# Patient Record
Sex: Female | Born: 1935 | Race: White | Hispanic: No | Marital: Married | Smoking: Never smoker
Health system: Southern US, Community
[De-identification: ages and names within clinical notes are randomized; demographics above are authoritative.]

## PROBLEM LIST (undated history)

## (undated) DIAGNOSIS — I639 Cerebral infarction, unspecified: Secondary | ICD-10-CM

## (undated) DIAGNOSIS — K219 Gastro-esophageal reflux disease without esophagitis: Secondary | ICD-10-CM

## (undated) DIAGNOSIS — M545 Low back pain, unspecified: Secondary | ICD-10-CM

## (undated) DIAGNOSIS — I5022 Chronic systolic (congestive) heart failure: Secondary | ICD-10-CM

## (undated) DIAGNOSIS — E785 Hyperlipidemia, unspecified: Secondary | ICD-10-CM

## (undated) DIAGNOSIS — R300 Dysuria: Secondary | ICD-10-CM

## (undated) DIAGNOSIS — I471 Supraventricular tachycardia, unspecified: Secondary | ICD-10-CM

## (undated) DIAGNOSIS — I10 Essential (primary) hypertension: Secondary | ICD-10-CM

## (undated) DIAGNOSIS — F32A Depression, unspecified: Secondary | ICD-10-CM

## (undated) DIAGNOSIS — F0150 Vascular dementia without behavioral disturbance: Secondary | ICD-10-CM

## (undated) DIAGNOSIS — E7800 Pure hypercholesterolemia, unspecified: Secondary | ICD-10-CM

## (undated) DIAGNOSIS — I495 Sick sinus syndrome: Secondary | ICD-10-CM

## (undated) DIAGNOSIS — M1990 Unspecified osteoarthritis, unspecified site: Secondary | ICD-10-CM

## (undated) DIAGNOSIS — G459 Transient cerebral ischemic attack, unspecified: Secondary | ICD-10-CM

## (undated) DIAGNOSIS — F419 Anxiety disorder, unspecified: Secondary | ICD-10-CM

## (undated) DIAGNOSIS — I480 Paroxysmal atrial fibrillation: Secondary | ICD-10-CM

## (undated) DIAGNOSIS — E0590 Thyrotoxicosis, unspecified without thyrotoxic crisis or storm: Secondary | ICD-10-CM

## (undated) DIAGNOSIS — I2510 Atherosclerotic heart disease of native coronary artery without angina pectoris: Secondary | ICD-10-CM

## (undated) DIAGNOSIS — E663 Overweight: Secondary | ICD-10-CM

## (undated) DIAGNOSIS — I482 Chronic atrial fibrillation, unspecified: Secondary | ICD-10-CM

## (undated) DIAGNOSIS — R413 Other amnesia: Secondary | ICD-10-CM

## (undated) DIAGNOSIS — Z950 Presence of cardiac pacemaker: Secondary | ICD-10-CM

## (undated) DIAGNOSIS — R42 Dizziness and giddiness: Secondary | ICD-10-CM

## (undated) DIAGNOSIS — F329 Major depressive disorder, single episode, unspecified: Secondary | ICD-10-CM

## (undated) DIAGNOSIS — K644 Residual hemorrhoidal skin tags: Secondary | ICD-10-CM

## (undated) DIAGNOSIS — I428 Other cardiomyopathies: Secondary | ICD-10-CM

## (undated) HISTORY — DX: Low back pain: M54.5

## (undated) HISTORY — DX: Depression, unspecified: F32.A

## (undated) HISTORY — DX: Atherosclerotic heart disease of native coronary artery without angina pectoris: I25.10

## (undated) HISTORY — DX: Low back pain, unspecified: M54.50

## (undated) HISTORY — DX: Vascular dementia, unspecified severity, without behavioral disturbance, psychotic disturbance, mood disturbance, and anxiety: F01.50

## (undated) HISTORY — DX: Dysuria: R30.0

## (undated) HISTORY — DX: Gastro-esophageal reflux disease without esophagitis: K21.9

## (undated) HISTORY — DX: Presence of cardiac pacemaker: Z95.0

## (undated) HISTORY — DX: Essential (primary) hypertension: I10

## (undated) HISTORY — DX: Chronic systolic (congestive) heart failure: I50.22

## (undated) HISTORY — DX: Other amnesia: R41.3

## (undated) HISTORY — DX: Unspecified osteoarthritis, unspecified site: M19.90

## (undated) HISTORY — DX: Supraventricular tachycardia, unspecified: I47.10

## (undated) HISTORY — DX: Chronic atrial fibrillation, unspecified: I48.20

## (undated) HISTORY — DX: Cerebral infarction, unspecified: I63.9

## (undated) HISTORY — DX: Paroxysmal atrial fibrillation: I48.0

## (undated) HISTORY — PX: TOTAL ABDOMINAL HYSTERECTOMY W/ BILATERAL SALPINGOOPHORECTOMY: SHX83

## (undated) HISTORY — DX: Overweight: E66.3

## (undated) HISTORY — DX: Supraventricular tachycardia: I47.1

## (undated) HISTORY — DX: Residual hemorrhoidal skin tags: K64.4

## (undated) HISTORY — DX: Sick sinus syndrome: I49.5

## (undated) HISTORY — DX: Dizziness and giddiness: R42

## (undated) HISTORY — DX: Major depressive disorder, single episode, unspecified: F32.9

## (undated) HISTORY — DX: Hyperlipidemia, unspecified: E78.5

## (undated) HISTORY — DX: Pure hypercholesterolemia, unspecified: E78.00

## (undated) HISTORY — DX: Anxiety disorder, unspecified: F41.9

## (undated) HISTORY — DX: Other cardiomyopathies: I42.8

## (undated) HISTORY — DX: Thyrotoxicosis, unspecified without thyrotoxic crisis or storm: E05.90

## (undated) HISTORY — DX: Transient cerebral ischemic attack, unspecified: G45.9

---

## 1984-06-19 HISTORY — PX: ABDOMINAL HYSTERECTOMY: SHX81

## 1997-06-19 HISTORY — PX: OTHER SURGICAL HISTORY: SHX169

## 2000-06-19 HISTORY — PX: TOTAL KNEE ARTHROPLASTY: SHX125

## 2007-11-29 ENCOUNTER — Inpatient Hospital Stay (HOSPITAL_COMMUNITY): Admission: EM | Admit: 2007-11-29 | Discharge: 2007-12-05 | Payer: Self-pay | Admitting: Emergency Medicine

## 2007-12-02 ENCOUNTER — Ambulatory Visit: Payer: Self-pay | Admitting: Physical Medicine & Rehabilitation

## 2007-12-19 ENCOUNTER — Encounter: Admission: RE | Admit: 2007-12-19 | Discharge: 2007-12-19 | Payer: Self-pay | Admitting: Geriatric Medicine

## 2007-12-26 ENCOUNTER — Encounter: Admission: RE | Admit: 2007-12-26 | Discharge: 2007-12-26 | Payer: Self-pay | Admitting: Geriatric Medicine

## 2009-06-16 ENCOUNTER — Encounter: Admission: RE | Admit: 2009-06-16 | Discharge: 2009-06-16 | Payer: Self-pay | Admitting: Internal Medicine

## 2009-06-19 HISTORY — PX: PACEMAKER INSERTION: SHX728

## 2009-08-10 ENCOUNTER — Encounter (HOSPITAL_COMMUNITY)
Admission: RE | Admit: 2009-08-10 | Discharge: 2009-11-08 | Payer: Self-pay | Source: Home / Self Care | Admitting: Endocrinology

## 2009-10-06 ENCOUNTER — Encounter: Payer: Self-pay | Admitting: Endocrinology

## 2009-10-06 LAB — CONVERTED CEMR LAB
AST: 17 units/L
Albumin: 4.4 g/dL
Alkaline Phosphatase: 61 units/L
Basophils Relative: 0 %
CO2: 29 meq/L
Calcium: 9.3 mg/dL
Creatinine, Ser: 0.84 mg/dL
GFR calc Af Amer: 79 mL/min
GFR calc non Af Amer: 68 mL/min
Lymphocytes, automated: 3.4 %
MCV: 91 fL
Neutrophils Relative %: 3.7 %
Platelets: 277 10*3/uL
Potassium: 4.3 meq/L
RBC: 4.57 M/uL
Sodium: 142 meq/L
Triglyceride fasting, serum: 129 mg/dL
Vit D, 25-Hydroxy: 41.1 ng/mL
Vitamin B-12: 633 pg/mL
WBC: 7.8 10*3/uL

## 2009-10-12 ENCOUNTER — Ambulatory Visit: Payer: Self-pay | Admitting: Cardiovascular Disease

## 2009-10-12 ENCOUNTER — Inpatient Hospital Stay (HOSPITAL_COMMUNITY): Admission: EM | Admit: 2009-10-12 | Discharge: 2009-10-14 | Payer: Self-pay | Admitting: Emergency Medicine

## 2009-10-13 ENCOUNTER — Encounter (INDEPENDENT_AMBULATORY_CARE_PROVIDER_SITE_OTHER): Payer: Self-pay | Admitting: Internal Medicine

## 2009-10-13 ENCOUNTER — Ambulatory Visit: Payer: Self-pay | Admitting: Vascular Surgery

## 2009-10-19 ENCOUNTER — Encounter: Admission: RE | Admit: 2009-10-19 | Discharge: 2009-10-19 | Payer: Self-pay | Admitting: Internal Medicine

## 2009-11-04 ENCOUNTER — Inpatient Hospital Stay (HOSPITAL_COMMUNITY): Admission: EM | Admit: 2009-11-04 | Discharge: 2009-11-12 | Payer: Self-pay | Admitting: Emergency Medicine

## 2009-11-04 ENCOUNTER — Ambulatory Visit: Payer: Self-pay | Admitting: Cardiology

## 2009-11-10 ENCOUNTER — Encounter: Payer: Self-pay | Admitting: Internal Medicine

## 2009-11-22 ENCOUNTER — Encounter: Payer: Self-pay | Admitting: Internal Medicine

## 2009-11-24 ENCOUNTER — Encounter: Payer: Self-pay | Admitting: Internal Medicine

## 2009-11-24 ENCOUNTER — Ambulatory Visit: Payer: Self-pay | Admitting: Internal Medicine

## 2010-01-13 ENCOUNTER — Encounter (HOSPITAL_COMMUNITY): Admission: RE | Admit: 2010-01-13 | Discharge: 2010-03-18 | Payer: Self-pay | Admitting: Cardiology

## 2010-03-09 ENCOUNTER — Ambulatory Visit: Payer: Self-pay | Admitting: Cardiology

## 2010-03-19 ENCOUNTER — Encounter (HOSPITAL_COMMUNITY): Admission: RE | Admit: 2010-03-19 | Discharge: 2010-05-18 | Payer: Self-pay | Admitting: Cardiology

## 2010-05-05 ENCOUNTER — Encounter: Payer: Self-pay | Admitting: Endocrinology

## 2010-05-11 ENCOUNTER — Encounter: Payer: Self-pay | Admitting: Endocrinology

## 2010-05-18 ENCOUNTER — Encounter: Payer: Self-pay | Admitting: Endocrinology

## 2010-05-20 ENCOUNTER — Ambulatory Visit: Payer: Self-pay | Admitting: Endocrinology

## 2010-05-20 DIAGNOSIS — M545 Low back pain: Secondary | ICD-10-CM

## 2010-05-20 DIAGNOSIS — E7800 Pure hypercholesterolemia, unspecified: Secondary | ICD-10-CM | POA: Insufficient documentation

## 2010-05-20 DIAGNOSIS — F329 Major depressive disorder, single episode, unspecified: Secondary | ICD-10-CM

## 2010-05-20 DIAGNOSIS — R413 Other amnesia: Secondary | ICD-10-CM | POA: Insufficient documentation

## 2010-05-20 DIAGNOSIS — Z950 Presence of cardiac pacemaker: Secondary | ICD-10-CM | POA: Insufficient documentation

## 2010-05-20 DIAGNOSIS — M810 Age-related osteoporosis without current pathological fracture: Secondary | ICD-10-CM | POA: Insufficient documentation

## 2010-05-20 DIAGNOSIS — N39498 Other specified urinary incontinence: Secondary | ICD-10-CM

## 2010-05-20 DIAGNOSIS — I6350 Cerebral infarction due to unspecified occlusion or stenosis of unspecified cerebral artery: Secondary | ICD-10-CM | POA: Insufficient documentation

## 2010-05-20 DIAGNOSIS — F3289 Other specified depressive episodes: Secondary | ICD-10-CM | POA: Insufficient documentation

## 2010-05-20 DIAGNOSIS — E0590 Thyrotoxicosis, unspecified without thyrotoxic crisis or storm: Secondary | ICD-10-CM | POA: Insufficient documentation

## 2010-05-20 DIAGNOSIS — I10 Essential (primary) hypertension: Secondary | ICD-10-CM | POA: Insufficient documentation

## 2010-05-23 ENCOUNTER — Inpatient Hospital Stay (HOSPITAL_COMMUNITY)
Admission: RE | Admit: 2010-05-23 | Discharge: 2010-05-26 | Payer: Self-pay | Source: Home / Self Care | Attending: Cardiovascular Disease | Admitting: Cardiovascular Disease

## 2010-05-23 HISTORY — PX: CARDIAC CATHETERIZATION: SHX172

## 2010-06-02 ENCOUNTER — Ambulatory Visit: Payer: Self-pay | Admitting: Cardiology

## 2010-06-16 ENCOUNTER — Ambulatory Visit: Payer: Self-pay | Admitting: Cardiology

## 2010-07-19 NOTE — Miscellaneous (Signed)
Summary: Device preload  Clinical Lists Changes  Observations: Added new observation of PPM INDICATN: Sick sinus syndrome (11/22/2009 12:14) Added new observation of MAGNET RTE: BOL 85 ERI  65 (11/22/2009 12:14) Added new observation of PPMLEADSTAT2: active (11/22/2009 12:14) Added new observation of PPMLEADSER2: [id number] (11/22/2009 12:14) Added new observation of PPMLEADMOD2: 5092  (11/22/2009 12:14) Added new observation of PPMLEADLOC2: RV  (11/22/2009 12:14) Added new observation of PPMLEADSTAT1: active  (11/22/2009 12:14) Added new observation of PPMLEADSER1: [id number]  (11/22/2009 12:14) Added new observation of PPMLEADMOD1: 5076  (11/22/2009 12:14) Added new observation of PPMLEADLOC1: RA  (11/22/2009 12:14) Added new observation of PPM IMP MD: Hillis Range, MD  (11/22/2009 12:14) Added new observation of PPMLEADDOI2: 11/10/2009  (11/22/2009 12:14) Added new observation of PPMLEADDOI1: 11/10/2009  (11/22/2009 12:14) Added new observation of PPM DOI: 11/10/2009  (11/22/2009 12:14) Added new observation of PPM SERL#: [id number]  (11/22/2009 12:14) Added new observation of PPM MODL#: ADDRL1  (11/22/2009 96:29) Added new observation of PACEMAKERMFG: Medtronic  (11/22/2009 12:14) Added new observation of PACEMAKER MD: Hillis Range, MD  (11/22/2009 12:14)      PPM Specifications Following MD:  Hillis Range, MD     PPM Vendor:  Medtronic     PPM Model Number:  ADDRL1     PPM Serial Number:  [id number] PPM DOI:  11/10/2009     PPM Implanting MD:  Hillis Range, MD  Lead 1    Location: RA     DOI: 11/10/2009     Model #: [id number]     Serial #: [id number]     Status: active Lead 2    Location: RV     DOI: 11/10/2009     Model #: [id number]     Serial #: [id number]     Status: active  Magnet Response Rate:  BOL 85 ERI  65  Indications:  Sick sinus syndrome

## 2010-07-19 NOTE — Letter (Signed)
Summary: Medication & Problem List/[facility]  Medication & Problem List/[facility]   Imported By: Sherian Rein 05/25/2010 11:23:57  _____________________________________________________________________  External Attachment:    Type:   Image     Comment:   External Document

## 2010-07-19 NOTE — Cardiovascular Report (Signed)
Summary: Office Visit   Office Visit   Imported By: Roderic Ovens 12/10/2009 09:52:45  _____________________________________________________________________  External Attachment:    Type:   Image     Comment:   External Document

## 2010-07-19 NOTE — Letter (Signed)
Summary: [facility]   Imported By: Sherian Rein 05/25/2010 11:25:01  _____________________________________________________________________  External Attachment:    Type:   Image     Comment:   External Document

## 2010-07-19 NOTE — Procedures (Signed)
Summary: wch/ gd   PPM Specifications Following MD:  Hillis Range, MD     PPM Vendor:  Medtronic     PPM Model Number:  ADDRL1     PPM Serial Number:  [id number] PPM DOI:  11/10/2009     PPM Implanting MD:  Hillis Range, MD  Lead 1    Location: RA     DOI: 11/10/2009     Model #: [id number]     Serial #: [id number]     Status: active Lead 2    Location: RV     DOI: 11/10/2009     Model #: [id number]     Serial #: [id number]     Status: active  Magnet Response Rate:  BOL 85 ERI  65  Indications:  Sick sinus syndrome   PPM Follow Up Remote Check?  No Battery Voltage:  2.79 V     Battery Est. Longevity:  11.5 YEARS     Pacer Dependent:  No       PPM Device Measurements Atrium  Amplitude: 2.8 mV, Impedance: 485 ohms, Threshold: 0.625 V at 0.4 msec Right Ventricle  Amplitude: 11.2 mV, Impedance: 634 ohms, Threshold: 0.5 V at 0.4 msec  Episodes MS Episodes:  7     Percent Mode Switch:  <0.1%     Ventricular High Rate:  0     Atrial Pacing:  70.9%     Ventricular Pacing:  0.9%  Parameters Mode:  MVP (R)     Lower Rate Limit:  60     Upper Rate Limit:  130 Paced AV Delay:  150     Sensed AV Delay:  120 Next Cardiology Appt Due:  02/17/2010 Tech Comments:  Wound check appt.  Steri-strips removed.  Wound without redness or edema.  Normal device function.  Longest mode switch episode 12 minutes with relatively well controlled ventricular rate.  No changes made today.  ROV 3 months JA. Gypsy Balsam RN BSN  November 29, 2009 11:43 AM

## 2010-07-19 NOTE — Assessment & Plan Note (Signed)
Summary: New Endo/low thyroid/Patel/Medicare/cd   Vital Signs:  Patient profile:   77 year old female Height:      62 inches (157.48 cm) Weight:      166 pounds (75.45 kg) BMI:     30.47 O2 Sat:      98 % on Room air Temp:     97.8 degrees F (36.56 degrees C) oral Pulse rate:   84 / minute Pulse rhythm:   regular BP sitting:   110 / 62  (left arm) Cuff size:   regular  Vitals Entered By: Brenton Grills CMA Duncan Dull) (May 20, 2010 2:38 PM)  O2 Flow:  Room air CC: New Endo Consult/Low Thyroid Levels/Dr. Allena Katz Is Patient Diabetic? No   Referring Provider:  [name] MD Primary Provider:  [name] MD  CC:  New Endo Consult/Low Thyroid Levels/Dr. Allena Katz.  History of Present Illness: pt was dx'ed with hyperthyroidism in early 2011.  she was rx'ed with tapazole, x approx 4 months.  this was stopped approx june, 2011, due to improvements in her labs.  she has been off the medication since then.  she does not want to have i-131 therapy. son has dpahc, and wants to honor her wishes. symptomatically, pt has 3 years of slight tremor of the hands, and assoc fatigue.  Current Medications (verified): 1)  Lisinopril 20 Mg Tabs (Lisinopril) .Marland Kitchen.. 1 Tablet By Mouth Once Daily 2)  Simvastatin 80 Mg Tabs (Simvastatin) .Marland Kitchen.. 1 Tablet By Mouth Once Daily 3)  Spironolactone 25 Mg Tabs (Spironolactone) .Marland Kitchen.. 1 Tablet By Mouth Once Daily 4)  Metoprolol Tartrate 50 Mg Tabs (Metoprolol Tartrate) .Marland Kitchen.. 1 Tablet By Mouth Two Times A Day 5)  Furosemide 40 Mg Tabs (Furosemide) .Marland Kitchen.. 1 Tablet By Mouth Once Daily 6)  Isosorbide Mononitrate Cr 60 Mg Xr24h-Tab (Isosorbide Mononitrate) .Marland Kitchen.. 1 By Mouth Once Daily 7)  Calcium Carbonate-Vitamin D 600-400 Mg-Unit Tabs (Calcium Carbonate-Vitamin D) .Marland Kitchen.. 1 Tablet By Mouth Two Times A Day 8)  Alendronate Sodium 70 Mg Tabs (Alendronate Sodium) .Marland Kitchen.. 1 Tablet By Mouth Every Week 9)  Nitrostat 0.4 Mg Subl (Nitroglycerin) .Marland Kitchen.. 1 Tablet Under The Tongue As Needed For  Chest Pain 10)  Polyethylene Glycol 3350  Powd (Polyethylene Glycol 3350) .... 1/2 Capful With 8oz of Water As Needed For Constipation 11)  Aspirin 81 Mg Tbec (Aspirin) .Marland Kitchen.. 1 By Mouth Once Daily 12)  Centrum Silver  Tabs (Multiple Vitamins-Minerals) .Marland Kitchen.. 1 Tablet By Mouth Once Daily 13)  Vitamin C 500 Mg Tabs (Ascorbic Acid) .Marland Kitchen.. 1 Tablet By Mouth Once Daily 14)  Plavix 75 Mg Tabs (Clopidogrel Bisulfate) .Marland Kitchen.. 1 Tablet By Mouth Once Daily  Allergies (verified): 1)  ! * Hazelnuts 2)  ! Iodine 3)  ! * Contrast Dye 4)  ! * Peanuts  Past History:  Past Medical History: BACK PAIN, LUMBAR (ICD-724.2) OTHER URINARY INCONTINENCE (ICD-788.39) MEMORY LOSS (ICD-780.93) DEPRESSION (ICD-311) HYPERTENSION (ICD-401.9) CVA (ICD-434.91) CHF (ICD-428.0) CAD (ICD-414.00) PACEMAKER, PERMANENT (ICD-V45.01) OSTEOPOROSIS (ICD-733.00) HYPERCHOLESTEROLEMIA (ICD-272.0) HYPERTHYROIDISM (ICD-242.90) FAMILY HISTORY DIABETES 1ST DEGREE RELATIVE (ICD-V18.0)  Past Surgical History: Pacemaker: 2011 Total Knee Arthroplasty-Bilateral-2002 Hysterectomy-1986  Family History: Family History of Arthritis Family History Diabetes 1st degree relative Family History Hypertension dtr has uncertain type of thyroid problem  Social History: Reviewed history and no changes required. married retired lives with husband, son and his family  Review of Systems       denies weight loss, headache, hoarseness, double vision, palpitations, sob, diarrhea, polyuria, myalgias, excessive diaphoresis, numbness, seizure, anxiety, hypoglycemia,  and rhinorrhea.  she attributes easy bruising to asa and plavix  Physical Exam  General:  normal appearance.   Head:  head: no deformity eyes: no periorbital swelling, no proptosis external nose and ears are normal mouth: no lesion seen Neck:  thyroid is not enlarged.  no palpable nodule Chest Wall:  there is moderate kyphosis Lungs:  Clear to auscultation bilaterally. Normal  respiratory effort.  Heart:  Regular rate and rhythm without murmurs or gallops noted. Normal S1,S2.   Msk:  muscle bulk and strength are grossly normal.  no obvious joint swelling.  gait is slow, but steady. with a cane Extremities:  no deformity, except oa changes no edema Neurologic:  cn 2-12 grossly intact.   readily moves all 4's.   sensation is intact to touch on the feet there is a slight tremor of the hands  Skin:  normal texture and temp.  no rash.  not diaphoretic  Cervical Nodes:  No significant adenopathy.  Psych:  Alert and cooperative; normal mood and affect; normal attention span and concentration.   Additional Exam:  outside test results are reviewed:  fewb, 2011: THYROID SCAN AND UPTAKE - 24 HOURS 1.  Lower limits of normal 24 hour I-131 uptake. 2.  Normal thyroid scan.  may, 2011: Thyroid Stimulating Hormone (TSH)        0.335    nov, 2011: tsh=0.19   Impression & Recommendations:  Problem # 1:  HYPERTHYROIDISM (ICD-242.90) prob due to small multinodular goiter pt declines i-131 rx.    Problem # 2:  MEMORY LOSS (ICD-780.93) this limits eval of sxs  Problem # 3:  tremor caused by, or exac by #1  Medications Added to Medication List This Visit: 1)  Plavix 75 Mg Tabs (Clopidogrel bisulfate) .Marland Kitchen.. 1 tablet by mouth once daily 2)  Methimazole 5 Mg Tabs (Methimazole) .Marland Kitchen.. 1 tab three times weekly (m, w, f)  Other Orders: New Patient Level IV (40347)  Patient Instructions: 1)  resume methimazole at 5 mg three times weekly (m, w, f). 2)  if ever you have fever while taking this medication, stop it and call us, because of the risk of a rare side-effect. 3)  Please schedule a follow-up appointment in 3 months. Prescriptions: METHIMAZOLE 5 MG TABS (METHIMAZOLE) 1 tab three times weekly (m, w, f)  #15 x 4   Entered and Authorized by:   Minus Breeding MD   Signed by:   Minus Breeding MD on 05/20/2010   Method used:   Electronically to        [facility] (retail)       [address]       [place], [place]  [address]       Ph: [phone] or [phone]       Fax: [phone]   RxID:   [id number]    Orders Added: 1)  New Patient Level IV [23762]   Immunization History:  Tetanus/Td Immunization History:    Tetanus/Td:  historical (06/19/2001)  Pneumovax Immunization History:    Pneumovax:  historical (06/19/2001)   Immunization History:  Tetanus/Td Immunization History:    Tetanus/Td:  Historical (06/19/2001)  Pneumovax Immunization History:    Pneumovax:  Historical (06/19/2001)  Preventive Care Screening  Colonoscopy:    Date:  06/19/2001    Results:  normal

## 2010-08-20 ENCOUNTER — Emergency Department (HOSPITAL_COMMUNITY): Payer: Medicare Other

## 2010-08-20 ENCOUNTER — Inpatient Hospital Stay (HOSPITAL_COMMUNITY)
Admission: EM | Admit: 2010-08-20 | Discharge: 2010-08-25 | DRG: 481 | Disposition: A | Discharge status: Skilled Nursing Facility | Payer: Medicare Other | Attending: Internal Medicine | Admitting: Internal Medicine

## 2010-08-20 DIAGNOSIS — I10 Essential (primary) hypertension: Secondary | ICD-10-CM | POA: Diagnosis present

## 2010-08-20 DIAGNOSIS — Z950 Presence of cardiac pacemaker: Secondary | ICD-10-CM

## 2010-08-20 DIAGNOSIS — I252 Old myocardial infarction: Secondary | ICD-10-CM

## 2010-08-20 DIAGNOSIS — S72009A Fracture of unspecified part of neck of unspecified femur, initial encounter for closed fracture: Secondary | ICD-10-CM

## 2010-08-20 DIAGNOSIS — I5032 Chronic diastolic (congestive) heart failure: Secondary | ICD-10-CM | POA: Diagnosis present

## 2010-08-20 DIAGNOSIS — I509 Heart failure, unspecified: Secondary | ICD-10-CM | POA: Diagnosis present

## 2010-08-20 DIAGNOSIS — D62 Acute posthemorrhagic anemia: Secondary | ICD-10-CM | POA: Diagnosis not present

## 2010-08-20 DIAGNOSIS — W010XXA Fall on same level from slipping, tripping and stumbling without subsequent striking against object, initial encounter: Secondary | ICD-10-CM | POA: Diagnosis present

## 2010-08-20 DIAGNOSIS — I2510 Atherosclerotic heart disease of native coronary artery without angina pectoris: Secondary | ICD-10-CM | POA: Diagnosis present

## 2010-08-20 DIAGNOSIS — M810 Age-related osteoporosis without current pathological fracture: Secondary | ICD-10-CM | POA: Diagnosis present

## 2010-08-20 DIAGNOSIS — Y9210 Unspecified residential institution as the place of occurrence of the external cause: Secondary | ICD-10-CM | POA: Diagnosis present

## 2010-08-20 DIAGNOSIS — Z8673 Personal history of transient ischemic attack (TIA), and cerebral infarction without residual deficits: Secondary | ICD-10-CM

## 2010-08-20 DIAGNOSIS — S72143A Displaced intertrochanteric fracture of unspecified femur, initial encounter for closed fracture: Principal | ICD-10-CM | POA: Diagnosis present

## 2010-08-20 DIAGNOSIS — I2119 ST elevation (STEMI) myocardial infarction involving other coronary artery of inferior wall: Secondary | ICD-10-CM

## 2010-08-20 DIAGNOSIS — E0590 Thyrotoxicosis, unspecified without thyrotoxic crisis or storm: Secondary | ICD-10-CM | POA: Diagnosis present

## 2010-08-20 DIAGNOSIS — Z9861 Coronary angioplasty status: Secondary | ICD-10-CM

## 2010-08-20 LAB — CBC
HCT: 36.4 % (ref 36.0–46.0)
MCH: 29.7 pg (ref 26.0–34.0)
MCHC: 32.7 g/dL (ref 30.0–36.0)
MCV: 90.8 fL (ref 78.0–100.0)
RDW: 13.7 % (ref 11.5–15.5)

## 2010-08-20 LAB — BASIC METABOLIC PANEL
BUN: 17 mg/dL (ref 6–23)
CO2: 28 mEq/L (ref 19–32)
Chloride: 105 mEq/L (ref 96–112)
Glucose, Bld: 149 mg/dL — ABNORMAL HIGH (ref 70–99)
Potassium: 4 mEq/L (ref 3.5–5.1)

## 2010-08-20 LAB — DIFFERENTIAL
Basophils Absolute: 0 10*3/uL (ref 0.0–0.1)
Eosinophils Relative: 2 % (ref 0–5)
Lymphocytes Relative: 38 % (ref 12–46)
Lymphs Abs: 2.9 10*3/uL (ref 0.7–4.0)
Monocytes Absolute: 0.4 10*3/uL (ref 0.1–1.0)
Monocytes Relative: 6 % (ref 3–12)

## 2010-08-20 LAB — URINALYSIS, ROUTINE W REFLEX MICROSCOPIC
Bilirubin Urine: NEGATIVE
Nitrite: NEGATIVE
Protein, ur: NEGATIVE mg/dL
Specific Gravity, Urine: 1.017 (ref 1.005–1.030)
Urobilinogen, UA: 0.2 mg/dL (ref 0.0–1.0)

## 2010-08-20 LAB — TSH: TSH: 0.198 u[IU]/mL — ABNORMAL LOW (ref 0.350–4.500)

## 2010-08-21 ENCOUNTER — Inpatient Hospital Stay (HOSPITAL_COMMUNITY): Payer: Medicare Other

## 2010-08-21 DIAGNOSIS — I2510 Atherosclerotic heart disease of native coronary artery without angina pectoris: Secondary | ICD-10-CM

## 2010-08-21 LAB — URINE CULTURE
Colony Count: NO GROWTH
Culture  Setup Time: 201203031904
Culture: NO GROWTH

## 2010-08-21 LAB — CBC
HCT: 31 % — ABNORMAL LOW (ref 36.0–46.0)
Hemoglobin: 9.8 g/dL — ABNORMAL LOW (ref 12.0–15.0)
MCH: 29.6 pg (ref 26.0–34.0)
MCHC: 31.9 g/dL (ref 30.0–36.0)
MCV: 92.5 fL (ref 78.0–100.0)
RDW: 14 % (ref 11.5–15.5)

## 2010-08-21 LAB — BASIC METABOLIC PANEL
BUN: 12 mg/dL (ref 6–23)
CO2: 29 mEq/L (ref 19–32)
Chloride: 108 mEq/L (ref 96–112)
Creatinine, Ser: 0.93 mg/dL (ref 0.4–1.2)
Glucose, Bld: 109 mg/dL — ABNORMAL HIGH (ref 70–99)
Potassium: 4.5 mEq/L (ref 3.5–5.1)

## 2010-08-21 LAB — SURGICAL PCR SCREEN: MRSA, PCR: NEGATIVE

## 2010-08-22 LAB — CBC
HCT: 27 % — ABNORMAL LOW (ref 36.0–46.0)
MCHC: 32.6 g/dL (ref 30.0–36.0)
Platelets: 128 10*3/uL — ABNORMAL LOW (ref 150–400)
RDW: 14.8 % (ref 11.5–15.5)
WBC: 9.6 10*3/uL (ref 4.0–10.5)

## 2010-08-22 LAB — POCT I-STAT 4, (NA,K, GLUC, HGB,HCT)
Glucose, Bld: 138 mg/dL — ABNORMAL HIGH (ref 70–99)
HCT: 29 % — ABNORMAL LOW (ref 36.0–46.0)
Hemoglobin: 9.9 g/dL — ABNORMAL LOW (ref 12.0–15.0)
Potassium: 4.5 mEq/L (ref 3.5–5.1)
Sodium: 141 mEq/L (ref 135–145)

## 2010-08-22 LAB — BASIC METABOLIC PANEL
Calcium: 7.8 mg/dL — ABNORMAL LOW (ref 8.4–10.5)
GFR calc non Af Amer: 59 mL/min — ABNORMAL LOW (ref >60)
Glucose, Bld: 131 mg/dL — ABNORMAL HIGH (ref 70–99)
Potassium: 4 mEq/L (ref 3.5–5.1)
Sodium: 139 mEq/L (ref 135–145)

## 2010-08-22 NOTE — Consult Note (Signed)
NAMELEATHA, ROHNER              ACCOUNT NO.:  [id number]  MEDICAL RECORD NO.:  [id number]           PATIENT TYPE:  E  LOCATION:  WLED                         FACILITY:  [facility]  PHYSICIAN:  Eulas Post, MD    DATE OF BIRTH:  09/16/1931  DATE OF CONSULTATION:  08/20/2010 DATE OF DISCHARGE:                                CONSULTATION   TIME OF CONSULTATION:  10 a.m.  CHIEF COMPLAINT:  Left hip pain.  HISTORY:  [name] is a 77 year old woman who apparently had a fall today and injured her left hip.  She was found on the floor by her son and was unable to get up.  It is not clear exactly what happened. The thought was that it was a mechanical fall, but her son indicates that she is somewhat of a poor historian and often does not tell the full story.  He is concerned because she has had multiple episodes of mini strokes in the past and questionable blackout.  She complains of acute severe pain directly over the left hip that is worse with movement.  She has no other injuries.  She herself denies that she had any loss of consciousness.  Rest makes it better and pain medication makes it better and activity makes it worse.  PAST HISTORY:  Significant for cardiac disease and in fact has had a myocardial infarction with multiple stents placed in 2011.  She also has a history of congestive failure as well as hypertension, atrial flutter, stroke and hypothyroid disease as well as osteoporosis and is currently taking calcium with vitamin D.  She also has a history of depression.  FAMILY HISTORY:  Substantial for coronary disease with multiple early deaths in the 22s and 63s age range.  SOCIAL HISTORY:  She lives at home with her family and her husband and son and does not smoke and does not drink.  REVIEW OF SYSTEMS:  Complete review of systems was performed and was positive for recent fatigue, but she specifically denies chest pain or recent shortness of breath and  complete review of systems was performed and was otherwise negative with the exception of those mentioned in the history of present illness.  PHYSICAL EXAMINATION:  GENERAL:  She is alert and oriented and is appropriate with me throughout the interaction.  She is well developed, well nourished with mild obesity. HEENT:  She is normocephalic and atraumatic, and I do not see any evidence for head trauma. NECK:  Her trachea is midline, and she has a supple neck with no radiculopathy and no thyromegaly. LUNGS:  She has no cyanosis and no clubbing.  She has good perfusion throughout. CARDIOVASCULAR:  She has no significant pedal edema. GI:  Her abdomen is soft, nontender, nondistended with no rebound or guarding.  She has no hepatosplenomegaly. SKIN:  She has some bruising over her left hip but otherwise her skin exam is normal.  She does have some bruising over her left elbow as well.  There is no cellulitis or evidence of infection. PSYCHIATRIC:  She is appropriate with me throughout the interaction although she does speak [place]  primarily and so we have somewhat of a language barrier, although I was able to communicate with her in Spanish, as well as through her son who is functioning as a Nurse, learning disability. MUSCULOSKELETAL:  Her left hip has moderate deformity with shortening and external rotation.  EHL and FHL are firing.  Her right lower extremity has no evidence for trauma and neither of her upper extremities have any pain to palpation. NEUROLOGIC:  Her sensation is grossly intact throughout both of her lower extremities.  X-rays demonstrate a displaced left intertrochanteric hip fracture.  IMPRESSION:  Unstable displaced left intertrochanteric hip fracture in the setting of recent myocardial infarction with stent placement and multiple other medical comorbidities.  PLAN:  This is an acute severe injury that is going to substantially impact her quality of life.  I have discussed  nonsurgical versus surgical options with the patient and the family, and they have elected for surgical intervention.  She is going to be optimized by the Medical Service and admitted to the [facility] probably with a cardiac consult and preoperative optimization prior to proceeding.  She does take aspirin and Plavix for her stent, and I would recommend that she continue with these and will also need appropriate DVT prophylaxis which would probably be in the form of Lovenox which we will begin postoperatively after 24 hours.  I will defer the management of this to the Medical Service but would recommend at least 3-4 weeks of postoperative DVT prophylaxis.  We can continue with aspirin and Plavix or whatever anticoagulation is indicated based on Cardiology's recommendations.  I have ordered type and cross, and she will likely need blood during her hospitalization given the severe nature of her fracture.  The nature of this injury carries a very high risk for both morbidity as well as mortality and hopefully we will be able to get her back to an ambulatory function and relative independence, although she will certainly need short-term skilled nursing placement.  We are going to plan to have her optimized and to proceed with surgery probably tomorrow if all is in order.  Thank you for this consultation.     Eulas Post, MD     JPL/MEDQ  D:  08/20/2010  T:  08/20/2010  Job:  [id number]  cc:   Colleen Can. Deborah Chalk, M.D. Fax: [phone]  [name], MD  Electronically Signed by Teryl Lucy MD on 08/22/2010 08:30:29 AM

## 2010-08-22 NOTE — Consult Note (Signed)
[name], [name]              ACCOUNT NO.:  [id number]  MEDICAL RECORD NO.:  [id number]           PATIENT TYPE:  E  LOCATION:  WLED                         FACILITY:  [facility]  PHYSICIAN:  Cassell Clement, M.D. DATE OF BIRTH:  09/23/1931  DATE OF CONSULTATION:  08/20/2010 DATE OF DISCHARGE:                                CONSULTATION   CARDIOLOGIST:  Colleen Can. Deborah Chalk, M.D.  PRIMARY CARE PHYSICIAN:  [name], M.D.  Thank you for asking [facility] to see this pleasant 77 year old [place] speaking woman in consultation.  She is a patient of Dr. Roger Shelter for Cardiology.  She has a past history of tachybrady syndrome and underwent implantation of a Medtronic dual-chamber pacemaker in May 2011 by Dr. Hillis Range.  She has a history of ischemic heart disease, and on May 24, 2010, presented with an acute inferior wall myocardial infarction Code STEMI and was taken emergently to the lab where she was found to have a totally occluded right coronary artery with heavy clot burden.  Dr. Tonny Bollman inserted 3 drug-eluting stents into the right coronary artery.  Since that time, the patient has remained on aspirin 325 mg daily and Plavix 75 mg daily.  She has not been experiencing any recent chest pain or angina.  She has a past history of diastolic congestive heart failure but has had no recent symptoms of acute dyspnea or CHF.  Today, she fell and fractured her left hip and comes to the emergency room.  PAST MEDICAL HISTORY: 1. Prior history of cerebrovascular accident. 2. Hyperthyroidism. 3. Chronic congestive diastolic heart failure. 4. Anxiety and depression. 5. Osteoporosis and degenerative joint disease. 6. Tachybrady syndrome as noted above.  ALLERGIES: 1. IODINE. 2. CONTRAST MEDIA. 3. IVP DYE.  SOCIAL HISTORY:  She does not use alcohol or tobacco.  She speaks very little [place].  She lives with her husband and son.  REVIEW OF SYSTEMS:   She denies any cough or sputum production.  She is not having any change in bowel habits.  She does tend to be constipated but does not take MiraLax or stool softeners on a regular basis.  She denies any dysuria.  She does have urinary frequency.  All other systems negative in detail.  CURRENT MEDICATIONS: 1. Methadone 5 mg one 3 times a day. 2. Lisinopril 20 mg twice a day. 3. Plavix 75 mg daily. 4. Simvastatin 40 mg daily. 5. Spironolactone 25 mg daily. 6. Furosemide 40 mg daily. 7. Metoprolol 25 mg b.i.d. 8. Aspirin 325 mg daily.  PHYSICAL EXAMINATION:  VITAL SIGNS:  Her blood pressure is 100/60, pulse is 75, and respirations are 18. GENERAL:  This is an elderly Caucasian female lying flat in no distress. She is slightly pale. SKIN:  Warm and dry. PSYCH:  She is alert and oriented.  She is able to communicate somewhat and her family also helps interpret from the [place]. NECK:  Jugular venous pressure is normal.  Carotids are normal.  Thyroid not enlarged.  There is no lymphadenopathy. CHEST:  Clear to percussion and auscultation. HEART:  Reveals a quiet precordium without murmur, gallop, rub, or  click. ABDOMEN:  Reveals no hepatosplenomegaly or masses.  Bowel sounds are present. EXTREMITIES:  Show no phlebitis or edema.  She has good pedal pulses. The left leg is foreshortened and externally rotated.  RADIOLOGY:  Chest x-ray shows cardiomegaly, low lung volumes, no evidence of acute cardiopulmonary disease or CHF.  DIAGNOSTIC STUDIES:  Her electrocardiogram shows AV paced rhythm.  IMPRESSION: 1. Ischemic heart disease, status post Code STEMI in May 24, 2010, with emergent cardiac cath and insertion of 3 drug-eluting     stents to the right coronary artery. 2. History of tachybrady syndrome with paroxysmal atrial flutter and     with a functioning dual-chamber pacemaker.  The patient is     presently in normal sinus rhythm. 3. History of diastolic congestive  heart failure. 4. Past history of hyperthyroidism.  PLAN:  We will plan to continue her Plavix and her aspirin throughout the hospital course.  We will follow with you.  Thank you for the opportunity to share in this pleasant woman's care.          ______________________________ Cassell Clement, M.D.     TB/MEDQ  D:  08/20/2010  T:  08/20/2010  Job:  [id number]  cc:   Eulas Post, MD Fax: [phone]  [name], MD  Colleen Can. Deborah Chalk, M.D. Fax: [phone]  Electronically Signed by Cassell Clement M.D. on 08/22/2010 06:48:00 PM

## 2010-08-22 NOTE — Op Note (Signed)
NAMEBROOKELIN, FELBER              ACCOUNT NO.:  [id number]  MEDICAL RECORD NO.:  [id number]           PATIENT TYPE:  I  LOCATION:  1427                         FACILITY:  [facility]  PHYSICIAN:  Eulas Post, MD    DATE OF BIRTH:  09-17-1931  DATE OF PROCEDURE:  08/20/2010 DATE OF DISCHARGE:                              OPERATIVE REPORT   FIRST ASSISTANT:  Janace Litten, Orthopedic, PA-C  PREOPERATIVE DIAGNOSIS:  Left unstable intertrochanteric hip fracture.  POSTOPERATIVE DIAGNOSIS:  Left unstable intertrochanteric hip fracture.  OPERATIVE PROCEDURE:  Left hip trochanteric femoral nailing.  ANESTHESIA:  General.  ESTIMATED BLOOD LOSS:  300 cc.  OPERATIVE IMPLANTS:  Synthes size 11 x 340 mm trochanteric femoral nail with a size 105 mm helical blade.  PREOPERATIVE INDICATIONS:  Ms. Jodilyn Giese is a 77 year old woman with multiple medical problems including known osteoporosis as well as coronary artery disease who is status post recent stent placement who fell and had a left intertrochanteric hip fracture.  She elected to undergo the above named procedures.  The risks, benefits, and alternatives were discussed with the patient as well as the family including not limited to risks of infection, bleeding, nerve injury, blood transfusion, blood clots, malunion, nonunion, hardware prominence, hardware failure, need for hardware removal, leg length discrepancy, inability to regain ambulatory function, cardiopulmonary complications, among others and they were willing to proceed.  OPERATIVE PROCEDURE:  The patient was brought to the operating room, placed in the supine position.  IV antibiotics were given.  General anesthesia was administered.  She was placed on the fracture table.  The left lower extremity was prepped and draped in the usual sterile fashion.  The hip was also reduced prior to prepping and draping.  There was substantial displacement as well as comminution of the  greater trochanter, which made reduction and maintenance of alignment extremely difficult.  I was able to get an anatomic reduction as seen on the lateral view, although the neck tended to go anterior while the posterior segment tended to sag.  I placed a guidewire after making an incision proximal to the greater trochanter.  I put the guidewire down first, but in the process of putting the guidewire down the neck, now reduced again.  Therefore, I made a small incision and introduced a Cobb elevator to hold the reduction of the fracture while I placed the guidewire.  Janace Litten was critical for maintaining reduction during this technically fairly challenging procedure.  Once the reduction was held satisfactorily, I introduced a guidewire into the appropriate position, then opened the femur with the reamer.  The fracture site was basically right in line with where the guidewire needed to go, making maintenance of all of the alignment fairly challenging, but nonetheless I opened the proximal femur.  I measured the length of the nail and then I attempted to place the nail by hand, but her cortices were fairly tight and the canal small and so therefore I needed to ream.  I reamed up to a size 12.5.  This provided satisfactory opening of the isthmus, and I placed a size 11 nail.  I  placed this down using mostly by hand and then with finishing taps of the mallet.  The nail, however, would not sit down low enough, and was being blocked by the subtrochanteric region, in order to get the center position on the head with the guidewire.  I confirmed that I was not reduced in a varus position, and it is possible that I was slightly shortened overall, such that the head possibly could have been more superior, however, in doing that I would loose substantial bony contact. For this reason, I accepted that the nail needed to be placed slightly more distally, and she had a very tight proximal  aspect of the femur.  I removed the nail because I could not get it all the way down to an adequate position and then reamed again over the guidewire using the opening reamer, going down into the subtrochanteric region.  I was trying to prepare a more distal seat for the flare of the proximal aspect of the nail.  I then replaced the nail and although placing the guidewire up was not perfectly center on the AP view, and was more superior than I normally would like it, but nevertheless I had to live with this based on the geometry of her proximal femur.  I had the guidewire slightly posterior in the head, in order to reduce risk for anterior cut out.  I then measured the length and then drilled and reamed to prepare the femoral neck and head.  I placed a helical blade and it did not penetrate into the joint.  The bone quality in the head was actually remarkably good.  Actually, had been drilled twice, as the first time when I reached the substance of the femoral head, it was actually distracting fracture, so I drilled that portion.  Once all the implants were in, I had very tight diaphyseal canal fit fill, and did not require an inner locking screw distally.  The fracture was length stable.  I irrigated the wounds copiously and then repaired the fascia with #1 followed by 3-0 for the subcutaneous tissue and routine closure with Steri-Strips and sterile gauze.  She had a moderate amount of blood loss, for this given surgery, due to the fact that she was on both aspirin and Plavix immediately prior to surgery.  She tolerated the procedure well and there were no complications.  This was a fairly challenging nail placement given the instability of her fracture pattern as well as her geometry of her proximal femur.  Janace Litten, Orthopedic PA-C was present and scrubbed throughout the case and critical for reduction as well as exposure and maintenance of position as well as closure.  She  tolerated the procedure well and there were no complications.  I will allow her to be weightbearing as tolerated.  She will also be on Lovenox for DVT prophylaxis and will continue with her aspirin and Plavix per the internal medicine service.     Eulas Post, MD     JPL/MEDQ  D:  08/21/2010  T:  08/22/2010  Job:  [id number]  Electronically Signed by Teryl Lucy MD on 08/22/2010 08:30:32 AM

## 2010-08-23 ENCOUNTER — Ambulatory Visit: Payer: Self-pay | Admitting: Endocrinology

## 2010-08-23 LAB — BASIC METABOLIC PANEL
BUN: 8 mg/dL (ref 6–23)
Chloride: 106 mEq/L (ref 96–112)
Glucose, Bld: 103 mg/dL — ABNORMAL HIGH (ref 70–99)
Potassium: 4.5 mEq/L (ref 3.5–5.1)

## 2010-08-23 LAB — TYPE AND SCREEN: Unit division: 0

## 2010-08-23 LAB — CBC
HCT: 34 % — ABNORMAL LOW (ref 36.0–46.0)
MCV: 90.4 fL (ref 78.0–100.0)
RBC: 3.76 MIL/uL — ABNORMAL LOW (ref 3.87–5.11)
RDW: 15.2 % (ref 11.5–15.5)
WBC: 9.5 10*3/uL (ref 4.0–10.5)

## 2010-08-24 LAB — CBC
MCH: 29.2 pg (ref 26.0–34.0)
MCV: 90.8 fL (ref 78.0–100.0)
Platelets: 159 10*3/uL (ref 150–400)
RDW: 14.9 % (ref 11.5–15.5)

## 2010-08-24 LAB — BASIC METABOLIC PANEL
BUN: 6 mg/dL (ref 6–23)
CO2: 28 mEq/L (ref 19–32)
Chloride: 110 mEq/L (ref 96–112)
Creatinine, Ser: 0.64 mg/dL (ref 0.4–1.2)

## 2010-08-25 LAB — BASIC METABOLIC PANEL
Calcium: 7.8 mg/dL — ABNORMAL LOW (ref 8.4–10.5)
GFR calc Af Amer: >60 mL/min (ref >60)
GFR calc non Af Amer: >60 mL/min (ref >60)
Glucose, Bld: 96 mg/dL (ref 70–99)
Sodium: 138 mEq/L (ref 135–145)

## 2010-08-25 LAB — CBC
MCHC: 31.9 g/dL (ref 30.0–36.0)
RDW: 14.6 % (ref 11.5–15.5)

## 2010-08-27 NOTE — Discharge Summary (Signed)
[name], WILDRICK              ACCOUNT NO.:  [id number]  MEDICAL RECORD NO.:  [id number]           PATIENT TYPE:  I  LOCATION:  1427                         FACILITY:  [facility]  PHYSICIAN:  [facility]      DATE OF BIRTH:  Nov 15, 1931  DATE OF ADMISSION:  08/20/2010 DATE OF DISCHARGE:                              DISCHARGE SUMMARY   DATE OF DISCHARGE:  To be determined.  ADMISSION DIAGNOSES: 1. Left hip fracture. 2. History of coronary artery disease with recent coronary stent     placement. 3. History of hyperthyroidism. 4. Hypertension. 5. History of diastolic congestive heart failure.  DISCHARGE DIAGNOSES: 1. Status post left hip trochanteric femoral nailing. 2. Coronary artery disease, stable. 3. History of tachybrady syndrome with respiratory fibrillation and     functional dual-chamber pacemaker. 4. History of diastolic congestive heart failure. 5. History of hyperthyroidism. 6. Coronary artery disease with recent stent placement.  IMAGING STUDIES: 1. Tests performed in the hospital stay include hip x-ray on August 20, 2010, showed comminuted intertrochanteric fracture of the left     femur. 2. Chest x-ray on August 20, 2010, showed low lung volumes, no acute     cardiopulmonary disease. 3. Hip x-ray on August 21, 2010, showed status post ORIF of the left     proximal femur fracture.  No evidence for immediate harder     complication. 4. Pelvic x-ray on August 21, 2010, showed status post ORIF of the     intertrochanteric left hip fracture. 5. Femur x-ray on August 21, 2010, showed status post ORIF of the left     hip intertrochanteric fracture.  PERTINENT LABORATORY DATA:  Urine culture on August 20, 2010, showed no growth and BMET as of August 23, 2010, shows sodium 139, potassium is 4.5, chloride 106, CO2 of 30, BUN 8, and creatinine 0.70.  The patient's WBC is 9.5, hemoglobin 11.1, hematocrit 34.0, and platelet count of 136.  PHYSICAL EXAMINATION:  VITAL  SIGNS:  As of August 23, 2010, the patient's temperature 98.2, pulse 71, respirations 18, blood pressure 106/76, and O2 sats 99% on 2 L of oxygen. CHEST:  Clear to auscultation bilaterally. HEART:  S1 and S2 regular rate and rhythm. ABDOMEN:  Soft and nontender. EXTREMITIES:  Left hip is in dressing.  No edema noted.  BRIEF HISTORY AND PHYSICAL:  This is a 77 year old [name], was brought to the hospital today as she felt dizzy in the bathroom and fell resulting in fracture of the left hip.  The patient denied loss of consciousness and no chest pain, no shortness of breath, and no other symptoms proceeding the fall and she was seen by Dr. Dion Saucier, orthopedic surgeon for possible surgery.  Because of this significant cardiac history of recent coronary stent placement, a Cardiology consultation was obtained with [facility].  As per Dr. Shelba Flake recommendation, aspirin and Plavix were continued.  BRIEF HOSPITAL COURSE: 1. Left hip fracture.  The patient underwent left hip trochanteric     femoral nailing for the left unstable intertrochanteric hip     fracture and the patient is  doing well after surgery.  The patient     has been started on Lovenox as per ortho recommendations for DVT     prophylaxis and she shall be discharged on the Lovenox to the     nursing home for the next 2-4 weeks as per orthopedic     recommendations.  The patient followed up with orthopedics, Dr.     Dion Saucier as an outpatient. 2. History of CAD with recent coronary stent placement.  The patient     was seen by Cardiology in the hospital and Cardiology recommended     the patient should be continued on aspirin and Plavix during the     surgery as the patient is a high-risk for stent restenosis.  So,     the patient has been informed aspirin and Plavix should be     continued at the time of discharge. 3. Hyperthyroidism.  The patient will be continued on methimazole.     She is taking 5 mg 1  tablet p.o. 3 times a week.  At this time, the     patient has been seen by Physical Therapy and they recommend the     patient to be going to SNF at the time of discharge.  DISCHARGE MEDICATIONS: 1. Lovenox 40 mg subcu 24 hours for 4 weeks. 2. Hydrocodone and acetaminophen 5/325 one to two tabs by mouth daily     4 hours as needed. 3. Methocarbamol 500 mg p.o. every 6 hours as needed. 4. Senna tablets 1 p.o. b.i.d. before meals. 5. Aldactone 25 mg 1 tablet p.o. daily. 6. Alendronate 70 mg 1 tablet p.o. weekly. 7. Aspirin enteric-coated 325 mg 1 tablet p.o. daily. 8. Calcium and vitamin D 600/400 units 1 tablet p.o. daily. 9. Lasix 40 mg p.o. daily. 10.Lisinopril 20 mg 1 tablet p.o. twice a day. 11.Methimazole 5 mg 1 tablet p.o. every Monday, Wednesday, and Friday. 12.Metoprolol tartrate 25 mg half-tablet p.o. twice a day. 13.Multivitamin therapeutic 1 tablet p.o. daily. 14.Plavix 75 mg 1 tablet p.o. daily. 15.Polyethylene glycol 17 g p.o. daily as needed. 16.Simvastatin 40 mg 1 tablet p.o. daily. 17.Vitamin C 500 mg 1 tablet p.o. daily.  DISCHARGE PLAN:  Follow up with Dr. Dion Saucier as outpatient and also __________ in 2-3 weeks and also Dr. [name], the patient's primary care physician.     Mauro Kaufmann, MD   ______________________________ [facility]    GL/MEDQ  D:  08/23/2010  T:  08/23/2010  Job:  [id number]  cc:   [name], MD Eulas Post, MD  Electronically Signed by Mauro Kaufmann  on 08/27/2010 12:13:26 PM

## 2010-08-27 NOTE — H&P (Signed)
  [name], [name]              ACCOUNT NO.:  [id number]  MEDICAL RECORD NO.:  [id number]           PATIENT TYPE:  LOCATION:                                 FACILITY:  PHYSICIAN:  Mauro Kaufmann, MD         DATE OF BIRTH:  10-Feb-1932  DATE OF ADMISSION: DATE OF DISCHARGE:                             HISTORY & PHYSICAL   ADDENDUM:  I called and discussed with Dr. Dion Saucier and as per Dr. Dion Saucier, the patient can be on both aspirin and Plavix for the surgery and there is no need to stop these both.  At this time, I am going to restart the patient on these both medications and we will keep the patient on SCDs at this time for DVT prophylaxis and later after surgery we will put the patient on Lovenox.     Mauro Kaufmann, MD     GL/MEDQ  D:  08/20/2010  T:  08/20/2010  Job:  [id number]  Electronically Signed by Mauro Kaufmann  on 08/27/2010 12:13:37 PM

## 2010-08-27 NOTE — H&P (Signed)
[name], [name]              ACCOUNT NO.:  [id number]  MEDICAL RECORD NO.:  [id number]           PATIENT TYPE:  E  LOCATION:  WLED                         FACILITY:  [facility]  PHYSICIAN:  Mauro Kaufmann, MD         DATE OF BIRTH:  [birth year]  DATE OF ADMISSION:  08/20/2010 DATE OF DISCHARGE:                             HISTORY & PHYSICAL   PRIMARY CARE PHYSICIAN:  Shannon Durand, MD  CHIEF COMPLAINT:  Status post fall and left hip pain.  HISTORY OF PRESENT ILLNESS:  This is a 77 year old [name] who was brought to the hospital today as the patient felt dizzy in the bathroom and fell resulting in fracture of the left hip.  The patient denied any loss of consciousness and no chest pain or shortness of breath.  No other symptoms preceding the fall.  The patient is going to be seen by Dr. Dion Holt, orthopedic surgeon for the possible surgery. The patient has a significant cardiac history and had recent cardiac cath done in December 2011.  At that time, she had total occlusion of the right coronary artery with a heavy thrombosed burden, diffuse left anterior descending and left circumflex plaquing with associated ectasia, but no high risk of high-grade stenosis, mild segmental left ventricular segmental dysfunction, and had successful primary percutaneous intervention under intravascular ultrasound guidance using large overlapping drug-eluting stents.  The patient had inferior ST elevation MI in December and had 3 stents which were drug-eluting stents requiring both aspirin and Plavix.  At this time, the patient denies any chest pain.  No nausea, vomiting, or diarrhea.  The patient's pain is well controlled at this time.  PAST MEDICAL HISTORY:  Significant for: 1. CAD. 2. Non-ST-elevation MI in May 2011. 3. History of tachybrady syndrome with atrial flutter and syncope,     status post [employer] Adapta pacemaker in May 2011. 4. History of CVA. 5. Hyperthyroidism. 6.  Chronic diastolic congestive heart failure. 7. Anxiety/depression. 8. History of vertigo. 9. Osteoporosis and degenerative joint disease. 10.Status post hysterectomy. 11.Status post knee surgery.  ALLERGIES:  The patient is allergic to: 1. IODINE. 2. CONTRAST MEDIA, IVP DYE.  FAMILY HISTORY:  Her mother died at 85 and father died at 61, both had CAD, but no other siblings have CAD.  SOCIAL HISTORY:  The patient lives with her husband and son. No history of alcohol abuse.  No history of tobacco abuse.  No history of illicit drug abuse.  CURRENT MEDICATIONS: 1. Methadone 5 mg 1 p.o. t.i.d. 3 times a week. 2. Lisinopril 20 mg p.o. b.i.d. 3. Plavix 75 mg p.o. daily. 4. Simvastatin 40 mg p.o. daily. 5. Spironolactone 25 mg p.o. daily. 6. Furosemide 40 mg p.o. daily. 7. Metoprolol 25 mg p.o. b.i.d. 8. Aspirin 325 mg p.o. daily.  REVIEW OF SYSTEMS:  HEENT:  There is no headache, no blurred vision, no runny nose, no sore throat.  NECK:  Positive history of hyperthyroidism. CHEST:  No history of COPD.  No coughing, no shortness of breath. HEART:  As in the HPI.  GENITOURINARY:  No dysuria, urgency, or frequency of urination, but  the patient does have a history of mild incontinence as per the son.  NEUROLOGICAL:  Positive history of stroke in the past with no sequelae.  PHYSICAL EXAMINATION:  VITAL SIGNS:  The patient's blood pressure at this time is 103/61, temp is 97.0, pulse 75, respirations 18, and O2 sats 94% on room air. HEENT:  Head is atraumatic and normocephalic.  Eyes, extraocular muscles are intact.  Oral mucosa is moist. NECK:  Supple. CHEST:  Clear to auscultation bilaterally.  No crackles auscultated. HEART:  S1 and S2 is regular in rate and rhythm. ABDOMEN:  Soft and nontender.  No hepatosplenomegaly. EXTREMITIES:  No cyanosis, clubbing, or edema noted.  The left lower extremity is externally rotated with no ecchymosis.  Mild tenderness elicited at the left hip  joint. NEUROLOGICAL:  Cranial hours II through XII grossly are intact.  Motor strength is 5/5 in both upper and lower extremities.  Sensations are intact.  PERTINENT IMAGING STUDIES IN THE HOSPITAL TODAY: 1. Hip x-ray on August 20, 2010, shows comminuted intertrochanteric     fracture of the left femur. 2. Chest x-ray showed low lung volumes and no acute cardiopulmonary     disease.  PERTINENT LABORATORY DATA:  CBC shows WBC 7.7, hemoglobin is 11.9, hematocrit 36.4, and platelet count of 207.  PT is 1.24.  BMET shows sodium 139, potassium is 4.0, chloride is 105, CO2 is 28, BUN 17, creatinine 0.85, and glucose is 149.  ASSESSMENT: 1. Left hip fracture. 2. History of coronary artery disease with recent coronary stents     placement. 3. History of hyperthyroidism. 4. History of diastolic congestive heart failure.  PLAN: 1. Left hip fracture:  The patient is to be seen by Dr. Dion Holt for     possible surgery.  We will keep the patient on Dilaudid for pain     control. 2. History of CAD with recent coronary stents placement:  The patient     has had 3 drug-eluting stents placed and is currently on aspirin     and Plavix and I called and discussed with Dr. Mayford Knife from the     [facility] and she will follow up the patient today and     make recommendations.  Considering the recent MI with 3 stents, the     patient will be at high risk for restenosis if the aspirin and     Plavix are stopped.  So, we will try to see if Dr. Dion Holt can do     the surgery with both aspirin and Plavix. 3. History of hyperthyroidism:  The patient will be continued on     methimazole. 4. Hypertension:  The patient will continue on the lisinopril and     metoprolol. 5. History of for diastolic congestive heart failure:  I am going to     hold the furosemide and spironolactone at this time because of the     low blood pressure and I will start the patient on gentle IV fluids     at 60 mL per hour. 6.  DVT prophylaxis:  SCDs.     Mauro Kaufmann, MD     GL/MEDQ  D:  08/20/2010  T:  08/20/2010  Job:  [id number]  Electronically Signed by Mauro Kaufmann  on 08/27/2010 12:12:32 PM

## 2010-08-30 LAB — CBC
HCT: 33.7 % — ABNORMAL LOW (ref 36.0–46.0)
HCT: 35.3 % — ABNORMAL LOW (ref 36.0–46.0)
Hemoglobin: 11.4 g/dL — ABNORMAL LOW (ref 12.0–15.0)
Hemoglobin: 11.6 g/dL — ABNORMAL LOW (ref 12.0–15.0)
Hemoglobin: 11.8 g/dL — ABNORMAL LOW (ref 12.0–15.0)
MCH: 30.5 pg (ref 26.0–34.0)
MCHC: 33.4 g/dL (ref 30.0–36.0)
MCHC: 33.8 g/dL (ref 30.0–36.0)
MCV: 90.1 fL (ref 78.0–100.0)
RBC: 3.89 MIL/uL (ref 3.87–5.11)

## 2010-08-30 LAB — HEMOGLOBIN A1C: Mean Plasma Glucose: 126 mg/dL — ABNORMAL HIGH (ref <117)

## 2010-08-30 LAB — BRAIN NATRIURETIC PEPTIDE: Pro B Natriuretic peptide (BNP): 206 pg/mL — ABNORMAL HIGH (ref 0.0–100.0)

## 2010-08-30 LAB — POCT I-STAT, CHEM 8
Glucose, Bld: 122 mg/dL — ABNORMAL HIGH (ref 70–99)
HCT: 36 % (ref 36.0–46.0)
Hemoglobin: 12.2 g/dL (ref 12.0–15.0)
Potassium: 3.4 mEq/L — ABNORMAL LOW (ref 3.5–5.1)
TCO2: 26 mmol/L (ref 0–100)

## 2010-08-30 LAB — T3: T3, Total: 67.4 ng/dl — ABNORMAL LOW (ref 80.0–204.0)

## 2010-08-30 LAB — DIFFERENTIAL
Basophils Relative: 0 % (ref 0–1)
Eosinophils Absolute: 0 10*3/uL (ref 0.0–0.7)
Eosinophils Relative: 0 % (ref 0–5)
Monocytes Absolute: 0.2 10*3/uL (ref 0.1–1.0)
Monocytes Relative: 4 % (ref 3–12)

## 2010-08-30 LAB — BASIC METABOLIC PANEL
BUN: 19 mg/dL (ref 6–23)
CO2: 26 mEq/L (ref 19–32)
Calcium: 8.2 mg/dL — ABNORMAL LOW (ref 8.4–10.5)
Chloride: 110 mEq/L (ref 96–112)
Creatinine, Ser: 0.85 mg/dL (ref 0.4–1.2)
GFR calc Af Amer: >60 mL/min (ref >60)
GFR calc non Af Amer: >60 mL/min (ref >60)
Glucose, Bld: 129 mg/dL — ABNORMAL HIGH (ref 70–99)
Sodium: 140 mEq/L (ref 135–145)

## 2010-08-30 LAB — LIPID PANEL
Cholesterol: 143 mg/dL (ref 0–200)
HDL: 55 mg/dL (ref >39)
LDL Cholesterol: 78 mg/dL (ref 0–99)
Total CHOL/HDL Ratio: 2.6 RATIO
Triglycerides: 52 mg/dL (ref <150)

## 2010-08-30 LAB — CARDIAC PANEL(CRET KIN+CKTOT+MB+TROPI)
CK, MB: 89.9 ng/mL (ref 0.3–4.0)
Relative Index: 17.2 — ABNORMAL HIGH (ref 0.0–2.5)
Relative Index: 18.9 — ABNORMAL HIGH (ref 0.0–2.5)
Total CK: 72 U/L (ref 7–177)

## 2010-08-30 LAB — APTT: aPTT: 114 seconds — ABNORMAL HIGH (ref 24–37)

## 2010-08-30 LAB — COMPREHENSIVE METABOLIC PANEL
BUN: 28 mg/dL — ABNORMAL HIGH (ref 6–23)
CO2: 26 mEq/L (ref 19–32)
Calcium: 8.1 mg/dL — ABNORMAL LOW (ref 8.4–10.5)
Creatinine, Ser: 0.86 mg/dL (ref 0.4–1.2)
GFR calc Af Amer: >60 mL/min (ref >60)
Glucose, Bld: 122 mg/dL — ABNORMAL HIGH (ref 70–99)

## 2010-08-30 LAB — GLUCOSE, CAPILLARY
Glucose-Capillary: 105 mg/dL — ABNORMAL HIGH (ref 70–99)
Glucose-Capillary: 107 mg/dL — ABNORMAL HIGH (ref 70–99)

## 2010-08-30 LAB — MRSA PCR SCREENING: MRSA by PCR: NEGATIVE

## 2010-08-30 LAB — CK TOTAL AND CKMB (NOT AT ARMC)
CK, MB: 21.4 ng/mL (ref 0.3–4.0)
Total CK: 222 U/L — ABNORMAL HIGH (ref 7–177)

## 2010-09-05 LAB — COMPREHENSIVE METABOLIC PANEL
ALT: 19 U/L (ref 0–35)
ALT: 20 U/L (ref 0–35)
ALT: 21 U/L (ref 0–35)
AST: 15 U/L (ref 0–37)
AST: 19 U/L (ref 0–37)
AST: 19 U/L (ref 0–37)
AST: 23 U/L (ref 0–37)
AST: 39 U/L — ABNORMAL HIGH (ref 0–37)
Albumin: 3.3 g/dL — ABNORMAL LOW (ref 3.5–5.2)
Albumin: 3.4 g/dL — ABNORMAL LOW (ref 3.5–5.2)
Albumin: 3.7 g/dL (ref 3.5–5.2)
Alkaline Phosphatase: 51 U/L (ref 39–117)
BUN: 18 mg/dL (ref 6–23)
BUN: 21 mg/dL (ref 6–23)
BUN: 23 mg/dL (ref 6–23)
CO2: 25 mEq/L (ref 19–32)
CO2: 30 mEq/L (ref 19–32)
CO2: 31 mEq/L (ref 19–32)
Calcium: 7.9 mg/dL — ABNORMAL LOW (ref 8.4–10.5)
Calcium: 8.8 mg/dL (ref 8.4–10.5)
Calcium: 8.8 mg/dL (ref 8.4–10.5)
Calcium: 9.1 mg/dL (ref 8.4–10.5)
Chloride: 100 mEq/L (ref 96–112)
Chloride: 106 mEq/L (ref 96–112)
Creatinine, Ser: 0.68 mg/dL (ref 0.4–1.2)
Creatinine, Ser: 0.73 mg/dL (ref 0.4–1.2)
Creatinine, Ser: 0.76 mg/dL (ref 0.4–1.2)
GFR calc Af Amer: >60 mL/min (ref >60)
GFR calc Af Amer: >60 mL/min (ref >60)
GFR calc Af Amer: >60 mL/min (ref >60)
GFR calc Af Amer: >60 mL/min (ref >60)
GFR calc non Af Amer: >60 mL/min (ref >60)
GFR calc non Af Amer: >60 mL/min (ref >60)
GFR calc non Af Amer: >60 mL/min (ref >60)
Glucose, Bld: 108 mg/dL — ABNORMAL HIGH (ref 70–99)
Potassium: 3.6 mEq/L (ref 3.5–5.1)
Sodium: 138 mEq/L (ref 135–145)
Sodium: 138 mEq/L (ref 135–145)
Sodium: 140 mEq/L (ref 135–145)
Sodium: 140 mEq/L (ref 135–145)
Total Bilirubin: 0.8 mg/dL (ref 0.3–1.2)
Total Bilirubin: 0.9 mg/dL (ref 0.3–1.2)
Total Protein: 6.4 g/dL (ref 6.0–8.3)
Total Protein: 6.8 g/dL (ref 6.0–8.3)
Total Protein: 6.9 g/dL (ref 6.0–8.3)

## 2010-09-05 LAB — PROTIME-INR
INR: 1.14 (ref 0.00–1.49)
Prothrombin Time: 14.5 seconds (ref 11.6–15.2)

## 2010-09-05 LAB — DIFFERENTIAL
Basophils Absolute: 0 10*3/uL (ref 0.0–0.1)
Basophils Absolute: 0.2 10*3/uL — ABNORMAL HIGH (ref 0.0–0.1)
Basophils Relative: 2 % — ABNORMAL HIGH (ref 0–1)
Eosinophils Absolute: 0.2 10*3/uL (ref 0.0–0.7)
Eosinophils Absolute: 0.2 10*3/uL (ref 0.0–0.7)
Eosinophils Relative: 0 % (ref 0–5)
Eosinophils Relative: 2 % (ref 0–5)
Eosinophils Relative: 2 % (ref 0–5)
Lymphocytes Relative: 15 % (ref 12–46)
Lymphocytes Relative: 28 % (ref 12–46)
Lymphocytes Relative: 29 % (ref 12–46)
Lymphs Abs: 2.2 10*3/uL (ref 0.7–4.0)
Lymphs Abs: 2.7 10*3/uL (ref 0.7–4.0)
Lymphs Abs: 2.8 10*3/uL (ref 0.7–4.0)
Monocytes Absolute: 0.8 10*3/uL (ref 0.1–1.0)
Monocytes Absolute: 0.9 10*3/uL (ref 0.1–1.0)
Monocytes Absolute: 0.9 10*3/uL (ref 0.1–1.0)
Monocytes Relative: 6 % (ref 3–12)
Monocytes Relative: 7 % (ref 3–12)
Monocytes Relative: 9 % (ref 3–12)
Neutro Abs: 5.8 10*3/uL (ref 1.7–7.7)
Neutro Abs: 6.2 10*3/uL (ref 1.7–7.7)
Neutro Abs: 6.6 10*3/uL (ref 1.7–7.7)
Neutrophils Relative %: 56 % (ref 43–77)
Neutrophils Relative %: 60 % (ref 43–77)
Neutrophils Relative %: 61 % (ref 43–77)

## 2010-09-05 LAB — CARDIAC PANEL(CRET KIN+CKTOT+MB+TROPI)
CK, MB: 25.3 ng/mL (ref 0.3–4.0)
Relative Index: 10.6 — ABNORMAL HIGH (ref 0.0–2.5)
Relative Index: 12.9 — ABNORMAL HIGH (ref 0.0–2.5)
Total CK: 238 U/L — ABNORMAL HIGH (ref 7–177)

## 2010-09-05 LAB — CBC
HCT: 37.9 % (ref 36.0–46.0)
HCT: 38.2 % (ref 36.0–46.0)
HCT: 39.1 % (ref 36.0–46.0)
HCT: 40.3 % (ref 36.0–46.0)
HCT: 42 % (ref 36.0–46.0)
Hemoglobin: 13.1 g/dL (ref 12.0–15.0)
MCHC: 33.5 g/dL (ref 30.0–36.0)
MCHC: 33.7 g/dL (ref 30.0–36.0)
MCHC: 34.1 g/dL (ref 30.0–36.0)
MCHC: 34.1 g/dL (ref 30.0–36.0)
MCHC: 34.5 g/dL (ref 30.0–36.0)
MCV: 89.9 fL (ref 78.0–100.0)
MCV: 90.1 fL (ref 78.0–100.0)
MCV: 90.2 fL (ref 78.0–100.0)
MCV: 90.6 fL (ref 78.0–100.0)
MCV: 90.6 fL (ref 78.0–100.0)
MCV: 90.7 fL (ref 78.0–100.0)
Platelets: 245 10*3/uL (ref 150–400)
Platelets: 251 10*3/uL (ref 150–400)
Platelets: 254 10*3/uL (ref 150–400)
Platelets: 273 10*3/uL (ref 150–400)
RBC: 4.22 MIL/uL (ref 3.87–5.11)
RBC: 4.32 MIL/uL (ref 3.87–5.11)
RBC: 4.35 MIL/uL (ref 3.87–5.11)
RDW: 14.7 % (ref 11.5–15.5)
RDW: 14.8 % (ref 11.5–15.5)
RDW: 15.1 % (ref 11.5–15.5)
WBC: 10.6 10*3/uL — ABNORMAL HIGH (ref 4.0–10.5)
WBC: 11.6 10*3/uL — ABNORMAL HIGH (ref 4.0–10.5)
WBC: 12.3 10*3/uL — ABNORMAL HIGH (ref 4.0–10.5)
WBC: 14.6 10*3/uL — ABNORMAL HIGH (ref 4.0–10.5)

## 2010-09-05 LAB — APTT: aPTT: 31 seconds (ref 24–37)

## 2010-09-05 LAB — BASIC METABOLIC PANEL
Chloride: 106 mEq/L (ref 96–112)
GFR calc Af Amer: >60 mL/min (ref >60)
Potassium: 3.9 mEq/L (ref 3.5–5.1)
Sodium: 142 mEq/L (ref 135–145)

## 2010-09-05 LAB — TROPONIN I: Troponin I: 1.61 ng/mL (ref 0.00–0.06)

## 2010-09-05 LAB — POCT CARDIAC MARKERS: Myoglobin, poc: 63.3 ng/mL (ref 12–200)

## 2010-09-05 LAB — POCT I-STAT, CHEM 8
Creatinine, Ser: 0.8 mg/dL (ref 0.4–1.2)
HCT: 41 % (ref 36.0–46.0)
Hemoglobin: 13.9 g/dL (ref 12.0–15.0)
Potassium: 4.5 mEq/L (ref 3.5–5.1)
Sodium: 142 mEq/L (ref 135–145)
TCO2: 33 mmol/L (ref 0–100)

## 2010-09-05 LAB — MAGNESIUM
Magnesium: 2.5 mg/dL (ref 1.5–2.5)
Magnesium: 2.6 mg/dL — ABNORMAL HIGH (ref 1.5–2.5)

## 2010-09-05 LAB — BRAIN NATRIURETIC PEPTIDE: Pro B Natriuretic peptide (BNP): 100 pg/mL (ref 0.0–100.0)

## 2010-09-05 LAB — MRSA PCR SCREENING: MRSA by PCR: NEGATIVE

## 2010-09-05 LAB — T4: T4, Total: 7.2 ug/dL (ref 5.0–12.5)

## 2010-09-05 LAB — T3: T3, Total: 63.6 ng/dl — ABNORMAL LOW (ref 80.0–204.0)

## 2010-09-05 LAB — TSH: TSH: 0.335 u[IU]/mL — ABNORMAL LOW (ref 0.350–4.500)

## 2010-09-05 LAB — CK TOTAL AND CKMB (NOT AT ARMC): Total CK: 87 U/L (ref 7–177)

## 2010-09-06 LAB — CBC
HCT: 41.8 % (ref 36.0–46.0)
MCHC: 34.4 g/dL (ref 30.0–36.0)
Platelets: 232 10*3/uL (ref 150–400)
RDW: 14.5 % (ref 11.5–15.5)

## 2010-09-06 LAB — T4, FREE: Free T4: 1.12 ng/dL (ref 0.80–1.80)

## 2010-09-06 LAB — LIPID PANEL
HDL: 64 mg/dL (ref >39)
Total CHOL/HDL Ratio: 2.3 RATIO
Triglycerides: 59 mg/dL (ref <150)
VLDL: 12 mg/dL (ref 0–40)

## 2010-09-06 LAB — COMPREHENSIVE METABOLIC PANEL
Albumin: 4 g/dL (ref 3.5–5.2)
Alkaline Phosphatase: 61 U/L (ref 39–117)
BUN: 22 mg/dL (ref 6–23)
Calcium: 9.9 mg/dL (ref 8.4–10.5)
Potassium: 3.6 mEq/L (ref 3.5–5.1)
Total Protein: 7.2 g/dL (ref 6.0–8.3)

## 2010-09-06 LAB — DIFFERENTIAL
Basophils Relative: 1 % (ref 0–1)
Lymphocytes Relative: 18 % (ref 12–46)
Lymphs Abs: 1.4 10*3/uL (ref 0.7–4.0)
Monocytes Absolute: 0.3 10*3/uL (ref 0.1–1.0)
Monocytes Relative: 4 % (ref 3–12)
Neutro Abs: 5.8 10*3/uL (ref 1.7–7.7)
Neutrophils Relative %: 76 % (ref 43–77)

## 2010-09-06 LAB — URINALYSIS, ROUTINE W REFLEX MICROSCOPIC
Glucose, UA: NEGATIVE mg/dL
Ketones, ur: 15 mg/dL — AB
pH: 5 (ref 5.0–8.0)

## 2010-09-06 LAB — URINE MICROSCOPIC-ADD ON

## 2010-09-06 LAB — POCT CARDIAC MARKERS
CKMB, poc: 3.6 ng/mL (ref 1.0–8.0)
Myoglobin, poc: 167 ng/mL (ref 12–200)
Troponin i, poc: <0.05 ng/mL (ref 0.00–0.09)

## 2010-09-06 LAB — T3, FREE: T3, Free: 2.6 pg/mL (ref 2.3–4.2)

## 2010-10-26 ENCOUNTER — Other Ambulatory Visit: Payer: Self-pay | Admitting: Endocrinology

## 2010-11-01 NOTE — H&P (Signed)
[name], [name]              ACCOUNT NO.:  [id number]   MEDICAL RECORD NO.:  [id number]          PATIENT TYPE:  EMS   LOCATION:  MAJO                         FACILITY:  [facility]   PHYSICIAN:  Eduard Clos, MDDATE OF BIRTH:  1932-05-18   DATE OF ADMISSION:  11/28/2007  DATE OF DISCHARGE:                              HISTORY & PHYSICAL   CHIEF COMPLAINT:  Bilateral lower extremity weakness.   HISTORY OF PRESENT ILLNESS:  A 77 year old female with known history of  CAD, status post stenting, hypertension, hyperlipidemia, presented to  the ER complaining of increasing lower extremity weakness.  The patient  stated the weakness started around a week ago and it started to progress  over the last few days, now that she even finds it difficult to walk.  The patient denies any weakness in the upper extremities and has been  using a walker now to walk, but even that the patient feels is getting  difficult.  Denies any incontinence of urine or feces.  Denies any  dizziness or loss of consciousness.  The patient is actually scheduled  for an angiogram for her heart next week.  The patient denies any  palpitations, nausea, vomiting, diarrhea, fever or chills now or prior  to this incident.   PAST MEDICAL HISTORY:  1. CAD, status post stenting.  2. Hypertension.  3. Hyperlipidemia.  4. Osteoporosis.   PAST SURGICAL HISTORY:  1. Bilateral knee surgeries.  2. Hysterectomy.  3. Stenting.   MEDICATIONS PRIOR TO ADMISSION:  1. Alendronate 70 mg p.o. every week.  2. Amlodipine 5 mg p.o. daily.  3. Furosemide 40 mg p.o. daily.  4. Imdur 60 mg p.o. daily.  5. Lipitor 40 mg p.o. daily.  6. Lopressor 25 mg p.o. b.i.d.  7. Plavix 75 mg p.o. daily.  8. Potassium chloride 20 mEq p.o. daily.  9. Quinapril 20 mg p.o. daily.   ALLERGIES:  IODINE.   FAMILY HISTORY:  Nothing contributory.   SOCIAL HISTORY:  The patient denies smoking cigarettes.  Drinks alcohol  occasionally.   REVIEW  OF SYSTEMS:  As presented in the history of presenting illness,  nothing else significant.   PHYSICAL EXAMINATION:  The patient examined at bedside, not in acute  distress.  VITAL SIGNS:  Blood pressure 176/87, pulse 54 per minute, temperature  97.2, respirations 18 per minute.  O2 saturation 97%.  HEENT:  Anicteric.  No pallor.  PERRLA positive.  There is no facial  asymmetry.  CHEST:  Bilateral air entry present.  No rhonchi.  No crepitation.  HEART:  S1, S2 heard.  ABDOMEN:  Soft, nontender, bowel sounds heard.  CNS:  Alert, awake, oriented to time, place and person.  Moves both  upper extremities 5/5.  Lower extremities she is only able to move 3/5  on both sides.  EXTREMITIES:  Peripheral pulses felt.  No edema.   LABS:  CBC:  WBC is 9, hemoglobin 14.9, hematocrit 44.1, platelets 251.  Complete metabolic panel:  Sodium 141, potassium 3.6, chloride 102,  bicarb 28, glucose 94, BUN 20, creatinine 0.72, total bilirubin 0.7,  alkaline phosphatase 50,  AST 32, ALT 26, total protein 7.2, albumin 4.2,  calcium 9.7.  UA:  Urine glucose negative, ketones negative, wbc's 3-6,  rbc's 0-2.   ASSESSMENT:  1. Bilateral progressive lower extremity weakness.  2. Coronary artery disease, status post stenting.  3. Hypertension.  4. Hyperlipidemia.  5. Osteoporosis.   PLAN:  Admit the patient to telemetry in the neuro unit.  We will place  the patient on neuro checks.  We will also check vital capacity and  negative inspiratory forces every 8 hours.  We will resume her home  medication.  We will get a CT scan of the head.  I have discussed with  the neurologist.  We will follow up neurology recommendations.      Eduard Clos, MD  Electronically Signed     ANK/MEDQ  D:  11/29/2007  T:  11/29/2007  Job:  [id number]

## 2010-11-01 NOTE — Consult Note (Signed)
NAMEROBBYE, [name]              ACCOUNT NO.:  [id number]   MEDICAL RECORD NO.:  [id number]          PATIENT TYPE:  INP   LOCATION:  4731                         FACILITY:  [facility]   PHYSICIAN:  Pramod P. Pearlean Brownie, MD    DATE OF BIRTH:  [birth year]   DATE OF CONSULTATION:  DATE OF DISCHARGE:                                 CONSULTATION   REFERRING PHYSICIAN:  [facility].   REASON FOR REFERRAL:  Gait difficulty and leg weakness.   HISTORY OF PRESENT ILLNESS:  [name] is a 77 year old pleasant  [name] lady who has had difficulty walking and leg weakness now for  about a week.  She states her symptoms began last Saturday when she woke  up with nausea and some right-sided abdominal pain.  She also had  vomited once.  Next day, she noticed she could not walk right, she was  dizzy, off balance, and the son noticed that she was leaning to one  side.  She was able to walk with a walker, but the next day, her  symptoms got worse.  Symptoms have persisted and she came yesterday for  evaluation.  She on enquiry also admits to some transient diplopia, but  she is vague about the details.  The patient does not speak much English  and history is obtained from her son who is present at the bedside who  translates for her.  The patient denied any loss of vision, headache,  slurred speech, or difficulty swallowing.  She denies significant back  pain, injury, fall, radicular pain, lack of bladder control, or numbness  in the legs.  No prior history of stroke, TIAs, seizures, or significant  neurological problems.   PAST MEDICAL HISTORY:  1. Hypertension.  2. Hyperlipidemia.  3. Coronary artery disease.  4. Osteoporosis.   PAST SURGICAL HISTORY:  Bilateral knee surgery and hysterectomy.   HOME MEDICATIONS:  Alendronate, amlodipine, furosemide, Imdur, Lipitor,  Lopressor, Plavix, potassium, and quinapril.   MEDICATION ALLERGIES:  IODINE.   FAMILY HISTORY:  Noncontributory.   SOCIAL  HISTORY:  The patient is married, lives with her husband.  She  does not smoke.  She drinks alcohol occasionally.   REVIEW OF SYSTEMS:  Not significant for recent fever, cough, chest pain,  diarrhea, or other illness.   PHYSICAL EXAMINATION:  GENERAL:  Reveals elderly pleasant [name] lady  who is at present not in distress.  VITAL SIGNS:  Temperature is 97.3, pulse rate is 47 per minute and  regular sinus, blood pressure 167/83, respiratory rate 18 per minute,  and sats 94% on room air.  HEAD:  Nontraumatic.  NECK:  Supple.  There is no bruit.  ENT:  Unremarkable.  CARDIAC:  No murmur or gallop.  LUNGS:  Clear to auscultation.  NEUROLOGICAL:  She is awake and alert.  She appears oriented to time,  place, and person.  She is fluent in the speech.  She follows commands  well.  Movements are full range with no nystagmus.  There is no Horner's  sign seen.  Tongue is midline.  Motor system exam reveals no upper  extremity drift.  She has symmetric strength, tone, reflexes,  coordination, and sensation in the upper extremities.  She has a mild  right lower extremity drift.  She has significant proximal weakness in  the right leg, 3/5 at the hip flexors and 4/5 in the hip extensors and  the abductors.  She has 4+/5 strength in the right knee and ankle.  She  does not have any significant weakness in the left lower extremity when  tested in isolation, but on double simultaneous testing on both lower  extremities there appears to be mild weakness in the left leg.  There is  no sensory loss in the trunk.  No sensory loss in lower extremities.  Position and vibration sense are preserved.  Knee jerks easily elicited,  but right ankle jerk is absent and left is depressed.  She has extremity  ataxic gait.  She requires 2+ assist to stand up.  She stands with broad-  based gait and has some gait initiation apraxia and ataxia.   DATA REVIEWED:  Noncontrast CAT scan of the head done today reveals   extensive changes of small vessel disease.  No definite acute infarct is  noted.   LABORATORY DATA:  Labs reveal normal comprehensive metabolic panel and  CBC.  UA is negative.   IMPRESSION:  A 77 year old lady with sudden onset of gait ataxia and leg  weakness of a week duration, probably small brainstem infarct, given the  history of onset with nausea and some double vision.   PLAN:  I will recommend checking MRI scan of the brain and MRA of the  brain, carotid ultrasound, fasting lipid profile, hemoglobin A1c, and  homocysteine.  If no definite infarcts found, may need to evaluate the  spine to rule out compressive myelopathy.  I had a long discussion with  the patient, her son, and daughter-in-law regarding the above plan and  answered questions.  Continue Plavix from now.  If stroke is confirmed,  we may need to consider changing to Aggrenox.   I will be happy to follow the patient.  Consults, kindly call for  questions.           ______________________________  Sunny Schlein. Pearlean Brownie, MD     PPS/MEDQ  D:  11/29/2007  T:  11/30/2007  Job:  [id number]   cc:   Colleen Can. Deborah Chalk, M.D.

## 2010-11-01 NOTE — H&P (Signed)
NAMELILLER, YOHN              ACCOUNT NO.:  [id number]   MEDICAL RECORD NO.:  [id number]          PATIENT TYPE:   LOCATION:                                 FACILITY:   PHYSICIAN:  Colleen Can. Deborah Chalk, M.D.DATE OF BIRTH:  24-Nov-1931   DATE OF ADMISSION:  12/02/2007  DATE OF DISCHARGE:                              HISTORY & PHYSICAL   CHIEF COMPLAINT:  Fatigue.   HISTORY OF PRESENT ILLNESS:  Ms. Mcenroe is a 77 year old married  female from [place].  She originally moved to [place], [place],  however, she has recently moved to [place] to be near her son and  daughter-in-law.  She has had a known history of ischemic heart disease  that dates back to 1999 and has had several cardiac catheterizations.  She has been maintained on chronic Plavix.  She was seen in our office  to establish cardiology care towards the latter part of May, and was  referred for Cardiolite study that was performed on November 19, 2007.  Overall, the study was felt to be nondiagnostic.  She had marked breast  attenuation and an anterior wall abnormality could not be excluded.  The  ejection fraction was felt to be better than the 44%.  She was also  noted to have marked EKG changes as well as marked bradycardia.  She is  now referred for repeat cardiac catheterization.  Clinically, she has  not had complaints of chest pain, but she does have significant fatigue.   PAST MEDICAL HISTORY:  1. Known ischemic heart disease with previous stenting in 1999.  Her      records are still awaiting arrival from [place] and have yet to be      reviewed.  She has been maintained on Plavix since 1999.  2. Hypertension for over 20 years.  3. Hyperlipidemia, on statin therapy.  4. Obesity.  5. Anxiety and depression.  6. Chronic low back pain.  7. Osteoporosis.  8. Hip pain.  9. Urinary frequency.  10.Remote hysterectomy.  11.Two previous knee surgeries.   ALLERGIES:  X-RAY DYE, IODINE, HAZELNUTS, and  PEANUTS.   CURRENT MEDICATIONS:  1. Plavix 75 mg a day.  2. Potassium 20 mEq a day.  3. Metoprolol 25 b.i.d.  4. Imdur 30 mg a day.  5. Lipitor 40 a day.  6. Lasix 40 a day.  7. Fosamax weekly.  8. Amlodipine 5 mg a day.  9. Baby aspirin daily.  10.Multivitamin daily.   FAMILY HISTORY:  Her father died at 3 with heart disease and stroke.  Mother died at 6 with heart disease and pneumonia.   SOCIAL HISTORY:  She has recently moved to [place] to be near family.  She has had no recent chest pain or shortness of breath.  She does have  significant back and hip pain.  She does remain somewhat depressed and  cries very easily.  She has had no recent fever, flu, or cough.   PHYSICAL EXAMINATION:  GENERAL:  She is anxious.  She is somewhat  tearful.  VITAL SIGNS:  Her weight is 165.5 pounds, blood  pressure is 120/70,  heart rate is 60, respirations 18, and she is afebrile.  SKIN:  Warm and dry.  Color is unremarkable.  LUNGS:  Basically clear.  HEART:  Tones are distant.  There was no murmur or gallop.  ABDOMEN:  Obese, soft, positive bowel sounds, and nontender.  EXTREMITIES:  Without edema.  NEUROLOGIC:  No gross focal deficits.   PERTINENT LABS:  Pending.   OVERALL IMPRESSION:  1. Known ischemic heart disease.  2. Hypertension.  3. Hyperlipidemia.  4. Obesity.  5. Abnormal Cardiolite study.   PLAN:  We will proceed now with cardiac catheterization.  Procedure has  been reviewed in full detail.  She will be premedicated for the x-ray  dye allergy as well.      Sharlee Blew, N.P.      Colleen Can. Deborah Chalk, M.D.  Electronically Signed    LC/MEDQ  D:  11/26/2007  T:  11/27/2007  Job:  [id number]

## 2010-11-01 NOTE — Discharge Summary (Signed)
[name], [name]              ACCOUNT NO.:  [id number]   MEDICAL RECORD NO.:  [id number]          PATIENT TYPE:  INP   LOCATION:  4731                         FACILITY:  [facility]   PHYSICIAN:  [name], M.D.DATE OF BIRTH:  [birth year]   DATE OF ADMISSION:  11/28/2007  DATE OF DISCHARGE:                               DISCHARGE SUMMARY   DATE OF DISCHARGE:  Pending.   DISCHARGE SUMMARY/FINAL DIAGNOSES:  1. Progressive right lower extremity weakness felt to be secondary to      small left brain stem infarct not seen on MRI with gait ataxia.  2. Sinus bradycardia.   SECONDARY DIAGNOSES:  1. Hypertension.  2. Hyperlipidemia.  3. Coronary artery disease.   CONSULTANTS:  Neurology consult provided by Dr. Pearlean Holt.   PROCEDURES:  Head CT scan done on November 29, 2007, showed moderate to  severe atrophy and chronic small-vessel disease.  No acute intracranial  abnormality.  Chest x-ray showed no acute findings.  X-ray lumbar spine  shows spondylosis/facet disease.  No acute findings.  MRI of the brain  done on November 29, 2007, showed no acute intracranial abnormality, very  advanced small vessel ischemic disease including widespread prior micro  hemorrhages, involvement of the cerebral white matter, calami, pons and  cerebellum.  MRA of the neck showed no hemodynamically significant  stenosis of the neck.  MRA of the head showed no focal intracranial  occlusion, mild to moderate stenosis of the left middle cerebral artery  origin, mild atherosclerosis of the right cavernous internal carotid  artery. MRI of the lumbar spine showed central disk protrusion and  spurring at T11-T12 without significant spinal stenosis.  Multilevel  degenerative changes throughout  the lumbar spine, mild foraminal  narrowing bilaterally at L1-L2.  Accentuated lumbar lordosis,  degenerative changes between spinous processes which is at touching  especially L4-L5.  MRI of the cervical spine-  There is no  cord  compression or intrinsic cord abnormality, mild cervical degenerative  changes at present without spinal stenosis.  There is prominent epidural  enhancement tissue from C1-C4 without mass effect.   BRIEF HISTORY:  Please refer to the admission H&P for full details.  The  patient, [name], is a 77 year old [place] speaking lady who  developed difficulty with walking a week prior to presentation and this  progressed up to the point that she could not walk.  Upon initial  evaluation, she had significant weakness in her right leg and mild  weakness in her left leg.  There was no sensory loss,  position and  vibration sense were intact. The patient was admitted for further  evaluation.   HOSPITAL COURSE:  1. Lower extremity weakness.  She underwent an MRI of the head, which      was negative for acute stroke.  MRA showed no significant major      vessel occlusion.  She had cervical and lumbar spine MRI results as      noted above.  These were unremarkable for any compressive lesions.      Neurology opined that the patient could have had a small brainstem  stroke which did not show on MRI.  Her symptoms and the lower      extremity weakness was mainly on the right with 3/5 muscle power at      the hip flexors, 4/5 the hip extensors and abductors. She had      fairly good strength in her right knee and ankle.  Left lower      extremity showed minimal weakness.  The patient was seen by      physical and occupational therapist.  She is able to ambulate with      assistance using a roller walker.  She will need short-term skilled      nursing facility for rehabilitation. The patient was continued on      Plavix as before.  2. Sinus bradycardia.  The patient had sinus bradycardia while on      telemetry with heart rate between 50-60.  However, she had episodes      of sleeping bradycardia in the high 30s.  She was on metoprolol 25      mg b.i.d.  This has been reduced to Toprol XL  25 mg daily.  Heart      rate has remained improved to 60-78 beats per minute.  Blood      pressure was uncontrolled.  She was on lisinopril as well and      lisinopril was increased from 20 mg to 30 mg. Blood pressure at the      time of dictation was well-controlled.  Norvasc was increased from      5 mg to 10 mg daily for optimal blood pressure control.  3. Hyperlipidemia.  She was continued on Crestor during the course of      hospitalization.  4. Coronary artery disease.  This was stable.  The patient was planned      for a cardiac catheterization on Monday, December 02, 2007.  However,      because of the underlying lower extremity weakness, this has been      put on hold.  She denied that there was check chest pain or      shortness of breath during the course of hospitalization.  She was      also followed peripherally by Dr. Deborah Holt.   DISCHARGE MEDICATIONS:  1. Norvasc 10 mg p.o. daily.  2. Plavix 75 mg p.o. daily.  3. Lasix 40 mg p.o. daily.  4. Imdur 60 mg p.o. daily.  5. Lisinopril 30 mg p.o. daily.  6. Toprol XL 25 mg p.o. daily.  7. Crestor 20 mg p.o. daily.  8. Potassium chloride 20 mEq p.o. daily.  9. Alendronate 20 mg p.o. weekly.   DISPOSITION:  The patient will be discharged to a skilled nursing  facility to continue rehabilitation. She will need follow-up with Dr.  Deborah Holt  in 2 weeks.   DISCHARGE LABORATORY DATA:  Homocysteine level 9.6, hemoglobin A1c 6.0.  TSH 0.183, total cholesterol 155, triglyceride 178, HDL 55, LDL 64.      Shannon B. Corky Downs, M.D.  Electronically Signed     MBB/MEDQ  D:  12/02/2007  T:  12/02/2007  Job:  [id number]   cc:   Shannon P. Shannon Brownie, MD  [name], M.D.  [name], [place].P.

## 2010-11-01 NOTE — Discharge Summary (Signed)
NAMEMEKISHA, [name]              ACCOUNT NO.:  [id number]   MEDICAL RECORD NO.:  [id number]          PATIENT TYPE:  INP   LOCATION:  4731                         FACILITY:  [facility]   PHYSICIAN:  Eduard Clos, MDDATE OF BIRTH:  Mar 05, 1932   DATE OF ADMISSION:  11/28/2007  DATE OF DISCHARGE:  12/04/2007                               DISCHARGE SUMMARY   ADDENDUM   Please refer to the discharge summary dictated by Dr. Jamison Oka on  December 02, 2007 for the course in the hospital, procedures done prior to  the discharge summary.  Subsequently to the previous discharge summary,  the patient was waiting for skilled nursing facility.  The patient did  have some complaints of urge incontinence.  UA was compatible with  possible UTI.  At this time, the patient is being started on  ciprofloxacin for her UTI.  The patient did say that she had been having  urge incontinence for the last many years.  She was started on some  medication for this and she had worsening problems with that, and so she  was told to discontinue at that time.  At this time, the patient is  being arranged for transfer to a skilled nursing facility.  Plans have  been discussed with the patient's son.  The patient and the patient's  family are in agreement with plan.  I have also advised the patient's  son to get an outpatient urologic opinion with  urologist, for which he  has taken the number, and he has agreed to follow up with them in 2 to 3  weeks.   FINAL DIAGNOSES:  1. Progressive right lower extremity weakness, felt to be secondary to      small left brainstem infarct, not seen on MRI, with gait ataxia.  2. Sinus bradycardia.  3. Hypertension.  4. Hyperlipidemia.  5. Coronary artery disease.   MEDICATIONS AT DISCHARGE:  1. Norvasc 10 mg p.o. daily.  2. Plavix 75 mg p.o. daily.  3. Lasix 40 mg p.o. daily.  4. Imdur 6 mg daily.  5. Lisinopril 30 mg p.o. daily.  6. Toprol XL 25 mg p.o. daily.  7.  Crestor 20 mg p.o. daily.  8. Potassium chloride 20 mg p.o. daily.  9. Alendronate 20 mg p.o. weekly.  10.Ciprofloxacin 400 mg p.o. twice daily for 1 week.   PLAN:  The patient is to be transferred to skilled nursing facility.  On  arrival at the skilled nursing facility will recheck her basic metabolic  panel and CBC.  The patient is to follow with Dr. Deborah Chalk, cardiologist,  as scheduled in 2 weeks.  To follow up with urology as advised.  The  patient is to be on a cardiac-healthy diet, fall precaution.      Eduard Clos, MD  Electronically Signed     ANK/MEDQ  D:  12/04/2007  T:  12/04/2007  Job:  [id number]

## 2010-11-03 ENCOUNTER — Ambulatory Visit: Payer: Medicare Other | Attending: Orthopedic Surgery

## 2010-11-03 DIAGNOSIS — M25559 Pain in unspecified hip: Secondary | ICD-10-CM | POA: Insufficient documentation

## 2010-11-03 DIAGNOSIS — IMO0001 Reserved for inherently not codable concepts without codable children: Secondary | ICD-10-CM | POA: Insufficient documentation

## 2010-11-03 DIAGNOSIS — M6281 Muscle weakness (generalized): Secondary | ICD-10-CM | POA: Insufficient documentation

## 2010-11-03 DIAGNOSIS — R262 Difficulty in walking, not elsewhere classified: Secondary | ICD-10-CM | POA: Insufficient documentation

## 2010-11-07 ENCOUNTER — Ambulatory Visit: Payer: Medicare Other

## 2010-11-08 IMAGING — CR DG CHEST 2V
2 series · 2 of 2 positions shown · non-contrast
Comparison: 11/04/2009.

CLINICAL DATA: Pacemaker insertion.

CHEST - 2 VIEW

[w chest pa]
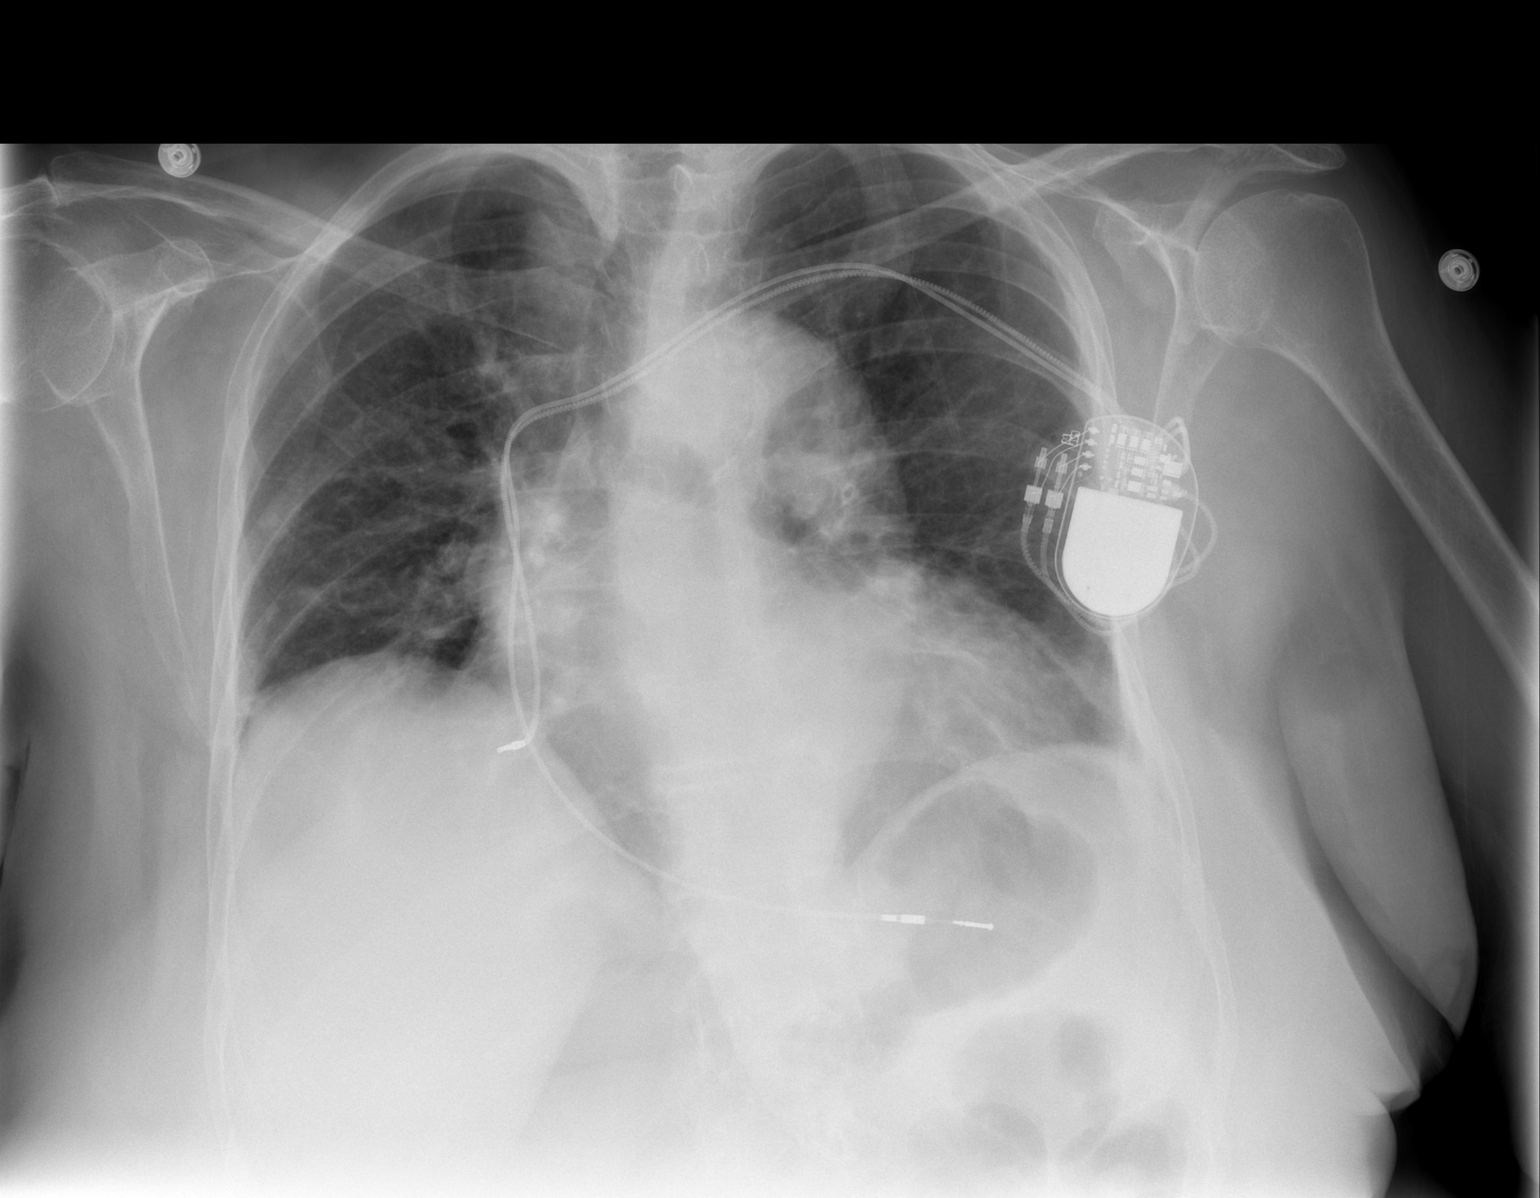

[w chest lat]
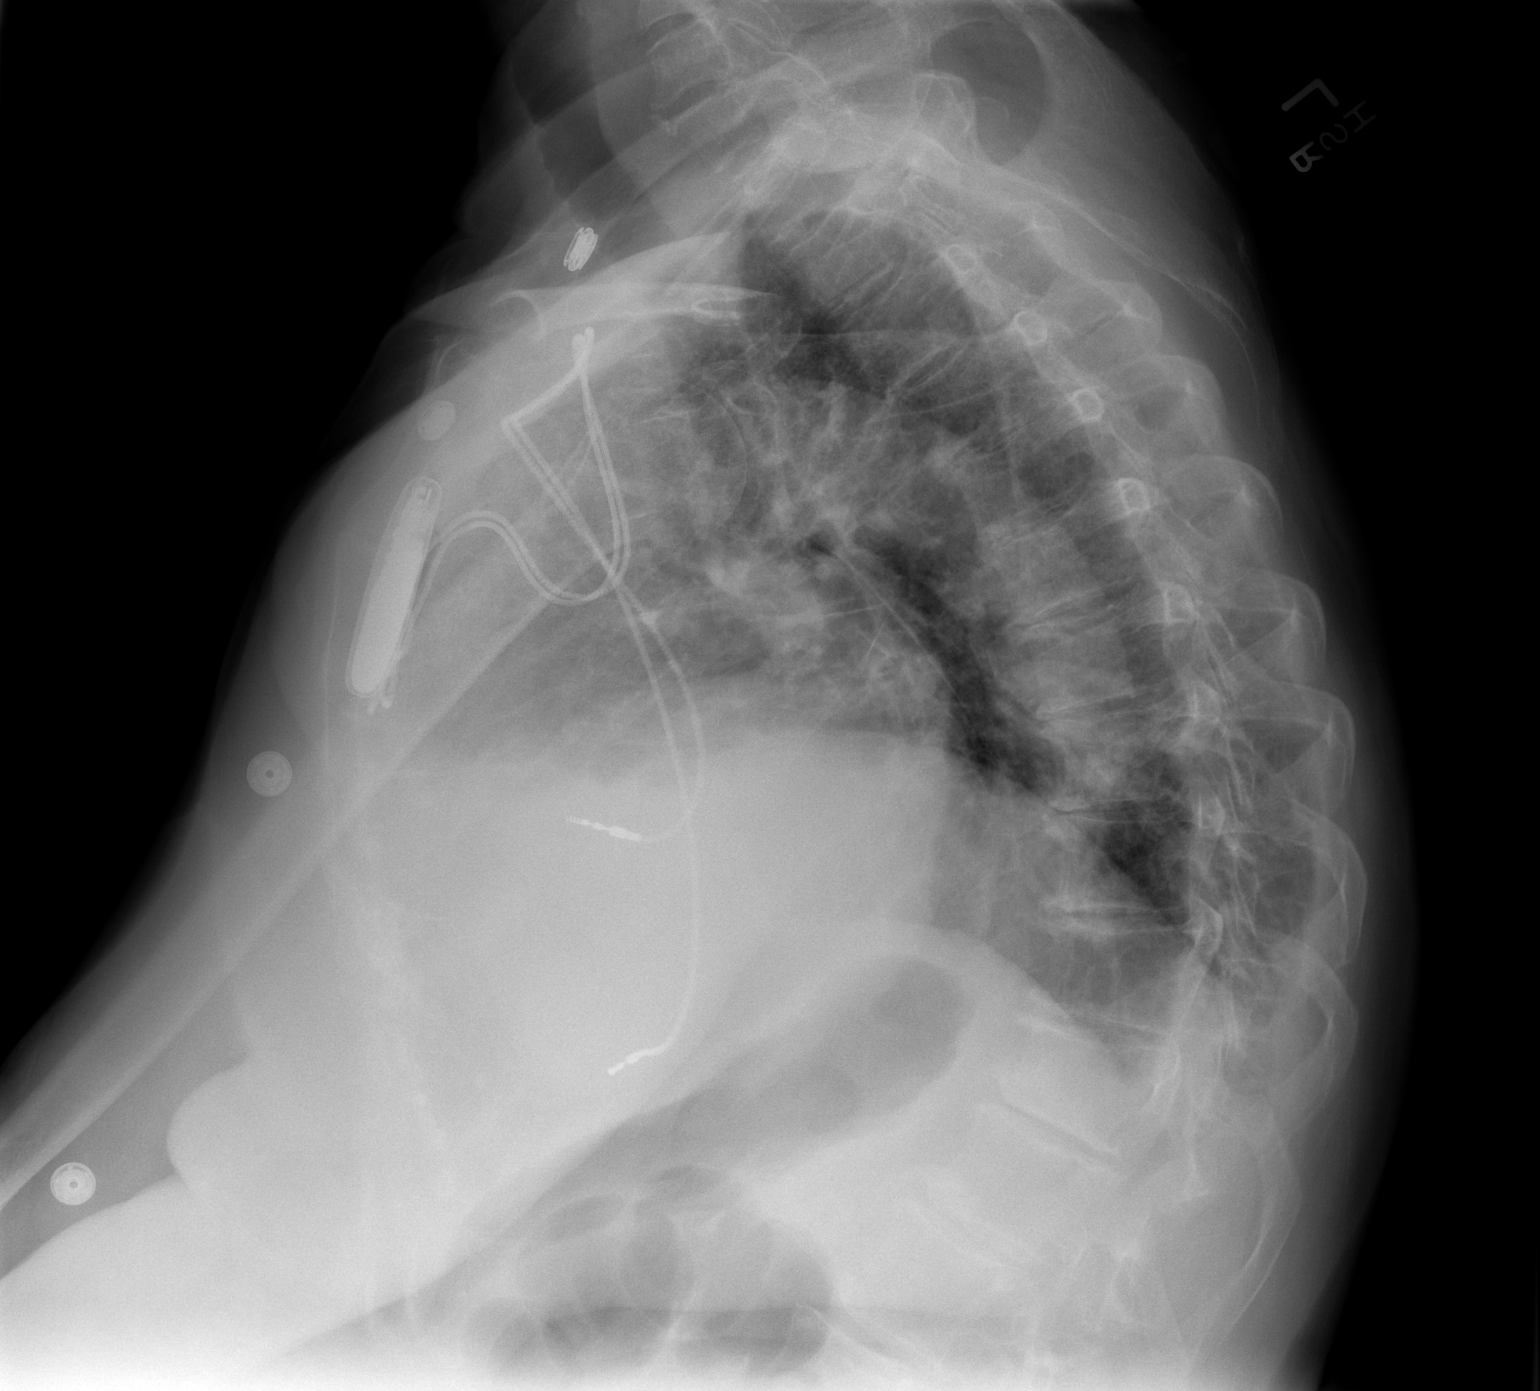

[2 of 2 positions shown; findings below may reference images not displayed]

FINDINGS: Dual lead pacemaker is in place with leads in the right
atrium and right ventricle in good position.  Negative for
pneumothorax.

Mild bibasilar atelectasis has developed since the prior study.
There is no heart failure.
IMPRESSION: Satisfactory pacemaker insertion.

Bibasilar atelectasis.

## 2010-11-09 ENCOUNTER — Ambulatory Visit: Payer: Medicare Other | Admitting: Physical Therapy

## 2010-11-11 ENCOUNTER — Ambulatory Visit: Payer: Medicare Other | Admitting: Physical Therapy

## 2010-11-16 ENCOUNTER — Ambulatory Visit: Payer: Medicare Other | Admitting: Physical Therapy

## 2010-11-17 ENCOUNTER — Ambulatory Visit: Payer: Medicare Other

## 2010-11-21 ENCOUNTER — Ambulatory Visit: Payer: Medicare Other | Attending: Orthopedic Surgery | Admitting: Physical Therapy

## 2010-11-21 DIAGNOSIS — R262 Difficulty in walking, not elsewhere classified: Secondary | ICD-10-CM | POA: Insufficient documentation

## 2010-11-21 DIAGNOSIS — M6281 Muscle weakness (generalized): Secondary | ICD-10-CM | POA: Insufficient documentation

## 2010-11-21 DIAGNOSIS — IMO0001 Reserved for inherently not codable concepts without codable children: Secondary | ICD-10-CM | POA: Insufficient documentation

## 2010-11-21 DIAGNOSIS — M25559 Pain in unspecified hip: Secondary | ICD-10-CM | POA: Insufficient documentation

## 2010-11-23 ENCOUNTER — Ambulatory Visit: Payer: Medicare Other

## 2010-11-25 ENCOUNTER — Ambulatory Visit: Payer: Medicare Other | Admitting: Physical Therapy

## 2010-11-28 ENCOUNTER — Ambulatory Visit: Payer: Medicare Other

## 2010-11-30 ENCOUNTER — Ambulatory Visit: Payer: Medicare Other

## 2010-12-01 ENCOUNTER — Other Ambulatory Visit: Payer: Self-pay | Admitting: *Deleted

## 2010-12-01 MED ORDER — SPIRONOLACTONE 25 MG PO TABS: 25.0000 mg | ORAL_TABLET | Freq: Every day | ORAL | Status: DC

## 2010-12-01 NOTE — Telephone Encounter (Signed)
Fax received from pharmacy. Refill completed. Jodette Aynslee Mulhall RN  

## 2010-12-02 ENCOUNTER — Ambulatory Visit: Payer: Medicare Other | Admitting: Physical Therapy

## 2010-12-05 ENCOUNTER — Ambulatory Visit: Payer: Medicare Other | Admitting: Physical Therapy

## 2010-12-05 ENCOUNTER — Other Ambulatory Visit: Payer: Self-pay | Admitting: *Deleted

## 2010-12-05 MED ORDER — SPIRONOLACTONE 25 MG PO TABS: 25.0000 mg | ORAL_TABLET | Freq: Every day | ORAL | Status: DC

## 2010-12-06 ENCOUNTER — Other Ambulatory Visit: Payer: Self-pay | Admitting: Cardiology

## 2010-12-06 MED ORDER — LISINOPRIL 20 MG PO TABS: 20.0000 mg | ORAL_TABLET | Freq: Every day | ORAL | Status: DC

## 2010-12-06 MED ORDER — FUROSEMIDE 40 MG PO TABS: 40.0000 mg | ORAL_TABLET | Freq: Every day | ORAL | Status: DC

## 2010-12-06 MED ORDER — CLOPIDOGREL BISULFATE 75 MG PO TABS: 75.0000 mg | ORAL_TABLET | Freq: Every day | ORAL | Status: DC

## 2010-12-06 NOTE — Telephone Encounter (Signed)
LISINOPRIL 20mg , FIROSIMIDE 40mg  and PLAVIX 75mg  need to be refilled-called to [facility] Patient's son also wants to talk to RN regarding her reassignment to Dr.Crenshaw.  He said that his mother has seen Dr.Nahser before.

## 2010-12-07 ENCOUNTER — Ambulatory Visit: Payer: Medicare Other | Admitting: Physical Therapy

## 2010-12-07 NOTE — Telephone Encounter (Signed)
Left message for son that refills were sent in to [facility] and to call back with any concerns.

## 2010-12-08 ENCOUNTER — Ambulatory Visit: Payer: Medicare Other | Admitting: Physical Therapy

## 2010-12-08 ENCOUNTER — Telehealth: Payer: Self-pay | Admitting: Cardiology

## 2010-12-08 NOTE — Telephone Encounter (Signed)
Called because his mother's Lisinopril's dosage was off. Instead of being refilled for twice a day as Dr. Deborah Chalk had prescribed, it was only refilled for once a day. I have pulled her chart.

## 2010-12-09 ENCOUNTER — Other Ambulatory Visit: Payer: Self-pay | Admitting: *Deleted

## 2010-12-09 NOTE — Telephone Encounter (Signed)
Left message for son Ed that Lisinopril 20 mg po bid #60 with 6 refills was [facility] [address] RN left message for Ed to call back with any concerns.

## 2010-12-12 ENCOUNTER — Ambulatory Visit: Payer: Medicare Other | Admitting: Physical Therapy

## 2010-12-14 ENCOUNTER — Ambulatory Visit: Payer: Medicare Other | Admitting: Physical Therapy

## 2010-12-16 ENCOUNTER — Ambulatory Visit: Payer: Medicare Other

## 2010-12-19 ENCOUNTER — Ambulatory Visit: Payer: Medicare Other | Attending: Orthopedic Surgery | Admitting: Physical Therapy

## 2010-12-19 DIAGNOSIS — M6281 Muscle weakness (generalized): Secondary | ICD-10-CM | POA: Insufficient documentation

## 2010-12-19 DIAGNOSIS — M25559 Pain in unspecified hip: Secondary | ICD-10-CM | POA: Insufficient documentation

## 2010-12-19 DIAGNOSIS — R262 Difficulty in walking, not elsewhere classified: Secondary | ICD-10-CM | POA: Insufficient documentation

## 2010-12-19 DIAGNOSIS — IMO0001 Reserved for inherently not codable concepts without codable children: Secondary | ICD-10-CM | POA: Insufficient documentation

## 2010-12-22 ENCOUNTER — Ambulatory Visit: Payer: Medicare Other

## 2010-12-23 ENCOUNTER — Encounter: Payer: Medicare Other | Admitting: Physical Therapy

## 2010-12-26 ENCOUNTER — Ambulatory Visit: Payer: Medicare Other | Admitting: Physical Therapy

## 2010-12-29 ENCOUNTER — Ambulatory Visit: Payer: Medicare Other

## 2011-01-02 ENCOUNTER — Ambulatory Visit: Payer: Medicare Other | Admitting: Physical Therapy

## 2011-01-05 ENCOUNTER — Ambulatory Visit: Payer: Medicare Other

## 2011-01-09 ENCOUNTER — Ambulatory Visit: Payer: Medicare Other | Admitting: Physical Therapy

## 2011-01-12 ENCOUNTER — Ambulatory Visit: Payer: Medicare Other

## 2011-01-16 ENCOUNTER — Encounter: Payer: Medicare Other | Admitting: Physical Therapy

## 2011-01-17 ENCOUNTER — Other Ambulatory Visit: Payer: Self-pay | Admitting: Endocrinology

## 2011-02-17 ENCOUNTER — Encounter: Payer: Self-pay | Admitting: *Deleted

## 2011-03-16 ENCOUNTER — Encounter: Payer: Medicare Other | Admitting: *Deleted

## 2011-03-16 LAB — URINALYSIS, ROUTINE W REFLEX MICROSCOPIC
Bilirubin Urine: NEGATIVE
Glucose, UA: NEGATIVE
Glucose, UA: NEGATIVE
Hgb urine dipstick: NEGATIVE
Ketones, ur: NEGATIVE
Protein, ur: NEGATIVE
Specific Gravity, Urine: 1.021
Urobilinogen, UA: 0.2
Urobilinogen, UA: 0.2
pH: 5

## 2011-03-16 LAB — URINE MICROSCOPIC-ADD ON

## 2011-03-16 LAB — COMPREHENSIVE METABOLIC PANEL
ALT: 26
AST: 32
Alkaline Phosphatase: 50
Calcium: 9.7
GFR calc Af Amer: >60
Potassium: 3.6
Sodium: 141
Total Protein: 7.2

## 2011-03-16 LAB — URINE CULTURE
Colony Count: 40000
Culture: NO GROWTH

## 2011-03-16 LAB — CBC
Hemoglobin: 14.9
MCHC: 33.7
RBC: 4.86
RDW: 14.8

## 2011-03-16 LAB — DIFFERENTIAL
Basophils Relative: 1
Eosinophils Absolute: 0.1
Eosinophils Relative: 1
Lymphs Abs: 3.2
Monocytes Absolute: 0.7
Monocytes Relative: 8

## 2011-03-16 LAB — HEMOGLOBIN A1C
Hgb A1c MFr Bld: 6
Mean Plasma Glucose: 136

## 2011-03-16 LAB — HOMOCYSTEINE: Homocysteine: 9.6

## 2011-03-16 LAB — T4, FREE: Free T4: 1.46

## 2011-03-16 LAB — TSH: TSH: 0.183 — ABNORMAL LOW

## 2011-03-16 LAB — LIPID PANEL
Total CHOL/HDL Ratio: 2.8
VLDL: 36

## 2011-03-30 ENCOUNTER — Ambulatory Visit (INDEPENDENT_AMBULATORY_CARE_PROVIDER_SITE_OTHER): Payer: Medicare Other | Admitting: *Deleted

## 2011-03-30 ENCOUNTER — Encounter: Payer: Self-pay | Admitting: Internal Medicine

## 2011-03-30 ENCOUNTER — Ambulatory Visit: Payer: Medicare Other | Admitting: Internal Medicine

## 2011-03-30 ENCOUNTER — Ambulatory Visit (INDEPENDENT_AMBULATORY_CARE_PROVIDER_SITE_OTHER): Payer: Medicare Other | Admitting: Internal Medicine

## 2011-03-30 DIAGNOSIS — I495 Sick sinus syndrome: Secondary | ICD-10-CM

## 2011-03-30 DIAGNOSIS — I10 Essential (primary) hypertension: Secondary | ICD-10-CM

## 2011-03-30 DIAGNOSIS — I2510 Atherosclerotic heart disease of native coronary artery without angina pectoris: Secondary | ICD-10-CM

## 2011-03-30 DIAGNOSIS — I509 Heart failure, unspecified: Secondary | ICD-10-CM

## 2011-03-30 DIAGNOSIS — Z950 Presence of cardiac pacemaker: Secondary | ICD-10-CM

## 2011-03-30 LAB — PACEMAKER DEVICE OBSERVATION
AL THRESHOLD: 0.75 V
RV LEAD THRESHOLD: 0.75 V

## 2011-03-30 NOTE — Progress Notes (Signed)
HPI  Patient is a 77 year old who was previously followed by  [name].  She has a history of HTN, tachybrady syndrome (s/p PPM), CVA (postpartum), She has history of CAD with BMS to the RCA.  Last cath in December 2011 showed mild CAD in LCx and LAD.  The RCA had diffuse in-stent restenosis.  There was then total occlusion of the mid/distal RCA with extensive thrombus.  She underwent overlapping DES placement. She was last seen in cardiology clinic in December by  [name]. In the interval she has fallen and broken her hip (March). She is currently at home with her husband.  Her son said that she is not that active, that she is reluctant to be active. She denies chest pain.  Breathing is OK. Allergies  Allergen Reactions  . Iodine   . Iohexol      Code: HIVES, Desc: hives with nonionic cotrast 7 yrs ago ([place] iv benedryl given   . Peanut-Containing Drug Products     Current Outpatient Prescriptions  Medication Sig Dispense Refill  . alendronate (FOSAMAX) 70 MG tablet Take 70 mg by mouth every 7 (seven) days. Take in the morning with a full glass of water, on an empty stomach, and do not take anything else by mouth or lie down for the next 30 min.       . Ascorbic Acid (VITAMIN C) 500 MG tablet Take 500 mg by mouth daily.        Marland Kitchen aspirin 81 MG tablet Take 81 mg by mouth daily.        . Calcium Carbonate-Vit D-Min 600-400 MG-UNIT TABS Take by mouth 2 (two) times daily.        . citalopram (CELEXA) 20 MG tablet 1 tab daily      . clopidogrel (PLAVIX) 75 MG tablet Take 1 tablet (75 mg total) by mouth daily.  30 tablet  6  . furosemide (LASIX) 40 MG tablet Take 1 tablet (40 mg total) by mouth daily.  30 tablet  6  . isosorbide mononitrate (IMDUR) 60 MG 24 hr tablet Take 60 mg by mouth daily.        Marland Kitchen lisinopril (PRINIVIL,ZESTRIL) 20 MG tablet Take 1 tablet (20 mg total) by mouth daily.  30 tablet  6  . methimazole (TAPAZOLE) 5 MG tablet TAKE 1 TABLET 3 TIMES A WEEK (MON, WED & FRI)  15  tablet  2  . metoprolol (LOPRESSOR) 50 MG tablet Take 50 mg by mouth 2 (two) times daily.        . Multiple Vitamins-Minerals (CENTRUM SILVER) tablet Take 1 tablet by mouth daily.        . nitroGLYCERIN (NITROSTAT) 0.4 MG SL tablet Place 0.4 mg under the tongue every 5 (five) minutes as needed. For chest pain       . polyethylene glycol (GLYCOLAX) powder 1/2 capful with 8oz of water as needed for constipation       . simvastatin (ZOCOR) 80 MG tablet Take 80 mg by mouth daily.        . traMADol (ULTRAM-ER) 100 MG 24 hr tablet If needed      . spironolactone (ALDACTONE) 25 MG tablet Take 1 tablet (25 mg total) by mouth daily.  30 tablet  3    Past Medical History  Diagnosis Date  . Lumbar back pain   . Other urinary incontinence   . Memory loss   . Depression   . Depression   . Hypertension   .  CVA (cerebral infarction)   . CHF (congestive heart failure)   . CAD (coronary artery disease)   . Presence of permanent cardiac pacemaker   . Osteoporosis   . Hypercholesterolemia   . Hyperthyroidism     Past Surgical History  Procedure Date  . Pacemaker insertion 2011  . Total knee arthroplasty 2002    bilateral  . Abdominal hysterectomy 1986    Family History  Problem Relation Age of Onset  . Thyroid disease Daughter   . Diabetes Other   . Hypertension Other   . Arthritis Other     History   Social History  . Marital Status: Married    Spouse Name: N/A    Number of Children: N/A  . Years of Education: N/A   Occupational History  . Not on file.   Social History Main Topics  . Smoking status: Never Smoker   . Smokeless tobacco: Not on file  . Alcohol Use: Not on file  . Drug Use: Not on file  . Sexually Active: Not on file   Other Topics Concern  . Not on file   Social History Narrative  . No narrative on file    Review of Systems:  All systems reviewed.  They are negative to the above problem except as previously stated.  Vital Signs: BP 100/66  Pulse 66   Ht 4\' 11"  (1.499 m)  Wt 168 lb (76.204 kg)  BMI 33.93 kg/m2  Physical Exam  Patient is in NAD.  Examined in chair  HEENT:  Normocephalic, atraumatic. EOMI, PERRLA.  Neck: JVP is normal. No thyromegaly. No bruits.  Lungs: clear to auscultation. No rales no wheezes.  Heart: Regular rate and rhythm. Normal S1, S2. No S3.   No significant murmurs. PMI not displaced.  Abdomen:  Supple, nontender. Normal bowel sounds. No masses. No hepatomegaly.  Extremities:   Good distal pulses throughout. No lower extremity edema.  Musculoskeletal :moving all extremities.  Neuro:   alert and oriented x3.  CN II-XII grossly intact.  Assessment and Plan:

## 2011-03-30 NOTE — Assessment & Plan Note (Signed)
No symptoms to suggest angina. 

## 2011-03-30 NOTE — Progress Notes (Signed)
PACER CHECK IN CLINIC  

## 2011-03-30 NOTE — Assessment & Plan Note (Signed)
Good control earlier this year.

## 2011-03-30 NOTE — Assessment & Plan Note (Signed)
Keep on current regimen. 

## 2011-03-31 ENCOUNTER — Encounter: Payer: Self-pay | Admitting: Internal Medicine

## 2011-04-10 ENCOUNTER — Telehealth: Payer: Self-pay | Admitting: *Deleted

## 2011-04-10 NOTE — Telephone Encounter (Signed)
Called patient's son to confirm that she is still taking Plavix. Advised him that her Lipid panel from [facility] looks good per Dr.Ross. No change in medication.

## 2011-04-12 ENCOUNTER — Other Ambulatory Visit: Payer: Self-pay | Admitting: Endocrinology

## 2011-04-21 ENCOUNTER — Other Ambulatory Visit: Payer: Self-pay

## 2011-04-21 MED ORDER — ASPIRIN 81 MG PO TABS: 81.0000 mg | ORAL_TABLET | Freq: Every day | ORAL | Status: DC

## 2011-05-21 IMAGING — CR DG CHEST 1V PORT
1 series · 1 of 1 positions shown · non-contrast
Comparison: Chest x-ray 11/10/2009.

CLINICAL DATA: Myocardial infarction.

PORTABLE CHEST - 1 VIEW

[AP]
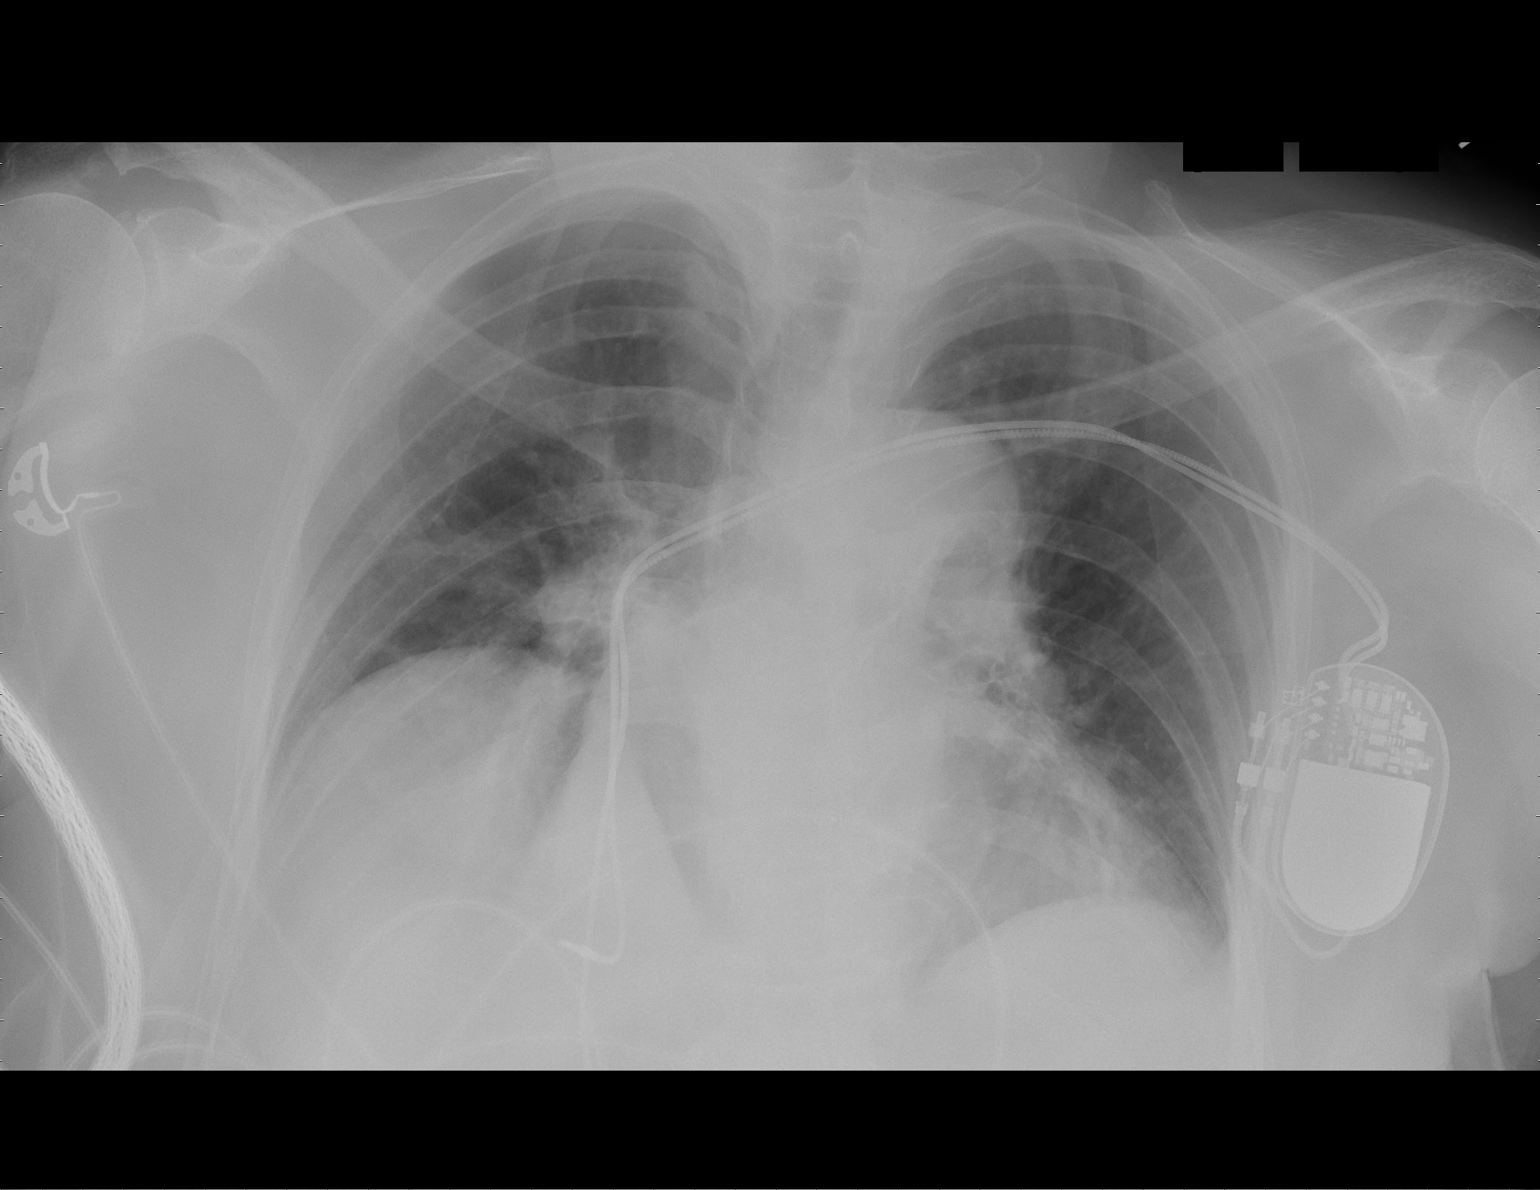

[1 of 1 positions shown; findings below may reference images not displayed]

FINDINGS: The pacer wires are stable.  The heart is enlarged but
unchanged.  Mild tortuosity and ectasia of the thoracic aorta is
noted.  Very low lung volumes with vascular crowding and bibasilar
atelectasis.  No edema or effusions.  Stable elevation  of the
right hemidiaphragm.  The bony thorax is grossly intact peri
IMPRESSION: Mild cardiac enlargement, stable.
Low lung volumes with vascular crowding and atelectasis.

## 2011-05-26 ENCOUNTER — Other Ambulatory Visit: Payer: Self-pay | Admitting: *Deleted

## 2011-05-26 MED ORDER — SPIRONOLACTONE 25 MG PO TABS: 25.0000 mg | ORAL_TABLET | Freq: Every day | ORAL | Status: DC

## 2011-05-26 NOTE — Telephone Encounter (Signed)
Refilled spironolactone

## 2011-06-02 ENCOUNTER — Other Ambulatory Visit: Payer: Self-pay | Admitting: *Deleted

## 2011-06-02 MED ORDER — ASPIRIN 81 MG PO TABS: 81.0000 mg | ORAL_TABLET | Freq: Every day | ORAL | Status: DC

## 2011-06-21 ENCOUNTER — Other Ambulatory Visit: Payer: Self-pay

## 2011-06-21 ENCOUNTER — Telehealth: Payer: Self-pay | Admitting: *Deleted

## 2011-06-21 ENCOUNTER — Encounter (HOSPITAL_COMMUNITY): Payer: Self-pay | Admitting: Emergency Medicine

## 2011-06-21 ENCOUNTER — Telehealth: Payer: Self-pay | Admitting: Internal Medicine

## 2011-06-21 ENCOUNTER — Emergency Department (HOSPITAL_COMMUNITY)
Admission: EM | Admit: 2011-06-21 | Discharge: 2011-06-21 | Disposition: A | Discharge status: Home / Self Care | Payer: Medicare Other | Attending: Emergency Medicine | Admitting: Emergency Medicine

## 2011-06-21 ENCOUNTER — Emergency Department (HOSPITAL_COMMUNITY): Payer: Medicare Other

## 2011-06-21 DIAGNOSIS — E0590 Thyrotoxicosis, unspecified without thyrotoxic crisis or storm: Secondary | ICD-10-CM | POA: Insufficient documentation

## 2011-06-21 DIAGNOSIS — M79609 Pain in unspecified limb: Secondary | ICD-10-CM

## 2011-06-21 DIAGNOSIS — Z7982 Long term (current) use of aspirin: Secondary | ICD-10-CM | POA: Insufficient documentation

## 2011-06-21 DIAGNOSIS — R0789 Other chest pain: Secondary | ICD-10-CM

## 2011-06-21 DIAGNOSIS — F3289 Other specified depressive episodes: Secondary | ICD-10-CM | POA: Insufficient documentation

## 2011-06-21 DIAGNOSIS — Z8673 Personal history of transient ischemic attack (TIA), and cerebral infarction without residual deficits: Secondary | ICD-10-CM | POA: Insufficient documentation

## 2011-06-21 DIAGNOSIS — I509 Heart failure, unspecified: Secondary | ICD-10-CM | POA: Insufficient documentation

## 2011-06-21 DIAGNOSIS — E7800 Pure hypercholesterolemia, unspecified: Secondary | ICD-10-CM | POA: Insufficient documentation

## 2011-06-21 DIAGNOSIS — Z79899 Other long term (current) drug therapy: Secondary | ICD-10-CM | POA: Insufficient documentation

## 2011-06-21 DIAGNOSIS — M79603 Pain in arm, unspecified: Secondary | ICD-10-CM

## 2011-06-21 DIAGNOSIS — I10 Essential (primary) hypertension: Secondary | ICD-10-CM | POA: Insufficient documentation

## 2011-06-21 DIAGNOSIS — I2510 Atherosclerotic heart disease of native coronary artery without angina pectoris: Secondary | ICD-10-CM | POA: Insufficient documentation

## 2011-06-21 DIAGNOSIS — R42 Dizziness and giddiness: Secondary | ICD-10-CM | POA: Insufficient documentation

## 2011-06-21 DIAGNOSIS — Z950 Presence of cardiac pacemaker: Secondary | ICD-10-CM | POA: Insufficient documentation

## 2011-06-21 DIAGNOSIS — F329 Major depressive disorder, single episode, unspecified: Secondary | ICD-10-CM | POA: Insufficient documentation

## 2011-06-21 DIAGNOSIS — M810 Age-related osteoporosis without current pathological fracture: Secondary | ICD-10-CM | POA: Insufficient documentation

## 2011-06-21 LAB — BASIC METABOLIC PANEL
BUN: 20 mg/dL (ref 6–23)
CO2: 27 mEq/L (ref 19–32)
Chloride: 102 mEq/L (ref 96–112)
Creatinine, Ser: 0.76 mg/dL (ref 0.50–1.10)
GFR calc Af Amer: >90 mL/min (ref >90)
Potassium: 4 mEq/L (ref 3.5–5.1)

## 2011-06-21 LAB — DIFFERENTIAL
Basophils Absolute: 0 10*3/uL (ref 0.0–0.1)
Basophils Relative: 1 % (ref 0–1)
Lymphocytes Relative: 41 % (ref 12–46)
Monocytes Absolute: 0.9 10*3/uL (ref 0.1–1.0)
Monocytes Relative: 10 % (ref 3–12)
Neutro Abs: 4.1 10*3/uL (ref 1.7–7.7)
Neutrophils Relative %: 47 % (ref 43–77)

## 2011-06-21 LAB — CARDIAC PANEL(CRET KIN+CKTOT+MB+TROPI)
Relative Index: INVALID (ref 0.0–2.5)
Relative Index: INVALID (ref 0.0–2.5)
Total CK: 69 U/L (ref 7–177)
Total CK: 82 U/L (ref 7–177)
Troponin I: <0.3 ng/mL (ref <0.30)
Troponin I: <0.3 ng/mL (ref <0.30)

## 2011-06-21 LAB — CBC
HCT: 41.2 % (ref 36.0–46.0)
Hemoglobin: 13.8 g/dL (ref 12.0–15.0)
MCHC: 33.5 g/dL (ref 30.0–36.0)
RBC: 4.52 MIL/uL (ref 3.87–5.11)
WBC: 8.6 10*3/uL (ref 4.0–10.5)

## 2011-06-21 NOTE — ED Notes (Signed)
Dr. Hyman Hopes is present with the patient

## 2011-06-21 NOTE — H&P (Signed)
[name] [id number] HPI: [name] is an 78 y.o. femalereferred for consultation by Johny Sax, MD   a 78 year old who was previously followed by S. Tennant. She has a history of HTN, tachybrady syndrome (s/p PPM), CVA (postpartum),  She has history of CAD with BMS to the RCA. Last cath in December 2011 showed mild CAD in LCx and LAD. The RCA had diffuse in-stent restenosis. There was then total occlusion of the mid/distal RCA with extensive thrombus. She underwent overlapping DES placement.  She was last seen in cardiology clinic in December by Dr. Deborah Chalk.  In the interval she has fallen and broken her hip (March).  She is currently at home with her husband. Her son said that she is not that active, that she is reluctant to be active.     Past Medical History  Diagnosis Date  . Lumbar back pain   . Other urinary incontinence   . Memory loss   . Depression   . Depression   . Hypertension   . CVA (cerebral infarction)   . CHF (congestive heart failure)   . CAD (coronary artery disease)   . Presence of permanent cardiac pacemaker   . Osteoporosis   . Hypercholesterolemia   . Hyperthyroidism     Past Surgical History  Procedure Date  . Pacemaker insertion 2011  . Total knee arthroplasty 2002    bilateral  . Abdominal hysterectomy 1986    Family History  Problem Relation Age of Onset  . Thyroid disease Daughter   . Diabetes Other   . Hypertension Other   . Arthritis Other     Social History:  reports that she has never smoked. She does not have any smokeless tobacco history on file. Her alcohol and drug histories not on file.  Allergies:  Allergies  Allergen Reactions  . Iodine Other (See Comments)    unknown  . Iohexol      Code: HIVES, Desc: hives with nonionic cotrast 7 yrs ago ([place] iv benedryl given   . Peanut-Containing Drug Products     Medications:   Results for orders placed during the hospital encounter of 06/21/11 (from the past 48  hour(s))  CARDIAC PANEL(CRET KIN+CKTOT+MB+TROPI)     Status: Normal   Collection Time   06/21/11 11:18 AM      Component Value Range Comment   Total CK 69  7 - 177 (U/L)    CK, MB 2.7  0.3 - 4.0 (ng/mL)    Troponin I <0.30  <0.30 (ng/mL)    Relative Index RELATIVE INDEX IS INVALID  0.0 - 2.5    CBC     Status: Normal   Collection Time   06/21/11 11:27 AM      Component Value Range Comment   WBC 8.6  4.0 - 10.5 (K/uL)    RBC 4.52  3.87 - 5.11 (MIL/uL)    Hemoglobin 13.8  12.0 - 15.0 (g/dL)    HCT 40.9  81.1 - 91.4 (%)    MCV 91.2  78.0 - 100.0 (fL)    MCH 30.5  26.0 - 34.0 (pg)    MCHC 33.5  30.0 - 36.0 (g/dL)    RDW 78.2  95.6 - 21.3 (%)    Platelets 199  150 - 400 (K/uL)   DIFFERENTIAL     Status: Normal   Collection Time   06/21/11 11:27 AM      Component Value Range Comment   Neutrophils Relative 47  43 - 77 (%)  Neutro Abs 4.1  1.7 - 7.7 (K/uL)    Lymphocytes Relative 41  12 - 46 (%)    Lymphs Abs 3.5  0.7 - 4.0 (K/uL)    Monocytes Relative 10  3 - 12 (%)    Monocytes Absolute 0.9  0.1 - 1.0 (K/uL)    Eosinophils Relative 2  0 - 5 (%)    Eosinophils Absolute 0.1  0.0 - 0.7 (K/uL)    Basophils Relative 1  0 - 1 (%)    Basophils Absolute 0.0  0.0 - 0.1 (K/uL)   BASIC METABOLIC PANEL     Status: Abnormal   Collection Time   06/21/11 11:27 AM      Component Value Range Comment   Sodium 139  135 - 145 (mEq/L)    Potassium 4.0  3.5 - 5.1 (mEq/L)    Chloride 102  96 - 112 (mEq/L)    CO2 27  19 - 32 (mEq/L)    Glucose, Bld 89  70 - 99 (mg/dL)    BUN 20  6 - 23 (mg/dL)    Creatinine, Ser 1.61  0.50 - 1.10 (mg/dL)    Calcium 9.6  8.4 - 10.5 (mg/dL)    GFR calc non Af Amer 79 (*) >90 (mL/min)    GFR calc Af Amer >90  >90 (mL/min)   CARDIAC PANEL(CRET KIN+CKTOT+MB+TROPI)     Status: Normal   Collection Time   06/21/11  2:08 PM      Component Value Range Comment   Total CK 82  7 - 177 (U/L)    CK, MB 2.8  0.3 - 4.0 (ng/mL)    Troponin I <0.30  <0.30 (ng/mL)    Relative Index  RELATIVE INDEX IS INVALID  0.0 - 2.5      Dg Chest 2 View  06/21/2011  *RADIOLOGY REPORT*  Clinical Data: Chest pain  CHEST - 2 VIEW  Comparison: 08/20/2010  Findings: Mild accentuation of the expected thoracic kyphosis. Moderate osteopenia.  Minimal wedging of a lower thoracic vertebral body.  Dual lead pacer with leads right atrium right ventricle. Midline trachea.  Mild cardiomegaly with tortuous thoracic aorta. No pleural effusion or pneumothorax.  No congestive failure.  Mild bibasilar scar or atelectasis.  Lungs are otherwise clear.  IMPRESSION:  1.  Cardiomegaly without acute disease. 2.  Mild osteopenia with minimal wedging of a lower thoracic vertebral body. Per CMS PQRS reporting requirements (PQRS Measure 24): Given the patient's age of greater than 50 and the fracture site (hip, distal radius, or spine), the patient should be tested for osteoporosis using DXA, and the appropriate treatment considered based on the DXA results.  Original Report Authenticated By: Consuello Bossier, M.D.   General: no anorexia, weight gain or weight loss Cardiac: no chest pain, dyspnea, orthopnea, PND,  Syncope or palpitations HEENT normal, DM/Thyroid neg Cancer negative Respiratory: no COPD or hemoptysis GI: no nausea, abdominal pain, emesis, diarrhea or constipations Neurologic: No muscle weakness or paralysis; no speech disturbance; no headache 56  Blood pressure 111/62, pulse 60, temperature 97.8 F (36.6 C), temperature source Oral, resp. rate 21, SpO2 99.00%. General-Well-developed; no acute distress HEENT-[place]/AT; PERRL; EOM intact; conjunctiva and lids nl Neck-No JVD; no carotid bruits Endocrine-No thyromegaly Lungs-Clear lung fields; resonant percussion; normal I-to-E ratio Cardiovascular- normal PMI; normal S1 and S2 Abdomen-BS normal; soft and non-tender without masses or organomegaly Musculoskeletal-No deformities, cyanosis or clubbing Neurologic-Nl cranial nerves; symmetric strength and  tone Skin- Warm, no significant lesions Extremities-Nl distal pulses;  no edema    Sherryl Manges, MD 06/21/2011, 3:34 PM

## 2011-06-21 NOTE — Telephone Encounter (Signed)
Will forward to dr Tenny Craw and Durwin Reges  for fyi.

## 2011-06-21 NOTE — ED Provider Notes (Signed)
5:45 PM Patient care resumed from Dr. Hyman Hopes.  Patient presented with atypical chest pain and shoulder pain that has been evaluated by Dr. Tenny Craw of cardiology.  Per his plan patient will be discharged from CDU after 3 rounds of negative cardiac enzymes.  Pt will be discharged home with aleve for msk pain and with both primary care and PMD f/u.   Patient's vital signs reviewed prior to discharge, is hemodynamically stable and in no acute distress.  Patient verbalizes understanding for followup plan      Jaci Carrel, [place] 06/21/11 [phone]

## 2011-06-21 NOTE — Telephone Encounter (Signed)
Pt took nitro, bp was 128/81 prior and post nitro was 83/52, son was aware that her bp drops after nitro, she continues with left arm pain and now states having chest heaviness. Told them to call 911, family agreed to call.

## 2011-06-21 NOTE — ED Notes (Signed)
Patient not in room.  Family present, they did not need anything at this time.

## 2011-06-21 NOTE — ED Notes (Signed)
Pt with 2 days hx left arm pain tingling occasionally radiating to substernal chest. This AM pt c/o dizziness.  Denies SOB, N/V. 20g LAC, 324mg  ASA, nitro sl x1 with no relief

## 2011-06-21 NOTE — Consult Note (Signed)
Patient ID: [name] MRN: [id number], SOB: December 01, 1931 78 y.o. Date of Encounter: 06/21/2011, 2:53 PM  Primary Physician: Shannon Durand, MD Primary Cardiologist: Shannon Pates MD  Reason for Consult: Left arm pain   HPI: 79yof w/ PMHx significant for HLD, HTN, Diastolic CHF (EF 40% by cath '11), CAD (s/p BMS to RCA 1999; NSTEMI May '11; Inf STEMI Dec '11 - cath w/ mild CAD in LCx & LAD, RCA w/ diffuse ISR w/ total occlusion of mid/distal RCA w/ extensive thrombus, s/p overlapping DES to RCA), Tachy/brady syndrome w/ aflutter & syncope (s/p Medtronic Adapta dual chamber PPM '11), hyperthyroidism, and CVA who presented to [facility] on 06/21/11 with complaints of left arm pain.  Patient speaks [place] and minimal English but her son is at the bedside and speaks english. They report that she woke up Tuesday (06/20/11) around 1am to go to the bathroom and complained to her husband of left arm pain, but was able to go to sleep. The pain is worse with movement and palpation mostly at the shoulder and biceps area. Denies associated shortness of breath, nausea, diaphoresis, focal weakness, slurred speech, facial droop, dizziness, presyncope, fall or other injury. She had some left upper chest pain around where her pacemaker is, but denies any now. The arm pain continued until this morning and when she told her son he called the [name] office. She was told to take one nitro, which she did, without relief. He then brought her to the ED.   In the ED her EKG revealed an atrial paced rhythm w/ prolonged AV conduction, LAD, ST/T wave abnl in I, aVL and TWI V5, V6 but unchanged from previous EKG, CXR showed cardiomegaly without acute findings and her initial cardiac enzyme is negative. She continues to c/o dull upper left arm pain.  Diagnosis  . Lumbar back pain  . Other urinary incontinence  . Memory loss  . Depression  . Depression  . Hypertension  . CVA (cerebral infarction)  . CHF  (congestive heart failure)  . CAD (coronary artery disease)  . Presence of permanent cardiac pacemaker  . Osteoporosis  . Hypercholesterolemia  . Hyperthyroidism    Surgical History:  Procedure Date  . Pacemaker insertion 2011  . Total knee arthroplasty 2002    bilateral  . Abdominal hysterectomy 1986     Home Meds: Medication Sig  alendronate (FOSAMAX) 70 MG tablet Take 70 mg by mouth every 7 (seven) days. Take in the morning with a full glass of water, on an empty stomach, and do not take anything else by mouth or lie down for the next 30 min. Takes on Fridays  Ascorbic Acid (VITAMIN C) 500 MG tablet Take 500 mg by mouth daily.    aspirin 81 MG tablet Take 81 mg by mouth daily.    Calcium Carbonate-Vit D-Min 600-400 MG-UNIT TABS Take by mouth 2 (two) times daily.    citalopram (CELEXA) 20 MG tablet Take 20 mg by mouth daily.   clopidogrel (PLAVIX) 75 MG tablet Take 1 tablet (75 mg total) by mouth daily.  furosemide (LASIX) 40 MG tablet Take 1 tablet (40 mg total) by mouth daily.  lisinopril (PRINIVIL,ZESTRIL) 20 MG tablet Take 20 mg by mouth 2 (two) times daily.    methimazole (TAPAZOLE) 5 MG tablet TAKE 1 TABLET 3 TIMES A WEEK (MON, WED & FRI)  metoprolol tartrate (LOPRESSOR) 25 MG tablet Take 25 mg by mouth 2 (two) times daily.    Multiple Vitamins-Minerals (CENTRUM SILVER) tablet  Take 1 tablet by mouth daily.    nitroGLYCERIN (NITROSTAT) 0.4 MG SL tablet Place 0.4 mg under the tongue every 5 (five) minutes as needed. For chest pain   polyethylene glycol (GLYCOLAX) powder Take 17 g by mouth daily as needed. constipation  simvastatin (ZOCOR) 40 MG tablet Take 40 mg by mouth at bedtime.    spironolactone (ALDACTONE) 25 MG tablet Take 1 tablet (25 mg total) by mouth daily.  traMADol (ULTRAM-ER) 100 MG 24 hr tablet Take 100 mg by mouth 2 (two) times daily as needed. For pain   Allergies:  Allergen Reactions  . Iodine Other (See Comments)    unknown  . Iohexol      Code: HIVES,  Desc: hives with nonionic cotrast 7 yrs ago ([place] iv benedryl given   . Peanut-Containing Drug Products    Social History  . Marital Status: Married   Lives with husband in apartment attached to sons house    Occupational History  . Retired   Social History Main Topics  . Smoking status: Never Smoker   . Alcohol Use: None  . Drug Use: None   Problem Relation  . Thyroid disease Daughter  . Diabetes Other  . Hypertension Other  . Arthritis Other    Review of Systems: General: negative for chills, fever, night sweats or weight changes.  Cardiovascular: As per HPI; negative for dyspnea on exertion, edema, orthopnea, palpitations, paroxysmal nocturnal dyspnea, or shortness of breath Dermatological: negative for rash Respiratory: negative for cough or wheezing Urologic: negative for hematuria Abdominal: negative for nausea, vomiting, diarrhea, bright red blood per rectum, melena, or hematemesis Neurologic: negative for visual changes, syncope, or dizziness All other systems reviewed and are otherwise negative except as noted above.  Labs:   Lab Results  Component Value Date   WBC 8.6 06/21/2011   HGB 13.8 06/21/2011   HCT 41.2 06/21/2011   MCV 91.2 06/21/2011   PLT 199 06/21/2011    Lab 06/21/11 1127  NA 139  K 4.0  CL 102  CO2 27  BUN 20  CREATININE 0.76  CALCIUM 9.6  GLUCOSE 89    06/21/2011 11:18 06/21/2011 14:08  CK, MB 2.7 2.8  CK Total 69 82  Troponin I <0.30 <0.30   Radiology/Studies:  Dg Chest 2 View 06/21/2011   Findings: Mild accentuation of the expected thoracic kyphosis. Moderate osteopenia.  Minimal wedging of a lower thoracic vertebral body.  Dual lead pacer with leads right atrium right ventricle. Midline trachea.  Mild cardiomegaly with tortuous thoracic aorta. No pleural effusion or pneumothorax.  No congestive failure.  Mild bibasilar scar or atelectasis.  Lungs are otherwise clear.  IMPRESSION:  1.  Cardiomegaly without acute disease. 2.  Mild osteopenia with  minimal wedging of a lower thoracic vertebral body. Per CMS PQRS reporting requirements (PQRS Measure 24): Given the patient's age of greater than 50 and the fracture site (hip, distal radius, or spine), the patient should be tested for osteoporosis using DXA, and the appropriate treatment considered based on the DXA results.    EKG: 06/21/11 @ 1100 - Atrial paced rhythm w/ prolonged AV conduction, LAD, ST/T wave abnl in I, aVL and TWI V5, V6 but unchanged from previous EKG  Physical Exam: Blood pressure 110/69, pulse 73, temperature 97.8 F (36.6 C), temperature source Oral, resp. rate 22, SpO2 96.00%. General: Well developed, well nourished, overweight, elderly white female in no acute distress. Head: Normocephalic, atraumatic, sclera non-icteric, nares are without discharge Neck: Supple. Negative for carotid  bruits. JVD not elevated. Lungs: Clear bilaterally to auscultation without wheezes, rales, or rhonchi. Breathing is unlabored. Heart: RRR with S1 S2. No murmurs, rubs, or gallops appreciated. Abdomen: Soft, non-tender, non-distended with normoactive bowel sounds. No hepatomegaly. No rebound/guarding. No obvious abdominal masses. Msk:  Point tenderness over biceps tendon and over bicep. Worse with movement. Strength and tone appear normal for age. Extremities: No clubbing or cyanosis. No edema.  Distal pedal pulses are 2+ and equal bilaterally. Neuro: Alert and oriented X 3. Moves all extremities spontaneously. No slurred speech, facial droop, arm droop or focal weakness Psych:  Responds to questions appropriately with a normal affect.   ASSESSMENT AND PLAN:  79yof w/ PMHx significant for HLD, HTN, Diastolic CHF (EF 14% by cath '11), CAD (s/p BMS to RCA 1999; NSTEMI May '11; Inf STEMI Dec '11 - cath w/ mild CAD in LCx & LAD, RCA w/ diffuse ISR w/ total occlusion of mid/distal RCA w/ extensive thrombus, s/p overlapping DES to RCA), Tachy/brady syndrome w/ aflutter & syncope (s/p Medtronic  Adapta dual chamber PPM '11), hyperthyroidism, and CVA who presented to [facility] on [facility]/21[facility]11 with complaints of left arm pain.   1. Left Arm Pain: Patient with atypical symptoms for 48hrs. Most likely musculoskeletal in nature. EKG without objective evidence of ischemia, two negative sets of cardiac enzymes, and CXR without acute findings. Recommend monitoring in the ED and obtaining another set of cardiac enzymes 6hrs after the first. If this remains negative she can go home with an antiinflammatory medication and follow up with Dr. Tenny Craw as an outpatient.    Signed, HOPE, JESSICA PA-C 06/21/2011, 2:53 PM  Pt with signicant Past CAD history with multiple Procedures, most recently a year ago with DES placed.  She also has tachybrady syndrome s/p pacemaker insertion.   She complains today of 36 hrs of nonstop aching in her L arm near the shoulder.  It is aggravated by the moving of her arm and the palpation of the shoulder but not by walking  THere has been no accompanying SOB diaphoresis or chest pains.    PE notable for tenderness of the L biceps tendon. O/w normal   ECG shows A pacing with long PR and no acute changes Troponins on arrival and 3hr are neg  Notwithstanding her prior history, the story and exam are so consisitent with MKS pain that if 6 hr troponins are normal, I would feel comfortable with discharge of the patient home with short term NSAID use-prob naproxen given its safety profile in CAD patients.    Followup as scheduled with Dr Tenny Craw would be appropriate

## 2011-06-21 NOTE — ED Notes (Signed)
Admitting team at bedside with patient.

## 2011-06-21 NOTE — ED Notes (Signed)
Pt reports dull pain to left upper extremity, without numbness or tingling. Admitting physician at bedside. NNO at this time

## 2011-06-21 NOTE — ED Notes (Signed)
Pt complains of pain in left shoulder and left lateral arm . States pain is "not so much" at this time. Denies chest pain. Cms intact left arm. Lungs are clear

## 2011-06-21 NOTE — ED Provider Notes (Signed)
History     CSN: [id number]  Arrival date & time 06/21/11  1052   First MD Initiated Contact with Patient 06/21/11 1115      Chief Complaint  Patient presents with  . Chest Pain    (Consider location/radiation/quality/duration/timing/severity/associated sxs/prior treatment) HPI  79yoF h/o CHF, CAD s/p stent placement, HTN, HLD, pacemaker pw chest pressure/Lt arm pain. Language barrier- son used for interpretation. Patient woke up with the pain yesterday morning. It had been constant until today- the pain has resolved. +radiation to her left arm. No radiation to her back. She denies numbness/tingling/weakness of her extremities. This does not feel like previous ACS. No relief with asa and ntg pta. She denies SOB but states that she felt lightheaded this morning. Son also states that was near the time she received ntg-- he noted that her blood pressure was lower at that time. Denies fever/chills/cough. Denies other complaints at this time.   ED Notes, ED Provider Notes from 06/21/11 0000 to 06/21/11 10:54:57       Bo Mcclintock, RN 06/21/2011 10:54      Pt with 2 days hx left arm pain tingling occasionally radiating to substernal chest. This AM pt c/o dizziness. Denies SOB, N/V. 20g LAC, 324mg  ASA, nitro sl x1 with no relief    Past Medical History  Diagnosis Date  . Lumbar back pain   . Other urinary incontinence   . Memory loss   . Depression   . Depression   . Hypertension   . CVA (cerebral infarction)   . CHF (congestive heart failure)   . CAD (coronary artery disease)   . Presence of permanent cardiac pacemaker   . Osteoporosis   . Hypercholesterolemia   . Hyperthyroidism     Past Surgical History  Procedure Date  . Pacemaker insertion 2011  . Total knee arthroplasty 2002    bilateral  . Abdominal hysterectomy 1986    Family History  Problem Relation Age of Onset  . Thyroid disease Daughter   . Diabetes Other   . Hypertension Other   . Arthritis Other      History  Substance Use Topics  . Smoking status: Never Smoker   . Smokeless tobacco: Not on file  . Alcohol Use: Not on file    OB History    Grav Para Term Preterm Abortions TAB SAB Ect Mult Living                  Review of Systems  All other systems reviewed and are negative.  except as noted HPI   Allergies  Iodine; Iohexol; and Peanut-containing drug products  Home Medications   Current Outpatient Rx  Name Route Sig Dispense Refill  . ALENDRONATE SODIUM 70 MG PO TABS Oral Take 70 mg by mouth every 7 (seven) days. Take in the morning with a full glass of water, on an empty stomach, and do not take anything else by mouth or lie down for the next 30 min. Takes on Fridays    . VITAMIN C 500 MG PO TABS Oral Take 500 mg by mouth daily.      . ASPIRIN 81 MG PO TABS Oral Take 81 mg by mouth daily.      Marland Kitchen CALCIUM CARBONATE-VIT D-MIN 600-400 MG-UNIT PO TABS Oral Take by mouth 2 (two) times daily.      Marland Kitchen CITALOPRAM HYDROBROMIDE 20 MG PO TABS Oral Take 20 mg by mouth daily.     Marland Kitchen CLOPIDOGREL BISULFATE 75 MG  PO TABS Oral Take 1 tablet (75 mg total) by mouth daily. 30 tablet 6  . FUROSEMIDE 40 MG PO TABS Oral Take 1 tablet (40 mg total) by mouth daily. 30 tablet 6  . LISINOPRIL 20 MG PO TABS Oral Take 20 mg by mouth 2 (two) times daily.      Marland Kitchen METHIMAZOLE 5 MG PO TABS  TAKE 1 TABLET 3 TIMES A WEEK (MON, WED & FRI) 15 tablet 0  . METOPROLOL TARTRATE 25 MG PO TABS Oral Take 25 mg by mouth 2 (two) times daily.      . CENTRUM SILVER PO TABS Oral Take 1 tablet by mouth daily.      Marland Kitchen NITROGLYCERIN 0.4 MG SL SUBL Sublingual Place 0.4 mg under the tongue every 5 (five) minutes as needed. For chest pain     . POLYETHYLENE GLYCOL 3350 PO POWD Oral Take 17 g by mouth daily as needed. constipation    . SIMVASTATIN 40 MG PO TABS Oral Take 40 mg by mouth at bedtime.      . SPIRONOLACTONE 25 MG PO TABS Oral Take 1 tablet (25 mg total) by mouth daily. 30 tablet 3  . TRAMADOL HCL ER 100 MG PO  TB24 Oral Take 100 mg by mouth 2 (two) times daily as needed. For pain      BP 105/62  Pulse 60  Temp(Src) 97.8 F (36.6 C) (Oral)  Resp 16  SpO2 99%  Physical Exam  Nursing note and vitals reviewed. Constitutional: She is oriented to person, place, and time. She appears well-developed.  HENT:  Head: Atraumatic.  Mouth/Throat: Oropharynx is clear and moist.  Eyes: Conjunctivae and EOM are normal. Pupils are equal, round, and reactive to light.  Neck: Normal range of motion. Neck supple.  Cardiovascular: Normal rate, regular rhythm, normal heart sounds and intact distal pulses.   Pulmonary/Chest: Effort normal and breath sounds normal. No respiratory distress. She has no wheezes. She has no rales. She exhibits tenderness.       Min Lt cw/shoulder ttp over pacemaker and Lt anterior shoulder/biceps tendon  Abdominal: Soft. She exhibits no distension. There is no tenderness. There is no rebound and no guarding.  Musculoskeletal: Normal range of motion. She exhibits no edema and no tenderness.  Neurological: She is alert and oriented to person, place, and time.       Strength 5/5 all extremities No pronator drift No facial droop    Skin: Skin is warm and dry. No rash noted.  Psychiatric: She has a normal mood and affect.    Date: 06/21/2011  Rate: 63  Rhythm: normal sinus rhythm  QRS Axis: left  Intervals: normal  ST/T Wave abnormalities: nonspecific T wave changes  Conduction Disutrbances:none  Narrative Interpretation:   Old EKG Reviewed: no significant change from previous   ED Course  Procedures (including critical care time)  Labs Reviewed  BASIC METABOLIC PANEL - Abnormal; Notable for the following:    GFR calc non Af Amer 79 (*)    All other components within normal limits  CBC  DIFFERENTIAL  CARDIAC PANEL(CRET KIN+CKTOT+MB+TROPI)  CARDIAC PANEL(CRET KIN+CKTOT+MB+TROPI)  CARDIAC PANEL(CRET KIN+CKTOT+MB+TROPI)   Dg Chest 2 View  06/21/2011  *RADIOLOGY REPORT*   Clinical Data: Chest pain  CHEST - 2 VIEW  Comparison: 08/20/2010  Findings: Mild accentuation of the expected thoracic kyphosis. Moderate osteopenia.  Minimal wedging of a lower thoracic vertebral body.  Dual lead pacer with leads right atrium right ventricle. Midline trachea.  Mild cardiomegaly with  tortuous thoracic aorta. No pleural effusion or pneumothorax.  No congestive failure.  Mild bibasilar scar or atelectasis.  Lungs are otherwise clear.  IMPRESSION:  1.  Cardiomegaly without acute disease. 2.  Mild osteopenia with minimal wedging of a lower thoracic vertebral body. Per CMS PQRS reporting requirements (PQRS Measure 24): Given the patient's age of greater than 50 and the fracture site (hip, distal radius, or spine), the patient should be tested for osteoporosis using DXA, and the appropriate treatment considered based on the DXA results.  Original Report Authenticated By: Consuello Bossier, M.D.    1. Chest pressure   2. Arm pain     MDM  Atypical chest pain v/s shoulder pain. Patient high risk for ACS but cardiac enzymes negative x 2 here in the Emergency Department. She has been seen by cardiology who recommends one more set of cardiac enzymes-- 6 hours from first--If third set negative, she will be discharged home with aleve for msk pain and with both primary care and PMD f/u.  Patient transferred to CDU. Discussed f/u with [place], [place]       Forbes Cellar, MD 06/21/11 [phone]

## 2011-06-21 NOTE — Telephone Encounter (Signed)
Son speakes english and called this am stating his mom had cp yesterday, no cp today but c/o left arm pain with movement. Denies nausea, denies cp with movement. Pt to take a nitro and i will call back to see if it was helpful.

## 2011-06-22 ENCOUNTER — Other Ambulatory Visit: Payer: Self-pay | Admitting: Nurse Practitioner

## 2011-06-23 NOTE — ED Provider Notes (Signed)
Medical screening examination/treatment/procedure(s) were conducted as a shared visit with non-physician practitioner(s) and myself.  I personally evaluated the patient during the encounter  See original note  Forbes Cellar, MD 06/23/11 [phone]

## 2011-07-11 ENCOUNTER — Other Ambulatory Visit: Payer: Self-pay | Admitting: Nurse Practitioner

## 2011-07-12 ENCOUNTER — Other Ambulatory Visit: Payer: Self-pay | Admitting: Nurse Practitioner

## 2011-07-24 ENCOUNTER — Ambulatory Visit (INDEPENDENT_AMBULATORY_CARE_PROVIDER_SITE_OTHER): Payer: Medicare Other | Admitting: Internal Medicine

## 2011-07-24 ENCOUNTER — Encounter: Payer: Self-pay | Admitting: Internal Medicine

## 2011-07-24 DIAGNOSIS — E7800 Pure hypercholesterolemia, unspecified: Secondary | ICD-10-CM

## 2011-07-24 DIAGNOSIS — I2510 Atherosclerotic heart disease of native coronary artery without angina pectoris: Secondary | ICD-10-CM

## 2011-07-24 DIAGNOSIS — I10 Essential (primary) hypertension: Secondary | ICD-10-CM

## 2011-07-24 MED ORDER — LISINOPRIL 10 MG PO TABS: 10.0000 mg | ORAL_TABLET | Freq: Every day | ORAL | Status: DC

## 2011-07-24 NOTE — Patient Instructions (Signed)
STOP Metoprolol  Decrease Lisinopril to 10mg  every day.

## 2011-07-24 NOTE — Progress Notes (Signed)
HPI Patient is a 78 year old who was previously followed by [facility]. [facility]. She has a history of HTN, tachybrady syndrome (s/p PPM), CVA (postpartum),  She has history of CAD with BMS to the RCA. Last cath in December 2011 showed mild CAD in LCx and LAD. The RCA had diffuse in-stent restenosis. There was then total occlusion of the mid/distal RCA with extensive thrombus. She underwent overlapping DES placement.  She broke her hip in March 2012.  I saw her in clinic in October 2012. She was in the ER on 1/2 with CP that was felt to be atypical for angina.  Ck/trop were negative.  Sent home.  BP has been running lower  95 to 105.  Husband has been holding her PM dose of meds.  She complains of just feeling tired.  Son says she doesn't do much during the day. She denies CP.  They think that episode of CP may have been related to her straining to lift bedrail.  Pulled muscle. Son says she is eatting.  Husband says she usually drinks adequate fluids.   Allergies  Allergen Reactions  . Iodine Other (See Comments)    unknown  . Iohexol      Code: HIVES, Desc: hives with nonionic cotrast 7 yrs ago ([place] iv benedryl given   . Peanut-Containing Drug Products     Current Outpatient Prescriptions  Medication Sig Dispense Refill  . alendronate (FOSAMAX) 70 MG tablet Take 70 mg by mouth every 7 (seven) days. Take in the morning with a full glass of water, on an empty stomach, and do not take anything else by mouth or lie down for the next 30 min. Takes on Fridays      . Ascorbic Acid (VITAMIN C) 500 MG tablet Take 500 mg by mouth daily.        Marland Kitchen aspirin 81 MG tablet Take 81 mg by mouth daily.        . Calcium Carbonate-Vit D-Min 600-400 MG-UNIT TABS Take by mouth 2 (two) times daily.        . citalopram (CELEXA) 20 MG tablet Take 20 mg by mouth daily.       . clopidogrel (PLAVIX) 75 MG tablet TAKE 1 TABLET (75 MG TOTAL) BY MOUTH DAILY.  30 tablet  6  . furosemide (LASIX) 40 MG tablet Take 1 tablet (40 mg  total) by mouth daily.  30 tablet  6  . lisinopril (PRINIVIL,ZESTRIL) 20 MG tablet TAKE 1 TABLET TWICE A DAY  60 tablet  6  . methimazole (TAPAZOLE) 5 MG tablet TAKE 1 TABLET 3 TIMES A WEEK (MON, WED & FRI)  15 tablet  0  . metoprolol tartrate (LOPRESSOR) 25 MG tablet TAKE 1 TABLET TWICE A DAY  60 tablet  6  . Multiple Vitamins-Minerals (CENTRUM SILVER) tablet Take 1 tablet by mouth daily.        . nitroGLYCERIN (NITROSTAT) 0.4 MG SL tablet Place 0.4 mg under the tongue every 5 (five) minutes as needed. For chest pain       . polyethylene glycol (GLYCOLAX) powder Take 17 g by mouth daily as needed. constipation      . simvastatin (ZOCOR) 40 MG tablet TAKE 1 TABLET EVERY DAY  30 tablet  6  . spironolactone (ALDACTONE) 25 MG tablet Take 1 tablet (25 mg total) by mouth daily.  30 tablet  3  . traMADol (ULTRAM-ER) 100 MG 24 hr tablet Take 100 mg by mouth 2 (two) times daily as needed.  For pain        Past Medical History  Diagnosis Date  . Lumbar back pain   . Other urinary incontinence   . Memory loss   . Depression   . Depression   . Hypertension   . CVA (cerebral infarction)   . CHF (congestive heart failure)   . CAD (coronary artery disease)   . Presence of permanent cardiac pacemaker   . Osteoporosis   . Hypercholesterolemia   . Hyperthyroidism     Past Surgical History  Procedure Date  . Pacemaker insertion 2011  . Total knee arthroplasty 2002    bilateral  . Abdominal hysterectomy 1986    Family History  Problem Relation Age of Onset  . Thyroid disease Daughter   . Diabetes Other   . Hypertension Other   . Arthritis Other     History   Social History  . Marital Status: Married    Spouse Name: N/A    Number of Children: N/A  . Years of Education: N/A   Occupational History  . Not on file.   Social History Main Topics  . Smoking status: Never Smoker   . Smokeless tobacco: Not on file  . Alcohol Use: Not on file  . Drug Use: Not on file  . Sexually Active:  Not on file   Other Topics Concern  . Not on file   Social History Narrative  . No narrative on file    Review of Systems:  All systems reviewed.  They are negative to the above problem except as previously stated.  Vital Signs: There were no vitals taken for this visit. Marland Kitchen Physical Exam Filed Vitals:   07/24/11 1210  Height: 4\' 11"  (1.499 m)  Weight: 172 lb (78.019 kg)   Filed Vitals:   07/24/11 1210  BP: 95/70  Pulse: 65   Patient is in NAD.  Examined in chair.  HEENT:  Normocephalic, atraumatic. EOMI, PERRLA.  Neck: JVP is normal. No thyromegaly. No bruits.  Lungs: clear to auscultation. No rales no wheezes.  Heart: Regular rate and rhythm. Normal S1, S2. No S3.   No significant murmurs. PMI not displaced.  Abdomen:  Supple, nontender. Normal bowel sounds. No masses. No hepatomegaly.  Extremities:   Good distal pulses throughout. No lower extremity edema.  Musculoskeletal :moving all extremities.  Neuro:   alert and oriented x3.  CN II-XII grossly intact.   Assessment and Plan:

## 2011-07-24 NOTE — Assessment & Plan Note (Signed)
No symptoms of angina.  FOllow.

## 2011-07-24 NOTE — Assessment & Plan Note (Signed)
Continue simvistatin

## 2011-07-24 NOTE — Assessment & Plan Note (Signed)
BP is running a little lower than it has been.  I would recomm stopping b blocker and decreasing ACE I to 10 mg per day.  Follow BP.

## 2011-08-08 ENCOUNTER — Other Ambulatory Visit: Payer: Self-pay | Admitting: Internal Medicine

## 2011-08-08 DIAGNOSIS — Z780 Asymptomatic menopausal state: Secondary | ICD-10-CM

## 2011-08-15 ENCOUNTER — Ambulatory Visit
Admission: RE | Admit: 2011-08-15 | Discharge: 2011-08-15 | Disposition: A | Discharge status: Home / Self Care | Payer: Medicare Other | Source: Ambulatory Visit | Attending: Internal Medicine | Admitting: Internal Medicine

## 2011-08-15 DIAGNOSIS — Z780 Asymptomatic menopausal state: Secondary | ICD-10-CM

## 2011-09-18 ENCOUNTER — Other Ambulatory Visit: Payer: Self-pay | Admitting: Internal Medicine

## 2011-09-20 ENCOUNTER — Other Ambulatory Visit: Payer: Self-pay

## 2011-09-20 ENCOUNTER — Encounter (HOSPITAL_COMMUNITY): Payer: Self-pay | Admitting: *Deleted

## 2011-09-20 ENCOUNTER — Emergency Department (HOSPITAL_COMMUNITY): Payer: Medicare Other

## 2011-09-20 ENCOUNTER — Inpatient Hospital Stay (HOSPITAL_COMMUNITY)
Admission: EM | Admit: 2011-09-20 | Discharge: 2011-09-22 | DRG: 065 | Disposition: A | Discharge status: Home Health | Payer: Medicare Other | Attending: Internal Medicine | Admitting: Internal Medicine

## 2011-09-20 DIAGNOSIS — Z9861 Coronary angioplasty status: Secondary | ICD-10-CM

## 2011-09-20 DIAGNOSIS — M810 Age-related osteoporosis without current pathological fracture: Secondary | ICD-10-CM | POA: Diagnosis present

## 2011-09-20 DIAGNOSIS — Z7902 Long term (current) use of antithrombotics/antiplatelets: Secondary | ICD-10-CM

## 2011-09-20 DIAGNOSIS — R29898 Other symptoms and signs involving the musculoskeletal system: Secondary | ICD-10-CM | POA: Diagnosis present

## 2011-09-20 DIAGNOSIS — I509 Heart failure, unspecified: Secondary | ICD-10-CM | POA: Diagnosis present

## 2011-09-20 DIAGNOSIS — R32 Unspecified urinary incontinence: Secondary | ICD-10-CM | POA: Diagnosis present

## 2011-09-20 DIAGNOSIS — Z8673 Personal history of transient ischemic attack (TIA), and cerebral infarction without residual deficits: Secondary | ICD-10-CM

## 2011-09-20 DIAGNOSIS — E785 Hyperlipidemia, unspecified: Secondary | ICD-10-CM | POA: Diagnosis present

## 2011-09-20 DIAGNOSIS — I10 Essential (primary) hypertension: Secondary | ICD-10-CM | POA: Diagnosis present

## 2011-09-20 DIAGNOSIS — Z96659 Presence of unspecified artificial knee joint: Secondary | ICD-10-CM

## 2011-09-20 DIAGNOSIS — Z7982 Long term (current) use of aspirin: Secondary | ICD-10-CM

## 2011-09-20 DIAGNOSIS — I2510 Atherosclerotic heart disease of native coronary artery without angina pectoris: Secondary | ICD-10-CM | POA: Diagnosis present

## 2011-09-20 DIAGNOSIS — G459 Transient cerebral ischemic attack, unspecified: Secondary | ICD-10-CM

## 2011-09-20 DIAGNOSIS — I6350 Cerebral infarction due to unspecified occlusion or stenosis of unspecified cerebral artery: Principal | ICD-10-CM | POA: Diagnosis present

## 2011-09-20 DIAGNOSIS — E0590 Thyrotoxicosis, unspecified without thyrotoxic crisis or storm: Secondary | ICD-10-CM | POA: Diagnosis present

## 2011-09-20 DIAGNOSIS — Z79899 Other long term (current) drug therapy: Secondary | ICD-10-CM

## 2011-09-20 DIAGNOSIS — M1990 Unspecified osteoarthritis, unspecified site: Secondary | ICD-10-CM | POA: Diagnosis present

## 2011-09-20 DIAGNOSIS — Z950 Presence of cardiac pacemaker: Secondary | ICD-10-CM

## 2011-09-20 DIAGNOSIS — R29810 Facial weakness: Secondary | ICD-10-CM | POA: Diagnosis present

## 2011-09-20 DIAGNOSIS — F341 Dysthymic disorder: Secondary | ICD-10-CM | POA: Diagnosis present

## 2011-09-20 DIAGNOSIS — I2589 Other forms of chronic ischemic heart disease: Secondary | ICD-10-CM | POA: Diagnosis present

## 2011-09-20 DIAGNOSIS — I5032 Chronic diastolic (congestive) heart failure: Secondary | ICD-10-CM | POA: Diagnosis present

## 2011-09-20 LAB — CBC
HCT: 43.6 % (ref 36.0–46.0)
MCV: 90.5 fL (ref 78.0–100.0)
Platelets: 234 10*3/uL (ref 150–400)
RBC: 4.82 MIL/uL (ref 3.87–5.11)
WBC: 14.1 10*3/uL — ABNORMAL HIGH (ref 4.0–10.5)

## 2011-09-20 LAB — DIFFERENTIAL
Basophils Absolute: 0 10*3/uL (ref 0.0–0.1)
Eosinophils Relative: 1 % (ref 0–5)
Lymphocytes Relative: 19 % (ref 12–46)
Lymphs Abs: 2.7 10*3/uL (ref 0.7–4.0)
Neutro Abs: 10 10*3/uL — ABNORMAL HIGH (ref 1.7–7.7)

## 2011-09-20 LAB — POCT I-STAT, CHEM 8
BUN: 28 mg/dL — ABNORMAL HIGH (ref 6–23)
Calcium, Ion: 1.13 mmol/L (ref 1.12–1.32)
Chloride: 102 mEq/L (ref 96–112)
Creatinine, Ser: 1.1 mg/dL (ref 0.50–1.10)
Glucose, Bld: 155 mg/dL — ABNORMAL HIGH (ref 70–99)
HCT: 46 % (ref 36.0–46.0)
Potassium: 4 mEq/L (ref 3.5–5.1)

## 2011-09-20 LAB — GLUCOSE, CAPILLARY

## 2011-09-20 NOTE — ED Notes (Signed)
Pt's CBG=117  

## 2011-09-20 NOTE — Code Documentation (Signed)
78yo female arrived at 2308 after last being seen normal at 2200 by her husband. Patient had been sitting on the couch and then developed left side weakness and facial droop per patient's family. Code stroke called 2251, patient arrived at 2308, EDP exam at 2309, Stroke Team arrived at 2300, patient arrived at CT at 2312, Phlebotomy present at 2310, CT head read by Dr. Thad Ranger at [place], NIH 01, VSS, Code stroke cancelled at 2335 by Dr. Thad Ranger.

## 2011-09-20 NOTE — ED Notes (Signed)
Pt speaks [place].  pts son states that around 10pm, pt got pale and was unable to smile without a (L) droop.  (L) arm was weak so EMS was called.  Upon EMS arrival, pt was noted to have a (L) facial droop. Upon arrival to the hospital pt was noted to have a (L) facial droop with smiling only.  NIH 1.  Pt following commands.  A/O x 4.  No distress noted.

## 2011-09-21 ENCOUNTER — Inpatient Hospital Stay (HOSPITAL_COMMUNITY): Payer: Medicare Other

## 2011-09-21 ENCOUNTER — Encounter (HOSPITAL_COMMUNITY): Payer: Self-pay | Admitting: *Deleted

## 2011-09-21 LAB — CBC
HCT: 40 % (ref 36.0–46.0)
Hemoglobin: 13.4 g/dL (ref 12.0–15.0)
MCH: 30.2 pg (ref 26.0–34.0)
MCHC: 33.5 g/dL (ref 30.0–36.0)
MCV: 90.3 fL (ref 78.0–100.0)
Platelets: 235 K/uL (ref 150–400)
RBC: 4.43 MIL/uL (ref 3.87–5.11)
RDW: 13.9 % (ref 11.5–15.5)
WBC: 10.9 K/uL — ABNORMAL HIGH (ref 4.0–10.5)

## 2011-09-21 LAB — COMPREHENSIVE METABOLIC PANEL
ALT: 15 U/L (ref 0–35)
AST: 19 U/L (ref 0–37)
Alkaline Phosphatase: 58 U/L (ref 39–117)
BUN: 23 mg/dL (ref 6–23)
CO2: 32 mEq/L (ref 19–32)
CO2: 29 mEq/L (ref 19–32)
Calcium: 9.7 mg/dL (ref 8.4–10.5)
Chloride: 97 mEq/L (ref 96–112)
Chloride: 102 mEq/L (ref 96–112)
Creatinine, Ser: 0.98 mg/dL (ref 0.50–1.10)
GFR calc Af Amer: 59 mL/min — ABNORMAL LOW (ref >90)
GFR calc non Af Amer: 50 mL/min — ABNORMAL LOW (ref >90)
GFR calc non Af Amer: 54 mL/min — ABNORMAL LOW (ref >90)
Glucose, Bld: 149 mg/dL — ABNORMAL HIGH (ref 70–99)
Sodium: 139 mEq/L (ref 135–145)
Total Bilirubin: 0.3 mg/dL (ref 0.3–1.2)
Total Bilirubin: 0.3 mg/dL (ref 0.3–1.2)

## 2011-09-21 LAB — PROTIME-INR: Prothrombin Time: 14.9 seconds (ref 11.6–15.2)

## 2011-09-21 LAB — MAGNESIUM: Magnesium: 2.3 mg/dL (ref 1.5–2.5)

## 2011-09-21 LAB — APTT: aPTT: 20 seconds — ABNORMAL LOW (ref 24–37)

## 2011-09-21 LAB — HEMOGLOBIN A1C
Hgb A1c MFr Bld: 6.1 % — ABNORMAL HIGH (ref <5.7)
Mean Plasma Glucose: 128 mg/dL — ABNORMAL HIGH (ref <117)

## 2011-09-21 MED ORDER — ENOXAPARIN SODIUM 40 MG/0.4ML ~~LOC~~ SOLN
40.0000 mg | SUBCUTANEOUS | Status: DC
  Administered 2011-09-21 – 2011-09-22 (×2): 40 mg via SUBCUTANEOUS
  Filled 2011-09-21 (×2): qty 0.4

## 2011-09-21 MED ORDER — LISINOPRIL 10 MG PO TABS
10.0000 mg | ORAL_TABLET | Freq: Every day | ORAL | Status: DC
  Administered 2011-09-21 – 2011-09-22 (×2): 10 mg via ORAL
  Filled 2011-09-21 (×2): qty 1

## 2011-09-21 MED ORDER — SODIUM CHLORIDE 0.9 % IV SOLN
INTRAVENOUS | Status: DC
  Administered 2011-09-21 (×2): via INTRAVENOUS

## 2011-09-21 MED ORDER — ASPIRIN 81 MG PO TABS: 81.0000 mg | ORAL_TABLET | Freq: Every day | ORAL | Status: DC

## 2011-09-21 MED ORDER — SIMVASTATIN 40 MG PO TABS
40.0000 mg | ORAL_TABLET | Freq: Every day | ORAL | Status: DC
  Administered 2011-09-21 – 2011-09-22 (×2): 40 mg via ORAL
  Filled 2011-09-21 (×3): qty 1

## 2011-09-21 MED ORDER — METHIMAZOLE 5 MG PO TABS
5.0000 mg | ORAL_TABLET | ORAL | Status: DC
  Administered 2011-09-22: 5 mg via ORAL
  Filled 2011-09-21: qty 1

## 2011-09-21 MED ORDER — NITROGLYCERIN 0.4 MG SL SUBL: 0.4000 mg | SUBLINGUAL_TABLET | SUBLINGUAL | Status: DC | PRN

## 2011-09-21 MED ORDER — CLOPIDOGREL BISULFATE 75 MG PO TABS
75.0000 mg | ORAL_TABLET | Freq: Every day | ORAL | Status: DC
  Administered 2011-09-21 – 2011-09-22 (×2): 75 mg via ORAL
  Filled 2011-09-21 (×3): qty 1

## 2011-09-21 MED ORDER — ASPIRIN 81 MG PO CHEW
81.0000 mg | CHEWABLE_TABLET | Freq: Every day | ORAL | Status: DC
  Administered 2011-09-21 – 2011-09-22 (×2): 81 mg via ORAL
  Filled 2011-09-21 (×3): qty 1

## 2011-09-21 MED ORDER — SENNOSIDES-DOCUSATE SODIUM 8.6-50 MG PO TABS: 1.0000 | ORAL_TABLET | Freq: Every evening | ORAL | Status: DC | PRN

## 2011-09-21 NOTE — ED Provider Notes (Signed)
History     CSN: [id number]  Arrival date & time 09/20/11  2308   First MD Initiated Contact with Patient 09/20/11 2334      Chief Complaint  Patient presents with  . Code Stroke    (Consider location/radiation/quality/duration/timing/severity/associated sxs/prior treatment) HPI 78 year old female presents to emergency department as code stroke. Per family and EMS patient had onset of left-sided weakness, facial droop and confusion around 10 PM tonight. Has been reported the son that patient looked at she fail and unwell. Patient has history of prior TIAs, and also has strong cardiac history. Upon EMS arrival left-sided weakness has improved the patient still with left-sided facial droop. Patient had CT scan upon arrival and was evaluated by neurology in the CT scan suite. Patient now with only slightly drooping left corner of mouth. NIH stroke scale 1.  Patient speaks [place] and some [place]. She has no complaints at this time Past Medical History  Diagnosis Date  . Lumbar back pain   . Other urinary incontinence   . Memory loss   . DJD (degenerative joint disease)   . Depression   . Hypertension   . CVA (cerebral infarction)   . CHF (congestive heart failure)     Chronic Diastolic  . CAD (coronary artery disease)   . Presence of permanent cardiac pacemaker   . Osteoporosis   . Hypercholesterolemia   . Hyperthyroidism   . Anxiety   . Vertigo   . Tachy-brady syndrome   . Ischemic cardiomyopathy     Past Surgical History  Procedure Date  . Pacemaker insertion 2011  . Total knee arthroplasty 2002    bilateral  . Abdominal hysterectomy 1986  . Bilateral stenting 1999    to the RCA  . Cardiac catheterization 05/23/10  . Total abdominal hysterectomy w/ bilateral salpingoophorectomy     Family History  Problem Relation Age of Onset  . Thyroid disease Daughter   . Diabetes Other   . Hypertension Other   . Arthritis Other   . Coronary artery disease Mother 28  .  Coronary artery disease Father 60    History  Substance Use Topics  . Smoking status: Never Smoker   . Smokeless tobacco: Not on file  . Alcohol Use: Not on file    OB History    Grav Para Term Preterm Abortions TAB SAB Ect Mult Living                  Review of Systems  Unable to perform ROS: Other  Language barrier  Allergies  Iodine; Iohexol; and Peanut-containing drug products  Home Medications   Current Outpatient Rx  Name Route Sig Dispense Refill  . ALENDRONATE SODIUM 70 MG PO TABS Oral Take 70 mg by mouth every 7 (seven) days. Take in the morning with a full glass of water, on an empty stomach, and do not take anything else by mouth or lie down for the next 30 min. Takes on Fridays    . VITAMIN C 500 MG PO TABS Oral Take 500 mg by mouth daily.      . ASPIRIN 81 MG PO TABS Oral Take 81 mg by mouth daily.      Marland Kitchen CALCIUM CARBONATE-VIT D-MIN 600-400 MG-UNIT PO TABS Oral Take by mouth 2 (two) times daily.      Marland Kitchen CLOPIDOGREL BISULFATE 75 MG PO TABS  TAKE 1 TABLET (75 MG TOTAL) BY MOUTH DAILY. 30 tablet 6  . FUROSEMIDE 40 MG PO TABS Oral Take  1 tablet (40 mg total) by mouth daily. 30 tablet 6  . LISINOPRIL 10 MG PO TABS Oral Take 1 tablet (10 mg total) by mouth daily. 30 tablet 11  . METHIMAZOLE 5 MG PO TABS  TAKE 1 TABLET 3 TIMES A WEEK (MON, WED & FRI) 15 tablet 0  . CENTRUM SILVER PO TABS Oral Take 1 tablet by mouth daily.      Marland Kitchen SIMVASTATIN 40 MG PO TABS  TAKE 1 TABLET EVERY DAY 30 tablet 6  . SPIRONOLACTONE 25 MG PO TABS  TAKE 1 TABLET (25 MG TOTAL) BY MOUTH DAILY. 30 tablet 3  . NITROGLYCERIN 0.4 MG SL SUBL Sublingual Place 0.4 mg under the tongue every 5 (five) minutes as needed. For chest pain     . POLYETHYLENE GLYCOL 3350 PO POWD Oral Take 17 g by mouth daily as needed. constipation      BP 111/58  Pulse 72  Temp(Src) 97.6 F (36.4 C) (Oral)  Resp 14  SpO2 97%  Physical Exam  Nursing note and vitals reviewed. Constitutional: She is oriented to person,  place, and time. She appears well-developed and well-nourished.  HENT:  Head: Normocephalic and atraumatic.  Right Ear: External ear normal.  Left Ear: External ear normal.  Nose: Nose normal.  Mouth/Throat: Oropharynx is clear and moist.  Eyes: Conjunctivae and EOM are normal. Pupils are equal, round, and reactive to light.  Neck: Normal range of motion. Neck supple. No JVD present. No tracheal deviation present. No thyromegaly present.  Cardiovascular: Normal rate, normal heart sounds and intact distal pulses.  Exam reveals no gallop and no friction rub.   No murmur heard.      Occasional irregular beat  Pulmonary/Chest: Effort normal and breath sounds normal. No stridor. No respiratory distress. She has no wheezes. She has no rales. She exhibits no tenderness.  Abdominal: Soft. Bowel sounds are normal. She exhibits no distension and no mass. There is no tenderness. There is no rebound and no guarding.  Musculoskeletal: Normal range of motion. She exhibits no edema and no tenderness.  Lymphadenopathy:    She has no cervical adenopathy.  Neurological: She is oriented to person, place, and time. She has normal reflexes. A cranial nerve deficit is present. She exhibits normal muscle tone. Coordination normal.       Left sided facial droop at mouth  Skin: Skin is dry. No rash noted. No erythema. No pallor.  Psychiatric: She has a normal mood and affect. Her behavior is normal. Judgment and thought content normal.    ED Course  Procedures (including critical care time)  Labs Reviewed  APTT - Abnormal; Notable for the following:    aPTT 20 (*)    All other components within normal limits  CBC - Abnormal; Notable for the following:    WBC 14.1 (*)    All other components within normal limits  DIFFERENTIAL - Abnormal; Notable for the following:    Neutro Abs 10.0 (*)    Monocytes Absolute 1.2 (*)    All other components within normal limits  COMPREHENSIVE METABOLIC PANEL - Abnormal;  Notable for the following:    Glucose, Bld 149 (*)    BUN 24 (*)    GFR calc non Af Amer 50 (*)    GFR calc Af Amer 59 (*)    All other components within normal limits  POCT I-STAT, CHEM 8 - Abnormal; Notable for the following:    BUN 28 (*)    Glucose, Bld 155 (*)  Hemoglobin 15.6 (*)    All other components within normal limits  GLUCOSE, CAPILLARY - Abnormal; Notable for the following:    Glucose-Capillary 117 (*)    All other components within normal limits  PROTIME-INR  CK TOTAL AND CKMB  TROPONIN I   Ct Head Wo Contrast  09/20/2011  *RADIOLOGY REPORT*  Clinical Data: Left sided facial droop.  Left arm weakness.  CT HEAD WITHOUT CONTRAST  Technique:  Contiguous axial images were obtained from the base of the skull through the vertex without contrast.  Comparison: Head CT 10/12/2009.  Findings: There is a stable age related cerebral atrophy, ventriculomegaly and periventricular white matter disease.  Remote right basal ganglia infarct is again demonstrated.  No extra-axial fluid collections are identified.  No CT findings for acute hemispheric infarction and/or intracranial hemorrhage.  The brainstem and cerebellum are grossly normal and stable.  Small lacunar type infarcts are noted in the left cerebellar hemisphere.  No acute bony findings.  The paranasal sinuses and mastoid air cells are clear.  Vascular calcifications are noted.  Globes are intact.  IMPRESSION:  1.  Stable age related cerebral atrophy, ventriculomegaly and periventricular white matter disease. 2.  Remote lacunar type infarcts. 3.  No CT findings for acute hemispheric infarction or intracranial hemorrhage.  Original Report Authenticated By: P. Loralie Champagne, M.D.    Date: 09/21/2011  Rate: 65  Rhythm: paced  QRS Axis: left  Intervals: normal  ST/T Wave abnormalities: nonspecific T wave changes  Conduction Disutrbances:none  Narrative Interpretation:   Old EKG Reviewed: unchanged   No diagnosis  found.    MDM   78 yo female with resolving left sided facial weakness/arm/leg weakness.  Pt has been seen by Dr Thad Ranger with neurology, followed by Dr Pearlean Brownie.  Suspect TIA.  No TPA given rapidly improving symptoms.  To be admitted.       Olivia Mackie, MD 09/21/11 0030

## 2011-09-21 NOTE — ED Notes (Signed)
pts smile improving.  No distress noted.

## 2011-09-21 NOTE — Consult Note (Signed)
Referring Physician: Norlene Campbell    Chief Complaint: Left sided weakness and left facial droop  HPI: [name] is an 78 y.o. female with a history of stroke and TIA's who was getting up from the couch this evening and became pale acutely.  Was then noted to have a left facial droop.  When the son tried to get her to lift her arms she did not move the left as well. Has a history of cerebrovascular disease  with the left being involved on most occasions.  Last major stroke was in 2009.  Patient on Plavix and ASA at home. Initial NIHSS of 1.    LSN: [id number] tPA Given: No: Minimal symptoms mRankin:  Past Medical History  Diagnosis Date  . Lumbar back pain   . Other urinary incontinence   . Memory loss   . DJD (degenerative joint disease)   . Depression   . Hypertension   . CVA (cerebral infarction)   . CHF (congestive heart failure)     Chronic Diastolic  . CAD (coronary artery disease)   . Presence of permanent cardiac pacemaker   . Osteoporosis   . Hypercholesterolemia   . Hyperthyroidism   . Anxiety   . Vertigo   . Tachy-brady syndrome   . Ischemic cardiomyopathy     Past Surgical History  Procedure Date  . Pacemaker insertion 2011  . Total knee arthroplasty 2002    bilateral  . Abdominal hysterectomy 1986  . Bilateral stenting 1999    to the RCA  . Cardiac catheterization 05/23/10  . Total abdominal hysterectomy w/ bilateral salpingoophorectomy     Family History  Problem Relation Age of Onset  . Thyroid disease Daughter   . Diabetes Other   . Hypertension Other   . Arthritis Other   . Coronary artery disease Mother 91  . Coronary artery disease Father 63   Social History:  reports that she has never smoked. She does not have any smokeless tobacco history on file. Her alcohol and drug histories not on file.  Allergies:  Allergies  Allergen Reactions  . Iodine Other (See Comments)    unknown  . Iohexol      Code: HIVES, Desc: hives with nonionic cotrast 7 yrs  ago ([place] iv benedryl given   . Peanut-Containing Drug Products     Medications: I have reviewed the patient's current medications. Prior to Admission:  Fosamax, Vitamin C, ASA, Calcium carbonate, Lasix, Zestril, Tapazole, Centrum Silver, Nitrostat, Glycolax, Zocor, Aldactone.  ROS: Unable to obtain-patient speaks mostly [place].  Son provided interpretation.   Physical Examination: Blood pressure 111/58, pulse 72, temperature 97.6 F (36.4 C), temperature source Oral, resp. rate 14, SpO2 97.00%.  Neurologic Examination: Mental Status: Alert, oriented, thought content appropriate.  Speech fluent without evidence of aphasia.  Able to follow 3 step commands without difficulty. Cranial Nerves: II: visual fields grossly normal, pupils equal, round, reactive to light and accommodation III,IV, VI: ptosis not present, extra-ocular motions intact bilaterally V,VII: left facial droop, facial light touch sensation normal bilaterally VIII: hearing normal bilaterally IX,X: gag reflex present XI: trapezius strength/neck flexion strength normal bilaterally XII: tongue strength normal  Motor: Right : Upper extremity   5/5    Left:     Upper extremity   5/5  Lower extremity   5/5     Lower extremity   5/5 Tone and bulk:normal tone throughout; no atrophy noted Sensory: Pinprick and light touch intact throughout, bilaterally Deep Tendon Reflexes: 2+ and symmetric with  absent AJ's bilaterally Plantars: Right: downgoing   Left: downgoing Cerebellar: normal finger-to-nose and normal heel-to-shin test   Results for orders placed during the hospital encounter of 09/20/11 (from the past 48 hour(s))  CBC     Status: Abnormal   Collection Time   09/20/11 11:24 PM      Component Value Range Comment   WBC 14.1 (*) 4.0 - 10.5 (K/uL)    RBC 4.82  3.87 - 5.11 (MIL/uL)    Hemoglobin 14.7  12.0 - 15.0 (g/dL)    HCT 08.6  57.8 - 46.9 (%)    MCV 90.5  78.0 - 100.0 (fL)    MCH 30.5  26.0 - 34.0 (pg)     MCHC 33.7  30.0 - 36.0 (g/dL)    RDW 62.9  52.8 - 41.3 (%)    Platelets 234  150 - 400 (K/uL)   DIFFERENTIAL     Status: Abnormal   Collection Time   09/20/11 11:24 PM      Component Value Range Comment   Neutrophils Relative 71  43 - 77 (%)    Neutro Abs 10.0 (*) 1.7 - 7.7 (K/uL)    Lymphocytes Relative 19  12 - 46 (%)    Lymphs Abs 2.7  0.7 - 4.0 (K/uL)    Monocytes Relative 9  3 - 12 (%)    Monocytes Absolute 1.2 (*) 0.1 - 1.0 (K/uL)    Eosinophils Relative 1  0 - 5 (%)    Eosinophils Absolute 0.2  0.0 - 0.7 (K/uL)    Basophils Relative 0  0 - 1 (%)    Basophils Absolute 0.0  0.0 - 0.1 (K/uL)   POCT I-STAT, CHEM 8     Status: Abnormal   Collection Time   09/20/11 11:40 PM      Component Value Range Comment   Sodium 140  135 - 145 (mEq/L)    Potassium 4.0  3.5 - 5.1 (mEq/L)    Chloride 102  96 - 112 (mEq/L)    BUN 28 (*) 6 - 23 (mg/dL)    Creatinine, Ser 2.44  0.50 - 1.10 (mg/dL)    Glucose, Bld 010 (*) 70 - 99 (mg/dL)    Calcium, Ion 2.72  1.12 - 1.32 (mmol/L)    TCO2 32  0 - 100 (mmol/L)    Hemoglobin 15.6 (*) 12.0 - 15.0 (g/dL)    HCT 53.6  64.4 - 03.4 (%)   GLUCOSE, CAPILLARY     Status: Abnormal   Collection Time   09/20/11 11:42 PM      Component Value Range Comment   Glucose-Capillary 117 (*) 70 - 99 (mg/dL)    Comment 1 Documented in Chart      Comment 2 Notify RN      Ct Head Wo Contrast  09/20/2011  *RADIOLOGY REPORT*  Clinical Data: Left sided facial droop.  Left arm weakness.  CT HEAD WITHOUT CONTRAST  Technique:  Contiguous axial images were obtained from the base of the skull through the vertex without contrast.  Comparison: Head CT 10/12/2009.  Findings: There is a stable age related cerebral atrophy, ventriculomegaly and periventricular white matter disease.  Remote right basal ganglia infarct is again demonstrated.  No extra-axial fluid collections are identified.  No CT findings for acute hemispheric infarction and/or intracranial hemorrhage.  The brainstem and  cerebellum are grossly normal and stable.  Small lacunar type infarcts are noted in the left cerebellar hemisphere.  No acute bony findings.  The paranasal  sinuses and mastoid air cells are clear.  Vascular calcifications are noted.  Globes are intact.  IMPRESSION:  1.  Stable age related cerebral atrophy, ventriculomegaly and periventricular white matter disease. 2.  Remote lacunar type infarcts. 3.  No CT findings for acute hemispheric infarction or intracranial hemorrhage.  Original Report Authenticated By: P. Loralie Champagne, M.D.    Assessment: 78 y.o. female who presented with complaints of left sided weakness and left facial droop.  Symptoms improved int he ED prior to evaluation with only the facial droop as a residual.    Patient with a history of cerebrovascular disease.  Suspect a small vessel right brain event.  Patient on ASA and Plavix.                                                                                                                                                                                                                                  Stroke Risk Factors - hyperlipidemia and hypertension  Plan: 1. HgbA1c, fasting lipid panel 2. Unable to have MRI, MRA  of the brain without contrast due to pacemaker.  Therefore repeat head CT on 2403 3. PT consult, OT consult, Speech consult 4. Echocardiogram 5. Carotid dopplers 6. Prophylactic therapy-Continue ASA and Plavix 7. Risk factor modification 8. Telemetry monitoring    Thana Farr, MD [facility] [phone] 09/21/2011, 12:00 AM

## 2011-09-21 NOTE — Evaluation (Signed)
Physical Therapy Evaluation Patient Details Name: [name] MRN: [id number] DOB: [birth year] Today's Date: 09/21/2011  Problem List:  Patient Active Problem List  Diagnoses  . HYPERTHYROIDISM  . HYPERCHOLESTEROLEMIA  . DEPRESSION  . HYPERTENSION  . CAD  . CHF  . CVA  . BACK PAIN, LUMBAR  . OSTEOPOROSIS  . MEMORY LOSS  . OTHER URINARY INCONTINENCE  . PACEMAKER, PERMANENT    Past Medical History:  Past Medical History  Diagnosis Date  . Lumbar back pain   . Other urinary incontinence   . Memory loss   . DJD (degenerative joint disease)   . Depression   . Hypertension   . CVA (cerebral infarction)   . CHF (congestive heart failure)     Chronic Diastolic  . CAD (coronary artery disease)   . Presence of permanent cardiac pacemaker   . Osteoporosis   . Hypercholesterolemia   . Hyperthyroidism   . Anxiety   . Vertigo   . Tachy-brady syndrome   . Ischemic cardiomyopathy    Past Surgical History:  Past Surgical History  Procedure Date  . Pacemaker insertion 2011  . Total knee arthroplasty 2002    bilateral  . Abdominal hysterectomy 1986  . Bilateral stenting 1999    to the RCA  . Cardiac catheterization 05/23/10  . Total abdominal hysterectomy w/ bilateral salpingoophorectomy     PT Assessment/Plan/Recommendation PT Assessment Clinical Impression Statement: pt presents with R/O CVA vs Orhtostatic.  pt's BP decreased from sit to stand and pt with C/O dizziness.   PT Recommendation/Assessment: Patient will need skilled PT in the acute care venue PT Problem List: Decreased activity tolerance;Decreased balance;Decreased mobility;Decreased knowledge of use of DME;Decreased safety awareness;Decreased cognition Barriers to Discharge: None PT Therapy Diagnosis : Difficulty walking PT Plan PT Frequency: Min 3X/week PT Treatment/Interventions: DME instruction;Gait training;Stair training;Functional mobility training;Therapeutic activities;Therapeutic exercise;Balance  training;Neuromuscular re-education;Patient/family education PT Recommendation Follow Up Recommendations: Home health PT;Supervision/Assistance - 24 hour Equipment Recommended: Rolling walker with 5" wheels PT Goals  Acute Rehab PT Goals PT Goal Formulation: With patient Time For Goal Achievement: 2 weeks Pt will go Supine/Side to Sit: Independently PT Goal: Supine/Side to Sit - Progress: Goal set today Pt will go Sit to Supine/Side: Independently PT Goal: Sit to Supine/Side - Progress: Goal set today Pt will go Sit to Stand: with modified independence PT Goal: Sit to Stand - Progress: Goal set today Pt will go Stand to Sit: with modified independence PT Goal: Stand to Sit - Progress: Goal set today Pt will Ambulate: >150 feet;with supervision;with rolling walker PT Goal: Ambulate - Progress: Goal set today Pt will Go Up / Down Stairs: 3-5 stairs;with min assist PT Goal: Up/Down Stairs - Progress: Goal set today  PT Evaluation Precautions/Restrictions  Precautions Precautions: Fall Restrictions Weight Bearing Restrictions: No Prior Functioning  Home Living Lives With: Spouse;Son;Family Receives Help From: Family Type of Home: House Additional Comments: pt with history of Dementia and goes between speaking [place] and [place].  Asked pt to speak in English as this PT does not speak [place] she would smile and keep talking in both languages.   Prior Function Level of Independence: Independent with basic ADLs;Independent with gait;Independent with transfers;Needs assistance with homemaking Able to Take Stairs?: Yes (With A.  ) Driving: No Vocation: Unemployed Cognition Cognition Arousal/Alertness: Awake/alert Orientation Level: Oriented to person;Oriented to place;Disoriented to situation;Disoriented to time Sensation/Coordination   Extremity Assessment RLE Assessment RLE Assessment: Within Functional Limits LLE Assessment LLE Assessment: Within Functional Limits Mobility  (including  Balance) Bed Mobility Bed Mobility: Yes Supine to Sit: 5: Supervision;With rails Supine to Sit Details (indicate cue type and reason): pt moves slowly and uses rail.   Sitting - Scoot to Edge of Bed: 5: Supervision Sit to Supine: 5: Supervision Transfers Transfers: Yes Sit to Stand: 4: Min assist;With upper extremity assist;From bed Sit to Stand Details (indicate cue type and reason): pt mildly unsteady.   Stand to Sit: 4: Min assist;With upper extremity assist;To bed Stand to Sit Details: cues to use UEs to control descent.   Ambulation/Gait Ambulation/Gait: Yes Ambulation/Gait Assistance: 4: Min assist Ambulation/Gait Assistance Details (indicate cue type and reason): pt moves slowly and c/o dizziness.   Ambulation Distance (Feet): 30 Feet Assistive device: Rolling walker Gait Pattern: Step-through pattern;Decreased stride length Stairs: No Wheelchair Mobility Wheelchair Mobility: No  Posture/Postural Control Posture/Postural Control: No significant limitations Balance Balance Assessed: No Exercise    End of Session PT - End of Session Equipment Utilized During Treatment: Gait belt Activity Tolerance: Patient tolerated treatment well Patient left: in bed;with call bell in reach Nurse Communication: Mobility status for transfers;Mobility status for ambulation General Behavior During Session: [facility] for tasks performed Cognition: Impaired, at baseline  Sunny Schlein, [place] [phone] 09/21/2011, 1:03 PM

## 2011-09-21 NOTE — Progress Notes (Signed)
Utilization Review Completed.Priest Lockridge T4/09/2011   

## 2011-09-21 NOTE — Progress Notes (Signed)
Arrived to 3707 with family at side. Appears to be in NAD. Ambulated from stretcher to bed with 2 assist. Oriented to room and surroundings. Made comfortable in bed. Call bell in reach. Pt's primary language is [place]; son Ed answered most of the admission questions. Admission assessment complete, see doc flowsheets.

## 2011-09-21 NOTE — Progress Notes (Signed)
I have seen and examined patient admitted with left facial droop and left upper extremity weakness-left upper extremity weakness now resolved. Initially a CT angiogram of head was ordered her for neuro the patient reports history of contrast allergy and so the study was changed to a CT without contrast per neuro. She denies any complaints at this time, carotid Dopplers with no significant ICA stenosis, awaiting 2-D echo. We'll continue current management and plan as per Dr.Kakrakandy, appreciate neuro assistance.  [name] [facility] [phone]

## 2011-09-21 NOTE — Evaluation (Signed)
Speech Language Pathology Evaluation Patient Details Name: [name] MRN: [id number] DOB: January 18, 1932 Today's Date: 09/21/2011  Problem List:  Patient Active Problem List  Diagnoses  . HYPERTHYROIDISM  . HYPERCHOLESTEROLEMIA  . DEPRESSION  . HYPERTENSION  . CAD  . CHF  . CVA  . BACK PAIN, LUMBAR  . OSTEOPOROSIS  . MEMORY LOSS  . OTHER URINARY INCONTINENCE  . PACEMAKER, PERMANENT   Past Medical History:  Past Medical History  Diagnosis Date  . Lumbar back pain   . Other urinary incontinence   . Memory loss   . DJD (degenerative joint disease)   . Depression   . Hypertension   . CVA (cerebral infarction)   . CHF (congestive heart failure)     Chronic Diastolic  . CAD (coronary artery disease)   . Presence of permanent cardiac pacemaker   . Osteoporosis   . Hypercholesterolemia   . Hyperthyroidism   . Anxiety   . Vertigo   . Tachy-brady syndrome   . Ischemic cardiomyopathy    SLP Assessment/Plan/Recommendation Clinical Impression Statement: 78 year-old female with known history of CAD status post stenting, tachybradycardia syndrome status post pacemaker placement, diastolic CHF, previous stroke, hypertension last evening when she tried to get up from the couch at home suddenly felt weak and subsequent to which was found to have left facial droop and left upper extremity weakness wherein she was unable to move her left upper extremity. She was brought to the ER by then her left upper extremity weakness has completely resolved but her left facial droop persisted.   *Patient presents with cognitive impairments consistent with baseline dementia.  Lengthy telephone conversation with patient's daughter-in-law revealed worsening dementia signs over the las month with repetive question asking, safety issues with mobilitly in the house requiring 24 hour assistance from family (her and husband live in an attached apartment to son's house), as well as alternation of English/[place]  langauge despite the known language of her listener.  No skilled SLP services at this time.  Completed education with family via telephone.  SLP Recommendation/Assessment: Patient does not need any further Speech [facility]  Myra Rude, M.S.,CCC-SLP Pager [phone] 09/21/2011, 10:39 AM

## 2011-09-21 NOTE — Plan of Care (Signed)
Problem: Phase I Progression Outcomes Goal: Pt admitted to Stroke Unit Outcome: Completed/Met Date Met:  09/21/11 Admitted to 3700 Goal: Strict NPO til swallow screen done Outcome: Completed/Met Date Met:  09/21/11 Passed swallow eval in ED Goal: NIH Stroke Scale-NIHSS done on admission Outcome: Completed/Met Date Met:  09/21/11 NIHSS=1 on admission to 3700. Pt with mild facial drooping. Goal: Pain controlled with appropriate interventions Outcome: Completed/Met Date Met:  09/21/11 No complaints of pain on admision.    Goal: BP within ordered parameters Outcome: Completed/Met Date Met:  09/21/11 See VS in doc flow

## 2011-09-21 NOTE — Progress Notes (Signed)
Stroke Team Progress Note  HISTORY [name] is an 78 y.o. female with a history of stroke and TIA's who was getting up from the couch the evening of 09/20/2011  and became pale acutely. Was then noted to have a left facial droop. When the son tried to get her to lift her arms she did not move the left as well. Has a history of cerebrovascular disease with the left being involved on most occasions. Last major stroke was in 2009. Patient on Plavix and ASA at home. Initial NIHSS of 1. Patient was not a TPA candidate secondary to mild symptoms. she was admitted for further evaluation and treatment.  SUBJECTIVE No familly is at the bedside. Patient is working with PT. She has a history of atrial flutter, not on coumadin.  OBJECTIVE Filed Vitals:   09/21/11 0115 09/21/11 0140 09/21/11 0500 09/21/11 0746  BP: 123/78 123/84 105/68 111/65  Pulse: 61 66 68 64  Temp:  98.4 F (36.9 C) 97.7 F (36.5 C) 97.8 F (36.6 C)  TempSrc:  Oral Oral Oral  Resp: 15 18 18 18   Height:  4' 11.5" (1.511 m)    Weight:  77.792 kg (171 lb 8 oz)    SpO2: 95% 96% 95% 98%   CBG (last 3)  Basename 09/20/11 2342  GLUCAP 117*   Intake/Output from previous day: 04/03 0701 - 04/04 0700 In: 100 [I.V.:100] Out: -   IV Fluid Intake:     . sodium chloride 50 mL/hr at 09/21/11 0334   Medications    . aspirin  81 mg Oral Daily  . clopidogrel  75 mg Oral Q breakfast  . enoxaparin  40 mg Subcutaneous Q24H  . lisinopril  10 mg Oral Daily  . methimazole  5 mg Oral Q M,W,F  . simvastatin  40 mg Oral q1800  . DISCONTD: aspirin  81 mg Oral Daily  PRN nitroGLYCERIN, senna-docusate  Diet:  Cardiac thin liquids Activity:  Bathroom privileges DVT Prophylaxis:  Lovenox 40 mg sq daily   Significant Diagnostic Studies: CBC    Component Value Date/Time   WBC 10.9* 09/21/2011 0532   RBC 4.43 09/21/2011 0532   HGB 13.4 09/21/2011 0532   HCT 40.0 09/21/2011 0532   PLT 235 09/21/2011 0532   MCV 90.3 09/21/2011 0532   MCH 30.2  09/21/2011 0532   MCHC 33.5 09/21/2011 0532   RDW 13.9 09/21/2011 0532   LYMPHSABS 2.7 09/20/2011 2324   MONOABS 1.2* 09/20/2011 2324   EOSABS 0.2 09/20/2011 2324   BASOSABS 0.0 09/20/2011 2324   CMP    Component Value Date/Time   NA 140 09/21/2011 0532   K 4.1 09/21/2011 0532   CL 102 09/21/2011 0532   CO2 29 09/21/2011 0532   GLUCOSE 109* 09/21/2011 0532   BUN 23 09/21/2011 0532   CREATININE 0.98 09/21/2011 0532   CALCIUM 9.2 09/21/2011 0532   PROT 6.5 09/21/2011 0532   ALBUMIN 3.5 09/21/2011 0532   AST 15 09/21/2011 0532   ALT 12 09/21/2011 0532   ALKPHOS 50 09/21/2011 0532   BILITOT 0.3 09/21/2011 0532   GFRNONAA 54* 09/21/2011 0532   GFRAA 63* 09/21/2011 0532   COAGS Lab Results  Component Value Date   INR 1.14 09/20/2011   INR 1.24 08/20/2010   INR 7.13 CRITICAL RESULT CALLED TO, READ BACK BY AND VERIFIED WITH: S HUGHES,RN,1503,05/23/10,D BRADLEY* 05/23/2010   Lipid Panel    Component Value Date/Time   CHOL 142 09/21/2011 0532   TRIG 104 09/21/2011  0532   HDL 61 09/21/2011 0532   CHOLHDL 2.3 09/21/2011 0532   VLDL 21 09/21/2011 0532   LDLCALC 60 09/21/2011 0532   HgbA1C  Lab Results  Component Value Date   HGBA1C  Value: 6.0 (NOTE)                                                                       According to the ADA Clinical Practice Recommendations for 2011, when HbA1c is used as a screening test:   >=6.5%   Diagnostic of Diabetes Mellitus           (if abnormal result  is confirmed)  5.7-6.4%   Increased risk of developing Diabetes Mellitus  References:Diagnosis and Classification of Diabetes Mellitus,Diabetes Care,2011,34(Suppl 1):S62-S69 and Standards of Medical Care in         Diabetes - 2011,Diabetes WRUE,4540,98  (Suppl 1):S11-S61.* 05/23/2010   Urine Drug Screen  No results found for this basename: labopia, cocainscrnur, labbenz, amphetmu, thcu, labbarb    Alcohol Level No results found for this basename: eth   CT of the brain  09/20/2011    1.  Stable age related cerebral atrophy, ventriculomegaly and  periventricular white matter disease. 2.  Remote lacunar type infarcts. 3.  No CT findings for acute hemispheric infarction or intracranial hemorrhage.  CT angio  Will order  MRI of the brain  pacer  MRA of the brain  pacer  2D Echocardiogram  ordered   Carotid Doppler  No internal carotid artery stenosis bilaterally. Vertebrals with antegrade flow bilaterally.   CXR  pending  EKG  Atrial paced rhythm, see report for full details.   Physical Exam   Pleasant middle-aged lady currently not in distress. Afebrile. Head is nontraumatic. Neck is supple without bruit. Hearing appears normal. Cardiac exam the murmur gallop. Pacemaker is noted in the left infraclavicular region. Distal pulses well felt. Lungs are clear to auscultation. Neurological exam Awake  Alert oriented x 3. Normal speech and language.eye movements full without nystagmus. Face is mildly asymmetric with left lower face droop. Tongue midline. Normal strength, tone, reflexes and coordination. Normal sensation. Gait deferred.   ASSESSMENT [name] is a 78 y.o. female with a probably right brain stroke, secondary to unknown etiology. Workup underway. On aspirin 81 mg orally every day and clopidogrel 75 mg orally every day prior admission. Now on aspirin 81 mg orally every day and clopidogrel 75 mg orally every day for secondary stroke prevention. Patient with resultant left facial droop   -hx aflutter, not a coumadin candidate at this time secondary to fall risk -pacemaker -baseline memory deficit -hypertension -CVA -CAD -Hypercholesterolemia -ischemic cardiomyopathy  Hospital day # 1  TREATMENT/PLAN - Continue aspirin 81 mg orally every day and clopidogrel 75 mg orally every day for secondary stroke prevention. - CTA head and neck scheduled. Later found pt allergic to contrast with rash/hives. Family would like [place] to avoid CTA if possible. CTA canceled and CT without contrast ordered.  Joaquin Music,  ANP-BC, GNP-BC [facility] Pager: [phone] 09/21/2011 8:58 AM  Dr. Delia Heady, Stroke Center Medical Director, has personally reviewed chart, pertinent data, examined the patient and developed the plan of care.

## 2011-09-21 NOTE — H&P (Signed)
[name] is an 78 y.o. female.   PCP - [facility]. Cardiologist - Dr.Paula Tenny Craw. Chief Complaint: Left facial droop and left upper extremity weakness. HPI: 79 year-old female with known history of CAD status post stenting, tachybradycardia syndrome status post pacemaker placement, diastolic CHF, previous stroke, hypertension last evening when she tried to get up from the couch at home suddenly felt weak and subsequent to which was found to have left facial droop and left upper extremity weakness wherein she was unable to move her left upper extremity. She was brought to the ER by then her left upper extremity weakness has completely resolved but her left facial droop persisted. CT of the head was negative for anything acute. Neurologist on call was consulted and at this time hospitalist has been requested admission. Patient denies any chest pain, shortness of breath, nausea vomiting, abdominal pain or diarrhea.  Past Medical History  Diagnosis Date  . Lumbar back pain   . Other urinary incontinence   . Memory loss   . DJD (degenerative joint disease)   . Depression   . Hypertension   . CVA (cerebral infarction)   . CHF (congestive heart failure)     Chronic Diastolic  . CAD (coronary artery disease)   . Presence of permanent cardiac pacemaker   . Osteoporosis   . Hypercholesterolemia   . Hyperthyroidism   . Anxiety   . Vertigo   . Tachy-brady syndrome   . Ischemic cardiomyopathy     Past Surgical History  Procedure Date  . Pacemaker insertion 2011  . Total knee arthroplasty 2002    bilateral  . Abdominal hysterectomy 1986  . Bilateral stenting 1999    to the RCA  . Cardiac catheterization 05/23/10  . Total abdominal hysterectomy w/ bilateral salpingoophorectomy     Family History  Problem Relation Age of Onset  . Thyroid disease Daughter   . Diabetes Other   . Hypertension Other   . Arthritis Other   . Coronary artery disease Mother 8  . Coronary artery disease Father  80   Social History:  reports that she has never smoked. She does not have any smokeless tobacco history on file. She reports that she does not drink alcohol or use illicit drugs.  Allergies:  Allergies  Allergen Reactions  . Iodine Other (See Comments)    unknown  . Iohexol      Code: HIVES, Desc: hives with nonionic cotrast 7 yrs ago ([place] iv benedryl given   . Peanut-Containing Drug Products     Medications Prior to Admission  Medication Dose Route Frequency Provider Last Rate Last Dose  . 0.9 %  sodium chloride infusion   Intravenous Continuous Eduard Clos, MD      . aspirin chewable tablet 81 mg  81 mg Oral Daily Eduard Clos, MD      . clopidogrel (PLAVIX) tablet 75 mg  75 mg Oral Q breakfast Eduard Clos, MD      . enoxaparin (LOVENOX) injection 40 mg  40 mg Subcutaneous Q24H Eduard Clos, MD      . lisinopril (PRINIVIL,ZESTRIL) tablet 10 mg  10 mg Oral Daily Eduard Clos, MD      . methimazole (TAPAZOLE) tablet 5 mg  5 mg Oral Q M,W,F Eduard Clos, MD      . nitroGLYCERIN (NITROSTAT) SL tablet 0.4 mg  0.4 mg Sublingual Q5 min PRN Eduard Clos, MD      . senna-docusate (Senokot-S) tablet 1  tablet  1 tablet Oral QHS PRN Eduard Clos, MD      . simvastatin (ZOCOR) tablet 40 mg  40 mg Oral q1800 Eduard Clos, MD      . DISCONTD: aspirin tablet 81 mg  81 mg Oral Daily Eduard Clos, MD       Medications Prior to Admission  Medication Sig Dispense Refill  . alendronate (FOSAMAX) 70 MG tablet Take 70 mg by mouth every 7 (seven) days. Take in the morning with a full glass of water, on an empty stomach, and do not take anything else by mouth or lie down for the next 30 min. Takes on Fridays      . Ascorbic Acid (VITAMIN C) 500 MG tablet Take 500 mg by mouth daily.        Marland Kitchen aspirin 81 MG tablet Take 81 mg by mouth daily.        . Calcium Carbonate-Vit D-Min 600-400 MG-UNIT TABS Take by mouth 2 (two) times daily.         . clopidogrel (PLAVIX) 75 MG tablet TAKE 1 TABLET (75 MG TOTAL) BY MOUTH DAILY.  30 tablet  6  . furosemide (LASIX) 40 MG tablet Take 1 tablet (40 mg total) by mouth daily.  30 tablet  6  . lisinopril (PRINIVIL,ZESTRIL) 10 MG tablet Take 1 tablet (10 mg total) by mouth daily.  30 tablet  11  . methimazole (TAPAZOLE) 5 MG tablet TAKE 1 TABLET 3 TIMES A WEEK (MON, WED & FRI)  15 tablet  0  . Multiple Vitamins-Minerals (CENTRUM SILVER) tablet Take 1 tablet by mouth daily.        . simvastatin (ZOCOR) 40 MG tablet TAKE 1 TABLET EVERY DAY  30 tablet  6  . spironolactone (ALDACTONE) 25 MG tablet TAKE 1 TABLET (25 MG TOTAL) BY MOUTH DAILY.  30 tablet  3  . nitroGLYCERIN (NITROSTAT) 0.4 MG SL tablet Place 0.4 mg under the tongue every 5 (five) minutes as needed. For chest pain       . polyethylene glycol (GLYCOLAX) powder Take 17 g by mouth daily as needed. constipation        Results for orders placed during the hospital encounter of 09/20/11 (from the past 48 hour(s))  PROTIME-INR     Status: Normal   Collection Time   09/20/11 11:24 PM      Component Value Range Comment   Prothrombin Time 14.9  11.6 - 15.2 (seconds)    INR 1.14  0.00 - 1.49    APTT     Status: Abnormal   Collection Time   09/20/11 11:24 PM      Component Value Range Comment   aPTT 20 (*) 24 - 37 (seconds)   CBC     Status: Abnormal   Collection Time   09/20/11 11:24 PM      Component Value Range Comment   WBC 14.1 (*) 4.0 - 10.5 (K/uL)    RBC 4.82  3.87 - 5.11 (MIL/uL)    Hemoglobin 14.7  12.0 - 15.0 (g/dL)    HCT 40.9  81.1 - 91.4 (%)    MCV 90.5  78.0 - 100.0 (fL)    MCH 30.5  26.0 - 34.0 (pg)    MCHC 33.7  30.0 - 36.0 (g/dL)    RDW 78.2  95.6 - 21.3 (%)    Platelets 234  150 - 400 (K/uL)   DIFFERENTIAL     Status: Abnormal   Collection Time  09/20/11 11:24 PM      Component Value Range Comment   Neutrophils Relative 71  43 - 77 (%)    Neutro Abs 10.0 (*) 1.7 - 7.7 (K/uL)    Lymphocytes Relative 19  12 - 46 (%)      Lymphs Abs 2.7  0.7 - 4.0 (K/uL)    Monocytes Relative 9  3 - 12 (%)    Monocytes Absolute 1.2 (*) 0.1 - 1.0 (K/uL)    Eosinophils Relative 1  0 - 5 (%)    Eosinophils Absolute 0.2  0.0 - 0.7 (K/uL)    Basophils Relative 0  0 - 1 (%)    Basophils Absolute 0.0  0.0 - 0.1 (K/uL)   COMPREHENSIVE METABOLIC PANEL     Status: Abnormal   Collection Time   09/20/11 11:24 PM      Component Value Range Comment   Sodium 139  135 - 145 (mEq/L)    Potassium 4.0  3.5 - 5.1 (mEq/L)    Chloride 97  96 - 112 (mEq/L)    CO2 32  19 - 32 (mEq/L)    Glucose, Bld 149 (*) 70 - 99 (mg/dL)    BUN 24 (*) 6 - 23 (mg/dL)    Creatinine, Ser 1.47  0.50 - 1.10 (mg/dL)    Calcium 9.7  8.4 - 10.5 (mg/dL)    Total Protein 7.5  6.0 - 8.3 (g/dL)    Albumin 4.2  3.5 - 5.2 (g/dL)    AST 19  0 - 37 (U/L) HEMOLYSIS AT THIS LEVEL MAY AFFECT RESULT   ALT 15  0 - 35 (U/L)    Alkaline Phosphatase 58  39 - 117 (U/L)    Total Bilirubin 0.3  0.3 - 1.2 (mg/dL)    GFR calc non Af Amer 50 (*) >90 (mL/min)    GFR calc Af Amer 59 (*) >90 (mL/min)   CK TOTAL AND CKMB     Status: Normal   Collection Time   09/20/11 11:24 PM      Component Value Range Comment   Total CK 68  7 - 177 (U/L)    CK, MB 2.6  0.3 - 4.0 (ng/mL)    Relative Index RELATIVE INDEX IS INVALID  0.0 - 2.5    TROPONIN I     Status: Normal   Collection Time   09/20/11 11:24 PM      Component Value Range Comment   Troponin I <0.30  <0.30 (ng/mL)   POCT I-STAT, CHEM 8     Status: Abnormal   Collection Time   09/20/11 11:40 PM      Component Value Range Comment   Sodium 140  135 - 145 (mEq/L)    Potassium 4.0  3.5 - 5.1 (mEq/L)    Chloride 102  96 - 112 (mEq/L)    BUN 28 (*) 6 - 23 (mg/dL)    Creatinine, Ser 8.29  0.50 - 1.10 (mg/dL)    Glucose, Bld 562 (*) 70 - 99 (mg/dL)    Calcium, Ion 1.30  1.12 - 1.32 (mmol/L)    TCO2 32  0 - 100 (mmol/L)    Hemoglobin 15.6 (*) 12.0 - 15.0 (g/dL)    HCT 86.5  78.4 - 69.6 (%)   GLUCOSE, CAPILLARY     Status: Abnormal    Collection Time   09/20/11 11:42 PM      Component Value Range Comment   Glucose-Capillary 117 (*) 70 - 99 (mg/dL)    Comment  1 Documented in Chart      Comment 2 Notify RN      Ct Head Wo Contrast  09/20/2011  *RADIOLOGY REPORT*  Clinical Data: Left sided facial droop.  Left arm weakness.  CT HEAD WITHOUT CONTRAST  Technique:  Contiguous axial images were obtained from the base of the skull through the vertex without contrast.  Comparison: Head CT 10/12/2009.  Findings: There is a stable age related cerebral atrophy, ventriculomegaly and periventricular white matter disease.  Remote right basal ganglia infarct is again demonstrated.  No extra-axial fluid collections are identified.  No CT findings for acute hemispheric infarction and/or intracranial hemorrhage.  The brainstem and cerebellum are grossly normal and stable.  Small lacunar type infarcts are noted in the left cerebellar hemisphere.  No acute bony findings.  The paranasal sinuses and mastoid air cells are clear.  Vascular calcifications are noted.  Globes are intact.  IMPRESSION:  1.  Stable age related cerebral atrophy, ventriculomegaly and periventricular white matter disease. 2.  Remote lacunar type infarcts. 3.  No CT findings for acute hemispheric infarction or intracranial hemorrhage.  Original Report Authenticated By: P. Loralie Champagne, M.D.    Review of Systems  Constitutional: Negative.   HENT: Negative.   Eyes: Negative.   Respiratory: Negative.   Cardiovascular: Negative.   Gastrointestinal: Negative.   Genitourinary: Negative.   Musculoskeletal: Negative.   Skin: Negative.   Neurological:       Left facial droop and left upper extremity weakness.  Endo/Heme/Allergies: Negative.   Psychiatric/Behavioral: Negative.     Blood pressure 123/84, pulse 66, temperature 98.4 F (36.9 C), temperature source Oral, resp. rate 18, height 4' 11.5" (1.511 m), weight 77.792 kg (171 lb 8 oz), SpO2 96.00%. Physical Exam    Constitutional: She is oriented to person, place, and time. She appears well-developed and well-nourished. No distress.  HENT:  Head: Normocephalic and atraumatic.  Right Ear: External ear normal.  Left Ear: External ear normal.  Mouth/Throat: No oropharyngeal exudate.  Eyes: Conjunctivae are normal. Pupils are equal, round, and reactive to light. Right eye exhibits no discharge. Left eye exhibits no discharge.  Neck: Normal range of motion. Neck supple.  Cardiovascular: Normal rate and regular rhythm.   Respiratory: Effort normal and breath sounds normal. No respiratory distress. She has no wheezes. She has no rales.  GI: Soft. Bowel sounds are normal. She exhibits no distension. There is no tenderness. There is no rebound.  Musculoskeletal: Normal range of motion. She exhibits no edema and no tenderness.  Neurological: She is alert and oriented to person, place, and time.       Left facial droop. Moves all extremities 5/5. No tongue deviation.  Skin: Skin is warm and dry. She is not diaphoretic.  Psychiatric: Her behavior is normal.     Assessment/Plan #1. CVA - since patient has pacemaker an MRI cannot be done. A repeat CT head was advised by the neurologist. Patient will be placed on neuro checks and swallow evaluation with 2-D echo and carotid Doppler. To continue aspirin and Plavix. #2. History of CAD status post stenting - has no chest pain. #3. History of hypothyroidism. #4. History of diastolic CHF. #5. Hyperlipidemia. #6. History of tachybradycardia syndrome status post pacemaker placement.  CODE STATUS - full code.  Diesha Rostad N. 09/21/2011, 2:54 AM

## 2011-09-21 NOTE — Progress Notes (Signed)
CT angio ordered per stroke team. Pt has allergy to iodine.  Pt speaks [place] and english and I'm unable to assess the severity of reaction when given iodine. Ive spoken to the pt's son in regards to this and he is unsure of the reaction it would cause, but states he will be here soon and will discuss with his mother.  Stroke team updated in regards to this situation. CT angio on hold until further clarification.

## 2011-09-21 NOTE — Progress Notes (Signed)
*  PRELIMINARY RESULTS* Vascular Ultrasound Carotid Duplex (Doppler) has been completed.  Preliminary findings: Bilateral: No hemodynamically significant ICA stenosis. Vertebral artery flow is antegrade.  Cathie Beams Deneen 09/21/2011, 11:11 AM

## 2011-09-22 DIAGNOSIS — I359 Nonrheumatic aortic valve disorder, unspecified: Secondary | ICD-10-CM

## 2011-09-22 NOTE — Discharge Instructions (Signed)
STROKE/TIA DISCHARGE INSTRUCTIONS SMOKING Cigarette smoking nearly doubles your risk of having a stroke & is the single most alterable risk factor  If you smoke or have smoked in the last 12 months, you are advised to quit smoking for your health.  Most of the excess cardiovascular risk related to smoking disappears within a year of stopping.  Ask you doctor about anti-smoking medications  [place] Quit Line: 1-800-QUIT NOW  Free Smoking Cessation Classes ([phone]  CHOLESTEROL Know your levels; limit fat & cholesterol in your diet  Lipid Panel   Chol: 142 Triglycerides: 104 HDL: 61 LDL: 60     Many patients benefit from treatment even if their cholesterol is at goal.  Goal: Total Cholesterol (CHOL) less than 160  Goal:  Triglycerides (TRIG) less than 150  Goal:  HDL greater than 40  Goal:  LDL (LDLCALC) less than 100   BLOOD PRESSURE American Stroke Association blood pressure target is less that 120/80 mm/Hg   BP: 105/68 mmHg  Monitor your blood pressure  Limit your salt and alcohol intake  Many individuals will require more than one medication for high blood pressure  DIABETES (A1c is a blood sugar average for last 3 months) Goal HGBA1c is under 7% (HBGA1c is blood sugar average for last 3 months)  Diabetes: Not diabetic, A1C not checked     Your HGBA1c can be lowered with medications, healthy diet, and exercise.  Check your blood sugar as directed by your physician  Call your physician if you experience unexplained or low blood sugars.  PHYSICAL ACTIVITY/REHABILITATION Goal is 30 minutes at least 4 days per week   Home health physical and occupational therapy through [facility].  Walk with a walker and walk with assistance  Activity decreases your risk of heart attack and stroke and makes your heart stronger.  It helps control your weight and blood pressure; helps you relax and can improve your mood.  Participate in a regular exercise program.  Talk  with your doctor about the best form of exercise for you (dancing, walking, swimming, cycling).  DIET/WEIGHT Goal is to maintain a healthy weight   Cardiac ([facility])  Height: 4' 11.5" (151.1 cm) : Weight: 77.792 kg (171 lb 8 oz) BMI (Calculated): 34.1   Following the type of diet specifically designed for you will help prevent another stroke  Your goal Body Mass Index (BMI) is 19-24.  Healthy food habits can help reduce 3 risk factors for stroke:  High cholesterol, hypertension, and excess weight.  RESOURCES Stroke/Support Group:  Call [phone]  they meet the 3rd Sunday of the month on the Rehab Unit at [facility] [place], [place] ( no meetings June, July & Aug).  STROKE EDUCATION PROVIDED/REVIEWED AND GIVEN TO PATIENT Stroke warning signs and symptoms How to activate emergency medical system (call 911). Medications prescribed at discharge. Need for follow-up after discharge. Personal risk factors for stroke.  My questions have been answered, the writing is legible, and I understand these instructions.  I will adhere to these goals & educational materials that have been provided to me after my discharge from the hospital.

## 2011-09-22 NOTE — Progress Notes (Signed)
Occupational Therapy Evaluation Patient Details Name: [name] MRN: [id number] DOB: 02-Nov-1931 Today's Date: 09/22/2011  Problem List:  Patient Active Problem List  Diagnoses  . HYPERTHYROIDISM  . HYPERCHOLESTEROLEMIA  . DEPRESSION  . HYPERTENSION  . CAD  . CHF  . CVA  . BACK PAIN, LUMBAR  . OSTEOPOROSIS  . MEMORY LOSS  . OTHER URINARY INCONTINENCE  . PACEMAKER, PERMANENT    Past Medical History:  Past Medical History  Diagnosis Date  . Lumbar back pain   . Other urinary incontinence   . Memory loss   . DJD (degenerative joint disease)   . Depression   . Hypertension   . CVA (cerebral infarction)   . CHF (congestive heart failure)     Chronic Diastolic  . CAD (coronary artery disease)   . Presence of permanent cardiac pacemaker   . Osteoporosis   . Hypercholesterolemia   . Hyperthyroidism   . Anxiety   . Vertigo   . Tachy-brady syndrome   . Ischemic cardiomyopathy    Past Surgical History:  Past Surgical History  Procedure Date  . Pacemaker insertion 2011  . Total knee arthroplasty 2002    bilateral  . Abdominal hysterectomy 1986  . Bilateral stenting 1999    to the RCA  . Cardiac catheterization 05/23/10  . Total abdominal hysterectomy w/ bilateral salpingoophorectomy     OT Assessment/Plan/Recommendation OT Assessment Clinical Impression Statement: Pt admitted with admitted with left facial droop and left upper extremity weakness-left upper extremity weakness now resolved.  Pt appears near baseline level but will continue to see for at least one more session to ensure safety during functional transfers. OT Recommendation/Assessment: Patient will need skilled OT in the acute care venue OT Problem List: Decreased activity tolerance;Decreased strength;Decreased cognition OT Therapy Diagnosis : Generalized weakness OT Plan OT Frequency: Min 2X/week OT Treatment/Interventions: Self-care/ADL training;DME and/or AE instruction;Patient/family  education;Therapeutic activities;Cognitive remediation/compensation OT Recommendation Follow Up Recommendations: Home health OT (possibly home safety eval only) Equipment Recommended: None recommended by OT Individuals Consulted Consulted and Agree with Results and Recommendations: Patient OT Goals Acute Rehab OT Goals OT Goal Formulation: With patient Time For Goal Achievement: 7 days ADL Goals Pt Will Transfer to Toilet: with supervision;3-in-1;Ambulation;with DME ADL Goal: Toilet Transfer - Progress: Goal set today Pt Will Perform Tub/Shower Transfer: Shower transfer;with DME;Shower seat with back;Ambulation ADL Goal: Tub/Shower Transfer - Progress: Goal set today  OT Evaluation Precautions/Restrictions  Precautions Precautions: Fall Restrictions Weight Bearing Restrictions: No Prior Functioning Home Living Lives With: Spouse;Son;Family Receives Help From: Family Type of Home: House Bathroom Shower/Tub: Walk-in Soil scientist Toilet: Standard Home Adaptive Equipment: Bedside commode/3-in-1;Shower chair with back Additional Comments: Able to gather bathroom information from husband. Pt with history of Dementia and goes between speaking [place] and [place].  Asked pt to speak in English as this OT does not speak [place] she would smile and keep talking in both languages.   Prior Function Level of Independence: Independent with basic ADLs;Independent with gait;Independent with transfers;Needs assistance with homemaking Driving: No ADL ADL Grooming: Performed;Supervision/safety Where Assessed - Grooming: Standing at sink Lower Body Dressing: Performed;Supervision/safety Lower Body Dressing Details (indicate cue type and reason): Pt donned bil. socks sitting EOB with supervision for safety with dynamic sitting balance task and increased time due to fatigue. Where Assessed - Lower Body Dressing: Sitting, bed Toilet Transfer: Simulated;Other (comment) (min guard) Toilet  Transfer Method: Stand pivot Toilet Transfer Equipment: Other (comment) (bed) Equipment Used: Rolling walker Vision/Perception    Cognition Cognition Arousal/Alertness:  Awake/alert Overall Cognitive Status: Difficult to assess Difficult to assess due to: other (comment) (fluctuates between speaking [place] and [place]. ) Orientation Level: Oriented X4 Sensation/Coordination   Extremity Assessment RUE Assessment RUE Assessment: Within Functional Limits LUE Assessment LUE Assessment: Within Functional Limits Mobility    Exercises   End of Session OT - End of Session Equipment Utilized During Treatment: Gait belt Activity Tolerance: Patient tolerated treatment well Patient left: in bed;with call bell in reach Nurse Communication: Mobility status for transfers General Behavior During Session: [facility] for tasks performed Cognition: Impaired, at baseline  4:23 PM  09/22/2011 Cipriano Mile OTR/L Pager [phone] Office [phone]

## 2011-09-22 NOTE — Progress Notes (Signed)
Physical Therapy Treatment Patient Details Name: [name] MRN: [id number] DOB: 1932/05/09 Today's Date: 09/22/2011  PT Assessment/Plan  PT - Assessment/Plan Comments on Treatment Session: Patient appears close to baseline. May need home health safety eval only. PT Plan: Discharge plan needs to be updated;Frequency remains appropriate PT Frequency: Min 3X/week Follow Up Recommendations: Home health PT;Supervision for mobility/OOB Equipment Recommended: None recommended by PT (per spouse she has a walker that she uses at all times at home) PT Goals  Acute Rehab PT Goals PT Goal: Supine/Side to Sit - Progress: Progressing toward goal PT Goal: Sit to Supine/Side - Progress: Progressing toward goal PT Goal: Sit to Stand - Progress: Met PT Goal: Stand to Sit - Progress: Met PT Goal: Ambulate - Progress: Met  PT Treatment Precautions/Restrictions  Precautions Precautions: Fall Restrictions Weight Bearing Restrictions: No Mobility (including Balance) Bed Mobility Supine to Sit: 6: Modified independent (Device/Increase time) Sitting - Scoot to Edge of Bed: 6: Modified independent (Device/Increase time) Sit to Supine: 6: Modified independent (Device/Increase time) Scooting to [facility]: 6: Modified independent (Device/Increase time) Transfers Sit to Stand: 6: Modified independent (Device/Increase time);From bed;With upper extremity assist Stand to Sit: 6: Modified independent (Device/Increase time);With upper extremity assist;To bed Ambulation/Gait Ambulation/Gait Assistance: 6: Modified independent (Device/Increase time) Ambulation/Gait Assistance Details (indicate cue type and reason): 1.35 feet/second - indicative of falls Ambulation Distance (Feet): 150 Feet Assistive device: Rolling walker Gait Pattern: Step-through pattern;Decreased stride length  Posture/Postural Control Posture/Postural Control:  (Pre-morbid issues leading to walker use) No orthostatic BP issues -see vital  sign section for details End of Session PT - End of Session Equipment Utilized During Treatment: Gait belt Activity Tolerance: Patient tolerated treatment well Patient left: with call bell in reach;in bed;with bed alarm set;with family/visitor present Nurse Communication: Mobility status for ambulation General Behavior During Session: [facility] for tasks performed Cognition: Impaired, at baseline  Edwyna Perfect, PT  Pager [phone]  09/22/2011, 10:41 AM

## 2011-09-22 NOTE — Discharge Summary (Signed)
Discharge Note  Name: [name] MRN: [id number] DOB: August 29, 1931 78 y.o.  Date of Admission: 09/20/2011 11:08 PM Date of Discharge: 09/22/2011 Attending Physician: Kela Millin, MD  Discharge Diagnosis: Principal Problem:  *CVA Active Problems:  HYPERTENSION  CHF  PACEMAKER, PERMANENT   Discharge Medications: Medication List  As of 09/22/2011  4:30 PM   TAKE these medications         alendronate 70 MG tablet   Commonly known as: FOSAMAX   Take 70 mg by mouth every 7 (seven) days. Take in the morning with a full glass of water, on an empty stomach, and do not take anything else by mouth or lie down for the next 30 min.  Takes on Fridays      aspirin 81 MG tablet   Take 81 mg by mouth daily.      Calcium Carbonate-Vit D-Min 600-400 MG-UNIT Tabs   Take by mouth 2 (two) times daily.      CENTRUM SILVER tablet   Take 1 tablet by mouth daily.      clopidogrel 75 MG tablet   Commonly known as: PLAVIX   TAKE 1 TABLET (75 MG TOTAL) BY MOUTH DAILY.      furosemide 40 MG tablet   Commonly known as: LASIX   Take 1 tablet (40 mg total) by mouth daily.      lisinopril 10 MG tablet   Commonly known as: PRINIVIL,ZESTRIL   Take 1 tablet (10 mg total) by mouth daily.      methimazole 5 MG tablet   Commonly known as: TAPAZOLE   TAKE 1 TABLET 3 TIMES A WEEK (MON, WED & FRI)      nitroGLYCERIN 0.4 MG SL tablet   Commonly known as: NITROSTAT   Place 0.4 mg under the tongue every 5 (five) minutes as needed. For chest pain      polyethylene glycol powder powder   Commonly known as: GLYCOLAX/MIRALAX   Take 17 g by mouth daily as needed. constipation      simvastatin 40 MG tablet   Commonly known as: ZOCOR   TAKE 1 TABLET EVERY DAY      spironolactone 25 MG tablet   Commonly known as: ALDACTONE   TAKE 1 TABLET (25 MG TOTAL) BY MOUTH DAILY.      vitamin C 500 MG tablet   Commonly known as: ASCORBIC ACID   Take 500 mg by mouth daily.            Disposition and  follow-up:   [name] was discharged from [facility] in improved/stable condition.    Follow-up Appointments: Discharge Orders    Future Appointments: Provider: Department: Dept Phone: Center:   10/03/2011 1:45 PM Marinus Maw, MD [facility] [phone] [facility]     Future Orders Please Complete By Expires   Diet - low sodium heart healthy      Increase activity slowly      Home Health      Questions: Responses:   To provide the following care/treatments PT    OT   Face-to-face encounter      Comments:   [name] C certify that this patient is under my care and that I, or a nurse practitioner or physician's assistant working with me, had a face-to-face encounter that meets the physician face-to-face encounter requirements with this patient on 09/22/2011.       Questions: Responses:   The encounter with the patient was in whole, or  in part, for the following medical condition, which is the primary reason for home health care probable CVA   I certify that, based on my findings, the following services are medically necessary home health services Physical therapy   My clinical findings support the need for the above services High Risk for rehospitalization   Further, I certify that my clinical findings support that this patient is homebound (i.e. absences from home require considerable and taxing effort and are for medical reasons or religious services or infrequently or of short duration when for other reasons) Unable to leave home safely without assistance   To provide the following care/treatments PT    OT      Consultations: Treatment Team:  Md Stroke, MD  Procedures Performed:  Dg Chest 2 View  09/21/2011  *RADIOLOGY REPORT*  Clinical Data: Cough with weakness  CHEST - 2 VIEW  Comparison: 06/21/2011  Findings: A dual lead pacer is in place via a left subclavian approach with lead wires intact and locations stable.  Lower lung volumes  are present than on the prior exam and taking this into consideration a stable degree of cardiomegaly is identified. Ectasia of the thoracic aorta is again noted and is also stable in degree.  Areas of linear atelectasis are noted in both midlung zones as well as crowding of bronchovascular markings at both lung bases.  No focal infiltrates or signs of congestive failure seen. No pleural fluid is seen.  A stable degree of central peribronchial cuffing is noted.  Bony structures appear stable with degenerative change of the lower chest and spine.  IMPRESSION: Low lung volumes with associated crowding of the bronchovascular markings.  Otherwise stable cardiomegaly, aortic ectasia and bronchitic change.  No new focal or acute abnormality identified.  Original Report Authenticated By: Bertha Stakes, M.D.   Ct Head Wo Contrast  09/21/2011  *RADIOLOGY REPORT*  Clinical Data: 78 year old female status post pacemaker placement. Previous stroke.  Acute onset of weakness, left face droop, left upper extremity weakness.  CT HEAD WITHOUT CONTRAST  Technique:  Contiguous axial images were obtained from the base of the skull through the vertex without contrast.  Comparison: 09/20/2011 and earlier.  Findings: Visualized paranasal sinuses and mastoids are clear. Visualized orbit soft tissues are within normal limits.  Visualized scalp soft tissues are within normal limits.  No acute osseous abnormality identified.  Stable small benign exostosis over the left lateral convexity.  Calcified atherosclerosis at the skull base.  Patchy confluent cerebral white matter hypodensity is stable. No midline shift, mass effect, or evidence of mass lesion.  No ventriculomegaly. No acute intracranial hemorrhage identified.  No evidence of cortically based acute infarction identified.  Chronic cerebellar lacunar infarcts in the left hemisphere. No suspicious intracranial vascular hyperdensity.  IMPRESSION: Stable CT appearance of the brain  with chronic small and medium- sized vessel ischemia.  Original Report Authenticated By: Harley Hallmark, M.D.   Ct Head Wo Contrast  09/20/2011  *RADIOLOGY REPORT*  Clinical Data: Left sided facial droop.  Left arm weakness.  CT HEAD WITHOUT CONTRAST  Technique:  Contiguous axial images were obtained from the base of the skull through the vertex without contrast.  Comparison: Head CT 10/12/2009.  Findings: There is a stable age related cerebral atrophy, ventriculomegaly and periventricular white matter disease.  Remote right basal ganglia infarct is again demonstrated.  No extra-axial fluid collections are identified.  No CT findings for acute hemispheric infarction and/or intracranial hemorrhage.  The brainstem and cerebellum are  grossly normal and stable.  Small lacunar type infarcts are noted in the left cerebellar hemisphere.  No acute bony findings.  The paranasal sinuses and mastoid air cells are clear.  Vascular calcifications are noted.  Globes are intact.  IMPRESSION:  1.  Stable age related cerebral atrophy, ventriculomegaly and periventricular white matter disease. 2.  Remote lacunar type infarcts. 3.  No CT findings for acute hemispheric infarction or intracranial hemorrhage.  Original Report Authenticated By: P. Loralie Champagne, M.D.   Vascular Ultrasound  Carotid Duplex (Doppler) has been completed. Preliminary findings: Bilateral: No hemodynamically significant ICA stenosis. Vertebral artery flow is antegrade.  2D Echo Study Conclusions  - Left ventricle: Technically difficult study. The inferior septum has an unusual shape. IThere may be aneurysmal dilitation at the base of the inferolateral wall. Systolic function was normal. The estimated ejection fraction was in the range of 55% to 60%. - Aortic valve: Mildly calcified leaflets. There was very mild stenosis. Mean gradient: 13mm Hg (S). Peak gradient: 25mm Hg (S). Valve area: 1.25cm^2(VTI). Valve area: 1.2cm^2 (Vmax). - Mitral  valve: Mild regurgitation. - Left atrium: The atrium was mildly dilated. - Impressions: No cardiac source of embolism was identified, but cannot be ruled out on the basis of this examination. Impressions:  - No cardiac source of embolism was identified, but cannot be ruled out on the basis of this examination. Transthoracic echocardiography.   Admission HPI 78 year-old female with known history of CAD status post stenting, tachybradycardia syndrome status post pacemaker placement, diastolic CHF, previous stroke, hypertension last evening when she tried to get up from the couch at home suddenly felt weak and subsequent to which was found to have left facial droop and left upper extremity weakness wherein she was unable to move her left upper extremity. She was brought to the ER by then her left upper extremity weakness has completely resolved but her left facial droop persisted. CT of the head was negative for anything acute. Neurologist on call was consulted and She was admitted for further evaluation and management.   General Appearance:    Alert, cooperative, no distress, appears stated age HEENT-left facial weakness/droop present   Lungs:     Clear to auscultation bilaterally, respirations unlabored   Heart:    Regular rate and rhythm, S1 and S2 normal, no murmur, rub   or gallop  Abdomen:     Soft, non-tender, bowel sounds active all four quadrants,    no masses, no organomegaly  Extremities:   Extremities normal, atraumatic, no cyanosis or edema  Neurologic:   facial asymmetry as above, extremities -normal strength 5 out of 5 and symmetric, sensation and reflexes    throughout     Hospital Course by problem list: Principal Problem:  *CVA Active Problems:  HYPERTENSION  CHF  PACEMAKER, PERMANENT  #1. Left facial weakness Aviva Signs R. Brain CVA  - upon admission the patient had a CT scan of her brain which showed no acute intracranial findings. Since patient has pacemaker an  MRI could not be done. Initially his CT angiogram of the brain was ordered per neurology but family reported that she has a history of allergy to contrast and so this was not done.neurology followed up and will A repeat CT -which showed no acute intracranial findings or changes. A carotid Doppler ultrasound was done which showed no significant ICA stenosis as above. Neurology recommended continuing patient on aspirin and Plavix which showed been on prior to admission. It was noted that she does  have a history of atrial flutter but had not been a Coumadin candidate secondary to fall risk. A 2-D echocardiogram was also done today and came back showing an EF of 55-60% very mild ear ache stenosis noted and no cardiac source of embolism identified . Dr. Pearlean Brownie followed up with patient today and from his standpoint okay to discharge with home health PT OT and I agree. She is to follow up outpatient. #2. History of CAD status post stenting - has no chest pain.  #3. History of hyperthyroidism.  -She was maintained on  methimazole during this hospital stay #4. History of diastolic CHF.  -Compensated.  #5. Hyperlipidemia.  -She was on Zocor which she is to continue upon discharge. #6. History of tachybradycardia syndrome  -status post pacemaker placement.   Discharge Vitals:  BP 130/70  Pulse 66  Temp(Src) 97.9 F (36.6 C) (Oral)  Resp 18  Ht 4' 11.5" (1.511 m)  Wt 77.792 kg (171 lb 8 oz)  BMI 34.06 kg/m2  SpO2 98%  Discharge Labs: No results found for this or any previous visit (from the past 24 hour(s)).  SignedKela Millin 09/22/2011, 4:30 PM

## 2011-09-22 NOTE — Progress Notes (Signed)
Subjective: Patient denies any complaints today. Objective: Vital signs in last 24 hours: Temp:  [97.4 F (36.3 C)-98.6 F (37 C)] 97.9 F (36.6 C) (04/05 1349) Pulse Rate:  [66-86] 66  (04/05 1349) Resp:  [18] 18  (04/05 1349) BP: (124-147)/(70-89) 130/70 mmHg (04/05 1349) SpO2:  [95 %-100 %] 98 % (04/05 1349) Last BM Date: 08/20/11 Intake/Output from previous day: 04/04 0701 - 04/05 0700 In: 1320 [P.O.:720; I.V.:600] Out: 250 [Urine:250] Intake/Output this shift: Total I/O In: 240 [P.O.:240] Out: -     General Appearance:    Alert, cooperative, no distress, appears stated age  Lungs:     Clear to auscultation bilaterally, respirations unlabored   Heart:    Regular rate and rhythm, S1 and S2 normal, no murmur, rub   or gallop  Abdomen:     Soft, non-tender, bowel sounds active all four quadrants,    no masses, no organomegaly  Extremities:   Extremities normal, atraumatic, no cyanosis or edema  Neurologic:   CNII-XII intact, normal strength, sensation and reflexes    throughout    Weight change:   Intake/Output Summary (Last 24 hours) at 09/22/11 1549 Last data filed at 09/22/11 0900  Gross per 24 hour  Intake    960 ml  Output    250 ml  Net    710 ml    Lab Results:   Basename 09/21/11 0532 09/20/11 2340 09/20/11 2324  NA 140 140 --  K 4.1 4.0 --  CL 102 102 --  CO2 29 -- 32  GLUCOSE 109* 155* --  BUN 23 28* --  CREATININE 0.98 1.10 --  CALCIUM 9.2 -- 9.7    Basename 09/21/11 0532 09/20/11 2340 09/20/11 2324  WBC 10.9* -- 14.1*  HGB 13.4 15.6* --  HCT 40.0 46.0 --  PLT 235 -- 234  MCV 90.3 -- 90.5   PT/INR  Basename 09/20/11 2324  LABPROT 14.9  INR 1.14   ABG No results found for this basename: PHART:2,PCO2:2,PO2:2,HCO3:2 in the last 72 hours  Micro Results: No results found for this or any previous visit (from the past 240 hour(s)). Studies/Results: Dg Chest 2 View  09/21/2011  *RADIOLOGY REPORT*  Clinical Data: Cough with weakness   CHEST - 2 VIEW  Comparison: 06/21/2011  Findings: A dual lead pacer is in place via a left subclavian approach with lead wires intact and locations stable.  Lower lung volumes are present than on the prior exam and taking this into consideration a stable degree of cardiomegaly is identified. Ectasia of the thoracic aorta is again noted and is also stable in degree.  Areas of linear atelectasis are noted in both midlung zones as well as crowding of bronchovascular markings at both lung bases.  No focal infiltrates or signs of congestive failure seen. No pleural fluid is seen.  A stable degree of central peribronchial cuffing is noted.  Bony structures appear stable with degenerative change of the lower chest and spine.  IMPRESSION: Low lung volumes with associated crowding of the bronchovascular markings.  Otherwise stable cardiomegaly, aortic ectasia and bronchitic change.  No new focal or acute abnormality identified.  Original Report Authenticated By: Bertha Stakes, M.D.   Ct Head Wo Contrast  09/21/2011  *RADIOLOGY REPORT*  Clinical Data: 78 year old female status post pacemaker placement. Previous stroke.  Acute onset of weakness, left face droop, left upper extremity weakness.  CT HEAD WITHOUT CONTRAST  Technique:  Contiguous axial images were obtained from the base of the skull  through the vertex without contrast.  Comparison: 09/20/2011 and earlier.  Findings: Visualized paranasal sinuses and mastoids are clear. Visualized orbit soft tissues are within normal limits.  Visualized scalp soft tissues are within normal limits.  No acute osseous abnormality identified.  Stable small benign exostosis over the left lateral convexity.  Calcified atherosclerosis at the skull base.  Patchy confluent cerebral white matter hypodensity is stable. No midline shift, mass effect, or evidence of mass lesion.  No ventriculomegaly. No acute intracranial hemorrhage identified.  No evidence of cortically based acute  infarction identified.  Chronic cerebellar lacunar infarcts in the left hemisphere. No suspicious intracranial vascular hyperdensity.  IMPRESSION: Stable CT appearance of the brain with chronic small and medium- sized vessel ischemia.  Original Report Authenticated By: Harley Hallmark, M.D.   Ct Head Wo Contrast  09/20/2011  *RADIOLOGY REPORT*  Clinical Data: Left sided facial droop.  Left arm weakness.  CT HEAD WITHOUT CONTRAST  Technique:  Contiguous axial images were obtained from the base of the skull through the vertex without contrast.  Comparison: Head CT 10/12/2009.  Findings: There is a stable age related cerebral atrophy, ventriculomegaly and periventricular white matter disease.  Remote right basal ganglia infarct is again demonstrated.  No extra-axial fluid collections are identified.  No CT findings for acute hemispheric infarction and/or intracranial hemorrhage.  The brainstem and cerebellum are grossly normal and stable.  Small lacunar type infarcts are noted in the left cerebellar hemisphere.  No acute bony findings.  The paranasal sinuses and mastoid air cells are clear.  Vascular calcifications are noted.  Globes are intact.  IMPRESSION:  1.  Stable age related cerebral atrophy, ventriculomegaly and periventricular white matter disease. 2.  Remote lacunar type infarcts. 3.  No CT findings for acute hemispheric infarction or intracranial hemorrhage.  Original Report Authenticated By: P. Loralie Champagne, M.D.   Carotid Doppler ultrasound Carotid Duplex (Doppler) has been completed. Preliminary findings: Bilateral: No hemodynamically significant ICA stenosis. Vertebral artery flow is antegrade. 2-D echocardiogram -pending Medications:  Scheduled Meds:   . aspirin  81 mg Oral Daily  . clopidogrel  75 mg Oral Q breakfast  . enoxaparin  40 mg Subcutaneous Q24H  . lisinopril  10 mg Oral Daily  . methimazole  5 mg Oral Q M,W,F  . simvastatin  40 mg Oral q1800   Continuous Infusions:   .  sodium chloride 50 mL/hr at 09/21/11 2121   PRN Meds:.nitroGLYCERIN, senna-docusate Assessment/Plan: #1. Probable  R. Brain CVA - since patient has pacemaker an MRI cannot be done. A repeat CT -with no acute changes. continuing aspirin and Plavix as per neuro recommendations -2-D echo is still pending at this time, we'll plan DC home with PT OT  pending results of echo.  #2. History of CAD status post stenting - has no chest pain.  #3. History of hyperthyroidism. -On methimazole #4. History of diastolic CHF.  -Compensated. #5. Hyperlipidemia.  -Continue Zocor #6. History of tachybradycardia syndrome  -status post pacemaker placement.      LOS: 2 days   [place] Latulippe C 09/22/2011, 3:49 PM

## 2011-09-22 NOTE — Progress Notes (Signed)
Stroke Team Progress Note  HISTORY [name] is an 78 y.o. female with a history of stroke and TIA's who was getting up from the couch the evening of 09/20/2011  and became pale acutely. Was then noted to have a left facial droop. When the son tried to get her to lift her arms she did not move the left as well. Has a history of cerebrovascular disease with the left being involved on most occasions. Last major stroke was in 2009. Patient on Plavix and ASA at home. Initial NIHSS of 1. Patient was not a TPA candidate secondary to mild symptoms. she was admitted for further evaluation and treatment.  SUBJECTIVE  . Patient is working with PT. She has a history of atrial flutter, not on coumadin.she is doing well and awaiting DC home likely. Could not get CTA due to contrast allergy and repeat CT shows no acute stroke  OBJECTIVE Filed Vitals:   09/22/11 0200 09/22/11 0600 09/22/11 1003 09/22/11 1017  BP: 137/84 147/82 145/84 124/84  Pulse: 70 72 86   Temp: 98.2 F (36.8 C) 97.4 F (36.3 C) 98.1 F (36.7 C)   TempSrc: Oral Oral Oral   Resp: 18 18 18    Height:      Weight:      SpO2: 96% 95% 100%    CBG (last 3)   Basename 09/20/11 2342  GLUCAP 117*   Intake/Output from previous day: 04/04 0701 - 04/05 0700 In: 1320 [P.O.:720; I.V.:600] Out: 250 [Urine:250]  IV Fluid Intake:      . sodium chloride 50 mL/hr at 09/21/11 2121   Medications     . aspirin  81 mg Oral Daily  . clopidogrel  75 mg Oral Q breakfast  . enoxaparin  40 mg Subcutaneous Q24H  . lisinopril  10 mg Oral Daily  . methimazole  5 mg Oral Q M,W,F  . simvastatin  40 mg Oral q1800  PRN nitroGLYCERIN, senna-docusate  Diet:  Cardiac thin liquids Activity:  Bathroom privileges DVT Prophylaxis:  Lovenox 40 mg sq daily   Significant Diagnostic Studies: CBC    Component Value Date/Time   WBC 10.9* 09/21/2011 0532   RBC 4.43 09/21/2011 0532   HGB 13.4 09/21/2011 0532   HCT 40.0 09/21/2011 0532   PLT 235 09/21/2011  0532   MCV 90.3 09/21/2011 0532   MCH 30.2 09/21/2011 0532   MCHC 33.5 09/21/2011 0532   RDW 13.9 09/21/2011 0532   LYMPHSABS 2.7 09/20/2011 2324   MONOABS 1.2* 09/20/2011 2324   EOSABS 0.2 09/20/2011 2324   BASOSABS 0.0 09/20/2011 2324   CMP    Component Value Date/Time   NA 140 09/21/2011 0532   K 4.1 09/21/2011 0532   CL 102 09/21/2011 0532   CO2 29 09/21/2011 0532   GLUCOSE 109* 09/21/2011 0532   BUN 23 09/21/2011 0532   CREATININE 0.98 09/21/2011 0532   CALCIUM 9.2 09/21/2011 0532   PROT 6.5 09/21/2011 0532   ALBUMIN 3.5 09/21/2011 0532   AST 15 09/21/2011 0532   ALT 12 09/21/2011 0532   ALKPHOS 50 09/21/2011 0532   BILITOT 0.3 09/21/2011 0532   GFRNONAA 54* 09/21/2011 0532   GFRAA 63* 09/21/2011 0532   COAGS Lab Results  Component Value Date   INR 1.14 09/20/2011   INR 1.24 08/20/2010   INR 7.13 CRITICAL RESULT CALLED TO, READ BACK BY AND VERIFIED WITH: S HUGHES,RN,1503,05/23/10,D BRADLEY* 05/23/2010   Lipid Panel    Component Value Date/Time   CHOL 142 09/21/2011  0532   TRIG 104 09/21/2011 0532   HDL 61 09/21/2011 0532   CHOLHDL 2.3 09/21/2011 0532   VLDL 21 09/21/2011 0532   LDLCALC 60 09/21/2011 0532   HgbA1C  Lab Results  Component Value Date   HGBA1C 6.1* 09/21/2011   Urine Drug Screen  No results found for this basename: labopia,  cocainscrnur,  labbenz,  amphetmu,  thcu,  labbarb    Alcohol Level No results found for this basename: eth   CT of the brain  09/20/2011    1.  Stable age related cerebral atrophy, ventriculomegaly and periventricular white matter disease. 2.  Remote lacunar type infarcts. 3.  No CT findings for acute hemispheric infarction or intracranial hemorrhage.  CT angio  Will order  MRI of the brain  pacer  MRA of the brain  pacer  2D Echocardiogram  ordered yet pending  Carotid Doppler  No internal carotid artery stenosis bilaterally. Vertebrals with antegrade flow bilaterally.      EKG  Atrial paced rhythm, see report for full details.   Physical Exam   Pleasant middle-aged lady  currently not in distress. Afebrile. Head is nontraumatic. Neck is supple without bruit. Hearing appears normal. Cardiac exam the murmur gallop. Pacemaker is noted in the left infraclavicular region. Distal pulses well felt. Lungs are clear to auscultation. Neurological exam Awake  Alert oriented x 3. Normal speech and language.eye movements full without nystagmus. Face is mildly asymmetric with left lower face droop. Tongue midline. Normal strength, tone, reflexes and coordination. Normal sensation. Gait deferred.   ASSESSMENT [name] is a 78 y.o. female with a probably right brain stroke, secondary to unknown etiology. Workup underway. On aspirin 81 mg orally every day and clopidogrel 75 mg orally every day prior admission. Now on aspirin 81 mg orally every day and clopidogrel 75 mg orally every day for secondary stroke prevention. Patient with resultant left facial droop   -hx aflutter, not a coumadin candidate at this time secondary to fall risk -pacemaker -baseline memory deficit -hypertension -CVA -CAD -Hypercholesterolemia -ischemic cardiomyopathy  Hospital day # 2  TREATMENT/PLAN - Continue aspirin 81 mg orally every day and clopidogrel 75 mg orally every day for secondary stroke prevention. - Dc home with home PT/OT -D/W patient and husband    Delia Heady, MD  09/22/2011 10:46 AM

## 2011-09-22 NOTE — Progress Notes (Signed)
   CARE MANAGEMENT NOTE 09/22/2011  Patient:  [name], [name]   Account Number:  [id number]  Date Initiated:  09/21/2011  Documentation initiated by:  Alvira Philips Assessment:   78 yr-old female adm with CVA; lives with spouse in apt attached to son's home; has walker, 3-N-1, and shower chair.     Anticipated DC Plan:  HOME W HOME HEALTH SERVICES      DC Planning Services  CM consult      Choice offered to / List presented to:  C-4 Adult Children        HH arranged  HH-2 PT  HH-3 OT      [facility] agency  [facility]   Status of service:  Completed, signed off  Discharge Disposition:  HOME W HOME HEALTH SERVICES  Comments:  PCP:  Dr. [name]  09/22/11 1620 Shannon Petrelli RN MSN CCM Pt to d/c home, will need home health PT/OT.  Provided list of agencies to pt, called son per request.  Referral made as requested.

## 2011-09-26 LAB — GLUCOSE, CAPILLARY
Comment 3: 30046425
Glucose-Capillary: 82 mg/dL (ref 70–99)

## 2011-10-03 ENCOUNTER — Encounter: Payer: Self-pay | Admitting: Internal Medicine

## 2011-10-03 ENCOUNTER — Ambulatory Visit (INDEPENDENT_AMBULATORY_CARE_PROVIDER_SITE_OTHER): Payer: 59 | Admitting: Internal Medicine

## 2011-10-03 VITALS — BP 102/70 | HR 94 | Ht 60.0 in | Wt 171.4 lb

## 2011-10-03 DIAGNOSIS — I6350 Cerebral infarction due to unspecified occlusion or stenosis of unspecified cerebral artery: Secondary | ICD-10-CM

## 2011-10-03 DIAGNOSIS — I2510 Atherosclerotic heart disease of native coronary artery without angina pectoris: Secondary | ICD-10-CM

## 2011-10-03 DIAGNOSIS — I495 Sick sinus syndrome: Secondary | ICD-10-CM | POA: Insufficient documentation

## 2011-10-03 DIAGNOSIS — Z950 Presence of cardiac pacemaker: Secondary | ICD-10-CM

## 2011-10-03 NOTE — Assessment & Plan Note (Signed)
Pacemaker interrogation demonstrates normal function. Review of the histograms demonstrates that she does have paroxysmal atrial fibrillation, less than 1% of the time, but up to 2 hours and 49 minutes induration.

## 2011-10-03 NOTE — Assessment & Plan Note (Signed)
She denies anginal symptoms. She will continue her current medical therapy. 

## 2011-10-03 NOTE — Patient Instructions (Signed)
Your physician wants you to follow-up in: 6 months with device clinic and 12 months with Dr Taylor You will receive a reminder letter in the mail two months in advance. If you don't receive a letter, please call our office to schedule the follow-up appointment.  

## 2011-10-03 NOTE — Progress Notes (Signed)
HPI [name] is referred today for ongoing evaluation and management of her permanent pacemaker in the setting of symptomatic bradycardia. The patient is a very pleasant 78 year old woman from [place]. She has lived in the [place] for approximately 7 years. She has a history of bradycardia and underwent permanent pacemaker insertion, in 2011. She also is a history of coronary disease and is status post stenting. She recently sustained a stroke. The etiology is not known for certain. She feels tired and weak at baseline. She denies anginal symptoms. She has mild peripheral edema. Allergies  Allergen Reactions  . Iodine Other (See Comments)    unknown  . Iohexol      Code: HIVES, Desc: hives with nonionic cotrast 7 yrs ago ([place] iv benedryl given   . Peanut-Containing Drug Products      Current Outpatient Prescriptions  Medication Sig Dispense Refill  . alendronate (FOSAMAX) 70 MG tablet Take 70 mg by mouth every 7 (seven) days. Take in the morning with a full glass of water, on an empty stomach, and do not take anything else by mouth or lie down for the next 30 min. Takes on Fridays      . Ascorbic Acid (VITAMIN C) 500 MG tablet Take 500 mg by mouth daily.        Marland Kitchen aspirin 81 MG tablet Take 81 mg by mouth daily.        . Calcium Carbonate-Vit D-Min 600-400 MG-UNIT TABS Take by mouth 2 (two) times daily.        . clopidogrel (PLAVIX) 75 MG tablet TAKE 1 TABLET (75 MG TOTAL) BY MOUTH DAILY.  30 tablet  6  . furosemide (LASIX) 40 MG tablet Take 1 tablet (40 mg total) by mouth daily.  30 tablet  6  . lisinopril (PRINIVIL,ZESTRIL) 10 MG tablet Take 1 tablet (10 mg total) by mouth daily.  30 tablet  11  . methimazole (TAPAZOLE) 5 MG tablet TAKE 1 TABLET 3 TIMES A WEEK (MON, WED & FRI)  15 tablet  0  . Multiple Vitamins-Minerals (CENTRUM SILVER) tablet Take 1 tablet by mouth daily.        . nitroGLYCERIN (NITROSTAT) 0.4 MG SL tablet Place 0.4 mg under the tongue every 5 (five) minutes as  needed. For chest pain       . polyethylene glycol (GLYCOLAX) powder Take 17 g by mouth daily as needed. constipation      . simvastatin (ZOCOR) 40 MG tablet TAKE 1 TABLET EVERY DAY  30 tablet  6  . spironolactone (ALDACTONE) 25 MG tablet TAKE 1 TABLET (25 MG TOTAL) BY MOUTH DAILY.  30 tablet  3     Past Medical History  Diagnosis Date  . Lumbar back pain   . Other urinary incontinence   . Memory loss   . DJD (degenerative joint disease)   . Depression   . Hypertension   . CVA (cerebral infarction)   . CHF (congestive heart failure)     Chronic Diastolic  . CAD (coronary artery disease)   . Presence of permanent cardiac pacemaker   . Osteoporosis   . Hypercholesterolemia   . Hyperthyroidism   . Anxiety   . Vertigo   . Tachy-brady syndrome   . Ischemic cardiomyopathy     ROS:   All systems reviewed and negative except as noted in the HPI.   Past Surgical History  Procedure Date  . Pacemaker insertion 2011  . Total knee arthroplasty 2002    bilateral  .  Abdominal hysterectomy 1986  . Bilateral stenting 1999    to the RCA  . Cardiac catheterization 05/23/10  . Total abdominal hysterectomy w/ bilateral salpingoophorectomy      Family History  Problem Relation Age of Onset  . Thyroid disease Daughter   . Diabetes Other   . Hypertension Other   . Arthritis Other   . Coronary artery disease Mother 89  . Coronary artery disease Father 77     History   Social History  . Marital Status: Married    Spouse Name: N/A    Number of Children: N/A  . Years of Education: N/A   Occupational History  . Not on file.   Social History Main Topics  . Smoking status: Never Smoker   . Smokeless tobacco: Not on file  . Alcohol Use: No  . Drug Use: No  . Sexually Active: Not on file   Other Topics Concern  . Not on file   Social History Narrative  . No narrative on file     BP 102/70  Pulse 94  Ht 5' (1.524 m)  Wt 77.747 kg (171 lb 6.4 oz)  BMI 33.47 kg/m2   SpO2 99%  Physical Exam:  Elderly appearing 78 year old woman, NAD HEENT: Unremarkable Neck:  No JVD, no thyromegally Lungs:  Clear except for rare basilar rales. No wheezes or rhonchi are present. Well-healed pacemaker incision. HEART:  Regular rate rhythm, no murmurs, no rubs, no clicks Abd:  soft, positive bowel sounds, no organomegally, no rebound, no guarding Ext:  2 plus pulses, no edema, no cyanosis, no clubbing Skin:  No rashes no nodules Neuro:  CN II through XII intact, motor grossly intact   DEVICE  Normal device function.  See PaceArt for details.   Assess/Plan:

## 2011-10-03 NOTE — Assessment & Plan Note (Signed)
The patient's stroke may be related to atrial fibrillation. Unfortunately, she is unsteady on her feet and has fallen. I think systemic anticoagulation with warfarin would be contraindicated at this point and I discussed this issue with her primary cardiologist, Dr. Huston Foley who is in agreement.

## 2011-10-17 ENCOUNTER — Other Ambulatory Visit: Payer: Self-pay | Admitting: Internal Medicine

## 2011-10-17 ENCOUNTER — Other Ambulatory Visit: Payer: Self-pay

## 2011-10-17 MED ORDER — ASPIRIN 81 MG PO TABS: 81.0000 mg | ORAL_TABLET | Freq: Every day | ORAL | Status: DC

## 2011-11-13 ENCOUNTER — Other Ambulatory Visit: Payer: Self-pay | Admitting: Internal Medicine

## 2012-01-03 ENCOUNTER — Encounter: Payer: Self-pay | Admitting: Cardiology

## 2012-01-04 ENCOUNTER — Other Ambulatory Visit: Payer: Self-pay | Admitting: Internal Medicine

## 2012-01-04 MED ORDER — SPIRONOLACTONE 25 MG PO TABS: 25.0000 mg | ORAL_TABLET | Freq: Every day | ORAL | Status: DC

## 2012-01-04 MED ORDER — CLOPIDOGREL BISULFATE 75 MG PO TABS: 75.0000 mg | ORAL_TABLET | Freq: Every day | ORAL | Status: DC

## 2012-01-04 MED ORDER — SIMVASTATIN 40 MG PO TABS: 40.0000 mg | ORAL_TABLET | Freq: Every day | ORAL | Status: DC

## 2012-01-15 ENCOUNTER — Ambulatory Visit: Payer: Medicare Other | Attending: Neurology

## 2012-01-15 DIAGNOSIS — M6281 Muscle weakness (generalized): Secondary | ICD-10-CM | POA: Insufficient documentation

## 2012-01-15 DIAGNOSIS — IMO0001 Reserved for inherently not codable concepts without codable children: Secondary | ICD-10-CM | POA: Insufficient documentation

## 2012-01-15 DIAGNOSIS — R5381 Other malaise: Secondary | ICD-10-CM | POA: Insufficient documentation

## 2012-01-17 ENCOUNTER — Ambulatory Visit: Payer: Medicare Other | Admitting: Physical Therapy

## 2012-01-22 ENCOUNTER — Ambulatory Visit: Payer: Medicare Other | Attending: Internal Medicine | Admitting: Physical Therapy

## 2012-01-22 DIAGNOSIS — M6281 Muscle weakness (generalized): Secondary | ICD-10-CM | POA: Insufficient documentation

## 2012-01-22 DIAGNOSIS — IMO0001 Reserved for inherently not codable concepts without codable children: Secondary | ICD-10-CM | POA: Insufficient documentation

## 2012-01-22 DIAGNOSIS — R5381 Other malaise: Secondary | ICD-10-CM | POA: Insufficient documentation

## 2012-01-24 ENCOUNTER — Ambulatory Visit: Payer: Medicare Other | Admitting: Physical Therapy

## 2012-01-29 ENCOUNTER — Ambulatory Visit: Payer: Medicare Other | Admitting: Physical Therapy

## 2012-01-31 ENCOUNTER — Encounter: Payer: Medicare Other | Admitting: Physical Therapy

## 2012-02-01 ENCOUNTER — Ambulatory Visit: Payer: Medicare Other | Admitting: Physical Therapy

## 2012-02-05 ENCOUNTER — Ambulatory Visit: Payer: Medicare Other | Admitting: Physical Therapy

## 2012-02-07 ENCOUNTER — Encounter: Payer: Medicare Other | Admitting: Physical Therapy

## 2012-02-07 ENCOUNTER — Encounter: Payer: Self-pay | Admitting: Internal Medicine

## 2012-02-08 ENCOUNTER — Ambulatory Visit: Payer: Medicare Other

## 2012-02-12 ENCOUNTER — Ambulatory Visit: Payer: Medicare Other | Admitting: Physical Therapy

## 2012-02-14 ENCOUNTER — Ambulatory Visit: Payer: Medicare Other

## 2012-02-15 ENCOUNTER — Ambulatory Visit (INDEPENDENT_AMBULATORY_CARE_PROVIDER_SITE_OTHER): Payer: Medicare Other | Admitting: Internal Medicine

## 2012-02-15 ENCOUNTER — Encounter: Payer: Self-pay | Admitting: Internal Medicine

## 2012-02-15 VITALS — BP 127/85 | HR 66 | Ht 63.0 in | Wt 172.0 lb

## 2012-02-15 DIAGNOSIS — I2510 Atherosclerotic heart disease of native coronary artery without angina pectoris: Secondary | ICD-10-CM

## 2012-02-15 NOTE — Patient Instructions (Signed)
Your physician wants you to follow-up in:A PRIL 2014  You will receive a reminder letter in the mail two months in advance. If you don't receive a letter, please call our office to schedule the follow-up appointment.  

## 2012-02-15 NOTE — Progress Notes (Signed)
HPI Patient is a 78 year old with a history of bradycardia (s/p PPM), CAD, CVA. Pacer shows evidencee of afib.  She is a fall risk and is not on coumadin. No CP  Breathing is OK  Some constipation.  Occasional dizziness.No palpitaitons.  Allergies  Allergen Reactions  . Iodine Other (See Comments)    unknown  . Iohexol      Code: HIVES, Desc: hives with nonionic cotrast 7 yrs ago ([place] iv benedryl given   . Peanut-Containing Drug Products     Current Outpatient Prescriptions  Medication Sig Dispense Refill  . alendronate (FOSAMAX) 70 MG tablet Take 70 mg by mouth every 7 (seven) days. Take in the morning with a full glass of water, on an empty stomach, and do not take anything else by mouth or lie down for the next 30 min. Takes on Fridays      . Ascorbic Acid (VITAMIN C) 500 MG tablet Take 500 mg by mouth daily.        . Calcium Carbonate-Vit D-Min 600-400 MG-UNIT TABS Take by mouth 2 (two) times daily.        . citalopram (CELEXA) 20 MG tablet 1 tab  daily      . clopidogrel (PLAVIX) 75 MG tablet Take 1 tablet (75 mg total) by mouth daily.  30 tablet  2  . [employer]S ASPIRIN EC 81 MG EC tablet TAKE 1 TABLET (81 MG TOTAL) BY MOUTH DAILY.  30 tablet  6  . furosemide (LASIX) 40 MG tablet Take 1 tablet (40 mg total) by mouth daily.  30 tablet  6  . lisinopril (PRINIVIL,ZESTRIL) 10 MG tablet Take 1 tablet (10 mg total) by mouth daily.  30 tablet  11  . methimazole (TAPAZOLE) 5 MG tablet TAKE 1 TABLET 3 TIMES A WEEK (MON, WED & FRI)  15 tablet  0  . Multiple Vitamins-Minerals (CENTRUM SILVER) tablet Take 1 tablet by mouth daily.        . nitroGLYCERIN (NITROSTAT) 0.4 MG SL tablet Place 0.4 mg under the tongue every 5 (five) minutes as needed. For chest pain       . polyethylene glycol (GLYCOLAX) powder Take 17 g by mouth daily as needed. constipation      . simvastatin (ZOCOR) 40 MG tablet Take 1 tablet (40 mg total) by mouth at bedtime.  30 tablet  2  . spironolactone (ALDACTONE) 25 MG tablet  Take 1 tablet (25 mg total) by mouth daily.  30 tablet  2    Past Medical History  Diagnosis Date  . Lumbar back pain   . Other urinary incontinence   . Memory loss   . DJD (degenerative joint disease)   . Depression   . Hypertension   . CVA (cerebral infarction)   . CHF (congestive heart failure)     Chronic Diastolic  . CAD (coronary artery disease)   . Presence of permanent cardiac pacemaker   . Osteoporosis   . Hypercholesterolemia   . Hyperthyroidism   . Anxiety   . Vertigo   . Tachy-brady syndrome   . Ischemic cardiomyopathy     Past Surgical History  Procedure Date  . Pacemaker insertion 2011  . Total knee arthroplasty 2002    bilateral  . Abdominal hysterectomy 1986  . Bilateral stenting 1999    to the RCA  . Cardiac catheterization 05/23/10  . Total abdominal hysterectomy w/ bilateral salpingoophorectomy     Family History  Problem Relation Age of Onset  .  Thyroid disease Daughter   . Diabetes Other   . Hypertension Other   . Arthritis Other   . Coronary artery disease Mother 46  . Coronary artery disease Father 5    History   Social History  . Marital Status: Married    Spouse Name: N/A    Number of Children: N/A  . Years of Education: N/A   Occupational History  . Not on file.   Social History Main Topics  . Smoking status: Never Smoker   . Smokeless tobacco: Not on file  . Alcohol Use: No  . Drug Use: No  . Sexually Active: Not on file   Other Topics Concern  . Not on file   Social History Narrative  . No narrative on file    Review of Systems:  All systems reviewed.  They are negative to the above problem except as previously stated.  Vital Signs: BP 127/85  Pulse 66  Ht 5\' 3"  (1.6 m)  Wt 172 lb (78.019 kg)  BMI 30.47 kg/m2  Physical Exam Patient is in NAD  Examined in chair  HEENT:  Normocephalic, atraumatic. EOMI, PERRLA.  Neck: JVP is normal.  No bruits.  Lungs: clear to auscultation. No rales no wheezes.  Heart:  Regular rate and rhythm. Normal S1, S2. No S3.   No significant murmurs. PMI not displaced.  Abdomen:  Supple, nontender. Normal bowel sounds. No masses. No hepatomegaly.  Extremities:   Good distal pulses throughout. No lower extremity edema.  Musculoskeletal :moving all extremities.  Neuro:   alert and oriented x3.  CN II-XII grossly intact.  EKG:  Atrial paced.  64 bpm.  LVH with repol abnormality Assessment and Plan:  1.  CAD  No symptoms to suggest angina.  Follow  2.  Bradycardia  S/p PPM  3.  PAF  Patient asymptomatic.  Fall risk  I agree that she is not a good coumadin candidate  Keep on ASA and PLavix.  4.  HL.  Keep on simvistatin.  Will get liptids from Dr Volney Presser office

## 2012-02-20 ENCOUNTER — Ambulatory Visit: Payer: Medicare Other | Attending: Neurology

## 2012-02-20 DIAGNOSIS — IMO0001 Reserved for inherently not codable concepts without codable children: Secondary | ICD-10-CM | POA: Insufficient documentation

## 2012-02-20 DIAGNOSIS — R5381 Other malaise: Secondary | ICD-10-CM | POA: Insufficient documentation

## 2012-02-20 DIAGNOSIS — M6281 Muscle weakness (generalized): Secondary | ICD-10-CM | POA: Insufficient documentation

## 2012-02-22 ENCOUNTER — Ambulatory Visit: Payer: Medicare Other

## 2012-03-06 ENCOUNTER — Encounter: Payer: Self-pay | Admitting: *Deleted

## 2012-03-20 ENCOUNTER — Encounter: Payer: Self-pay | Admitting: Internal Medicine

## 2012-03-20 ENCOUNTER — Ambulatory Visit (INDEPENDENT_AMBULATORY_CARE_PROVIDER_SITE_OTHER): Payer: Medicare Other | Admitting: *Deleted

## 2012-03-20 DIAGNOSIS — I495 Sick sinus syndrome: Secondary | ICD-10-CM

## 2012-03-20 LAB — PACEMAKER DEVICE OBSERVATION
AL IMPEDENCE PM: 457 Ohm
AL IMPEDENCE PM: 470 Ohm
AL THRESHOLD: 0.75 V
ATRIAL PACING PM: 67
ATRIAL PACING PM: 84
BAMS-0001: 140 {beats}/min
BATTERY VOLTAGE: 2.79 V
RV LEAD AMPLITUDE: 22.4 mv
RV LEAD IMPEDENCE PM: 659 Ohm

## 2012-03-20 NOTE — Progress Notes (Signed)
PPM check 

## 2012-05-06 ENCOUNTER — Other Ambulatory Visit: Payer: Self-pay | Admitting: Internal Medicine

## 2012-05-13 ENCOUNTER — Other Ambulatory Visit: Payer: Self-pay | Admitting: Internal Medicine

## 2012-05-15 ENCOUNTER — Other Ambulatory Visit: Payer: Self-pay | Admitting: Cardiology

## 2012-05-15 MED ORDER — ASPIRIN 81 MG PO TBEC: 81.0000 mg | DELAYED_RELEASE_TABLET | Freq: Every day | ORAL | Status: DC

## 2012-06-27 ENCOUNTER — Other Ambulatory Visit: Payer: Self-pay | Admitting: Internal Medicine

## 2012-07-24 ENCOUNTER — Other Ambulatory Visit: Payer: Self-pay | Admitting: Internal Medicine

## 2012-08-16 ENCOUNTER — Other Ambulatory Visit: Payer: Self-pay | Admitting: Internal Medicine

## 2012-09-09 ENCOUNTER — Other Ambulatory Visit: Payer: Self-pay | Admitting: *Deleted

## 2012-09-09 MED ORDER — FUROSEMIDE 40 MG PO TABS: ORAL_TABLET | ORAL | Status: DC

## 2012-10-11 ENCOUNTER — Encounter: Payer: Self-pay | Admitting: Internal Medicine

## 2012-10-11 ENCOUNTER — Ambulatory Visit (INDEPENDENT_AMBULATORY_CARE_PROVIDER_SITE_OTHER): Payer: Medicare Other | Admitting: Internal Medicine

## 2012-10-11 VITALS — BP 116/77 | HR 85 | Wt 180.8 lb

## 2012-10-11 DIAGNOSIS — I4891 Unspecified atrial fibrillation: Secondary | ICD-10-CM | POA: Insufficient documentation

## 2012-10-11 DIAGNOSIS — Z950 Presence of cardiac pacemaker: Secondary | ICD-10-CM

## 2012-10-11 DIAGNOSIS — I10 Essential (primary) hypertension: Secondary | ICD-10-CM

## 2012-10-11 DIAGNOSIS — I495 Sick sinus syndrome: Secondary | ICD-10-CM

## 2012-10-11 LAB — PACEMAKER DEVICE OBSERVATION
AL AMPLITUDE: 5.6 mv
AL IMPEDENCE PM: 438 Ohm
AL THRESHOLD: 0.75 V
BAMS-0001: 140 {beats}/min
BATTERY VOLTAGE: 2.8 V
RV LEAD AMPLITUDE: 15.68 mv

## 2012-10-11 NOTE — Assessment & Plan Note (Signed)
Her blood pressure is well controlled. She will continue her current medical therapy. 

## 2012-10-11 NOTE — Progress Notes (Signed)
HPI [name] returns today for followup. She is a pleasant 79 yo woman with a h/o stroke, symptomatic bradycardia, paroxysmal atrial fibrillation, status post pacemaker insertion. The patient has had no recurrent syncope or falls. She complains of generalized malaise. She denies palpitations. No peripheral edema. She denies dyspnea or chest pain. She is quite sedentary. Allergies  Allergen Reactions  . Iodine Other (See Comments)    unknown  . Iohexol      Code: HIVES, Desc: hives with nonionic cotrast 7 yrs ago ([place] iv benedryl given   . Peanut-Containing Drug Products      Current Outpatient Prescriptions  Medication Sig Dispense Refill  . alendronate (FOSAMAX) 70 MG tablet Take 70 mg by mouth every 7 (seven) days. Take in the morning with a full glass of water, on an empty stomach, and do not take anything else by mouth or lie down for the next 30 min. Takes on Fridays      . Ascorbic Acid (VITAMIN C) 500 MG tablet Take 500 mg by mouth daily.        Marland Kitchen aspirin ([employer]S ASPIRIN EC) 81 MG EC tablet Take 1 tablet (81 mg total) by mouth daily. Swallow whole.  30 tablet  6  . Calcium Carbonate-Vit D-Min 600-400 MG-UNIT TABS Take by mouth 2 (two) times daily.        . clopidogrel (PLAVIX) 75 MG tablet TAKE 1 TABLET BY MOUTH EVERY DAY  30 tablet  6  . furosemide (LASIX) 40 MG tablet Take one tablet once a day for edema  30 tablet  5  . lisinopril (PRINIVIL,ZESTRIL) 10 MG tablet TAKE 1 TABLET (10 MG TOTAL) BY MOUTH DAILY.  30 tablet  8  . methimazole (TAPAZOLE) 5 MG tablet TAKE 1 TABLET 3 TIMES A WEEK (MON, WED & FRI)  15 tablet  0  . Multiple Vitamins-Minerals (CENTRUM SILVER) tablet Take 1 tablet by mouth daily.        . nitroGLYCERIN (NITROSTAT) 0.4 MG SL tablet Place 0.4 mg under the tongue every 5 (five) minutes as needed. For chest pain       . polyethylene glycol (GLYCOLAX) powder Take 17 g by mouth daily as needed. constipation      . simvastatin (ZOCOR) 40 MG tablet TAKE 1 TABLET  EVERY DAY  30 tablet  6  . spironolactone (ALDACTONE) 25 MG tablet TAKE 1 TABLET BY MOUTH EVERY DAY  30 tablet  3   No current facility-administered medications for this visit.     Past Medical History  Diagnosis Date  . Lumbar back pain   . Other urinary incontinence   . Memory loss   . DJD (degenerative joint disease)   . Depression   . Hypertension   . CVA (cerebral infarction)   . CHF (congestive heart failure)     Chronic Diastolic  . CAD (coronary artery disease)   . Presence of permanent cardiac pacemaker   . Osteoporosis   . Hypercholesterolemia   . Hyperthyroidism   . Anxiety   . Vertigo   . Tachy-brady syndrome   . Ischemic cardiomyopathy     ROS:   All systems reviewed and negative except as noted in the HPI.   Past Surgical History  Procedure Laterality Date  . Pacemaker insertion  2011  . Total knee arthroplasty  2002    bilateral  . Abdominal hysterectomy  1986  . Bilateral stenting  1999    to the RCA  . Cardiac catheterization  05/23/10  .  Total abdominal hysterectomy w/ bilateral salpingoophorectomy       Family History  Problem Relation Age of Onset  . Thyroid disease Daughter   . Diabetes Other   . Hypertension Other   . Arthritis Other   . Coronary artery disease Mother 1  . Coronary artery disease Father 89     History   Social History  . Marital Status: Married    Spouse Name: N/A    Number of Children: N/A  . Years of Education: N/A   Occupational History  . Not on file.   Social History Main Topics  . Smoking status: Never Smoker   . Smokeless tobacco: Not on file  . Alcohol Use: No  . Drug Use: No  . Sexually Active: Not on file   Other Topics Concern  . Not on file   Social History Narrative  . No narrative on file     BP 116/77  Pulse 85  Wt 180 lb 12.8 oz (82.01 kg)  BMI 32.04 kg/m2  Physical Exam:  Obese, chronically ill appearing 79 year old woman, NAD HEENT: Unremarkable Neck:  7 cm JVD, no  thyromegally Back:  No CVA tenderness Lungs:  Clear with no wheezes, rales, or rhonchi. HEART:  Regular rate rhythm, no murmurs, no rubs, no clicks Abd:  soft, positive bowel sounds, no organomegally, no rebound, no guarding Ext:  2 plus pulses, no edema, no cyanosis, no clubbing Skin:  No rashes no nodules Neuro:  CN II through XII intact, motor grossly intact DEVICE  Normal device function.  See PaceArt for details.   Assess/Plan:

## 2012-10-11 NOTE — Assessment & Plan Note (Signed)
She is maintaining sinus rhythm 99.9% of the time. She does have occasional bouts of atrial fibrillation which were short lived, typically less than an hour. She is not a candidate for systemic anticoagulation. She will continue her aspirin and Plavix.

## 2012-10-11 NOTE — Patient Instructions (Addendum)
Your physician wants you to follow-up in: 6 months with device clinic and 12 months with Dr Taylor You will receive a reminder letter in the mail two months in advance. If you don't receive a letter, please call our office to schedule the follow-up appointment.  

## 2012-10-11 NOTE — Assessment & Plan Note (Signed)
Her Medtronic dual-chamber pacemaker is working normally. We'll plan to recheck in several months. 

## 2012-10-31 ENCOUNTER — Other Ambulatory Visit: Payer: Self-pay | Admitting: Internal Medicine

## 2012-11-08 ENCOUNTER — Encounter: Payer: Self-pay | Admitting: Cardiology

## 2012-11-12 ENCOUNTER — Encounter: Payer: Self-pay | Admitting: Cardiology

## 2012-11-15 ENCOUNTER — Other Ambulatory Visit: Payer: Self-pay | Admitting: *Deleted

## 2012-11-15 ENCOUNTER — Other Ambulatory Visit: Payer: Self-pay | Admitting: Internal Medicine

## 2012-11-15 ENCOUNTER — Other Ambulatory Visit: Payer: Medicare Other

## 2012-11-15 DIAGNOSIS — I10 Essential (primary) hypertension: Secondary | ICD-10-CM

## 2012-11-15 DIAGNOSIS — E7800 Pure hypercholesterolemia, unspecified: Secondary | ICD-10-CM

## 2012-11-15 DIAGNOSIS — E0590 Thyrotoxicosis, unspecified without thyrotoxic crisis or storm: Secondary | ICD-10-CM

## 2012-11-16 LAB — COMPREHENSIVE METABOLIC PANEL
ALT: 13 IU/L (ref 0–32)
AST: 16 IU/L (ref 0–40)
Albumin/Globulin Ratio: 1.8 (ref 1.1–2.5)
Alkaline Phosphatase: 52 IU/L (ref 39–117)
GFR calc Af Amer: 63 mL/min/{1.73_m2} (ref >59)
GFR calc non Af Amer: 55 mL/min/{1.73_m2} — ABNORMAL LOW (ref >59)
Potassium: 3.9 mmol/L (ref 3.5–5.2)
Sodium: 142 mmol/L (ref 134–144)
Total Bilirubin: 0.4 mg/dL (ref 0.0–1.2)

## 2012-11-16 LAB — CBC WITH DIFFERENTIAL/PLATELET
Basophils Absolute: 0 10*3/uL (ref 0.0–0.2)
Immature Granulocytes: 0 % (ref 0–2)
Lymphocytes Absolute: 3.7 10*3/uL — ABNORMAL HIGH (ref 0.7–3.1)
Lymphs: 50 % — ABNORMAL HIGH (ref 14–46)
MCHC: 33.2 g/dL (ref 31.5–35.7)
Monocytes: 6 % (ref 4–12)
Neutrophils Relative %: 42 % (ref 40–74)
RDW: 14.2 % (ref 12.3–15.4)

## 2012-11-20 ENCOUNTER — Ambulatory Visit (INDEPENDENT_AMBULATORY_CARE_PROVIDER_SITE_OTHER): Payer: Medicare Other | Admitting: Internal Medicine

## 2012-11-20 ENCOUNTER — Encounter: Payer: Self-pay | Admitting: Geriatric Medicine

## 2012-11-20 ENCOUNTER — Encounter: Payer: Self-pay | Admitting: Internal Medicine

## 2012-11-20 VITALS — BP 110/79 | HR 93 | Temp 98.4°F | Resp 16 | Ht 63.0 in | Wt 179.0 lb

## 2012-11-20 DIAGNOSIS — I509 Heart failure, unspecified: Secondary | ICD-10-CM

## 2012-11-20 DIAGNOSIS — M810 Age-related osteoporosis without current pathological fracture: Secondary | ICD-10-CM

## 2012-11-20 DIAGNOSIS — R5381 Other malaise: Secondary | ICD-10-CM

## 2012-11-20 DIAGNOSIS — R413 Other amnesia: Secondary | ICD-10-CM

## 2012-11-20 DIAGNOSIS — I6350 Cerebral infarction due to unspecified occlusion or stenosis of unspecified cerebral artery: Secondary | ICD-10-CM

## 2012-11-20 DIAGNOSIS — E0590 Thyrotoxicosis, unspecified without thyrotoxic crisis or storm: Secondary | ICD-10-CM

## 2012-11-20 DIAGNOSIS — R531 Weakness: Secondary | ICD-10-CM

## 2012-11-20 DIAGNOSIS — Z0000 Encounter for general adult medical examination without abnormal findings: Secondary | ICD-10-CM

## 2012-11-20 DIAGNOSIS — I10 Essential (primary) hypertension: Secondary | ICD-10-CM

## 2012-11-20 NOTE — Progress Notes (Signed)
Failed clock drawing. Given by Trilby Way, CMA. 

## 2012-11-20 NOTE — Progress Notes (Signed)
Subjective:    Patient ID: [name], female    DOB: 14-Feb-1932, 79 y.o.   MRN: [id number]  Chief Complaint  Patient presents with  . Annual Exam    no new problems   Allergies  Allergen Reactions  . Iodine Other (See Comments)    unknown  . Iohexol      Code: HIVES, Desc: hives with nonionic cotrast 7 yrs ago ([place] iv benedryl given   . Peanut-Containing Drug Products     HPI  Patient here with her husband and language interpreter. She has been getting tired easily and feels low in terms of her energy. Does not want any further mammogram- last mammogram 2011 normal dexa scan 2013/2 showed  Osteoporosis and is on alendronate. Has occassional reflux Does not remember when her last colonoscopy was done. Agrees with rectal exam Has history of hysterectomy  uptodate with influenza, pneumococcal and tdap.  Review of Systems  Constitutional: Positive for fatigue. Negative for fever and chills.  HENT: Negative for congestion, sore throat and mouth sores.   Eyes: Negative for visual disturbance.  Respiratory: Negative for cough, chest tightness and shortness of breath.   Cardiovascular: Negative for chest pain, palpitations and leg swelling.  Gastrointestinal: Negative for nausea, vomiting, abdominal pain and constipation.  Genitourinary: Negative for dysuria, hematuria and vaginal discharge.  Musculoskeletal: Positive for gait problem.       Uses walker. Denies any falls/ trauma  Skin: Negative for rash.  Neurological: Positive for dizziness and headaches. Negative for tremors, syncope and light-headedness.       No tinnitus  Hematological: Negative for adenopathy.  Psychiatric/Behavioral: Negative for confusion, sleep disturbance and agitation. The patient is not nervous/anxious.        Losing short term memory, long term memory is intact   Past Medical History  Diagnosis Date  . Lumbar back pain   . Other urinary incontinence   . Memory loss   . DJD (degenerative  joint disease)   . Depression   . Hypertension   . CVA (cerebral infarction)   . CHF (congestive heart failure)     Chronic Diastolic  . CAD (coronary artery disease)   . Presence of permanent cardiac pacemaker   . Osteoporosis   . Hypercholesterolemia   . Hyperthyroidism   . Anxiety   . Vertigo   . Tachy-brady syndrome   . Ischemic cardiomyopathy    Past Surgical History  Procedure Laterality Date  . Pacemaker insertion  2011  . Total knee arthroplasty  2002    bilateral  . Abdominal hysterectomy  1986  . Bilateral stenting  1999    to the RCA  . Cardiac catheterization  05/23/10  . Total abdominal hysterectomy w/ bilateral salpingoophorectomy     History   Social History  . Marital Status: Married    Spouse Name: N/A    Number of Children: N/A  . Years of Education: N/A   Occupational History  . Not on file.   Social History Main Topics  . Smoking status: Never Smoker   . Smokeless tobacco: Not on file  . Alcohol Use: No  . Drug Use: No  . Sexually Active: Not on file   Other Topics Concern  . Not on file   Social History Narrative  . No narrative on file      Objective:   Physical Exam  BP 110/79  Pulse 93  Temp(Src) 98.4 F (36.9 C) (Oral)  Resp 16  Ht 5\' 3"  (  1.6 m)  Wt 179 lb (81.194 kg)  BMI 31.72 kg/m2  SpO2 97%  gen- elderly female, obese, in NAD heent- no pallor, no icterus, no LAD, mmm, no JVD cvs- ns 1,s2, rrr, systolic murmur present respi- b/l cta, no wheeze and rhonchi, no crackles abdo- bs+, soft, non tender Breast- b/l symmetrical, pendulous breasts, no mass or palpable lump Ext- able to move all 4, uses a walker Neuro- no focal deficithas decreased b/l patellar reflex and biceps reflex, normal pinprick sensation and vibration. Has flat affect Skin- dry, has seborrhoic keratosis  Labs reviewed- CBC    Component Value Date/Time   WBC 7.6 11/15/2012 0936   WBC 10.9* 09/21/2011 0532   RBC 4.47 11/15/2012 0936   RBC 4.43  09/21/2011 0532   HGB 13.5 11/15/2012 0936   HCT 40.7 11/15/2012 0936   PLT 235 09/21/2011 0532   MCV 91 11/15/2012 0936   MCH 30.2 11/15/2012 0936   MCH 30.2 09/21/2011 0532   MCHC 33.2 11/15/2012 0936   MCHC 33.5 09/21/2011 0532   RDW 14.2 11/15/2012 0936   RDW 13.9 09/21/2011 0532   LYMPHSABS 3.7* 11/15/2012 0936   LYMPHSABS 2.7 09/20/2011 2324   MONOABS 1.2* 09/20/2011 2324   EOSABS 0.2 11/15/2012 0936   EOSABS 0.2 09/20/2011 2324   BASOSABS 0.0 11/15/2012 0936   BASOSABS 0.0 09/20/2011 2324    CMP     Component Value Date/Time   NA 142 11/15/2012 0936   NA 140 09/21/2011 0532   K 3.9 11/15/2012 0936   CL 100 11/15/2012 0936   CO2 26 11/15/2012 0936   GLUCOSE 103* 11/15/2012 0936   GLUCOSE 109* 09/21/2011 0532   BUN 26 11/15/2012 0936   BUN 23 09/21/2011 0532   CREATININE 0.99 11/15/2012 0936   CALCIUM 9.7 11/15/2012 0936   PROT 6.5 11/15/2012 0936   PROT 6.5 09/21/2011 0532   ALBUMIN 3.5 09/21/2011 0532   AST 16 11/15/2012 0936   ALT 13 11/15/2012 0936   ALKPHOS 52 11/15/2012 0936   BILITOT 0.4 11/15/2012 0936   GFRNONAA 55* 11/15/2012 0936   GFRAA 63 11/15/2012 0936   Lipid Panel     Component Value Date/Time   CHOL 142 09/21/2011 0532   TRIG 104 09/21/2011 0532   HDL 61 09/21/2011 0532   CHOLHDL 2.3 09/21/2011 0532   VLDL 21 09/21/2011 0532   LDLCALC 60 09/21/2011 0532    11/20/12- MMSE- 11/30. Failed clock draw     Assessment & Plan:   Generalized weakness- her dementia, depression, chronic co-morbidities could all be contributing to this debility. Lab works reviewed from 5/14 overall normal. Will have PT referral for strengthening exercises. Will also have cardiology review her medications and adjust dose further if indicated (pt's family do not want the cardiac medications to be adjusted in our office). To use walker, fall precautions. Refuses trial of medication for possible depression  Hypertension- bp normal. Reviewed medications. Continue current regimen  chf- no signs of fluid overload. Pt has  persistent fatigue. Normal labs. bp normal. Follows with cardiologist dr Tenny Craw. Will not make changes ot the medications- asa, plavix, lasix, lisinopril and aldactone. Monitor bmp periodically. Continue statin  Osteoporosis- continue fosamax with ca-vit d supplement  hyperthryoidism- continue methimazole and monitor  Old CVA- bp under control.continue current bp regimen and statin  General exam- FOBT negative. uptodate with immunization. On medication for osteoporosis. Fall precautions. Dietary counselling provided through interpreter  Dementia- persists- is from vascular dementia with her history of cva,  htn, HL and CAD. Low energy level and flat affect could be from a mixture of dementia and depression. Pt refuses medications to help with her mood. No acute behavioral changes reported.  Constipation- tolerating miralax well

## 2012-11-24 DIAGNOSIS — R531 Weakness: Secondary | ICD-10-CM | POA: Insufficient documentation

## 2012-11-28 ENCOUNTER — Other Ambulatory Visit: Payer: Self-pay | Admitting: Internal Medicine

## 2012-12-03 ENCOUNTER — Ambulatory Visit: Payer: Medicare Other | Attending: Internal Medicine

## 2012-12-03 DIAGNOSIS — M6281 Muscle weakness (generalized): Secondary | ICD-10-CM | POA: Insufficient documentation

## 2012-12-03 DIAGNOSIS — R262 Difficulty in walking, not elsewhere classified: Secondary | ICD-10-CM | POA: Insufficient documentation

## 2012-12-03 DIAGNOSIS — R269 Unspecified abnormalities of gait and mobility: Secondary | ICD-10-CM | POA: Insufficient documentation

## 2012-12-03 DIAGNOSIS — IMO0001 Reserved for inherently not codable concepts without codable children: Secondary | ICD-10-CM | POA: Insufficient documentation

## 2012-12-05 ENCOUNTER — Ambulatory Visit: Payer: Medicare Other | Admitting: Physical Therapy

## 2012-12-10 ENCOUNTER — Ambulatory Visit: Payer: Medicare Other | Admitting: Physical Therapy

## 2012-12-12 ENCOUNTER — Ambulatory Visit: Payer: Medicare Other | Admitting: Physical Therapy

## 2012-12-17 ENCOUNTER — Ambulatory Visit: Payer: Medicare Other | Attending: Internal Medicine | Admitting: Physical Therapy

## 2012-12-17 DIAGNOSIS — R269 Unspecified abnormalities of gait and mobility: Secondary | ICD-10-CM | POA: Insufficient documentation

## 2012-12-17 DIAGNOSIS — IMO0001 Reserved for inherently not codable concepts without codable children: Secondary | ICD-10-CM | POA: Insufficient documentation

## 2012-12-17 DIAGNOSIS — R262 Difficulty in walking, not elsewhere classified: Secondary | ICD-10-CM | POA: Insufficient documentation

## 2012-12-17 DIAGNOSIS — M6281 Muscle weakness (generalized): Secondary | ICD-10-CM | POA: Diagnosis not present

## 2012-12-19 ENCOUNTER — Ambulatory Visit: Payer: Medicare Other | Admitting: Physical Therapy

## 2012-12-19 DIAGNOSIS — IMO0001 Reserved for inherently not codable concepts without codable children: Secondary | ICD-10-CM | POA: Diagnosis not present

## 2012-12-23 ENCOUNTER — Ambulatory Visit: Payer: Medicare Other | Admitting: Physical Therapy

## 2012-12-23 DIAGNOSIS — IMO0001 Reserved for inherently not codable concepts without codable children: Secondary | ICD-10-CM | POA: Diagnosis not present

## 2012-12-24 ENCOUNTER — Encounter: Payer: Medicare Other | Admitting: Physical Therapy

## 2012-12-26 ENCOUNTER — Ambulatory Visit: Payer: Medicare Other | Admitting: Physical Therapy

## 2012-12-26 DIAGNOSIS — IMO0001 Reserved for inherently not codable concepts without codable children: Secondary | ICD-10-CM | POA: Diagnosis not present

## 2012-12-31 ENCOUNTER — Ambulatory Visit: Payer: Medicare Other | Admitting: Physical Therapy

## 2012-12-31 DIAGNOSIS — IMO0001 Reserved for inherently not codable concepts without codable children: Secondary | ICD-10-CM | POA: Diagnosis not present

## 2013-01-02 ENCOUNTER — Ambulatory Visit: Payer: Medicare Other

## 2013-01-02 DIAGNOSIS — IMO0001 Reserved for inherently not codable concepts without codable children: Secondary | ICD-10-CM | POA: Diagnosis not present

## 2013-01-07 ENCOUNTER — Ambulatory Visit: Payer: Medicare Other

## 2013-01-07 DIAGNOSIS — IMO0001 Reserved for inherently not codable concepts without codable children: Secondary | ICD-10-CM | POA: Diagnosis not present

## 2013-01-09 ENCOUNTER — Ambulatory Visit: Payer: Medicare Other | Admitting: Physical Therapy

## 2013-01-09 DIAGNOSIS — IMO0001 Reserved for inherently not codable concepts without codable children: Secondary | ICD-10-CM | POA: Diagnosis not present

## 2013-01-14 ENCOUNTER — Ambulatory Visit: Payer: Medicare Other

## 2013-01-14 DIAGNOSIS — IMO0001 Reserved for inherently not codable concepts without codable children: Secondary | ICD-10-CM | POA: Diagnosis not present

## 2013-01-16 ENCOUNTER — Ambulatory Visit: Payer: Medicare Other | Admitting: Physical Therapy

## 2013-01-16 DIAGNOSIS — IMO0001 Reserved for inherently not codable concepts without codable children: Secondary | ICD-10-CM | POA: Diagnosis not present

## 2013-01-20 ENCOUNTER — Other Ambulatory Visit: Payer: Self-pay | Admitting: Internal Medicine

## 2013-01-21 ENCOUNTER — Ambulatory Visit: Payer: Medicare Other | Attending: Internal Medicine | Admitting: Physical Therapy

## 2013-01-21 DIAGNOSIS — IMO0001 Reserved for inherently not codable concepts without codable children: Secondary | ICD-10-CM | POA: Insufficient documentation

## 2013-01-21 DIAGNOSIS — R262 Difficulty in walking, not elsewhere classified: Secondary | ICD-10-CM | POA: Insufficient documentation

## 2013-01-21 DIAGNOSIS — R269 Unspecified abnormalities of gait and mobility: Secondary | ICD-10-CM | POA: Insufficient documentation

## 2013-01-21 DIAGNOSIS — M6281 Muscle weakness (generalized): Secondary | ICD-10-CM | POA: Insufficient documentation

## 2013-01-23 ENCOUNTER — Ambulatory Visit: Payer: Medicare Other

## 2013-01-28 ENCOUNTER — Ambulatory Visit: Payer: Medicare Other | Admitting: Physical Therapy

## 2013-01-30 ENCOUNTER — Ambulatory Visit: Payer: Medicare Other | Admitting: Physical Therapy

## 2013-02-04 ENCOUNTER — Ambulatory Visit: Payer: Medicare Other | Admitting: Physical Therapy

## 2013-02-06 ENCOUNTER — Ambulatory Visit: Payer: Medicare Other

## 2013-02-13 ENCOUNTER — Emergency Department (HOSPITAL_COMMUNITY): Payer: Medicare Other

## 2013-02-13 ENCOUNTER — Observation Stay (HOSPITAL_COMMUNITY)
Admission: EM | Admit: 2013-02-13 | Discharge: 2013-02-18 | Disposition: A | Discharge status: Home / Self Care | Payer: Medicare Other | Attending: Internal Medicine | Admitting: Internal Medicine

## 2013-02-13 ENCOUNTER — Encounter (HOSPITAL_COMMUNITY): Payer: Self-pay | Admitting: Emergency Medicine

## 2013-02-13 DIAGNOSIS — R29898 Other symptoms and signs involving the musculoskeletal system: Principal | ICD-10-CM | POA: Insufficient documentation

## 2013-02-13 DIAGNOSIS — I6350 Cerebral infarction due to unspecified occlusion or stenosis of unspecified cerebral artery: Secondary | ICD-10-CM | POA: Diagnosis present

## 2013-02-13 DIAGNOSIS — Z8673 Personal history of transient ischemic attack (TIA), and cerebral infarction without residual deficits: Secondary | ICD-10-CM

## 2013-02-13 DIAGNOSIS — M1990 Unspecified osteoarthritis, unspecified site: Secondary | ICD-10-CM | POA: Insufficient documentation

## 2013-02-13 DIAGNOSIS — M810 Age-related osteoporosis without current pathological fracture: Secondary | ICD-10-CM | POA: Insufficient documentation

## 2013-02-13 DIAGNOSIS — E0590 Thyrotoxicosis, unspecified without thyrotoxic crisis or storm: Secondary | ICD-10-CM | POA: Insufficient documentation

## 2013-02-13 DIAGNOSIS — M545 Low back pain, unspecified: Secondary | ICD-10-CM | POA: Insufficient documentation

## 2013-02-13 DIAGNOSIS — I509 Heart failure, unspecified: Secondary | ICD-10-CM | POA: Insufficient documentation

## 2013-02-13 DIAGNOSIS — R262 Difficulty in walking, not elsewhere classified: Secondary | ICD-10-CM | POA: Insufficient documentation

## 2013-02-13 DIAGNOSIS — F411 Generalized anxiety disorder: Secondary | ICD-10-CM | POA: Insufficient documentation

## 2013-02-13 DIAGNOSIS — I69998 Other sequelae following unspecified cerebrovascular disease: Secondary | ICD-10-CM | POA: Insufficient documentation

## 2013-02-13 DIAGNOSIS — R531 Weakness: Secondary | ICD-10-CM

## 2013-02-13 DIAGNOSIS — R413 Other amnesia: Secondary | ICD-10-CM | POA: Diagnosis present

## 2013-02-13 DIAGNOSIS — R5381 Other malaise: Secondary | ICD-10-CM | POA: Insufficient documentation

## 2013-02-13 DIAGNOSIS — Z96659 Presence of unspecified artificial knee joint: Secondary | ICD-10-CM | POA: Insufficient documentation

## 2013-02-13 DIAGNOSIS — I4891 Unspecified atrial fibrillation: Secondary | ICD-10-CM | POA: Diagnosis present

## 2013-02-13 DIAGNOSIS — R269 Unspecified abnormalities of gait and mobility: Secondary | ICD-10-CM

## 2013-02-13 DIAGNOSIS — I2510 Atherosclerotic heart disease of native coronary artery without angina pectoris: Secondary | ICD-10-CM | POA: Insufficient documentation

## 2013-02-13 DIAGNOSIS — Z950 Presence of cardiac pacemaker: Secondary | ICD-10-CM | POA: Diagnosis present

## 2013-02-13 DIAGNOSIS — F329 Major depressive disorder, single episode, unspecified: Secondary | ICD-10-CM | POA: Insufficient documentation

## 2013-02-13 DIAGNOSIS — E7800 Pure hypercholesterolemia, unspecified: Secondary | ICD-10-CM | POA: Diagnosis present

## 2013-02-13 DIAGNOSIS — M25569 Pain in unspecified knee: Secondary | ICD-10-CM | POA: Insufficient documentation

## 2013-02-13 DIAGNOSIS — I2589 Other forms of chronic ischemic heart disease: Secondary | ICD-10-CM | POA: Insufficient documentation

## 2013-02-13 DIAGNOSIS — I10 Essential (primary) hypertension: Secondary | ICD-10-CM | POA: Insufficient documentation

## 2013-02-13 DIAGNOSIS — Z7902 Long term (current) use of antithrombotics/antiplatelets: Secondary | ICD-10-CM | POA: Insufficient documentation

## 2013-02-13 DIAGNOSIS — F3289 Other specified depressive episodes: Secondary | ICD-10-CM | POA: Insufficient documentation

## 2013-02-13 DIAGNOSIS — F0390 Unspecified dementia without behavioral disturbance: Secondary | ICD-10-CM | POA: Insufficient documentation

## 2013-02-13 DIAGNOSIS — Z79899 Other long term (current) drug therapy: Secondary | ICD-10-CM | POA: Insufficient documentation

## 2013-02-13 LAB — CBC
HCT: 41.5 % (ref 36.0–46.0)
Hemoglobin: 14 g/dL (ref 12.0–15.0)
RBC: 4.63 MIL/uL (ref 3.87–5.11)
WBC: 9.3 10*3/uL (ref 4.0–10.5)

## 2013-02-13 LAB — BASIC METABOLIC PANEL
BUN: 21 mg/dL (ref 6–23)
CO2: 32 mEq/L (ref 19–32)
Chloride: 98 mEq/L (ref 96–112)
GFR calc non Af Amer: 57 mL/min — ABNORMAL LOW (ref >90)
Glucose, Bld: 90 mg/dL (ref 70–99)
Potassium: 4 mEq/L (ref 3.5–5.1)
Sodium: 139 mEq/L (ref 135–145)

## 2013-02-13 LAB — PROTIME-INR
INR: 1.09 (ref 0.00–1.49)
Prothrombin Time: 13.9 seconds (ref 11.6–15.2)

## 2013-02-13 LAB — URINALYSIS, ROUTINE W REFLEX MICROSCOPIC
Bilirubin Urine: NEGATIVE
Hgb urine dipstick: NEGATIVE
Nitrite: NEGATIVE
Protein, ur: NEGATIVE mg/dL
Specific Gravity, Urine: 1.011 (ref 1.005–1.030)
Urobilinogen, UA: 0.2 mg/dL (ref 0.0–1.0)

## 2013-02-13 LAB — APTT: aPTT: 29 seconds (ref 24–37)

## 2013-02-13 NOTE — ED Notes (Signed)
Pt was using her walker to go to the bathroom and fell onto her left side.  Pt is now c/o left knee pain.  Left leg weakness from previous stroke hx.  Pt speaks mostly [place], some [place].

## 2013-02-13 NOTE — ED Notes (Addendum)
Pt reports "I felt too tired to stand from the toilet."  Pt reports "I've always had this, but this morning it was more."  Increased pain to right leg today.  Pt is able to feel touch.  Denies any chest pain, shortness of breathe, fevers, nausea/vomiting, or abdominal pain.   Pt reports stroke was 4-5 years ago.  Pt reports via translator that she "did not fall at all today". - correction from what was previously reported by EMS.

## 2013-02-13 NOTE — ED Notes (Signed)
Pt is going from xray to get her CT scan done.

## 2013-02-13 NOTE — ED Provider Notes (Signed)
CSN: [id number]     Arrival date & time 02/13/13  1706 History   First MD Initiated Contact with Patient 02/13/13 1706     Chief Complaint  Patient presents with  . Knee Pain   (Consider location/radiation/quality/duration/timing/severity/associated sxs/prior Treatment) The history is provided by the patient and medical records. No language interpreter was used.     [name] is a 79 y.o. female  with a hx of chronic lumbar back pain, memory loss, depression, hypertension, CVA, CHF, CAD, pacemaker, hyperthyroid, anxiety, ischemic cardiomyopathy presents to the Emergency Department complaining of gradual, persistent, progressively worsening weakness beginning this morning. Patient states she used the bathroom several times and felt weaker as the morning progressed. Patient also had associated left knee pain, increased from baseline. She reports a history of partial arthroplasty of bilateral knees.  She denies falling today, syncopal episode.  She also denies fever, chills, headache, neck pain, chest pain, shortness of breath, abdominal pain, nausea, vomiting, diarrhea, dizziness, syncope, hematuria.  His endorses associated dysuria. Nothing seems to make her symptoms of weakness any better or worse.    Son now at bedside reports that patient's onset of weakness was acute after using the bathroom when she was unable to stand up off the commode this morning. He also reports that her left-sided weakness is more pronounced than it has been in the past. He endorses her ability to ambulate with her walker without difficulty until this morning.   Past Medical History  Diagnosis Date  . Lumbar back pain   . Other urinary incontinence   . Memory loss   . DJD (degenerative joint disease)   . Depression   . Hypertension   . CVA (cerebral infarction)   . CHF (congestive heart failure)     Chronic Diastolic  . CAD (coronary artery disease)   . Presence of permanent cardiac pacemaker   .  Osteoporosis   . Hypercholesterolemia   . Hyperthyroidism   . Anxiety   . Vertigo   . Tachy-brady syndrome   . Ischemic cardiomyopathy    Past Surgical History  Procedure Laterality Date  . Pacemaker insertion  2011  . Total knee arthroplasty  2002    bilateral  . Abdominal hysterectomy  1986  . Bilateral stenting  1999    to the RCA  . Cardiac catheterization  05/23/10  . Total abdominal hysterectomy w/ bilateral salpingoophorectomy     Family History  Problem Relation Age of Onset  . Thyroid disease Daughter   . Diabetes Other   . Hypertension Other   . Arthritis Other   . Coronary artery disease Mother 76  . Coronary artery disease Father 3   History  Substance Use Topics  . Smoking status: Never Smoker   . Smokeless tobacco: Not on file  . Alcohol Use: No   OB History   Grav Para Term Preterm Abortions TAB SAB Ect Mult Living                 Review of Systems  Constitutional: Negative for fever, diaphoresis, appetite change, fatigue and unexpected weight change.  HENT: Negative for mouth sores and neck stiffness.   Eyes: Negative for visual disturbance.  Respiratory: Negative for cough, chest tightness, shortness of breath and wheezing.   Cardiovascular: Negative for chest pain.  Gastrointestinal: Negative for nausea, vomiting, abdominal pain, diarrhea and constipation.  Endocrine: Negative for polydipsia, polyphagia and polyuria.  Genitourinary: Negative for dysuria, urgency, frequency and hematuria.  Musculoskeletal: Negative for  back pain.  Skin: Negative for rash.  Allergic/Immunologic: Negative for immunocompromised state.  Neurological: Positive for weakness. Negative for syncope, light-headedness and headaches.  Hematological: Does not bruise/bleed easily.  Psychiatric/Behavioral: Negative for sleep disturbance. The patient is not nervous/anxious.     Allergies  Iodine; Iohexol; and Peanut-containing drug products  Home Medications   Current  Outpatient Rx  Name  Route  Sig  Dispense  Refill  . alendronate (FOSAMAX) 70 MG tablet   Oral   Take 70 mg by mouth every 7 (seven) days. Take in the morning with a full glass of water, on an empty stomach, and do not take anything else by mouth or lie down for the next 30 min. Takes on Fridays         . Ascorbic Acid (VITAMIN C) 500 MG tablet   Oral   Take 500 mg by mouth daily.           . Calcium Carbonate-Vit D-Min 600-400 MG-UNIT TABS   Oral   Take by mouth 2 (two) times daily.           . clopidogrel (PLAVIX) 75 MG tablet      TAKE 1 TABLET BY MOUTH EVERY DAY   30 tablet   6   . [facility] ASPIRIN LOW DOSE 81 MG EC tablet      TAKE 1 TABLET (81 MG TOTAL) BY MOUTH DAILY. SWALLOW WHOLE.   30 tablet   6   . furosemide (LASIX) 40 MG tablet      Take one tablet once a day for edema   30 tablet   5   . lisinopril (PRINIVIL,ZESTRIL) 10 MG tablet      TAKE 1 TABLET (10 MG TOTAL) BY MOUTH DAILY.   30 tablet   8   . methimazole (TAPAZOLE) 5 MG tablet      TAKE 1 TABLET 3 DAYS A WEEK AS PRESCRIBED BY ENDOCRINOLOGY   12 tablet   3   . Multiple Vitamins-Minerals (CENTRUM SILVER) tablet   Oral   Take 1 tablet by mouth daily.           . nitroGLYCERIN (NITROSTAT) 0.4 MG SL tablet   Sublingual   Place 0.4 mg under the tongue every 5 (five) minutes as needed. For chest pain          . polyethylene glycol (GLYCOLAX) powder   Oral   Take 17 g by mouth daily as needed. constipation         . simvastatin (ZOCOR) 40 MG tablet      TAKE 1 TABLET EVERY DAY   30 tablet   6   . spironolactone (ALDACTONE) 25 MG tablet      TAKE 1 TABLET (25 MG TOTAL) BY MOUTH DAILY.   30 tablet   2    BP 113/82  Pulse 85  Temp(Src) 98.5 F (36.9 C) (Oral)  Resp 17  SpO2 99% Physical Exam  Nursing note and vitals reviewed. Constitutional: She is oriented to person, place, and time. She appears well-developed and well-nourished. No distress.  Awake, alert, nontoxic  appearance  HENT:  Head: Normocephalic and atraumatic.  Right Ear: External ear normal.  Left Ear: External ear normal.  Nose: Nose normal.  Mouth/Throat: Uvula is midline, oropharynx is clear and moist and mucous membranes are normal. Mucous membranes are not dry. No oropharyngeal exudate, posterior oropharyngeal edema, posterior oropharyngeal erythema or tonsillar abscesses.  Eyes: Conjunctivae and EOM are normal. Pupils are equal, round,  and reactive to light. No scleral icterus.  Neck: Normal range of motion. Neck supple.  Cardiovascular: Normal rate, regular rhythm, normal heart sounds and intact distal pulses.   No murmur heard. Pulmonary/Chest: Effort normal and breath sounds normal. No respiratory distress. She has no wheezes. She has no rales. She exhibits no tenderness.  Abdominal: Soft. Bowel sounds are normal. She exhibits no distension and no mass. There is no tenderness. There is no rebound and no guarding.  Musculoskeletal: Normal range of motion. She exhibits tenderness. She exhibits no edema.       Left knee: She exhibits effusion (mild). She exhibits normal range of motion, no swelling, no ecchymosis, no deformity, no laceration, no erythema and normal alignment. Tenderness found. Medial joint line and lateral joint line tenderness noted.  Bilateral knee arthroplasty Scar is well-healed Pain to palpation of the left knee  Lymphadenopathy:    She has no cervical adenopathy.  Neurological: She is alert and oriented to person, place, and time. She exhibits normal muscle tone. Coordination normal.  Speech is clear and goal oriented, follows commands Major Cranial nerves without deficit, no facial droop Somewhat decreased strength in left upper and lower extremities bilaterally including dorsiflexion and plantar flexion - more pronounced in the lower extremities strong and equal grip strength bilaterally Sensation normal to light and sharp touch Moves extremities without  ataxia, coordination intact Patient unable to ambulate due to weakness today  Skin: Skin is warm and dry. No rash noted. She is not diaphoretic. No erythema.  Psychiatric: She has a normal mood and affect.    ED Course  Procedures (including critical care time) Labs Review Labs Reviewed  BASIC METABOLIC PANEL - Abnormal; Notable for the following:    GFR calc non Af Amer 57 (*)    GFR calc Af Amer 66 (*)    All other components within normal limits  URINALYSIS, ROUTINE W REFLEX MICROSCOPIC  CBC  TROPONIN I  PROTIME-INR  APTT    ECG:  Date: 02/13/2013  Rate: 60  Rhythm: normal sinus rhythm and paced  QRS Axis: left  Intervals: PR prolonged  ST/T Wave abnormalities: nonspecific T wave changes  Conduction Disutrbances:first-degree A-V block  and left anterior fascicular block  Narrative Interpretation: unchanged from previous 09/20/11  Old EKG Reviewed: unchanged   Imaging Review Dg Chest 2 View  02/13/2013   *RADIOLOGY REPORT*  Clinical Data: Shortness of breath.  Indwelling pacemaker.  CHEST - 2 VIEW  Comparison: Two-view chest x-ray 09/21/2011, 06/21/2011, 10/12/2009.  Findings: Suboptimal inspiration due to body habitus which accounts for crowded bronchovascular markings at the bases and accentuates the cardiac silhouette.  Taking this into account, cardiac silhouette mildly to moderately enlarged but stable.  Left subclavian dual lead transvenous pacemaker leads project over the expected location of the right atrial appendage and right ventricular apex.  Thoracic aorta tortuous and atherosclerotic, unchanged.  Hilar and mediastinal contours otherwise unremarkable. Minimal linear atelectasis in the right middle lobe.  Lungs otherwise clear.  Bronchovascular markings normal.  Pulmonary vascularity normal.  No pleural effusions.  No pneumothorax. Degenerative changes throughout the thoracic spine with exaggeration of the usual thoracic kyphosis.  IMPRESSION: Suboptimal inspiration  with atelectasis in the right middle lobe. No acute cardiopulmonary disease otherwise.  Stable cardiomegaly.   Original Report Authenticated By: Hulan Saas, M.D.   Ct Head Wo Contrast  02/13/2013   *RADIOLOGY REPORT*  Clinical Data: Patient fell onto her left side while walking to the bathroom.  Prior history of  stroke.  CT HEAD WITHOUT CONTRAST  Technique:  Contiguous axial images were obtained from the base of the skull through the vertex without contrast.  Comparison: CT head 09/21/2011, 09/20/2011, 10/12/2009.  MRI brain 10/13/2009.  Findings: Moderate to severe cortical atrophy, moderate deep atrophy, and severe changes of small vessel disease of the white matter diffusely, unchanged.  No mass lesion.  No midline shift. No acute hemorrhage or hematoma.  No extra-axial fluid collections. No evidence of acute infarction.  No significant interval change since 2011.  No skull fracture or other focal osseous abnormality involving the skull.  Visualized paranasal sinuses, bilateral mastoid air cells, and bilateral middle ear cavities well-aerated.  Bilateral carotid siphon and vertebral artery atherosclerosis.  IMPRESSION:  1.  No acute intracranial abnormality. 2.  Stable moderate to severe generalized atrophy and severe chronic microvascular ischemic changes of the white matter.   Original Report Authenticated By: Hulan Saas, M.D.   Dg Knee Complete 4 Views Left  02/13/2013   *RADIOLOGY REPORT*  Clinical Data: Left knee pain  LEFT KNEE - COMPLETE 4+ VIEW  Comparison: None.  Findings:  Osteopenia without definite fracture.  Post replacement of the medial compartment of the right knee.  The caudal aspect of the intramedullary femoral rod is also noted.  No evidence of hardware failure or loosening.  There is a minimal amount of chondrocalcinosis within the remaining lateral joint space.  No joint effusion.  Vascular calcifications.  IMPRESSION:  1.  Osteopenia without definite fracture or dislocation.  2.  Post intramedullary rod fixation and replacement of the medial compartment of the left knee without evidence of hardware failure or loosening.  3.  Chondrocalcinosis suggestive of CPPD.   Original Report Authenticated By: Tacey Ruiz, MD    MDM   1. Left-sided weakness   2. General weakness      Bergen L Sipe resents with sudden onset weakness this morning with left-sided deficit being greater than before. Patient is unable to ambulate here in the department which is a change from yesterday. He said afebrile, alert, nontoxic, nonseptic appearing. She has no leukocytosis and her BMP is without without abnormalities. Her troponin is within normal limits and her ECG was nonischemic; I doubt ACS. Urinalysis is without evidence of urinary tract infection.  CT head is without evidence of acute infarct.  Chest x-ray with suboptimal atelectasis in the right middle lobe but no evidence of pneumonia or pulmonary edema. X-ray of the knee is without acute changes, no evidence of hardware failure or loosening.  Concern for new CVA versus TIA today and patient's inability to walk.  Requested neuro hospitalist consult.  They believe she needs to be a medicine admission as her current weakness cannot be specifically localized to her possible TIA symptoms.  Dr. Cena Benton who will admit.  Dr. Silverio Lay was consulted, evaluated this patient with me and agrees with the plan.       Dahlia Client Emojean Gertz, PA-C 02/13/13 [phone]

## 2013-02-13 NOTE — ED Notes (Signed)
Conversation with PT son. States that PT has had increased bilateral weakness recently. He had found PT sitting on toilet twice today unable to get up. Sates that recent weakness has occurred without known cause

## 2013-02-13 NOTE — ED Notes (Signed)
PA at bedside with translator phone for evaluation.

## 2013-02-13 NOTE — Consult Note (Signed)
NEURO HOSPITALIST CONSULT NOTE    Reason for Consult: acute bilateral LE weakness.  HPI:                                                                                                                                          [name] is an 79 y.o. 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 Patient speaks [place] and her son provides all clinical information.He indicated that [name] is usually able to walk with a walker and today had 2 episodes of sudden onset to use her legs and ambulate. He said that she this morning couldn't get up the commode and walk at all for a while but then at some time later during the day she did walk and was able to go go back to bed by herself. Then, this afternoon had a similar episode and hasn't been able to walk again. She doesn't report numbness, tingling of her legs. No recent fever, infection, trauma, or surgeries.  Denies headache, vertigo, double vision, difficulty swallowing, weakness upper extremities, slurred speech, language or visual impairment.  No recent falls. CT brain upon arrival to ED showed no acute abnormality.  Past Medical History  Diagnosis Date  . Lumbar back pain   . Other urinary incontinence   . Memory loss   . DJD (degenerative joint disease)   . Depression   . Hypertension   . CVA (cerebral infarction)   . CHF (congestive heart failure)     Chronic Diastolic  . CAD (coronary artery disease)   . Presence of permanent cardiac pacemaker   . Osteoporosis   . Hypercholesterolemia   . Hyperthyroidism   . Anxiety   . Vertigo   . Tachy-brady syndrome   . Ischemic cardiomyopathy     Past Surgical History  Procedure Laterality Date  . Pacemaker insertion  2011   . Total knee arthroplasty  2002    bilateral  . Abdominal hysterectomy  1986  . Bilateral stenting  1999    to the RCA  . Cardiac catheterization  05/23/10  . Total abdominal hysterectomy w/ bilateral salpingoophorectomy      Family History  Problem Relation Age of Onset  . Thyroid disease Daughter   . Diabetes Other   . Hypertension Other   . Arthritis Other   . Coronary artery disease Mother 22  . Coronary artery disease Father 5    Social History:  reports that she has never smoked. She does not have any smokeless tobacco history on file. She reports that she does not drink alcohol or use illicit  drugs.  Allergies  Allergen Reactions  . Iodine Other (See Comments)    unknown  . Iohexol      Code: HIVES, Desc: hives with nonionic cotrast 7 yrs ago ([place] iv benedryl given   . Peanut-Containing Drug Products     MEDICATIONS:                                                                                                                     I have reviewed the patient's current medications.   ROS:                                                                                                                                     History obtained from chart review and son  General ROS: negative for - chills, fatigue, fever, night sweats, weight gain or weight loss Psychological ROS: negative for - behavioral disorder, hallucinations, memory difficulties, mood swings or suicidal ideation Ophthalmic ROS: negative for - blurry vision, double vision, eye pain or loss of vision ENT ROS: negative for - epistaxis, nasal discharge, oral lesions, sore throat, tinnitus or vertigo Allergy and Immunology ROS: negative for - hives or itchy/watery eyes Hematological and Lymphatic ROS: negative for - bleeding problems, bruising or swollen lymph nodes Endocrine ROS: negative for - galactorrhea, hair pattern changes, polydipsia/polyuria or temperature intolerance Respiratory ROS: negative  for - cough, hemoptysis, shortness of breath or wheezing Cardiovascular ROS: negative for - chest pain, dyspnea on exertion, edema or irregular heartbeat Gastrointestinal ROS: negative for - abdominal pain, diarrhea, hematemesis, nausea/vomiting or stool incontinence Genito-Urinary ROS: negative for -  hematuria, incontinence or urinary frequency/urgency Musculoskeletal ROS: negative for - joint swelling Neurological ROS: as noted in HPI Dermatological ROS: negative for rash and skin lesion changes     Physical exam: pleasant female in no apparent distress. Blood pressure 113/82, pulse 85, temperature 98.5 F (36.9 C), temperature source Oral, resp. rate 17, SpO2 99.00%. Head: normocephalic. Neck: supple, no bruits, no JVD. Cardiac: no murmurs. Lungs: clear. Abdomen: soft, no tender, no mass. Extremities: no edema.   Neurologic Examination:  Mental Status: Alert, awake.  Speech fluent without evidence of aphasia.  Able to follow 3 step commands without difficulty. Cranial Nerves: II: Discs flat bilaterally; Visual fields grossly normal, pupils equal, round, reactive to light and accommodation III,IV, VI: ptosis not present, extra-ocular motions intact bilaterally V,VII: smile symmetric, facial light touch sensation normal bilaterally VIII: hearing normal bilaterally IX,X: gag reflex present XI: bilateral shoulder shrug XII: midline tongue extension Motor: She seems to be able to move all limbs spontaneously, mild weakness LLE, but overall no striking weakness. Tone and bulk:normal tone throughout; no atrophy noted Sensory: Pinprick and light touch intact throughout, bilaterally Deep Tendon Reflexes:  2 all over  Plantars: Right: downgoing   Left: downgoing Cerebellar: normal finger-to-nose,  normal heel-to-shin test Gait:  Unable to test. CV: pulses palpable throughout     Lab Results  Component Value Date/Time   CHOL 142 09/21/2011  5:32 AM    Results for orders placed during the hospital encounter of 02/13/13 (from the past 48 hour(s))  URINALYSIS, ROUTINE W REFLEX MICROSCOPIC     Status: None   Collection Time    02/13/13  7:31 PM      Result Value Range   Color, Urine YELLOW  YELLOW   APPearance CLEAR  CLEAR   Specific Gravity, Urine 1.011  1.005 - 1.030   pH 7.0  5.0 - 8.0   Glucose, UA NEGATIVE  NEGATIVE mg/dL   Hgb urine dipstick NEGATIVE  NEGATIVE   Bilirubin Urine NEGATIVE  NEGATIVE   Ketones, ur NEGATIVE  NEGATIVE mg/dL   Protein, ur NEGATIVE  NEGATIVE mg/dL   Urobilinogen, UA 0.2  0.0 - 1.0 mg/dL   Nitrite NEGATIVE  NEGATIVE   Leukocytes, UA NEGATIVE  NEGATIVE   Comment: MICROSCOPIC NOT DONE ON URINES WITH NEGATIVE PROTEIN, BLOOD, LEUKOCYTES, NITRITE, OR GLUCOSE <1000 mg/dL.  CBC     Status: None   Collection Time    02/13/13  8:02 PM      Result Value Range   WBC 9.3  4.0 - 10.5 K/uL   RBC 4.63  3.87 - 5.11 MIL/uL   Hemoglobin 14.0  12.0 - 15.0 g/dL   HCT 01.0  27.2 - 53.6 %   MCV 89.6  78.0 - 100.0 fL   MCH 30.2  26.0 - 34.0 pg   MCHC 33.7  30.0 - 36.0 g/dL   RDW 64.4  03.4 - 74.2 %   Platelets 240  150 - 400 K/uL  BASIC METABOLIC PANEL     Status: Abnormal   Collection Time    02/13/13  8:02 PM      Result Value Range   Sodium 139  135 - 145 mEq/L   Potassium 4.0  3.5 - 5.1 mEq/L   Chloride 98  96 - 112 mEq/L   CO2 32  19 - 32 mEq/L   Glucose, Bld 90  70 - 99 mg/dL   BUN 21  6 - 23 mg/dL   Creatinine, Ser 5.95  0.50 - 1.10 mg/dL   Calcium 63.8  8.4 - 75.6 mg/dL   GFR calc non Af Amer 57 (*) >90 mL/min   GFR calc Af Amer 66 (*) >90 mL/min   Comment: (NOTE)     The eGFR has been calculated using the CKD EPI equation.     This calculation has not been validated in all clinical situations.     eGFR's persistently <90 mL/min signify possible Chronic Kidney     Disease.  TROPONIN I  Status: None   Collection Time     02/13/13  8:02 PM      Result Value Range   Troponin I <0.30  <0.30 ng/mL   Comment:            Due to the release kinetics of cTnI,     a negative result within the first hours     of the onset of symptoms does not rule out     myocardial infarction with certainty.     If myocardial infarction is still suspected,     repeat the test at appropriate intervals.  PROTIME-INR     Status: None   Collection Time    02/13/13  8:02 PM      Result Value Range   Prothrombin Time 13.9  11.6 - 15.2 seconds   INR 1.09  0.00 - 1.49  APTT     Status: None   Collection Time    02/13/13  8:02 PM      Result Value Range   aPTT 29  24 - 37 seconds    Dg Chest 2 View  02/13/2013   *RADIOLOGY REPORT*  Clinical Data: Shortness of breath.  Indwelling pacemaker.  CHEST - 2 VIEW  Comparison: Two-view chest x-ray 09/21/2011, 06/21/2011, 10/12/2009.  Findings: Suboptimal inspiration due to body habitus which accounts for crowded bronchovascular markings at the bases and accentuates the cardiac silhouette.  Taking this into account, cardiac silhouette mildly to moderately enlarged but stable.  Left subclavian dual lead transvenous pacemaker leads project over the expected location of the right atrial appendage and right ventricular apex.  Thoracic aorta tortuous and atherosclerotic, unchanged.  Hilar and mediastinal contours otherwise unremarkable. Minimal linear atelectasis in the right middle lobe.  Lungs otherwise clear.  Bronchovascular markings normal.  Pulmonary vascularity normal.  No pleural effusions.  No pneumothorax. Degenerative changes throughout the thoracic spine with exaggeration of the usual thoracic kyphosis.  IMPRESSION: Suboptimal inspiration with atelectasis in the right middle lobe. No acute cardiopulmonary disease otherwise.  Stable cardiomegaly.   Original Report Authenticated By: Hulan Saas, M.D.   Ct Head Wo Contrast  02/13/2013   *RADIOLOGY REPORT*  Clinical Data: Patient fell onto  her left side while walking to the bathroom.  Prior history of stroke.  CT HEAD WITHOUT CONTRAST  Technique:  Contiguous axial images were obtained from the base of the skull through the vertex without contrast.  Comparison: CT head 09/21/2011, 09/20/2011, 10/12/2009.  MRI brain 10/13/2009.  Findings: Moderate to severe cortical atrophy, moderate deep atrophy, and severe changes of small vessel disease of the white matter diffusely, unchanged.  No mass lesion.  No midline shift. No acute hemorrhage or hematoma.  No extra-axial fluid collections. No evidence of acute infarction.  No significant interval change since 2011.  No skull fracture or other focal osseous abnormality involving the skull.  Visualized paranasal sinuses, bilateral mastoid air cells, and bilateral middle ear cavities well-aerated.  Bilateral carotid siphon and vertebral artery atherosclerosis.  IMPRESSION:  1.  No acute intracranial abnormality. 2.  Stable moderate to severe generalized atrophy and severe chronic microvascular ischemic changes of the white matter.   Original Report Authenticated By: Hulan Saas, M.D.   Dg Knee Complete 4 Views Left  02/13/2013   *RADIOLOGY REPORT*  Clinical Data: Left knee pain  LEFT KNEE - COMPLETE 4+ VIEW  Comparison: None.  Findings:  Osteopenia without definite fracture.  Post replacement of the medial compartment of the right knee.  The caudal aspect  of the intramedullary femoral rod is also noted.  No evidence of hardware failure or loosening.  There is a minimal amount of chondrocalcinosis within the remaining lateral joint space.  No joint effusion.  Vascular calcifications.  IMPRESSION:  1.  Osteopenia without definite fracture or dislocation. 2.  Post intramedullary rod fixation and replacement of the medial compartment of the left knee without evidence of hardware failure or loosening.  3.  Chondrocalcinosis suggestive of CPPD.   Original Report Authenticated By: Tacey Ruiz, MD      Assessment/Plan: 79 years old with multiple medical problems and apparent new onset bilateral LE weakness but with unimpressive neuro-exam including normal DTRs. CT brain showed no acute abnormality. Differential bilateral LE weakness include brainstem stroke, acute myelopathy, GBS, but her neuro-exam is not pinpointing in that direction. She can not have MRI due to pacemaker. Can not exclude the possibility of gait dysfunction in the context of underlying dementia, or acute infection worsening previous deficit. Suggest admission to medicine, PT, and repeat CT brain in next 24 hours. Will follow up.   Wyatt Portela ,MD [facility] [phone]  02/13/2013, 10:43 PM

## 2013-02-13 NOTE — ED Notes (Signed)
MD at bedside. Yao MD 

## 2013-02-14 DIAGNOSIS — Z8673 Personal history of transient ischemic attack (TIA), and cerebral infarction without residual deficits: Secondary | ICD-10-CM

## 2013-02-14 DIAGNOSIS — R269 Unspecified abnormalities of gait and mobility: Secondary | ICD-10-CM | POA: Diagnosis present

## 2013-02-14 LAB — MAGNESIUM: Magnesium: 2.3 mg/dL (ref 1.5–2.5)

## 2013-02-14 LAB — CBC
MCH: 30.7 pg (ref 26.0–34.0)
MCHC: 34.3 g/dL (ref 30.0–36.0)
Platelets: 240 10*3/uL (ref 150–400)

## 2013-02-14 LAB — BASIC METABOLIC PANEL
Calcium: 9.7 mg/dL (ref 8.4–10.5)
GFR calc non Af Amer: 78 mL/min — ABNORMAL LOW (ref >90)
Sodium: 140 mEq/L (ref 135–145)

## 2013-02-14 LAB — PHOSPHORUS: Phosphorus: 4.1 mg/dL (ref 2.3–4.6)

## 2013-02-14 MED ORDER — NITROGLYCERIN 0.4 MG SL SUBL: 0.4000 mg | SUBLINGUAL_TABLET | SUBLINGUAL | Status: DC | PRN

## 2013-02-14 MED ORDER — ONDANSETRON HCL 4 MG/2ML IJ SOLN: 4.0000 mg | Freq: Four times a day (QID) | INTRAMUSCULAR | Status: DC | PRN

## 2013-02-14 MED ORDER — SODIUM CHLORIDE 0.9 % IJ SOLN
3.0000 mL | Freq: Two times a day (BID) | INTRAMUSCULAR | Status: DC
  Administered 2013-02-14 – 2013-02-18 (×9): 3 mL via INTRAVENOUS

## 2013-02-14 MED ORDER — ONDANSETRON HCL 4 MG PO TABS: 4.0000 mg | ORAL_TABLET | Freq: Four times a day (QID) | ORAL | Status: DC | PRN

## 2013-02-14 MED ORDER — SIMVASTATIN 40 MG PO TABS
40.0000 mg | ORAL_TABLET | Freq: Every evening | ORAL | Status: DC
  Administered 2013-02-14 – 2013-02-17 (×4): 40 mg via ORAL
  Filled 2013-02-14 (×5): qty 1

## 2013-02-14 MED ORDER — LISINOPRIL 10 MG PO TABS
10.0000 mg | ORAL_TABLET | Freq: Every day | ORAL | Status: DC
  Administered 2013-02-14 – 2013-02-18 (×5): 10 mg via ORAL
  Filled 2013-02-14 (×5): qty 1

## 2013-02-14 MED ORDER — POLYETHYLENE GLYCOL 3350 17 G PO PACK
17.0000 g | PACK | Freq: Every day | ORAL | Status: DC | PRN
  Administered 2013-02-17: 17 g via ORAL
  Filled 2013-02-14: qty 1

## 2013-02-14 MED ORDER — VITAMIN C 500 MG PO TABS
500.0000 mg | ORAL_TABLET | Freq: Every day | ORAL | Status: DC
  Administered 2013-02-14 – 2013-02-18 (×5): 500 mg via ORAL
  Filled 2013-02-14 (×5): qty 1

## 2013-02-14 MED ORDER — CLOPIDOGREL BISULFATE 75 MG PO TABS
75.0000 mg | ORAL_TABLET | Freq: Every day | ORAL | Status: DC
  Administered 2013-02-14 – 2013-02-18 (×5): 75 mg via ORAL
  Filled 2013-02-14 (×5): qty 1

## 2013-02-14 MED ORDER — SODIUM CHLORIDE 0.9 % IJ SOLN: 3.0000 mL | INTRAMUSCULAR | Status: DC | PRN

## 2013-02-14 MED ORDER — ALENDRONATE SODIUM 70 MG PO TABS: 70.0000 mg | ORAL_TABLET | ORAL | Status: DC

## 2013-02-14 MED ORDER — SODIUM CHLORIDE 0.9 % IV SOLN: 250.0000 mL | INTRAVENOUS | Status: DC | PRN

## 2013-02-14 MED ORDER — ASPIRIN EC 81 MG PO TBEC
81.0000 mg | DELAYED_RELEASE_TABLET | Freq: Every day | ORAL | Status: DC
  Administered 2013-02-14 – 2013-02-18 (×5): 81 mg via ORAL
  Filled 2013-02-14 (×5): qty 1

## 2013-02-14 MED ORDER — POLYETHYLENE GLYCOL 3350 17 GM/SCOOP PO POWD
17.0000 g | Freq: Every day | ORAL | Status: DC | PRN
  Filled 2013-02-14: qty 255

## 2013-02-14 MED ORDER — METHIMAZOLE 5 MG PO TABS
5.0000 mg | ORAL_TABLET | ORAL | Status: DC
  Administered 2013-02-17: 5 mg via ORAL
  Filled 2013-02-14 (×3): qty 1

## 2013-02-14 MED ORDER — SPIRONOLACTONE 25 MG PO TABS
25.0000 mg | ORAL_TABLET | Freq: Every day | ORAL | Status: DC
  Administered 2013-02-14 – 2013-02-18 (×5): 25 mg via ORAL
  Filled 2013-02-14 (×5): qty 1

## 2013-02-14 NOTE — Progress Notes (Signed)
INITIAL NUTRITION ASSESSMENT  DOCUMENTATION CODES Per approved criteria  -Obesity Unspecified   INTERVENTION: 1. Patient appears well nourished, appetite has been good and no significant weight loss. No nutrition interventions warranted at this time.   NUTRITION DIAGNOSIS: No nutrition dx at this time   Goal: PO intake to meet >/=90% estimated nutrition needs.   Monitor:  Po intake, weight trends  Reason for Assessment: Consult  79 y.o. female  Admitting Dx: Generalized weakness  ASSESSMENT: Pt admitted after having periods of increased weakness and inability to walk at home.  RD consult to assess if malnutrition was playing a role in current weakness.  Weight hx reviewed, weight appears stable in the past several months.  Chart reviewed, pt has had several office visits with c/o fatigue and generalized weakness.  Pt unable to provide hx as speaks very little english. Spoke with husband who states that the pt has been eating well and gaining weight recently.   Height: Ht Readings from Last 1 Encounters:  02/14/13 5\' 1"  (1.549 m)    Weight: Wt Readings from Last 1 Encounters:  02/14/13 177 lb 14.6 oz (80.7 kg)    Ideal Body Weight: 105 lbs   % Ideal Body Weight: 169%  Wt Readings from Last 10 Encounters:  02/14/13 177 lb 14.6 oz (80.7 kg)  11/20/12 179 lb (81.194 kg)  10/11/12 180 lb 12.8 oz (82.01 kg)  02/15/12 172 lb (78.019 kg)  10/03/11 171 lb 6.4 oz (77.747 kg)  09/21/11 171 lb 8 oz (77.792 kg)  07/24/11 172 lb (78.019 kg)  03/30/11 168 lb (76.204 kg)  05/20/10 166 lb (75.297 kg)    Usual Body Weight: 180 lbs   % Usual Body Weight: 98%  BMI:  Body mass index is 33.63 kg/(m^2). Obesity class 1   Estimated Nutritional Needs: Kcal: 1450-1650 Protein: 55-65 gm  Fluid: 1.5-1.7 L  Skin: intact   Diet Order: Cardiac  EDUCATION NEEDS: -No education needs identified at this time   Intake/Output Summary (Last 24 hours) at 02/14/13 1050 Last data  filed at 02/14/13 0700  Gross per 24 hour  Intake    123 ml  Output      0 ml  Net    123 ml    Last BM: PTA   Labs:   Recent Labs Lab 02/13/13 2002 02/14/13 0500  NA 139 140  K 4.0 4.0  CL 98 101  CO2 32 29  BUN 21 19  CREATININE 0.94 0.79  CALCIUM 10.1 9.7  MG  --  2.3  PHOS  --  4.1  GLUCOSE 90 111*    CBG (last 3)  No results found for this basename: GLUCAP,  in the last 72 hours  Scheduled Meds: . aspirin EC  81 mg Oral Daily  . clopidogrel  75 mg Oral Daily  . lisinopril  10 mg Oral Daily  . methimazole  5 mg Oral 3 times weekly  . simvastatin  40 mg Oral QPM  . sodium chloride  3 mL Intravenous Q12H  . spironolactone  25 mg Oral Daily  . vitamin C  500 mg Oral Daily    Continuous Infusions:   Past Medical History  Diagnosis Date  . Lumbar back pain   . Other urinary incontinence   . Memory loss   . DJD (degenerative joint disease)   . Depression   . Hypertension   . CVA (cerebral infarction)   . CHF (congestive heart failure)     Chronic Diastolic  .  CAD (coronary artery disease)   . Presence of permanent cardiac pacemaker   . Osteoporosis   . Hypercholesterolemia   . Hyperthyroidism   . Anxiety   . Vertigo   . Tachy-brady syndrome   . Ischemic cardiomyopathy     Past Surgical History  Procedure Laterality Date  . Pacemaker insertion  2011  . Total knee arthroplasty  2002    bilateral  . Abdominal hysterectomy  1986  . Bilateral stenting  1999    to the RCA  . Cardiac catheterization  05/23/10  . Total abdominal hysterectomy w/ bilateral salpingoophorectomy      Clarene Duke RD, LDN Pager [phone] After Hours pager [phone]

## 2013-02-14 NOTE — Progress Notes (Signed)
Rehab Admissions Coordinator Note:  Patient was screened by Brock Ra for appropriateness for an Inpatient Acute Rehab Consult.  The first opportunity to do a Rehab consult would be Monday, and, if pt is still in the hospital then, SNF could be considered, which would be the better option d/t pt's limited assist at home.  Brock Ra 02/14/2013, 5:14 PM  I can be reached at [phone].

## 2013-02-14 NOTE — Progress Notes (Signed)
Called ED for report at [phone].

## 2013-02-14 NOTE — Progress Notes (Addendum)
Subjective: Patient admitted with complaints of BLE weakness.  On examination does not clearly have any lower extremity weakness but seems to have some features of a gait apraxia.  Is able to walk today though with assistance.  Patient unable to give much in the way of history secondary to dementia.  Objective: Current vital signs: BP 98/48  Pulse 71  Temp(Src) 98.4 F (36.9 C) (Oral)  Resp 18  Ht 5\' 1"  (1.549 m)  Wt 80.7 kg (177 lb 14.6 oz)  BMI 33.63 kg/m2  SpO2 96% Vital signs in last 24 hours: Temp:  [97.6 F (36.4 C)-98.5 F (36.9 C)] 98.4 F (36.9 C) (08/29 1352) Pulse Rate:  [61-85] 71 (08/29 1352) Resp:  [16-23] 18 (08/29 1352) BP: (98-123)/(48-82) 98/48 mmHg (08/29 1352) SpO2:  [95 %-99 %] 96 % (08/29 1352) Weight:  [80.7 kg (177 lb 14.6 oz)] 80.7 kg (177 lb 14.6 oz) (08/29 0305)  Intake/Output from previous day: 08/28 0701 - 08/29 0700 In: 123 [P.O.:120; I.V.:3] Out: -  Intake/Output this shift: Total I/O In: 240 [P.O.:240] Out: -  Nutritional status: Cardiac  Neurologic Exam: Mental Status:  Alert, awake. Speech fluent without evidence of aphasia. Able to follow 3 step commands without difficulty.  Cranial Nerves:  II: Discs flat bilaterally; Visual fields grossly normal, pupils equal, round, reactive to light and accommodation  III,IV, VI: ptosis not present, extra-ocular motions intact bilaterally  V,VII: smile symmetric, facial light touch sensation normal bilaterally  VIII: hearing normal bilaterally  IX,X: gag reflex present  XI: bilateral shoulder shrug  XII: midline tongue extension  Motor:  Gives full strength throughout.  Mildly increased tone throughout with right being greater than the left Sensory: Pinprick and light touch intact throughout, bilaterally  Deep Tendon Reflexes:  2+ throughout  Plantars:  Right: downgoing    Left: downgoing  Cerebellar:  Normal finger-to-nose, normal heel-to-shin test.  No tremor  Gait:  Patient able to  ambulate using my arms as a walker.  Uses a walker at home.    Lab Results: Basic Metabolic Panel:  Recent Labs Lab 02/13/13 2002 02/14/13 0500  NA 139 140  K 4.0 4.0  CL 98 101  CO2 32 29  GLUCOSE 90 111*  BUN 21 19  CREATININE 0.94 0.79  CALCIUM 10.1 9.7  MG  --  2.3  PHOS  --  4.1    Liver Function Tests: No results found for this basename: AST, ALT, ALKPHOS, BILITOT, PROT, ALBUMIN,  in the last 168 hours No results found for this basename: LIPASE, AMYLASE,  in the last 168 hours No results found for this basename: AMMONIA,  in the last 168 hours  CBC:  Recent Labs Lab 02/13/13 2002 02/14/13 0500  WBC 9.3 8.8  HGB 14.0 13.8  HCT 41.5 40.2  MCV 89.6 89.5  PLT 240 240    Cardiac Enzymes:  Recent Labs Lab 02/13/13 2002  TROPONINI <0.30    Lipid Panel: No results found for this basename: CHOL, TRIG, HDL, CHOLHDL, VLDL, LDLCALC,  in the last 168 hours  CBG: No results found for this basename: GLUCAP,  in the last 168 hours  Microbiology: Results for orders placed during the hospital encounter of 08/20/10  URINE CULTURE     Status: None   Collection Time    08/20/10  1:15 PM      Result Value Range Status   Specimen Description URINE, CLEAN CATCH   Final   Special Requests NONE   Final  Culture  Setup Time [id number]   Final   Colony Count NO GROWTH   Final   Culture NO GROWTH   Final   Report Status 08/21/2010 FINAL   Final  SURGICAL PCR SCREEN     Status: None   Collection Time    08/21/10  4:18 AM      Result Value Range Status   MRSA, PCR NEGATIVE  NEGATIVE Final   Staphylococcus aureus    NEGATIVE Final   Value: NEGATIVE            The Xpert SA Assay (FDA     approved for NASAL specimens     only), is one component of     a comprehensive surveillance     program.  It is not intended     to diagnose infection nor to     guide or monitor treatment.    Coagulation Studies:  Recent Labs  02/13/13 2002  LABPROT 13.9  INR 1.09     Imaging: Dg Chest 2 View  02/13/2013   *RADIOLOGY REPORT*  Clinical Data: Shortness of breath.  Indwelling pacemaker.  CHEST - 2 VIEW  Comparison: Two-view chest x-ray 09/21/2011, 06/21/2011, 10/12/2009.  Findings: Suboptimal inspiration due to body habitus which accounts for crowded bronchovascular markings at the bases and accentuates the cardiac silhouette.  Taking this into account, cardiac silhouette mildly to moderately enlarged but stable.  Left subclavian dual lead transvenous pacemaker leads project over the expected location of the right atrial appendage and right ventricular apex.  Thoracic aorta tortuous and atherosclerotic, unchanged.  Hilar and mediastinal contours otherwise unremarkable. Minimal linear atelectasis in the right middle lobe.  Lungs otherwise clear.  Bronchovascular markings normal.  Pulmonary vascularity normal.  No pleural effusions.  No pneumothorax. Degenerative changes throughout the thoracic spine with exaggeration of the usual thoracic kyphosis.  IMPRESSION: Suboptimal inspiration with atelectasis in the right middle lobe. No acute cardiopulmonary disease otherwise.  Stable cardiomegaly.   Original Report Authenticated By: Hulan Saas, M.D.   Ct Head Wo Contrast  02/13/2013   *RADIOLOGY REPORT*  Clinical Data: Patient fell onto her left side while walking to the bathroom.  Prior history of stroke.  CT HEAD WITHOUT CONTRAST  Technique:  Contiguous axial images were obtained from the base of the skull through the vertex without contrast.  Comparison: CT head 09/21/2011, 09/20/2011, 10/12/2009.  MRI brain 10/13/2009.  Findings: Moderate to severe cortical atrophy, moderate deep atrophy, and severe changes of small vessel disease of the white matter diffusely, unchanged.  No mass lesion.  No midline shift. No acute hemorrhage or hematoma.  No extra-axial fluid collections. No evidence of acute infarction.  No significant interval change since 2011.  No skull fracture or  other focal osseous abnormality involving the skull.  Visualized paranasal sinuses, bilateral mastoid air cells, and bilateral middle ear cavities well-aerated.  Bilateral carotid siphon and vertebral artery atherosclerosis.  IMPRESSION:  1.  No acute intracranial abnormality. 2.  Stable moderate to severe generalized atrophy and severe chronic microvascular ischemic changes of the white matter.   Original Report Authenticated By: Hulan Saas, M.D.   Dg Knee Complete 4 Views Left  02/13/2013   *RADIOLOGY REPORT*  Clinical Data: Left knee pain  LEFT KNEE - COMPLETE 4+ VIEW  Comparison: None.  Findings:  Osteopenia without definite fracture.  Post replacement of the medial compartment of the right knee.  The caudal aspect of the intramedullary femoral rod is also noted.  No evidence of hardware  failure or loosening.  There is a minimal amount of chondrocalcinosis within the remaining lateral joint space.  No joint effusion.  Vascular calcifications.  IMPRESSION:  1.  Osteopenia without definite fracture or dislocation. 2.  Post intramedullary rod fixation and replacement of the medial compartment of the left knee without evidence of hardware failure or loosening.  3.  Chondrocalcinosis suggestive of CPPD.   Original Report Authenticated By: Tacey Ruiz, MD    Medications:  I have reviewed the patient's current medications. Scheduled: . aspirin EC  81 mg Oral Daily  . clopidogrel  75 mg Oral Daily  . lisinopril  10 mg Oral Daily  . methimazole  5 mg Oral 3 times weekly  . simvastatin  40 mg Oral QPM  . sodium chloride  3 mL Intravenous Q12H  . spironolactone  25 mg Oral Daily  . vitamin C  500 mg Oral Daily    Assessment/Plan: 79 year old with difficulty with gait.  No weakness noted.  Patient does have some increased tone though and with initial attempts at gait seems to have some difficulty figuring out how to use her legs.  The left foot seems magnetic at times.  Patient without tremor but  may very well have a Parkinsonian variant.  NPH may be considered as well but symptoms not static.  Head CT reviewed and shows no acute changes.  MRI unable to be performed.  Recommendations: 1.  PT consult 2.  Orthostatic BP with heart rate   LOS: 1 day   Thana Farr, MD [facility] [phone] 02/14/2013  3:02 PM

## 2013-02-14 NOTE — Evaluation (Signed)
Physical Therapy Evaluation Patient Details Name: [name] MRN: [id number] DOB: 01/19/1932 Today's Date: 02/14/2013 Time: 4098-1191 PT Time Calculation (min): 28 min  PT Assessment / Plan / Recommendation History of Present Illness  Adm for lower extremity weakness, difficulty with gait  Clinical Impression  Presents to PT with below impairments impacting functional independence and safety with mobility. Initially not  interested in participating in therapy evaluation because she is "too tired." Agreeable to basic evaluation. Lower extremities weaker, possibly R weaker than L, although patient reports left feels weaker since her TKA. She needed modA to stand and wasn't able to stand for longer than one minute before fatiguing. Per RN it took two people to assist her to the bathroom, I did not observe ambulation because I did not have another person present. Per patient report she has progressively been getting weaker over the past few weeks. Her husband is limited physically in his ability to care for her and her son works during the day and is home at night. Ideally she would require post-acute rehab to maximize strength and mobility. Given her weakness she is at high risk for falls. Noted she is an observation patient and that SNF is likely not an option, would she qualify for CIR? Spoke with CM about this. If she does inevitably go home she will likely need to go home with a w/c for longer distances, 3in1 and HHPT, OT and aide. I have also requested an OT consult. Will follow acute to maximize mobility for safe d/c.     PT Assessment  Patient needs continued PT services    Follow Up Recommendations  CIR (if she does not qualify she will require HHPT, OT, 24 hour assist (physical assist), 3in1)    Does the patient have the potential to tolerate intense rehabilitation      Barriers to Discharge        Equipment Recommendations  Wheelchair (measurements PT);Wheelchair cushion  (measurements PT);3in1 (PT)    Recommendations for Other Services     Frequency Min 4X/week    Precautions / Restrictions Precautions Precautions: Fall Restrictions Weight Bearing Restrictions: No   Pertinent Vitals/Pain Denies pain, just very fatigued      Mobility  Bed Mobility Bed Mobility: Supine to Sit;Sit to Supine Supine to Sit: 4: Min assist;With rails Sit to Supine: 4: Min assist Details for Bed Mobility Assistance: needed facilitation for follow through elevating trunk from bed, increased effort noted on her part; assist to elevate legs and reposition Transfers Transfers: Sit to Stand;Stand to Sit Sit to Stand: 3: Mod assist;With upper extremity assist;From bed Stand to Sit: 3: Mod assist;With upper extremity assist;To bed Details for Transfer Assistance: facilitation for anterior translation of trunk over BOS and stabilize as she stood, needed upper extremity support by holding onto chair in standing to stabilize due to lower extremity weakness, very slow to rise Ambulation/Gait Ambulation/Gait Assistance: Not tested (comment) Modified Rankin (Stroke Patients Only) Pre-Morbid Rankin Score: No symptoms Modified Rankin: Moderately severe disability    Exercises     PT Diagnosis: Generalized weakness;Abnormality of gait;Difficulty walking  PT Problem List: Decreased strength;Decreased activity tolerance;Decreased balance;Decreased mobility;Decreased knowledge of use of DME PT Treatment Interventions: DME instruction;Gait training;Functional mobility training;Therapeutic activities;Therapeutic exercise;Balance training;Neuromuscular re-education;Cognitive remediation;Patient/family education     PT Goals(Current goals can be found in the care plan section) Acute Rehab PT Goals Patient Stated Goal: stronger PT Goal Formulation: With patient Time For Goal Achievement: 02/21/13 Potential to Achieve Goals: Good  Visit Information  Last PT Received On:  02/14/13 Assistance Needed: +2 History of Present Illness: Adm for lower extremity weakness, difficulty with gait       Prior Functioning  Home Living Family/patient expects to be discharged to:: Private residence Living Arrangements: Spouse/significant other Available Help at Discharge: Family Type of Home: House Home Access: Level entry Home Layout: One level Home Equipment: Environmental consultant - 2 wheels Prior Function Level of Independence: Independent with assistive device(s) Comments: husband is on oxygen, son lives next door but works during the day so doesn't have physical help at home Communication Communication: Prefers language other than English    Cognition  Cognition Arousal/Alertness: Awake/alert Behavior During Therapy: WFL for tasks assessed/performed Overall Cognitive Status: Difficult to assess Difficult to assess due to: Non-English speaking    Extremity/Trunk Assessment Upper Extremity Assessment Upper Extremity Assessment: Defer to OT evaluation Lower Extremity Assessment Lower Extremity Assessment: Generalized weakness;RLE deficits/detail;LLE deficits/detail RLE Deficits / Details: initially 4/5 contraction however unable to sustain contraction for longer than a few seconds and contraction begins to break; unable to determine if this is because her leg is weak or that she didn't understand what I was asking her to do LLE Deficits / Details: grossly 76/5; old TKA, pt reports this leg is weak because of her surgery   Balance Balance Balance Assessed: Yes Static Standing Balance Static Standing - Balance Support: Bilateral upper extremity supported Static Standing - Level of Assistance: 4: Min assist Static Standing - Comment/# of Minutes: min stability assist, decreased tolerance for standing, only stood 1 minute prior to sitting  End of Session PT - End of Session Equipment Utilized During Treatment: Gait belt Activity Tolerance: Patient tolerated treatment  well Patient left: in bed;with call bell/phone within reach Nurse Communication: Mobility status  GP Functional Limitation: Mobility: Walking and moving around Mobility: Walking and Moving Around Current Status ([id number]): At least 40 percent but less than 60 percent impaired, limited or restricted Mobility: Walking and Moving Around Goal Status [phone]): At least 1 percent but less than 20 percent impaired, limited or restricted   Ludger Nutting 02/14/2013, 4:02 PM

## 2013-02-14 NOTE — Progress Notes (Signed)
[facility] Progress note l  [name] BJY:[id number] DOB: 1932-06-02 DOA: 02/13/2013  Referring physician: Renae Fickle personnel Dahlia Client Muthersbaugh PCP: Oneal Grout, MD  Specialists: None  Chief Complaint: weakness  HPI: [name] is a 79 y.o. WF PMHx HX CVA, Hyperthyroidism, atrial fibrillation, HTN, CAD, CHF, HLD pacemaker-[employer], HX CVA, memory loss, generalized weakness, Presented to the ED complaining of weakness that occurred today.  The history is obtained from EMR as patient speaks [place] and no family member at bedside at this moment.  Reportedly patient stated that the weakness was gradual but son reports that it occurred today.  The problem has been persistent since onset and reportedly gradually getting worse. TODAY (patient understands more English than she can speak) negative pain, positive continued fatigue, positive weakness  Procedure  CT head without contrast 02/13/2013 1. No acute intracranial abnormality.  2. Stable moderate to severe generalized atrophy and severe  chronic microvascular ischemic changes of the white matter   Echocardiogram 09/22/2011 - Left ventricle: Technically difficult study. The inferior septum has an unusual shape.  -There may be aneurysmal dilitation at the base of the inferolateral wall.  LVEF =55% to 60%. - Aortic valve: Mildly calcified leaflets. There was very mild stenosis. Mean gradient: 13mm Hg (S). Peak gradient: 25mm Hg (S). Valve area: 1.25cm^2(VTI). Valve area: 1.2cm^2 (Vmax). - Mitral valve: Mild regurgitation. - Left atrium: The atrium was mildly dilated.  Brain MRI without contrast 10/13/2009  1. Acute or subacute punctate probable small vessel infarct in the  left superior frontal gyrus subcortical white matter. No mass  effect or hemorrhage.  2. Otherwise stable very advanced small vessel ischemic and  microhemorrhagic changes      Past Medical History  Diagnosis Date  . Lumbar back pain   .  Other urinary incontinence   . Memory loss   . DJD (degenerative joint disease)   . Depression   . Hypertension   . CVA (cerebral infarction)   . CHF (congestive heart failure)     Chronic Diastolic  . CAD (coronary artery disease)   . Presence of permanent cardiac pacemaker   . Osteoporosis   . Hypercholesterolemia   . Hyperthyroidism   . Anxiety   . Vertigo   . Tachy-brady syndrome   . Ischemic cardiomyopathy    Past Surgical History  Procedure Laterality Date  . Pacemaker insertion  2011  . Total knee arthroplasty  2002    bilateral  . Abdominal hysterectomy  1986  . Bilateral stenting  1999    to the RCA  . Cardiac catheterization  05/23/10  . Total abdominal hysterectomy w/ bilateral salpingoophorectomy     Social History:  reports that she has never smoked. She does not have any smokeless tobacco history on file. She reports that she does not drink alcohol or use illicit drugs.   Allergies  Allergen Reactions  . Iodine Other (See Comments)    unknown  . Iohexol      Code: HIVES, Desc: hives with nonionic cotrast 7 yrs ago ([place] iv benedryl given   . Peanut-Containing Drug Products     Family History  Problem Relation Age of Onset  . Thyroid disease Daughter   . Diabetes Other   . Hypertension Other   . Arthritis Other   . Coronary artery disease Mother 53  . Coronary artery disease Father 47  None other reported  Prior to Admission medications   Medication Sig Start Date End Date Taking? Authorizing Provider  alendronate (  FOSAMAX) 70 MG tablet Take 70 mg by mouth every 7 (seven) days. Take in the morning with a full glass of water, on an empty stomach, and do not take anything else by mouth or lie down for the next 30 min. Takes on Fridays   Yes Historical Provider, MD  Ascorbic Acid (VITAMIN C) 500 MG tablet Take 500 mg by mouth daily.     Yes Historical Provider, MD  aspirin EC 81 MG tablet Take 81 mg by mouth daily.   Yes Historical Provider, MD   Calcium Carbonate-Vit D-Min 600-400 MG-UNIT TABS Take by mouth 2 (two) times daily.     Yes Historical Provider, MD  clopidogrel (PLAVIX) 75 MG tablet Take 75 mg by mouth daily.   Yes Historical Provider, MD  furosemide (LASIX) 40 MG tablet Take one tablet once a day for edema 09/09/12  Yes Tiffany L Reed, DO  lisinopril (PRINIVIL,ZESTRIL) 10 MG tablet Take 10 mg by mouth daily.   Yes Historical Provider, MD  methimazole (TAPAZOLE) 5 MG tablet Take 5 mg by mouth 3 (three) times a week.   Yes Historical Provider, MD  Multiple Vitamins-Minerals (CENTRUM SILVER) tablet Take 1 tablet by mouth daily.     Yes Historical Provider, MD  nitroGLYCERIN (NITROSTAT) 0.4 MG SL tablet Place 0.4 mg under the tongue every 5 (five) minutes as needed. For chest pain    Yes Historical Provider, MD  polyethylene glycol (GLYCOLAX) powder Take 17 g by mouth daily as needed. constipation   Yes Historical Provider, MD  simvastatin (ZOCOR) 40 MG tablet Take 40 mg by mouth every evening.   Yes Historical Provider, MD  spironolactone (ALDACTONE) 25 MG tablet Take 25 mg by mouth daily.   Yes Historical Provider, MD   Physical Exam: Filed Vitals:   02/14/13 0606  BP: 110/73  Pulse: 68  Temp: 98.1 F (36.7 C)  Resp: 18     General:  Alert , NAD, follow all commands   Eyes: EOMI, non icteric  Cardiovascular: RRR, no MRG  Respiratory: CTA BL, no wheezes  Abdomen: soft, obese, NT  Skin: warm and dry, pacer present in the left upper chest wall  Musculoskeletal: no cyanosis or clubbing  Neurologic: Pupils equal reactive to light and accommodation, cranial nerves II through XII are intact, right upper extremity strength 4/5 (patient stated chronic), left upper extremity 5/5, bilateral lower extremity 4/5, sensation intact throughout, bilateral knee reflex is diminished secondary to bilateral TKA, tongue is center line negative facial droop  Labs on Admission:  Basic Metabolic Panel:  Recent Labs Lab  02/13/13 2002 02/14/13 0500  NA 139 140  K 4.0 4.0  CL 98 101  CO2 32 29  GLUCOSE 90 111*  BUN 21 19  CREATININE 0.94 0.79  CALCIUM 10.1 9.7  MG  --  2.3  PHOS  --  4.1   Liver Function Tests: No results found for this basename: AST, ALT, ALKPHOS, BILITOT, PROT, ALBUMIN,  in the last 168 hours No results found for this basename: LIPASE, AMYLASE,  in the last 168 hours No results found for this basename: AMMONIA,  in the last 168 hours CBC:  Recent Labs Lab 02/13/13 2002 02/14/13 0500  WBC 9.3 8.8  HGB 14.0 13.8  HCT 41.5 40.2  MCV 89.6 89.5  PLT 240 240   Cardiac Enzymes:  Recent Labs Lab 02/13/13 2002  TROPONINI <0.30    BNP (last 3 results) No results found for this basename: PROBNP,  in the last  8760 hours CBG: No results found for this basename: GLUCAP,  in the last 168 hours  Radiological Exams on Admission: Dg Chest 2 View  02/13/2013   *RADIOLOGY REPORT*  Clinical Data: Shortness of breath.  Indwelling pacemaker.  CHEST - 2 VIEW  Comparison: Two-view chest x-ray 09/21/2011, 06/21/2011, 10/12/2009.  Findings: Suboptimal inspiration due to body habitus which accounts for crowded bronchovascular markings at the bases and accentuates the cardiac silhouette.  Taking this into account, cardiac silhouette mildly to moderately enlarged but stable.  Left subclavian dual lead transvenous pacemaker leads project over the expected location of the right atrial appendage and right ventricular apex.  Thoracic aorta tortuous and atherosclerotic, unchanged.  Hilar and mediastinal contours otherwise unremarkable. Minimal linear atelectasis in the right middle lobe.  Lungs otherwise clear.  Bronchovascular markings normal.  Pulmonary vascularity normal.  No pleural effusions.  No pneumothorax. Degenerative changes throughout the thoracic spine with exaggeration of the usual thoracic kyphosis.  IMPRESSION: Suboptimal inspiration with atelectasis in the right middle lobe. No acute  cardiopulmonary disease otherwise.  Stable cardiomegaly.   Original Report Authenticated By: Hulan Saas, M.D.   Ct Head Wo Contrast  02/13/2013   *RADIOLOGY REPORT*  Clinical Data: Patient fell onto her left side while walking to the bathroom.  Prior history of stroke.  CT HEAD WITHOUT CONTRAST  Technique:  Contiguous axial images were obtained from the base of the skull through the vertex without contrast.  Comparison: CT head 09/21/2011, 09/20/2011, 10/12/2009.  MRI brain 10/13/2009.  Findings: Moderate to severe cortical atrophy, moderate deep atrophy, and severe changes of small vessel disease of the white matter diffusely, unchanged.  No mass lesion.  No midline shift. No acute hemorrhage or hematoma.  No extra-axial fluid collections. No evidence of acute infarction.  No significant interval change since 2011.  No skull fracture or other focal osseous abnormality involving the skull.  Visualized paranasal sinuses, bilateral mastoid air cells, and bilateral middle ear cavities well-aerated.  Bilateral carotid siphon and vertebral artery atherosclerosis.  IMPRESSION:  1.  No acute intracranial abnormality. 2.  Stable moderate to severe generalized atrophy and severe chronic microvascular ischemic changes of the white matter.   Original Report Authenticated By: Hulan Saas, M.D.   Dg Knee Complete 4 Views Left  02/13/2013   *RADIOLOGY REPORT*  Clinical Data: Left knee pain  LEFT KNEE - COMPLETE 4+ VIEW  Comparison: None.  Findings:  Osteopenia without definite fracture.  Post replacement of the medial compartment of the right knee.  The caudal aspect of the intramedullary femoral rod is also noted.  No evidence of hardware failure or loosening.  There is a minimal amount of chondrocalcinosis within the remaining lateral joint space.  No joint effusion.  Vascular calcifications.  IMPRESSION:  1.  Osteopenia without definite fracture or dislocation. 2.  Post intramedullary rod fixation and  replacement of the medial compartment of the left knee without evidence of hardware failure or loosening.  3.  Chondrocalcinosis suggestive of CPPD.   Original Report Authenticated By: Tacey Ruiz, MD    EKG: Independently reviewed. Sinus rhythm with no ST elevation or depression  Assessment/Plan Active Problems:   1. Generalized weakness - TSH borderline level for some therapeutic hypothyroidism we'll obtain free T4 - consult dietitian to assess for malnutrition as a contributing factor - Neurology on board - CT of head 24 hours from last as patient has pacemaker and can't have MRI of head scheduled for CT scan on 02/15/2013 - Physical therapy  evaluation; recommends CIR vs SNF - Vitamin b12 and folate levels - Continue to hold lasix; negative signs of fluid overload and renal function improving  - CT of head reported as no acute intracranial abnormality  2. CHF, suspect diastolic HF given normal EF -- obtain new echocardiogram - Continue home regimen - hold lasix as bun/creatinine ratio up and patient appears compensated.  3. HTN - stable continue home regimen  4. Hyperthyroidism - continue home regimen; upon return of free T4 will consider DC regimen see #1   5. DVT prophylaxis - SCD's while laying in bed.   Code Status: presumed full Family Communication: no family at bedside. Disposition Plan: Pending further work up  Time spent: 40 minutes  [name] [facility] Pager [phone]  If 7PM-7AM, please contact night-coverage [email or web address] Password [facility] 02/14/2013, 7:27 AM

## 2013-02-14 NOTE — H&P (Signed)
[facility] History and Physical  TRICHELLE LEHAN RUE:[id number] DOB: Dec 05, 1931 DOA: 02/13/2013  Referring physician: Renae Fickle personnel Dahlia Client Muthersbaugh PCP: Oneal Grout, MD  Specialists: None  Chief Complaint: weakness  HPI: [name] is a 79 y.o. female  With PMH as listed below.  Is presenting to the ED complaining of weakness that occurred today.  The history is obtained from EMR as patient speaks [place] and no family member at bedside at this moment.  Reportedly patient stated that the weakness was gradual but son reports that it occurred today.  The problem has been persistent since onset and reportedly gradually getting worse.    In the ED patient was evaluated by neurology who recommended further evaluation.  We were consulted for further evaluation and recommendations for progressive weakness  Review of Systems: Unable to review due to language barrier  Past Medical History  Diagnosis Date  . Lumbar back pain   . Other urinary incontinence   . Memory loss   . DJD (degenerative joint disease)   . Depression   . Hypertension   . CVA (cerebral infarction)   . CHF (congestive heart failure)     Chronic Diastolic  . CAD (coronary artery disease)   . Presence of permanent cardiac pacemaker   . Osteoporosis   . Hypercholesterolemia   . Hyperthyroidism   . Anxiety   . Vertigo   . Tachy-brady syndrome   . Ischemic cardiomyopathy    Past Surgical History  Procedure Laterality Date  . Pacemaker insertion  2011  . Total knee arthroplasty  2002    bilateral  . Abdominal hysterectomy  1986  . Bilateral stenting  1999    to the RCA  . Cardiac catheterization  05/23/10  . Total abdominal hysterectomy w/ bilateral salpingoophorectomy     Social History:  reports that she has never smoked. She does not have any smokeless tobacco history on file. She reports that she does not drink alcohol or use illicit drugs.   Allergies  Allergen Reactions  . Iodine  Other (See Comments)    unknown  . Iohexol      Code: HIVES, Desc: hives with nonionic cotrast 7 yrs ago ([place] iv benedryl given   . Peanut-Containing Drug Products     Family History  Problem Relation Age of Onset  . Thyroid disease Daughter   . Diabetes Other   . Hypertension Other   . Arthritis Other   . Coronary artery disease Mother 64  . Coronary artery disease Father 66  None other reported  Prior to Admission medications   Medication Sig Start Date End Date Taking? Authorizing Provider  alendronate (FOSAMAX) 70 MG tablet Take 70 mg by mouth every 7 (seven) days. Take in the morning with a full glass of water, on an empty stomach, and do not take anything else by mouth or lie down for the next 30 min. Takes on Fridays   Yes Historical Provider, MD  Ascorbic Acid (VITAMIN C) 500 MG tablet Take 500 mg by mouth daily.     Yes Historical Provider, MD  aspirin EC 81 MG tablet Take 81 mg by mouth daily.   Yes Historical Provider, MD  Calcium Carbonate-Vit D-Min 600-400 MG-UNIT TABS Take by mouth 2 (two) times daily.     Yes Historical Provider, MD  clopidogrel (PLAVIX) 75 MG tablet Take 75 mg by mouth daily.   Yes Historical Provider, MD  furosemide (LASIX) 40 MG tablet Take one tablet once a day  for edema 09/09/12  Yes Tiffany L Reed, DO  lisinopril (PRINIVIL,ZESTRIL) 10 MG tablet Take 10 mg by mouth daily.   Yes Historical Provider, MD  methimazole (TAPAZOLE) 5 MG tablet Take 5 mg by mouth 3 (three) times a week.   Yes Historical Provider, MD  Multiple Vitamins-Minerals (CENTRUM SILVER) tablet Take 1 tablet by mouth daily.     Yes Historical Provider, MD  nitroGLYCERIN (NITROSTAT) 0.4 MG SL tablet Place 0.4 mg under the tongue every 5 (five) minutes as needed. For chest pain    Yes Historical Provider, MD  polyethylene glycol (GLYCOLAX) powder Take 17 g by mouth daily as needed. constipation   Yes Historical Provider, MD  simvastatin (ZOCOR) 40 MG tablet Take 40 mg by mouth every  evening.   Yes Historical Provider, MD  spironolactone (ALDACTONE) 25 MG tablet Take 25 mg by mouth daily.   Yes Historical Provider, MD   Physical Exam: Filed Vitals:   02/13/13 2112  BP: 113/82  Pulse: 85  Temp: 98.5 F (36.9 C)  Resp: 17     General:  Pt in NAD, Alert and awake. Smiling at times  Eyes: EOMI, non icteric  ENT: normal exterior appearance, dry mucous membranes  Neck: supple, no goiter  Cardiovascular: RRR, no MRG  Respiratory: CTA BL, no wheezes  Abdomen: soft, obese, NT  Skin: warm and dry  Musculoskeletal: no cyanosis or clubbing  Psychiatric: Unable to properly assess due to language barrier  Neurologic: responds to questions, can raise both legs equally, no facial asymmetry  Labs on Admission:  Basic Metabolic Panel:  Recent Labs Lab 02/13/13 2002  NA 139  K 4.0  CL 98  CO2 32  GLUCOSE 90  BUN 21  CREATININE 0.94  CALCIUM 10.1   Liver Function Tests: No results found for this basename: AST, ALT, ALKPHOS, BILITOT, PROT, ALBUMIN,  in the last 168 hours No results found for this basename: LIPASE, AMYLASE,  in the last 168 hours No results found for this basename: AMMONIA,  in the last 168 hours CBC:  Recent Labs Lab 02/13/13 2002  WBC 9.3  HGB 14.0  HCT 41.5  MCV 89.6  PLT 240   Cardiac Enzymes:  Recent Labs Lab 02/13/13 2002  TROPONINI <0.30    BNP (last 3 results) No results found for this basename: PROBNP,  in the last 8760 hours CBG: No results found for this basename: GLUCAP,  in the last 168 hours  Radiological Exams on Admission: Dg Chest 2 View  02/13/2013   *RADIOLOGY REPORT*  Clinical Data: Shortness of breath.  Indwelling pacemaker.  CHEST - 2 VIEW  Comparison: Two-view chest x-ray 09/21/2011, 06/21/2011, 10/12/2009.  Findings: Suboptimal inspiration due to body habitus which accounts for crowded bronchovascular markings at the bases and accentuates the cardiac silhouette.  Taking this into account, cardiac  silhouette mildly to moderately enlarged but stable.  Left subclavian dual lead transvenous pacemaker leads project over the expected location of the right atrial appendage and right ventricular apex.  Thoracic aorta tortuous and atherosclerotic, unchanged.  Hilar and mediastinal contours otherwise unremarkable. Minimal linear atelectasis in the right middle lobe.  Lungs otherwise clear.  Bronchovascular markings normal.  Pulmonary vascularity normal.  No pleural effusions.  No pneumothorax. Degenerative changes throughout the thoracic spine with exaggeration of the usual thoracic kyphosis.  IMPRESSION: Suboptimal inspiration with atelectasis in the right middle lobe. No acute cardiopulmonary disease otherwise.  Stable cardiomegaly.   Original Report Authenticated By: Hulan Saas, M.D.  Ct Head Wo Contrast  02/13/2013   *RADIOLOGY REPORT*  Clinical Data: Patient fell onto her left side while walking to the bathroom.  Prior history of stroke.  CT HEAD WITHOUT CONTRAST  Technique:  Contiguous axial images were obtained from the base of the skull through the vertex without contrast.  Comparison: CT head 09/21/2011, 09/20/2011, 10/12/2009.  MRI brain 10/13/2009.  Findings: Moderate to severe cortical atrophy, moderate deep atrophy, and severe changes of small vessel disease of the white matter diffusely, unchanged.  No mass lesion.  No midline shift. No acute hemorrhage or hematoma.  No extra-axial fluid collections. No evidence of acute infarction.  No significant interval change since 2011.  No skull fracture or other focal osseous abnormality involving the skull.  Visualized paranasal sinuses, bilateral mastoid air cells, and bilateral middle ear cavities well-aerated.  Bilateral carotid siphon and vertebral artery atherosclerosis.  IMPRESSION:  1.  No acute intracranial abnormality. 2.  Stable moderate to severe generalized atrophy and severe chronic microvascular ischemic changes of the white matter.    Original Report Authenticated By: Hulan Saas, M.D.   Dg Knee Complete 4 Views Left  02/13/2013   *RADIOLOGY REPORT*  Clinical Data: Left knee pain  LEFT KNEE - COMPLETE 4+ VIEW  Comparison: None.  Findings:  Osteopenia without definite fracture.  Post replacement of the medial compartment of the right knee.  The caudal aspect of the intramedullary femoral rod is also noted.  No evidence of hardware failure or loosening.  There is a minimal amount of chondrocalcinosis within the remaining lateral joint space.  No joint effusion.  Vascular calcifications.  IMPRESSION:  1.  Osteopenia without definite fracture or dislocation. 2.  Post intramedullary rod fixation and replacement of the medial compartment of the left knee without evidence of hardware failure or loosening.  3.  Chondrocalcinosis suggestive of CPPD.   Original Report Authenticated By: Tacey Ruiz, MD    EKG: Independently reviewed. Sinus rhythm with no ST elevation or depression  Assessment/Plan Active Problems:   1. Generalized weakness - will assess TSH - consult dietitian to assess for malnutrition as a contributing factor - Neurology on board - CT of head 24 hours from last as patient has pacemaker and can't have MRI of head - Physical therapy evaluation - Vitamin b12 and folate levels - hold lasix as BUN/Creatinine ratio is elevated, dehydration may be playing a role - CT of head reported as no acute intracranial abnormality  2. CHF, suspect diastolic HF given normal EF - Continue home regimen - hold lasix as bun/creatinine ratio up and patient appears compensated.  3. HTN - stable continue home regimen  4. Hyperthyroidism - continue home regimen - recheck TSH levels.  5. DVT prophylaxis - SCD's while laying in bed.   Code Status: presumed full Family Communication: no family at bedside. Disposition Plan: Pending further work up  Time spent: > 65 minutes  Penny Pia [facility] Pager  [phone]  If 7PM-7AM, please contact night-coverage [email or web address] Password [email or web address] 02/14/2013, 1:08 AM

## 2013-02-15 ENCOUNTER — Observation Stay (HOSPITAL_COMMUNITY): Payer: Medicare Other

## 2013-02-15 DIAGNOSIS — Z950 Presence of cardiac pacemaker: Secondary | ICD-10-CM

## 2013-02-15 DIAGNOSIS — M6281 Muscle weakness (generalized): Secondary | ICD-10-CM

## 2013-02-15 DIAGNOSIS — E0590 Thyrotoxicosis, unspecified without thyrotoxic crisis or storm: Secondary | ICD-10-CM

## 2013-02-15 DIAGNOSIS — I10 Essential (primary) hypertension: Secondary | ICD-10-CM

## 2013-02-15 LAB — T4, FREE: Free T4: 1.13 ng/dL (ref 0.80–1.80)

## 2013-02-15 NOTE — Progress Notes (Signed)
Pt is Medicare and is observation status.  CSW unable to place pt in a SNF under her Medicare benefit without a 3 night qualifying stay as an IP.  CSW can f/u with pt to see if private pay SNF would be an option for her.

## 2013-02-15 NOTE — Progress Notes (Signed)
[facility] Progress note l  [name] UJW:[id number] DOB: 21-May-1932 DOA: 02/13/2013  Referring physician: Renae Fickle personnel Dahlia Client Muthersbaugh PCP: Oneal Grout, MD  Specialists: None  Chief Complaint: weakness  HPI: [name] is a 79 y.o. WF PMHx HX CVA, Hyperthyroidism, atrial fibrillation, HTN, CAD, CHF, HLD pacemaker-[employer], HX CVA, memory loss, generalized weakness, Presented to the ED complaining of weakness that occurred today.  The history is obtained from EMR as patient speaks [place] and no family member at bedside at this moment.  Reportedly patient stated that the weakness was gradual but son reports that it occurred today.  The problem has been persistent since onset and reportedly gradually getting worse. TODAY (patient understands more English than she can speak) negative pain, positive continued fatigue, positive weakness  Procedure Head CT without contrast 02/15/2013 Brain: No evidence of acute abnormality, such as acute infarction,  hemorrhage, hydrocephalus, or mass lesion/mass effect.Established,  remote appearing, infarcts in the left cerebellum, high anterior  left frontal lobe, and inferior/medial right putamen, and left  caudate head. Extensive chronic small vessel ischemic injury with  confluent bilateral cerebral white matter low attenuation.  IMPRESSION:  1. No evidence of acute infarct.  2. Extensive chronic small vessel ischemic injury.    CT head without contrast 02/13/2013 1. No acute intracranial abnormality.  2. Stable moderate to severe generalized atrophy and severe  chronic microvascular ischemic changes of the white matter   Echocardiogram 09/22/2011 - Left ventricle: Technically difficult study. The inferior septum has an unusual shape.  -There may be aneurysmal dilitation at the base of the inferolateral wall.  LVEF =55% to 60%. - Aortic valve: Mildly calcified leaflets. There was very mild stenosis. Mean gradient: 13mm Hg  (S). Peak gradient: 25mm Hg (S). Valve area: 1.25cm^2(VTI). Valve area: 1.2cm^2 (Vmax). - Mitral valve: Mild regurgitation. - Left atrium: The atrium was mildly dilated.  Brain MRI without contrast 10/13/2009  1. Acute or subacute punctate probable small vessel infarct in the  left superior frontal gyrus subcortical white matter. No mass  effect or hemorrhage.  2. Otherwise stable very advanced small vessel ischemic and  microhemorrhagic changes      Past Medical History  Diagnosis Date  . Lumbar back pain   . Other urinary incontinence   . Memory loss   . DJD (degenerative joint disease)   . Depression   . Hypertension   . CVA (cerebral infarction)   . CHF (congestive heart failure)     Chronic Diastolic  . CAD (coronary artery disease)   . Presence of permanent cardiac pacemaker   . Osteoporosis   . Hypercholesterolemia   . Hyperthyroidism   . Anxiety   . Vertigo   . Tachy-brady syndrome   . Ischemic cardiomyopathy    Past Surgical History  Procedure Laterality Date  . Pacemaker insertion  2011  . Total knee arthroplasty  2002    bilateral  . Abdominal hysterectomy  1986  . Bilateral stenting  1999    to the RCA  . Cardiac catheterization  05/23/10  . Total abdominal hysterectomy w/ bilateral salpingoophorectomy     Social History:  reports that she has never smoked. She does not have any smokeless tobacco history on file. She reports that she does not drink alcohol or use illicit drugs.   Allergies  Allergen Reactions  . Iodine Other (See Comments)    unknown  . Iohexol      Code: HIVES, Desc: hives with nonionic cotrast 7 yrs ago (  [place] iv benedryl given   . Peanut-Containing Drug Products     Family History  Problem Relation Age of Onset  . Thyroid disease Daughter   . Diabetes Other   . Hypertension Other   . Arthritis Other   . Coronary artery disease Mother 30  . Coronary artery disease Father 32  None other reported  Prior to Admission  medications   Medication Sig Start Date End Date Taking? Authorizing Provider  alendronate (FOSAMAX) 70 MG tablet Take 70 mg by mouth every 7 (seven) days. Take in the morning with a full glass of water, on an empty stomach, and do not take anything else by mouth or lie down for the next 30 min. Takes on Fridays   Yes Historical Provider, MD  Ascorbic Acid (VITAMIN C) 500 MG tablet Take 500 mg by mouth daily.     Yes Historical Provider, MD  aspirin EC 81 MG tablet Take 81 mg by mouth daily.   Yes Historical Provider, MD  Calcium Carbonate-Vit D-Min 600-400 MG-UNIT TABS Take by mouth 2 (two) times daily.     Yes Historical Provider, MD  clopidogrel (PLAVIX) 75 MG tablet Take 75 mg by mouth daily.   Yes Historical Provider, MD  furosemide (LASIX) 40 MG tablet Take one tablet once a day for edema 09/09/12  Yes Tiffany L Reed, DO  lisinopril (PRINIVIL,ZESTRIL) 10 MG tablet Take 10 mg by mouth daily.   Yes Historical Provider, MD  methimazole (TAPAZOLE) 5 MG tablet Take 5 mg by mouth 3 (three) times a week.   Yes Historical Provider, MD  Multiple Vitamins-Minerals (CENTRUM SILVER) tablet Take 1 tablet by mouth daily.     Yes Historical Provider, MD  nitroGLYCERIN (NITROSTAT) 0.4 MG SL tablet Place 0.4 mg under the tongue every 5 (five) minutes as needed. For chest pain    Yes Historical Provider, MD  polyethylene glycol (GLYCOLAX) powder Take 17 g by mouth daily as needed. constipation   Yes Historical Provider, MD  simvastatin (ZOCOR) 40 MG tablet Take 40 mg by mouth every evening.   Yes Historical Provider, MD  spironolactone (ALDACTONE) 25 MG tablet Take 25 mg by mouth daily.   Yes Historical Provider, MD   Physical Exam: Filed Vitals:   02/15/13 0544  BP: 134/82  Pulse: 73  Temp: 98 F (36.7 C)  Resp: 18     General:  Alert , NAD, follow all commands   Eyes: EOMI, non icteric  Cardiovascular: RRR, no MRG  Respiratory: CTA BL, no wheezes  Abdomen: soft, obese, NT  Skin: warm and  dry, pacer present in the left upper chest wall  Musculoskeletal: no cyanosis or clubbing  Neurologic: Pupils equal reactive to light and accommodation, cranial nerves II through XII are intact, right upper extremity strength 4/5 (patient stated chronic), left upper extremity 5/5, bilateral lower extremity 4/5, sensation intact throughout, bilateral knee reflex is diminished secondary to bilateral TKA, tongue is center line negative facial droop  Labs on Admission:  Basic Metabolic Panel:  Recent Labs Lab 02/13/13 2002 02/14/13 0500  NA 139 140  K 4.0 4.0  CL 98 101  CO2 32 29  GLUCOSE 90 111*  BUN 21 19  CREATININE 0.94 0.79  CALCIUM 10.1 9.7  MG  --  2.3  PHOS  --  4.1   Liver Function Tests: No results found for this basename: AST, ALT, ALKPHOS, BILITOT, PROT, ALBUMIN,  in the last 168 hours No results found for this basename: LIPASE, AMYLASE,  in the last 168 hours No results found for this basename: AMMONIA,  in the last 168 hours CBC:  Recent Labs Lab 02/13/13 2002 02/14/13 0500  WBC 9.3 8.8  HGB 14.0 13.8  HCT 41.5 40.2  MCV 89.6 89.5  PLT 240 240   Cardiac Enzymes:  Recent Labs Lab 02/13/13 2002  TROPONINI <0.30    BNP (last 3 results) No results found for this basename: PROBNP,  in the last 8760 hours CBG: No results found for this basename: GLUCAP,  in the last 168 hours  Radiological Exams on Admission: Dg Chest 2 View  02/13/2013   *RADIOLOGY REPORT*  Clinical Data: Shortness of breath.  Indwelling pacemaker.  CHEST - 2 VIEW  Comparison: Two-view chest x-ray 09/21/2011, 06/21/2011, 10/12/2009.  Findings: Suboptimal inspiration due to body habitus which accounts for crowded bronchovascular markings at the bases and accentuates the cardiac silhouette.  Taking this into account, cardiac silhouette mildly to moderately enlarged but stable.  Left subclavian dual lead transvenous pacemaker leads project over the expected location of the right atrial  appendage and right ventricular apex.  Thoracic aorta tortuous and atherosclerotic, unchanged.  Hilar and mediastinal contours otherwise unremarkable. Minimal linear atelectasis in the right middle lobe.  Lungs otherwise clear.  Bronchovascular markings normal.  Pulmonary vascularity normal.  No pleural effusions.  No pneumothorax. Degenerative changes throughout the thoracic spine with exaggeration of the usual thoracic kyphosis.  IMPRESSION: Suboptimal inspiration with atelectasis in the right middle lobe. No acute cardiopulmonary disease otherwise.  Stable cardiomegaly.   Original Report Authenticated By: Hulan Saas, M.D.   Ct Head Wo Contrast  02/15/2013   *RADIOLOGY REPORT*  Clinical Data: Generalized weakness.  Evaluate for evolving infarcts.  CT HEAD WITHOUT CONTRAST  Technique:  Contiguous axial images were obtained from the base of the skull through the vertex without contrast.  Comparison: 02/13/2013.  Findings:  Skull:No acute osseous abnormality.  Orbits: No acute abnormality.  Brain: No evidence of acute abnormality, such as acute infarction, hemorrhage, hydrocephalus, or mass lesion/mass effect.Established, remote appearing, infarcts in the left cerebellum, high anterior left frontal lobe, and inferior/medial right putamen, and left caudate head.  Extensive chronic small vessel ischemic injury with confluent bilateral cerebral white matter low attenuation.  IMPRESSION: 1.  No evidence of acute infarct.  2.  Extensive chronic small vessel ischemic injury.   Original Report Authenticated By: Tiburcio Pea   Ct Head Wo Contrast  02/13/2013   *RADIOLOGY REPORT*  Clinical Data: Patient fell onto her left side while walking to the bathroom.  Prior history of stroke.  CT HEAD WITHOUT CONTRAST  Technique:  Contiguous axial images were obtained from the base of the skull through the vertex without contrast.  Comparison: CT head 09/21/2011, 09/20/2011, 10/12/2009.  MRI brain 10/13/2009.  Findings:  Moderate to severe cortical atrophy, moderate deep atrophy, and severe changes of small vessel disease of the white matter diffusely, unchanged.  No mass lesion.  No midline shift. No acute hemorrhage or hematoma.  No extra-axial fluid collections. No evidence of acute infarction.  No significant interval change since 2011.  No skull fracture or other focal osseous abnormality involving the skull.  Visualized paranasal sinuses, bilateral mastoid air cells, and bilateral middle ear cavities well-aerated.  Bilateral carotid siphon and vertebral artery atherosclerosis.  IMPRESSION:  1.  No acute intracranial abnormality. 2.  Stable moderate to severe generalized atrophy and severe chronic microvascular ischemic changes of the white matter.   Original Report Authenticated By: Hulan Saas, M.D.  Dg Knee Complete 4 Views Left  02/13/2013   *RADIOLOGY REPORT*  Clinical Data: Left knee pain  LEFT KNEE - COMPLETE 4+ VIEW  Comparison: None.  Findings:  Osteopenia without definite fracture.  Post replacement of the medial compartment of the right knee.  The caudal aspect of the intramedullary femoral rod is also noted.  No evidence of hardware failure or loosening.  There is a minimal amount of chondrocalcinosis within the remaining lateral joint space.  No joint effusion.  Vascular calcifications.  IMPRESSION:  1.  Osteopenia without definite fracture or dislocation. 2.  Post intramedullary rod fixation and replacement of the medial compartment of the left knee without evidence of hardware failure or loosening.  3.  Chondrocalcinosis suggestive of CPPD.   Original Report Authenticated By: Tacey Ruiz, MD    EKG: Independently reviewed. Sinus rhythm with no ST elevation or depression  Assessment/Plan Active Problems:   1. Generalized weakness - TSH borderline level for some therapeutic hypothyroidism we'll obtain free T4 - consult dietitian to assess for malnutrition as a contributing factor - Neurology on  board - CT of head 24 hours from last as patient has pacemaker and can't have MRI of head scheduled for CT scan on 02/15/2013 negative - Physical therapy evaluation; recommends CIR vs SNF - Vitamin b12 and folate levels - Continue to hold lasix; negative signs of fluid overload and renal function improving  - CT of head reported as no acute intracranial abnormality  2. CHF, suspect diastolic HF given normal EF -- obtain new echocardiogram - Continue home regimen - hold lasix as bun/creatinine ratio up and patient appears compensated.  3. HTN - stable continue home regimen  4. Hyperthyroidism - continue home regimen; upon return of free T4 will consider DC regimen see #1   5. DVT prophylaxis - SCD's while laying in bed.   Code Status: presumed full Family Communication: no family at bedside. Disposition Plan: Pending further work up  Time spent: 40 minutes  Drema Dallas [facility] Pager [phone]  If 7PM-7AM, please contact night-coverage [email or web address] Password [facility] 02/15/2013, 7:52 AM

## 2013-02-15 NOTE — Evaluation (Signed)
Occupational Therapy Evaluation Patient Details Name: MERIT GADSBY MRN: [id number] DOB: [birth year] Today's Date: 02/15/2013 Time: 5284-1324 OT Time Calculation (min): 25 min  OT Assessment / Plan / Recommendation History of present illness Adm for lower extremity weakness, difficulty with gait. Pt with dementia, has old CVAs   Clinical Impression   Pt admitted with above. Pt currently with functional limitations due to the deficits listed below (see OT Problem List). Pt's husband is in and out some throughout the day and their son/daugher-in-law work so no one there 24 hours consistently which is what pt currently needs, thus would benefit from post acute rehab stay. Pt will benefit from skilled OT to increase their safety and independence with ADL and functional mobility for ADL to facilitate discharge to venue listed below.       OT Assessment  Patient needs continued OT Services    Follow Up Recommendations  SNF             Frequency  Min 2X/week    Precautions / Restrictions   Fall      ADL  Eating/Feeding: Independent Where Assessed - Eating/Feeding: Chair Grooming: Set up;Supervision/safety Where Assessed - Grooming: Unsupported sitting Upper Body Bathing: Simulated;Set up;Supervision/safety Where Assessed - Upper Body Bathing: Unsupported sitting Lower Body Bathing: Simulated;Moderate assistance Where Assessed - Lower Body Bathing: Unsupported sit to stand Upper Body Dressing: Minimal assistance Where Assessed - Upper Body Dressing: Unsupported sitting Lower Body Dressing: Simulated;Maximal assistance Where Assessed - Lower Body Dressing: Supported sit to stand Toilet Transfer: Minimal assistance Toilet Transfer Method: Sit to Barista: Bedside commode Toileting - Architect and Hygiene: Min guard Where Assessed - Engineer, mining and Hygiene: Standing Equipment Used: Rolling walker;Gait  belt Transfers/Ambulation Related to ADLs: Min A for all with RW ADL Comments: difficulty getting socks pulled up well    OT Diagnosis: Generalized weakness  OT Problem List: Decreased strength;Decreased activity tolerance;Impaired balance (sitting and/or standing);Decreased knowledge of use of DME or AE OT Treatment Interventions: Self-care/ADL training;Balance training;DME and/or AE instruction;Patient/family education   OT Goals(Current goals can be found in the care plan section) Acute Rehab OT Goals OT Goal Formulation: With patient Time For Goal Achievement: 02/22/13 Potential to Achieve Goals: Good  Visit Information  History of Present Illness: Adm for lower extremity weakness, difficulty with gait. Pt with dementia, has old CVAs       Prior Functioning     Home Living Family/patient expects to be discharged to:: Skilled nursing facility Living Arrangements: Spouse/significant other Available Help at Discharge: Family Type of Home: Apartment Home Access: Level entry Home Layout: One level Home Equipment: Shower seat - built in;Shower seat;Grab bars - toilet;Hand held shower head Prior Function Level of Independence: Independent with assistive device(s);Needs assistance Gait / Transfers Assistance Needed: walks with RW ADL's / Homemaking Assistance Needed: Husband does help some with BADLs; does not do IADLs Communication / Swallowing Assistance Needed: Primary language is [place] Comments: husband is on oxygen, son lives next door but works during the day so doesn't have physical help at home Communication Communication: Prefers language other than English ([place]) Dominant Hand: Right         Vision/Perception Vision - History Patient Visual Report: No change from baseline   Cognition  Cognition Arousal/Alertness: Awake/alert Behavior During Therapy: WFL for tasks assessed/performed Overall Cognitive Status: Within Functional Limits for tasks  assessed Difficult to assess due to: Non-English speaking    Extremity/Trunk Assessment Upper Extremity Assessment Upper Extremity Assessment:  Overall [facility] for tasks assessed     Mobility Bed Mobility Bed Mobility: Rolling Right;Right Sidelying to Sit;Sitting - Scoot to Edge of Bed Rolling Right: 4: Min guard;With rail Right Sidelying to Sit: 3: Mod assist;With rails;HOB flat Sitting - Scoot to Edge of Bed: 4: Min guard;With rail Details for Bed Mobility Assistance: VCs for sequencing Transfers Transfers: Sit to Stand;Stand to Sit Sit to Stand: 4: Min assist;With upper extremity assist;From bed Stand to Sit: 4: Min assist;With upper extremity assist;With armrests;To chair/3-in-1 Details for Transfer Assistance: VCs for safe hand placement           End of Session OT - End of Session Equipment Utilized During Treatment: Gait belt;Rolling walker Activity Tolerance: Patient tolerated treatment well Patient left: in chair;with call bell/phone within reach;with chair alarm set    Evette Georges [phone] 02/15/2013, 4:34 PM

## 2013-02-15 NOTE — Progress Notes (Signed)
UR Completed.  Meela Wareing Jane [phone] 02/15/2013  

## 2013-02-15 NOTE — Progress Notes (Signed)
Physical Therapy Treatment Patient Details Name: [name] MRN: [id number] DOB: 07/24/1931 Today's Date: 02/15/2013 Time: 4098-1191 PT Time Calculation (min): 24 min  PT Assessment / Plan / Recommendation  History of Present Illness Adm for lower extremity weakness, difficulty with gait. Pt with dementia, has old CVAs   PT Comments   Pt improved compared to evaluation on 8/29. Per OT's discussion with family, she does not have consistent 24/7 care at home, however has made good improvements. Unsure if pt can qualify for inpatient rehab status due to observation status. Will await their input.   Follow Up Recommendations  CIR;Supervision/Assistance - 24 hour     Does the patient have the potential to tolerate intense rehabilitation     Barriers to Discharge        Equipment Recommendations  Other (comment) (TBA depending on d/c plans)    Recommendations for Other Services Rehab consult  Frequency Min 3X/week   Progress towards PT Goals Progress towards PT goals: Progressing toward goals  Plan Current plan remains appropriate    Precautions / Restrictions Precautions Precautions: Fall   Pertinent Vitals/Pain Only pain occurred in Lt knee with attempts to perform heel to shin    Mobility  Bed Mobility Bed Mobility: Rolling Right;Right Sidelying to Sit;Sitting - Scoot to Edge of Bed Rolling Right: 4: Min guard;With rail Right Sidelying to Sit: 3: Mod assist;With rails;HOB flat Sitting - Scoot to Edge of Bed: 4: Min guard;With rail Details for Bed Mobility Assistance: verbal and tactile cues for sequencing Transfers Transfers: Sit to Stand;Stand to Sit Sit to Stand: 4: Min guard;With upper extremity assist Stand to Sit: 4: Min guard;With upper extremity assist Details for Transfer Assistance: x3; on initial stand from EOB, pt incontinent of urine; vc each time she stood for safe use of RW Ambulation/Gait Ambulation/Gait Assistance: 4: Min assist Ambulation Distance  (Feet): 70 Feet Assistive device: Rolling walker Ambulation/Gait Assistance Details: No ataxic movements noted; LLE slightly turned in (appears from tibia) and pt reports it has been this way for years; slight shuffling and need for assist with maneuvering RW Gait Pattern: Step-through pattern;Shuffle;Wide base of support (Lt foot/Leg turned in)    Exercises Other Exercises Other Exercises: able to perform rapid alternating movements with feet (toe tapping) although velocity of movement is limited; able to perform heel to shin with RLE on Lt shin without difficulty; unable to perform on RLE with LLE due to limited LLE ROM   PT Diagnosis:    PT Problem List:   PT Treatment Interventions:     PT Goals (current goals can now be found in the care plan section) Acute Rehab PT Goals Patient Stated Goal: stronger  Visit Information  Last PT Received On: 02/15/13 Assistance Needed: +1 PT/OT Co-Evaluation/Treatment: Yes History of Present Illness: Adm for lower extremity weakness, difficulty with gait. Pt with dementia, has old CVAs    Subjective Data  Subjective: "This leg (lindicating her left) is sick" Patient Stated Goal: stronger   Cognition  Cognition Arousal/Alertness: Awake/alert Behavior During Therapy: WFL for tasks assessed/performed Overall Cognitive Status: Within Functional Limits for tasks assessed Difficult to assess due to: Non-English speaking (although speaks a fair amount of English)    Balance  Dynamic Sitting Balance Dynamic Sitting - Balance Support: No upper extremity supported;Feet supported Dynamic Sitting - Level of Assistance: 5: Stand by assistance Dynamic Sitting - Comments: able to reach fully to her Rt and then Lt foot for adjusting her slipper socks Static Standing Balance Static  Standing - Balance Support: Bilateral upper extremity supported Static Standing - Level of Assistance: 5: Stand by assistance  End of Session PT - End of Session Equipment  Utilized During Treatment: Gait belt Activity Tolerance: Patient tolerated treatment well Patient left: in chair;with call bell/phone within reach;with chair alarm set   GP     [name] 02/15/2013, 4:54 PM Pager [phone]

## 2013-02-16 DIAGNOSIS — I359 Nonrheumatic aortic valve disorder, unspecified: Secondary | ICD-10-CM

## 2013-02-16 DIAGNOSIS — R5381 Other malaise: Secondary | ICD-10-CM

## 2013-02-16 DIAGNOSIS — I509 Heart failure, unspecified: Secondary | ICD-10-CM

## 2013-02-16 NOTE — ED Provider Notes (Signed)
Medical screening examination/treatment/procedure(s) were conducted as a shared visit with non-physician practitioner(s) and myself.  I personally evaluated the patient during the encounter  [name] is a 79 y.o. female hx of CVA here with weakness. Has residual L sided weakness from previous stroke. Increased weakness today to the point that she couldn't get up from her commode. Neuro exam showed diffuse weakness, worse in L leg. Patient has difficulty walking with assistance. CT head unremarkable. Labs and UA and cxr unremarkable. Neuro called and recommend admission for possible TIA. Patient admitted to medicine.    Richardean Canal, MD 02/16/13 2122

## 2013-02-16 NOTE — Progress Notes (Signed)
Subjective: Patient has been ambulating with therapy.  Per their report the patient is improved.  Continues to require min assist.    Objective: Current vital signs: BP 107/65  Pulse 70  Temp(Src) 98.2 F (36.8 C) (Oral)  Resp 20  Ht 5\' 1"  (1.549 m)  Wt 80.7 kg (177 lb 14.6 oz)  BMI 33.63 kg/m2  SpO2 97% Vital signs in last 24 hours: Temp:  [97.8 F (36.6 C)-98.6 F (37 C)] 98.2 F (36.8 C) (08/31 1022) Pulse Rate:  [47-76] 70 (08/31 1022) Resp:  [18-20] 20 (08/31 1022) BP: (100-108)/(60-71) 107/65 mmHg (08/31 1022) SpO2:  [94 %-98 %] 97 % (08/31 1022)  Intake/Output from previous day: 08/30 0701 - 08/31 0700 In: 840 [P.O.:840] Out: -  Intake/Output this shift: Total I/O In: 240 [P.O.:240] Out: -  Nutritional status: Cardiac  Neurologic Exam: Mental Status:  Alert, awake. Speech fluent without evidence of aphasia. Able to follow 3 step commands without difficulty.  Cranial Nerves:  II: Discs flat bilaterally; Visual fields grossly normal, pupils equal, round, reactive to light and accommodation  III,IV, VI: ptosis not present, extra-ocular motions intact bilaterally  V,VII: smile symmetric, facial light touch sensation normal bilaterally  VIII: hearing normal bilaterally  IX,X: gag reflex present  XI: bilateral shoulder shrug  XII: midline tongue extension  Motor:  Gives full strength throughout. Mildly increased tone throughout with right being greater than the left  Sensory: Pinprick and light touch intact throughout, bilaterally  Deep Tendon Reflexes:  2+ throughout   Lab Results: Basic Metabolic Panel:  Recent Labs Lab 02/13/13 2002 02/14/13 0500  NA 139 140  K 4.0 4.0  CL 98 101  CO2 32 29  GLUCOSE 90 111*  BUN 21 19  CREATININE 0.94 0.79  CALCIUM 10.1 9.7  MG  --  2.3  PHOS  --  4.1    Liver Function Tests: No results found for this basename: AST, ALT, ALKPHOS, BILITOT, PROT, ALBUMIN,  in the last 168 hours No results found for this  basename: LIPASE, AMYLASE,  in the last 168 hours No results found for this basename: AMMONIA,  in the last 168 hours  CBC:  Recent Labs Lab 02/13/13 2002 02/14/13 0500  WBC 9.3 8.8  HGB 14.0 13.8  HCT 41.5 40.2  MCV 89.6 89.5  PLT 240 240    Cardiac Enzymes:  Recent Labs Lab 02/13/13 2002  TROPONINI <0.30    Lipid Panel: No results found for this basename: CHOL, TRIG, HDL, CHOLHDL, VLDL, LDLCALC,  in the last 168 hours  CBG: No results found for this basename: GLUCAP,  in the last 168 hours  Microbiology: Results for orders placed during the hospital encounter of 08/20/10  URINE CULTURE     Status: None   Collection Time    08/20/10  1:15 PM      Result Value Range Status   Specimen Description URINE, CLEAN CATCH   Final   Special Requests NONE   Final   Culture  Setup Time [id number]   Final   Colony Count NO GROWTH   Final   Culture NO GROWTH   Final   Report Status 08/21/2010 FINAL   Final  SURGICAL PCR SCREEN     Status: None   Collection Time    08/21/10  4:18 AM      Result Value Range Status   MRSA, PCR NEGATIVE  NEGATIVE Final   Staphylococcus aureus    NEGATIVE Final   Value: NEGATIVE  The Xpert SA Assay (FDA     approved for NASAL specimens     only), is one component of     a comprehensive surveillance     program.  It is not intended     to diagnose infection nor to     guide or monitor treatment.    Coagulation Studies:  Recent Labs  02/13/13 2002  LABPROT 13.9  INR 1.09    Imaging: Ct Head Wo Contrast  02/15/2013   *RADIOLOGY REPORT*  Clinical Data: Generalized weakness.  Evaluate for evolving infarcts.  CT HEAD WITHOUT CONTRAST  Technique:  Contiguous axial images were obtained from the base of the skull through the vertex without contrast.  Comparison: 02/13/2013.  Findings:  Skull:No acute osseous abnormality.  Orbits: No acute abnormality.  Brain: No evidence of acute abnormality, such as acute infarction, hemorrhage,  hydrocephalus, or mass lesion/mass effect.Established, remote appearing, infarcts in the left cerebellum, high anterior left frontal lobe, and inferior/medial right putamen, and left caudate head.  Extensive chronic small vessel ischemic injury with confluent bilateral cerebral white matter low attenuation.  IMPRESSION: 1.  No evidence of acute infarct.  2.  Extensive chronic small vessel ischemic injury.   Original Report Authenticated By: Tiburcio Pea    Medications:  I have reviewed the patient's current medications. Scheduled: . aspirin EC  81 mg Oral Daily  . clopidogrel  75 mg Oral Daily  . lisinopril  10 mg Oral Daily  . methimazole  5 mg Oral 3 times weekly  . simvastatin  40 mg Oral QPM  . sodium chloride  3 mL Intravenous Q12H  . spironolactone  25 mg Oral Daily  . vitamin C  500 mg Oral Daily    Assessment/Plan: Patient admitted with episodes of weakness.  No focality noted on examination.  At times seems more unable to coordinate her movements for walking and transferring.  May very well be related to her dementia.  Head CT repeated and shows no evidence of acute changes.    Recommendations: 1.  Continue ASA and PLavix 2.  Echo report pending   LOS: 3 days   Thana Farr, MD [facility] [phone] 02/16/2013  12:21 PM

## 2013-02-16 NOTE — Progress Notes (Signed)
Echo Lab  2D Echocardiogram completed.  Peniel Biel L Clema Skousen, RDCS 02/16/2013 11:00 AM

## 2013-02-16 NOTE — Progress Notes (Signed)
[facility] Progress note l  [name] VHQ:[id number] DOB: 04-Jul-1931 DOA: 02/13/2013  Referring physician: Renae Fickle personnel Dahlia Client Mut[place]baugh PCP: Oneal Grout, MD  Specialists: None  Chief Complaint: weakness  HPI: [name] is a 79 y.o. WF PMHx HX CVA, Hyperthyroidism, atrial fibrillation, HTN, CAD, CHF, HLD pacemaker-[employer], HX CVA, memory loss, generalized weakness, Presented to the ED complaining of weakness that occurred today.  The history is obtained from EMR as patient speaks [place] and no family member at bedside at this moment.  Reportedly patient stated that the weakness was gradual but son reports that it occurred today.  The problem has been persistent since onset and reportedly gradually getting worse. TODAY (patient understands more English than she can speak) negative pain, positive continued fatigue, positive weakness. Sitting comfortably in chair  Procedure Head CT without contrast 02/15/2013 Brain: No evidence of acute abnormality, such as acute infarction,  hemorrhage, hydrocephalus, or mass lesion/mass effect.Established,  remote appearing, infarcts in the left cerebellum, high anterior  left frontal lobe, and inferior/medial right putamen, and left  caudate head. Extensive chronic small vessel ischemic injury with  confluent bilateral cerebral white matter low attenuation.  IMPRESSION:  1. No evidence of acute infarct.  2. Extensive chronic small vessel ischemic injury.    CT head without contrast 02/13/2013 1. No acute intracranial abnormality.  2. Stable moderate to severe generalized atrophy and severe  chronic microvascular ischemic changes of the white matter   Echocardiogram 09/22/2011 - Left ventricle: Technically difficult study. The inferior septum has an unusual shape.  -There may be aneurysmal dilitation at the base of the inferolateral wall.  LVEF =55% to 60%. - Aortic valve: Mildly calcified leaflets. There was very mild  stenosis. Mean gradient: 13mm Hg (S). Peak gradient: 25mm Hg (S). Valve area: 1.25cm^2(VTI). Valve area: 1.2cm^2 (Vmax). - Mitral valve: Mild regurgitation. - Left atrium: The atrium was mildly dilated.  Brain MRI without contrast 10/13/2009  1. Acute or subacute punctate probable small vessel infarct in the  left superior frontal gyrus subcortical white matter. No mass  effect or hemorrhage.  2. Otherwise stable very advanced small vessel ischemic and  microhemorrhagic changes  Echocardiogram 02/16/2013 Left ventricle: mild LVH.  --LVEF= 50% to 55%. -- Wall motion difficult, possible inferoposterior mild hypokinesis. --(grade 1 diastolic dysfunction). - Aortic valve: Trileaflet; severely calcified leaflets. At least mild aortic stenosis. The valve is very calcified but mean gradient only 9 mmHg. Mean gradient: 9mm Hg (S). Peak gradient: 17mm Hg (S). - Mitral valve: Mild regurgitation. - Left atrium: The atrium was mildly dilated. - Right ventricle: The cavity size was normal. Pacer wire or catheter noted in right ventricle. Systolic function was normal. - Tricuspid valve: Peak RV-RA gradient: 23mm Hg (S). - Pulmonary arteries: PA peak pressure: 28mm Hg (S). - Inferior vena cava: The vessel was normal in size; the respirophasic diameter changes were in the normal range (= 50%); findings are consistent with normal central venous pressure.    Past Medical History  Diagnosis Date  . Lumbar back pain   . Other urinary incontinence   . Memory loss   . DJD (degenerative joint disease)   . Depression   . Hypertension   . CVA (cerebral infarction)   . CHF (congestive heart failure)     Chronic Diastolic  . CAD (coronary artery disease)   . Presence of permanent cardiac pacemaker   . Osteoporosis   . Hypercholesterolemia   . Hyperthyroidism   . Anxiety   .  Vertigo   . Tachy-brady syndrome   . Ischemic cardiomyopathy    Past Surgical History  Procedure Laterality Date   . Pacemaker insertion  2011  . Total knee arthroplasty  2002    bilateral  . Abdominal hysterectomy  1986  . Bilateral stenting  1999    to the RCA  . Cardiac catheterization  05/23/10  . Total abdominal hysterectomy w/ bilateral salpingoophorectomy     Social History:  reports that she has never smoked. She does not have any smokeless tobacco history on file. She reports that she does not drink alcohol or use illicit drugs.   Allergies  Allergen Reactions  . Iodine Other (See Comments)    unknown  . Iohexol      Code: HIVES, Desc: hives with nonionic cotrast 7 yrs ago ([place] iv benedryl given   . Peanut-Containing Drug Products     Family History  Problem Relation Age of Onset  . Thyroid disease Daughter   . Diabetes Other   . Hypertension Other   . Arthritis Other   . Coronary artery disease Mother 20  . Coronary artery disease Father 22  None other reported  Prior to Admission medications   Medication Sig Start Date End Date Taking? Authorizing Provider  alendronate (FOSAMAX) 70 MG tablet Take 70 mg by mouth every 7 (seven) days. Take in the morning with a full glass of water, on an empty stomach, and do not take anything else by mouth or lie down for the next 30 min. Takes on Fridays   Yes Historical Provider, MD  Ascorbic Acid (VITAMIN C) 500 MG tablet Take 500 mg by mouth daily.     Yes Historical Provider, MD  aspirin EC 81 MG tablet Take 81 mg by mouth daily.   Yes Historical Provider, MD  Calcium Carbonate-Vit D-Min 600-400 MG-UNIT TABS Take by mouth 2 (two) times daily.     Yes Historical Provider, MD  clopidogrel (PLAVIX) 75 MG tablet Take 75 mg by mouth daily.   Yes Historical Provider, MD  furosemide (LASIX) 40 MG tablet Take one tablet once a day for edema 09/09/12  Yes Tiffany L Reed, DO  lisinopril (PRINIVIL,ZESTRIL) 10 MG tablet Take 10 mg by mouth daily.   Yes Historical Provider, MD  methimazole (TAPAZOLE) 5 MG tablet Take 5 mg by mouth 3 (three) times a  week.   Yes Historical Provider, MD  Multiple Vitamins-Minerals (CENTRUM SILVER) tablet Take 1 tablet by mouth daily.     Yes Historical Provider, MD  nitroGLYCERIN (NITROSTAT) 0.4 MG SL tablet Place 0.4 mg under the tongue every 5 (five) minutes as needed. For chest pain    Yes Historical Provider, MD  polyethylene glycol (GLYCOLAX) powder Take 17 g by mouth daily as needed. constipation   Yes Historical Provider, MD  simvastatin (ZOCOR) 40 MG tablet Take 40 mg by mouth every evening.   Yes Historical Provider, MD  spironolactone (ALDACTONE) 25 MG tablet Take 25 mg by mouth daily.   Yes Historical Provider, MD   Physical Exam: Filed Vitals:   02/16/13 0526  BP: 100/65  Pulse: 70  Temp: 98 F (36.7 C)  Resp: 20     General:  Alert , NAD, follow all commands   Eyes: EOMI, non icteric  Cardiovascular: RRR, no MRG  Respiratory: CTA BL, no wheezes  Abdomen: soft, obese, NT  Skin: warm and dry, pacer present in the left upper chest wall  Musculoskeletal: no cyanosis or clubbing  Neurologic: Pupils  equal reactive to light and accommodation, cranial nerves II through XII are intact, right upper extremity strength 4/5 (patient stated chronic), left upper extremity 5/5, bilateral lower extremity 4/5, sensation intact throughout, bilateral knee reflex is diminished secondary to bilateral TKA, tongue is center line negative facial droop  Labs on Admission:  Basic Metabolic Panel:  Recent Labs Lab 02/13/13 2002 02/14/13 0500  NA 139 140  K 4.0 4.0  CL 98 101  CO2 32 29  GLUCOSE 90 111*  BUN 21 19  CREATININE 0.94 0.79  CALCIUM 10.1 9.7  MG  --  2.3  PHOS  --  4.1   Liver Function Tests: No results found for this basename: AST, ALT, ALKPHOS, BILITOT, PROT, ALBUMIN,  in the last 168 hours No results found for this basename: LIPASE, AMYLASE,  in the last 168 hours No results found for this basename: AMMONIA,  in the last 168 hours CBC:  Recent Labs Lab 02/13/13 2002  02/14/13 0500  WBC 9.3 8.8  HGB 14.0 13.8  HCT 41.5 40.2  MCV 89.6 89.5  PLT 240 240   Cardiac Enzymes:  Recent Labs Lab 02/13/13 2002  TROPONINI <0.30    BNP (last 3 results) No results found for this basename: PROBNP,  in the last 8760 hours CBG: No results found for this basename: GLUCAP,  in the last 168 hours  Radiological Exams on Admission: Ct Head Wo Contrast  02/15/2013   *RADIOLOGY REPORT*  Clinical Data: Generalized weakness.  Evaluate for evolving infarcts.  CT HEAD WITHOUT CONTRAST  Technique:  Contiguous axial images were obtained from the base of the skull through the vertex without contrast.  Comparison: 02/13/2013.  Findings:  Skull:No acute osseous abnormality.  Orbits: No acute abnormality.  Brain: No evidence of acute abnormality, such as acute infarction, hemorrhage, hydrocephalus, or mass lesion/mass effect.Established, remote appearing, infarcts in the left cerebellum, high anterior left frontal lobe, and inferior/medial right putamen, and left caudate head.  Extensive chronic small vessel ischemic injury with confluent bilateral cerebral white matter low attenuation.  IMPRESSION: 1.  No evidence of acute infarct.  2.  Extensive chronic small vessel ischemic injury.   Original Report Authenticated By: Tiburcio Pea    EKG: Independently reviewed. Sinus rhythm with no ST elevation or depression  Assessment/Plan Active Problems:   1. Generalized weakness - TSH borderline level for some therapeutic hypothyroidism; FREE T4= 1.13 which is normal - consult dietitian to assess for malnutrition as a contributing factor - Neurology on board - CT of head 24 hours from last as patient has pacemaker and can't have MRI of head scheduled for CT scan on 02/15/2013 negative - Physical therapy evaluation; recommends CIR vs SNF - Vitamin b12 and folate levels - Continue to hold lasix; negative signs of fluid overload and renal function improving  - CT of head reported as  no acute intracranial abnormality  2. CHF, suspect diastolic HF given normal EF -- obtain new echocardiogram - Continue home regimen - hold lasix as bun/creatinine ratio up and patient appears compensated.  3. HTN - stable continue home regimen  4. Hyperthyroidism - continue home regimen; upon return of free T4 will consider DC regimen see #1   5. DVT prophylaxis - SCD's while laying in bed.   Code Status: presumed full Family Communication: no family at bedside. Disposition Plan: Pending further work up  Time spent: 40 minutes  Drema Dallas [facility] Pager [phone]  If 7PM-7AM, please contact night-coverage [email or web address] Password [facility] 02/16/2013,  8:47 AM

## 2013-02-17 NOTE — Progress Notes (Signed)
Spoke with patient's son and son stated that someone would be at home with patient 24/7 to assist with her needs when she returns home.  Son is interested in inpatient rehab if that is an option.  Informed MD.  Larose Hires, Leta Jungling R 02/17/2013

## 2013-02-17 NOTE — Progress Notes (Signed)
02/15/13 1634  OT Time Calculation  OT Start Time 1541  OT Stop Time 1606  OT Time Calculation (min) 25 min  OT G-codes **NOT FOR INPATIENT CLASS**  Functional Assessment Tool Used Clincal observation  Functional Limitation Self care  Self Care Current Status (Y8657) CL  Self Care Goal Status (Q4696) CJ  OT General Charges  $OT Visit 1 Procedure  OT Evaluation  $Initial OT Evaluation Tier I 1 Procedure  OT Treatments  $Self Care/Home Management  8-22 mins   Late entry for 02/15/13 Ignacia Palma, OTR/L [phone] 02/17/2013

## 2013-02-17 NOTE — Progress Notes (Signed)
[facility] Progress note l  [name] YNW:[id number] DOB: [birth year] DOA: 02/13/2013  Referring physician: Renae Fickle personnel Dahlia Client [place]augh PCP: Oneal Grout, MD  Specialists: None  Chief Complaint: weakness  HPI: [name] is a 79 y.o. WF PMHx HX CVA, Hyperthyroidism, atrial fibrillation, HTN, CAD, CHF, HLD pacemaker-[employer], HX CVA, memory loss, generalized weakness, Presented to the ED complaining of weakness that occurred today.  The history is obtained from EMR as patient speaks [place] and no family member at bedside at this moment.  Reportedly patient stated that the weakness was gradual but son reports that it occurred today.  The problem has been persistent since onset and reportedly gradually getting worse. TODAY (patient understands more English than she can speak) negative pain, positive continued fatigue, positive weakness. Laying comfortably in bed   Procedure Echocardiogram 02/16/2013 - Left ventricle:  mild LVH.  --LVEF= 50% to 55%. -- possible inferoposterior mild hypokinesis. --(grade 1 diastolic dysfunction). - Aortic valve: Trileaflet; severely calcified leaflets. At least mild aortic stenosis. The valve is very calcified but mean gradient only 9 mmHg. Mean gradient: 9mm Hg (S). Peak gradient: 17mm Hg (S). - Mitral valve: Mild regurgitation. - Left atrium: The atrium was mildly dilated. - Right ventricle: The cavity size was normal. Pacer wire or catheter noted in right ventricle. Systolic function was normal. - Tricuspid valve: Peak RV-RA gradient: 23mm Hg (S). - Pulmonary arteries: PA peak pressure: 28mm Hg (S). - Inferior vena cava: The vessel was normal in size; the respirophasic diameter changes were in the normal range (= 50%); findings are consistent with normal central venous pressure.     Head CT without contrast 02/15/2013 Brain: No evidence of acute abnormality, such as acute infarction,  hemorrhage, hydrocephalus, or mass  lesion/mass effect.Established,  remote appearing, infarcts in the left cerebellum, high anterior  left frontal lobe, and inferior/medial right putamen, and left  caudate head. Extensive chronic small vessel ischemic injury with  confluent bilateral cerebral white matter low attenuation.  IMPRESSION:  1. No evidence of acute infarct.  2. Extensive chronic small vessel ischemic injury.    CT head without contrast 02/13/2013 1. No acute intracranial abnormality.  2. Stable moderate to severe generalized atrophy and severe  chronic microvascular ischemic changes of the white matter   Echocardiogram 09/22/2011 - Left ventricle: Technically difficult study. The inferior septum has an unusual shape.  -There may be aneurysmal dilitation at the base of the inferolateral wall.  LVEF =55% to 60%. - Aortic valve: Mildly calcified leaflets. There was very mild stenosis. Mean gradient: 13mm Hg (S). Peak gradient: 25mm Hg (S). Valve area: 1.25cm^2(VTI). Valve area: 1.2cm^2 (Vmax). - Mitral valve: Mild regurgitation. - Left atrium: The atrium was mildly dilated.  Brain MRI without contrast 10/13/2009  1. Acute or subacute punctate probable small vessel infarct in the  left superior frontal gyrus subcortical white matter. No mass  effect or hemorrhage.  2. Otherwise stable very advanced small vessel ischemic and  microhemorrhagic changes  Echocardiogram 02/16/2013 Left ventricle: mild LVH.  --LVEF= 50% to 55%. -- Wall motion difficult, possible inferoposterior mild hypokinesis. --(grade 1 diastolic dysfunction). - Aortic valve: Trileaflet; severely calcified leaflets. At least mild aortic stenosis. The valve is very calcified but mean gradient only 9 mmHg. Mean gradient: 9mm Hg (S). Peak gradient: 17mm Hg (S). - Mitral valve: Mild regurgitation. - Left atrium: The atrium was mildly dilated. - Right ventricle: The cavity size was normal. Pacer wire or catheter noted in right ventricle.  Systolic function  was normal. - Tricuspid valve: Peak RV-RA gradient: 23mm Hg (S). - Pulmonary arteries: PA peak pressure: 28mm Hg (S). - Inferior vena cava: The vessel was normal in size; the respirophasic diameter changes were in the normal range (= 50%); findings are consistent with normal central venous pressure.    Past Medical History  Diagnosis Date  . Lumbar back pain   . Other urinary incontinence   . Memory loss   . DJD (degenerative joint disease)   . Depression   . Hypertension   . CVA (cerebral infarction)   . CHF (congestive heart failure)     Chronic Diastolic  . CAD (coronary artery disease)   . Presence of permanent cardiac pacemaker   . Osteoporosis   . Hypercholesterolemia   . Hyperthyroidism   . Anxiety   . Vertigo   . Tachy-brady syndrome   . Ischemic cardiomyopathy    Past Surgical History  Procedure Laterality Date  . Pacemaker insertion  2011  . Total knee arthroplasty  2002    bilateral  . Abdominal hysterectomy  1986  . Bilateral stenting  1999    to the RCA  . Cardiac catheterization  05/23/10  . Total abdominal hysterectomy w/ bilateral salpingoophorectomy     Social History:  reports that she has never smoked. She does not have any smokeless tobacco history on file. She reports that she does not drink alcohol or use illicit drugs.   Allergies  Allergen Reactions  . Iodine Other (See Comments)    unknown  . Iohexol      Code: HIVES, Desc: hives with nonionic cotrast 7 yrs ago ([place] iv benedryl given   . Peanut-Containing Drug Products     Family History  Problem Relation Age of Onset  . Thyroid disease Daughter   . Diabetes Other   . Hypertension Other   . Arthritis Other   . Coronary artery disease Mother 72  . Coronary artery disease Father 53  None other reported  Prior to Admission medications   Medication Sig Start Date End Date Taking? Authorizing Provider  alendronate (FOSAMAX) 70 MG tablet Take 70 mg by mouth  every 7 (seven) days. Take in the morning with a full glass of water, on an empty stomach, and do not take anything else by mouth or lie down for the next 30 min. Takes on Fridays   Yes Historical Provider, MD  Ascorbic Acid (VITAMIN C) 500 MG tablet Take 500 mg by mouth daily.     Yes Historical Provider, MD  aspirin EC 81 MG tablet Take 81 mg by mouth daily.   Yes Historical Provider, MD  Calcium Carbonate-Vit D-Min 600-400 MG-UNIT TABS Take by mouth 2 (two) times daily.     Yes Historical Provider, MD  clopidogrel (PLAVIX) 75 MG tablet Take 75 mg by mouth daily.   Yes Historical Provider, MD  furosemide (LASIX) 40 MG tablet Take one tablet once a day for edema 09/09/12  Yes Tiffany L Reed, DO  lisinopril (PRINIVIL,ZESTRIL) 10 MG tablet Take 10 mg by mouth daily.   Yes Historical Provider, MD  methimazole (TAPAZOLE) 5 MG tablet Take 5 mg by mouth 3 (three) times a week.   Yes Historical Provider, MD  Multiple Vitamins-Minerals (CENTRUM SILVER) tablet Take 1 tablet by mouth daily.     Yes Historical Provider, MD  nitroGLYCERIN (NITROSTAT) 0.4 MG SL tablet Place 0.4 mg under the tongue every 5 (five) minutes as needed. For chest pain    Yes Historical  Provider, MD  polyethylene glycol (GLYCOLAX) powder Take 17 g by mouth daily as needed. constipation   Yes Historical Provider, MD  simvastatin (ZOCOR) 40 MG tablet Take 40 mg by mouth every evening.   Yes Historical Provider, MD  spironolactone (ALDACTONE) 25 MG tablet Take 25 mg by mouth daily.   Yes Historical Provider, MD   Physical Exam: Filed Vitals:   02/17/13 1434  BP: 104/57  Pulse: 69  Temp: 98.2 F (36.8 C)  Resp: 18     General:  Alert , NAD, follow all commands   Eyes: EOMI, non icteric  Cardiovascular: RRR, no MRG  Respiratory: CTA BL, no wheezes  Abdomen: soft, obese, NT  Skin: warm and dry, pacer present in the left upper chest wall  Musculoskeletal: no cyanosis or clubbing  Neurologic: Pupils equal reactive to  light and accommodation, cranial nerves II through XII are intact, right upper extremity strength 4/5 (patient stated chronic), left upper extremity 5/5, bilateral lower extremity 4/5, sensation intact throughout,     Labs on Admission:  Basic Metabolic Panel:  Recent Labs Lab 02/13/13 2002 02/14/13 0500  NA 139 140  K 4.0 4.0  CL 98 101  CO2 32 29  GLUCOSE 90 111*  BUN 21 19  CREATININE 0.94 0.79  CALCIUM 10.1 9.7  MG  --  2.3  PHOS  --  4.1   Liver Function Tests: No results found for this basename: AST, ALT, ALKPHOS, BILITOT, PROT, ALBUMIN,  in the last 168 hours No results found for this basename: LIPASE, AMYLASE,  in the last 168 hours No results found for this basename: AMMONIA,  in the last 168 hours CBC:  Recent Labs Lab 02/13/13 2002 02/14/13 0500  WBC 9.3 8.8  HGB 14.0 13.8  HCT 41.5 40.2  MCV 89.6 89.5  PLT 240 240   Cardiac Enzymes:  Recent Labs Lab 02/13/13 2002  TROPONINI <0.30    BNP (last 3 results) No results found for this basename: PROBNP,  in the last 8760 hours CBG: No results found for this basename: GLUCAP,  in the last 168 hours  Radiological Exams on Admission: No results found.  EKG: Independently reviewed. Sinus rhythm with no ST elevation or depression  Assessment/Plan Active Problems:  1. Generalized weakness - TSH borderline level for some therapeutic hypothyroidism; FREE T4= 1.13 which is normal - consult dietitian to assess for malnutrition as a contributing factor - Neurology on board - scheduled for CT scan on 02/15/2013 negative - Physical therapy evaluation; recommends CIR, final rehabilitation consultation for CIR pending   - Vitamin b12 and folate levels - Continue to hold lasix; negative signs of fluid overload and renal function improving  - CT of head reported as no acute intracranial abnormality  2. CHF, suspect diastolic HF given normal EF -- Echocardiogram complete see results above - Continue home  regimen - hold lasix as bun/creatinine ratio up and patient appears compensated.  3. HTN - stable continue home regimen  4. Hyperthyroidism - continue home regimen; upon return of free T4 will consider DC regimen see #1   5. DVT prophylaxis - SCD's while laying in bed.   Code Status: presumed full Family Communication: no family at bedside. Disposition Plan: Pending further work up  Time spent: 40 minutes  Drema Dallas [facility] Pager [phone]  If 7PM-7AM, please contact night-coverage [email or web address] Password [facility] 02/17/2013, 7:48 PM

## 2013-02-17 NOTE — Progress Notes (Signed)
Physical Therapy Treatment Patient Details Name: [name] MRN: [id number] DOB: 08/25/1931 Today's Date: 02/17/2013 Time: 7253-6644 PT Time Calculation (min): 23 min  PT Assessment / Plan / Recommendation  History of Present Illness Adm for lower extremity weakness, difficulty with gait. Pt with dementia, has old CVAs   PT Comments   Husband present throughout session. He reports that he is the only one who will be with her during the day and that he cannot provide physical assist due to his own health problems (back, on home O2). He is not sure what his son meant when I told him his son had told the RN that there would be help during the day. Feel pt can achieve a supervision level to allow discharge home with husband.    Follow Up Recommendations  CIR;Supervision/Assistance - 24 hour     Does the patient have the potential to tolerate intense rehabilitation     Barriers to Discharge        Equipment Recommendations  Other (comment) (TBA depending on d/c plans)    Recommendations for Other Services Rehab consult  Frequency Min 3X/week   Progress towards PT Goals Progress towards PT goals: Progressing toward goals  Plan Other (comment);Current plan remains appropriate (husband can provide 24 hr supervision due to health issues)    Precautions / Restrictions Precautions Precautions: Fall   Pertinent Vitals/Pain Pain at old fx site (Lt hip/thigh) with LLE marching; patient repositioned for comfort     Mobility  Bed Mobility Bed Mobility: Not assessed Transfers Transfers: Sit to Stand;Stand to Sit Sit to Stand: With upper extremity assist;5: Supervision Stand to Sit: With upper extremity assist;4: Min guard Details for Transfer Assistance: x4; required verbal and then hand-over-hand assist to reach back to surface prior to sitting; properly sequencing sit to stand with RW Ambulation/Gait Ambulation/Gait Assistance: 4: Min guard Ambulation Distance (Feet): 150  Feet Assistive device: Rolling walker Ambulation/Gait Assistance Details: vc for upright posture and to keep RW closer to her body; no coordination errors noted while ambulating; husband reports her walking is "weaker" Gait Pattern: Step-through pattern;Wide base of support;Decreased stride length (Lt foot/Leg turned in) Gait velocity: decreased    Exercises General Exercises - Lower Extremity Hip Flexion/Marching: AROM;Both;5 reps;Standing Heel Raises: AROM;Both;5 reps;Standing   PT Diagnosis:    PT Problem List:   PT Treatment Interventions:     PT Goals (current goals can now be found in the care plan section) Acute Rehab PT Goals Patient Stated Goal: stronger  Visit Information  Last PT Received On: 02/17/13 Assistance Needed: +1 PT/OT Co-Evaluation/Treatment: Yes History of Present Illness: Adm for lower extremity weakness, difficulty with gait. Pt with dementia, has old CVAs    Subjective Data  Subjective: Husband states she has used a RW for 4+years. States she is not walking as well as she did prior to 8/28 Patient Stated Goal: stronger   Cognition  Cognition Arousal/Alertness: Awake/alert Behavior During Therapy: WFL for tasks assessed/performed Overall Cognitive Status: Within Functional Limits for tasks assessed Difficult to assess due to: Non-English speaking (although speaks a fair amount of English; understands more)    [employer]ed forces operational officer Standing Balance Static Standing - Balance Support: Bilateral upper extremity supported Static Standing - Level of Assistance: 5: Stand by assistance Static Standing - Comment/# of Minutes: pt refused to attempt balance activites without UE support (via RW) Dynamic Standing Balance Dynamic Standing - Balance Support: Bilateral upper extremity supported Dynamic Standing - Level of Assistance: 4: Min assist Dynamic  Standing - Comments: bil UE support via RW; balance activities (heel raises, marching)  End of Session PT - End of  Session Equipment Utilized During Treatment: Gait belt Activity Tolerance: Patient tolerated treatment well Patient left: in chair;with call bell/phone within reach;with chair alarm set;with family/visitor present   GP     Elton Heid 02/17/2013, 2:15 PM Pager [phone]

## 2013-02-18 ENCOUNTER — Other Ambulatory Visit: Payer: Self-pay | Admitting: Internal Medicine

## 2013-02-18 DIAGNOSIS — R5381 Other malaise: Secondary | ICD-10-CM

## 2013-02-18 MED ORDER — LISINOPRIL 10 MG PO TABS: 10.0000 mg | ORAL_TABLET | Freq: Every day | ORAL | Status: DC

## 2013-02-18 MED ORDER — SPIRONOLACTONE 25 MG PO TABS: 25.0000 mg | ORAL_TABLET | Freq: Every day | ORAL | Status: DC

## 2013-02-18 MED ORDER — CLOPIDOGREL BISULFATE 75 MG PO TABS: 75.0000 mg | ORAL_TABLET | Freq: Every day | ORAL | Status: DC

## 2013-02-18 NOTE — Progress Notes (Signed)
Occupational Therapy Treatment Patient Details Name: [name] MRN: [id number] DOB: 07-08-1931 Today's Date: 02/18/2013 Time: 1191-4782 OT Time Calculation (min): 25 min  OT Assessment / Plan / Recommendation  History of present illness Adm for lower extremity weakness, difficulty with gait. Pt with dementia, has old CVAs   OT comments  Pt has made significant progress. Feel pt is safe to D/C home with family with HHOT/PT. 24/7 S ideal initially, then  Intermittent S. Unsure if pt has youth RW at home due to language barrier. Nsg to ask son. If pt does not have one, she needs youth RW to D/C home.  Follow Up Recommendations  Home health OT    Barriers to Discharge       Equipment Recommendations  None recommended by OT    Recommendations for Other Services    Frequency Min 2X/week   Progress towards OT Goals Progress towards OT goals: Goals met and updated - see care plan (goals met. further treatment by [facility])  Plan Discharge plan needs to be updated    Precautions / Restrictions Precautions Precautions: Fall Restrictions Weight Bearing Restrictions: No   Pertinent Vitals/Pain no apparent distress     ADL  Toilet Transfer: Supervision/safety Toilet Transfer Method: Other (comment) (ambulating) Toilet Transfer Equipment: Comfort height toilet Toileting - Clothing Manipulation and Hygiene: Modified independent Where Assessed - Toileting Clothing Manipulation and Hygiene: Sit to stand from 3-in-1 or toilet Equipment Used: Gait belt;Rolling walker Transfers/Ambulation Related to ADLs: S RW level ADL Comments: improvement fro previous session    OT Diagnosis:    OT Problem List:   OT Treatment Interventions:     OT Goals(current goals can now be found in the care plan section) Acute Rehab OT Goals Patient Stated Goal: stronger OT Goal Formulation: With patient Time For Goal Achievement: 02/22/13 Potential to Achieve Goals: Good ADL Goals Pt Will Perform  Grooming: with set-up;with supervision;standing Pt Will Perform Lower Body Dressing: with min assist;sit to/from stand Pt Will Transfer to Toilet: with supervision;ambulating;bedside commode Additional ADL Goal #1: Pt will be S for in and OOB with HOB flat and no rail.  Visit Information  Last OT Received On: 02/18/13 Assistance Needed: +1 History of Present Illness: Adm for lower extremity weakness, difficulty with gait. Pt with dementia, has old CVAs    Subjective Data      Prior Functioning       Cognition  Cognition Arousal/Alertness: Awake/alert Behavior During Therapy: WFL for tasks assessed/performed Overall Cognitive Status: No family/caregiver present to determine baseline cognitive functioning Difficult to assess due to: Non-English speaking    Mobility  Bed Mobility Bed Mobility: Rolling Right;Right Sidelying to Sit Rolling Right: 5: Supervision;With rail Right Sidelying to Sit: 5: Supervision;HOB flat;With rails Details for Bed Mobility Assistance: relied heavily on rail to push up to sitting.  Transfers Transfers: Sit to Stand;Stand to Sit Sit to Stand: 5: Supervision;From chair/3-in-1 Stand to Sit: 6: Modified independent (Device/Increase time);To chair/3-in-1 Details for Transfer Assistance: cues for hand placement    Exercises  General Exercises - Lower Extremity Ankle Circles/Pumps: AROM;Both;10 reps Heel Slides: AROM;Strengthening;Both;10 reps Hip ABduction/ADduction: AROM;Strengthening;Both;10 reps Straight Leg Raises: AROM;Strengthening;Both;10 reps   Balance Static Standing Balance Static Standing - Balance Support: During functional activity;Bilateral upper extremity supported Static Standing - Level of Assistance: 5: Stand by assistance Static Standing - Comment/# of Minutes: Pt leaning fwds with bil UE's supported on sink countertop to wash face & hands   End of Session OT - End of Session  Equipment Utilized During Treatment: Gait belt;Rolling  walker Activity Tolerance: Patient tolerated treatment well Patient left: in chair;with call bell/phone within reach;with chair alarm set Nurse Communication: Mobility status  GO Functional Assessment Tool Used: Clincal observation Functional Limitation: Self care Self Care Current Status [phone]): At least 1 percent but less than 20 percent impaired, limited or restricted Self Care Goal Status (U0454): At least 20 percent but less than 40 percent impaired, limited or restricted Self Care Discharge Status [phone]): At least 1 percent but less than 20 percent impaired, limited or restricted   Axle Parfait,HILLARY 02/18/2013, 12:04 PM [facility], OTR/L  [phone] 02/18/2013

## 2013-02-18 NOTE — Consult Note (Signed)
Physical Medicine and Rehabilitation Consult Reason for Consult: History CVA/dementia Referring Physician: [facility]   HPI: [name] is a 79 y.o. 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 Admitted 02/14/2013 with acute bilateral lower extremity weakness. Cranial CT scan showed no evidence of acute infarction. Echocardiogram with ejection fraction of 55% grade 1 diastolic dysfunction. MRI not completed with history of pacemaker. Neurology services consulted maintained on Plavix and aspirin for CVA prophylaxis as prior to hospital admission. Physical and occupational therapy evaluations completed an ongoing with request for physical medicine rehabilitation consult 02/17/2013 to consider inpatient rehabilitation services.   Review of Systems  Unable to perform ROS: language   Past Medical History  Diagnosis Date  . Lumbar back pain   . Other urinary incontinence   . Memory loss   . DJD (degenerative joint disease)   . Depression   . Hypertension   . CVA (cerebral infarction)   . CHF (congestive heart failure)     Chronic Diastolic  . CAD (coronary artery disease)   . Presence of permanent cardiac pacemaker   . Osteoporosis   . Hypercholesterolemia   . Hyperthyroidism   . Anxiety   . Vertigo   . Tachy-brady syndrome   . Ischemic cardiomyopathy    Past Surgical History  Procedure Laterality Date  . Pacemaker insertion  2011  . Total knee arthroplasty  2002    bilateral  . Abdominal hysterectomy  1986  . Bilateral stenting  1999    to the RCA  . Cardiac catheterization  05/23/10  . Total abdominal hysterectomy w/ bilateral salpingoophorectomy     Family History  Problem Relation Age of Onset  . Thyroid disease Daughter   . Diabetes Other   . Hypertension Other   . Arthritis Other   . Coronary artery disease  Mother 67  . Coronary artery disease Father 65   Social History:  reports that she has never smoked. She does not have any smokeless tobacco history on file. She reports that she does not drink alcohol or use illicit drugs. Allergies:  Allergies  Allergen Reactions  . Iodine Other (See Comments)    unknown  . Iohexol      Code: HIVES, Desc: hives with nonionic cotrast 7 yrs ago ([place] iv benedryl given   . Peanut-Containing Drug Products    Medications Prior to Admission  Medication Sig Dispense Refill  . alendronate (FOSAMAX) 70 MG tablet Take 70 mg by mouth every 7 (seven) days. Take in the morning with a full glass of water, on an empty stomach, and do not take anything else by mouth or lie down for the next 30 min. Takes on Fridays      . Ascorbic Acid (VITAMIN C) 500 MG tablet Take 500 mg by mouth daily.        Marland Kitchen aspirin EC 81 MG tablet Take 81 mg by mouth daily.      . Calcium Carbonate-Vit D-Min 600-400 MG-UNIT TABS Take by mouth 2 (two) times daily.        . clopidogrel (PLAVIX) 75 MG tablet Take 75 mg by mouth daily.      . furosemide (LASIX) 40 MG tablet Take one tablet once a day for edema  30 tablet  5  . lisinopril (PRINIVIL,ZESTRIL) 10 MG tablet Take 10 mg by mouth daily.      Marland Kitchen  methimazole (TAPAZOLE) 5 MG tablet Take 5 mg by mouth 3 (three) times a week.      . Multiple Vitamins-Minerals (CENTRUM SILVER) tablet Take 1 tablet by mouth daily.        . nitroGLYCERIN (NITROSTAT) 0.4 MG SL tablet Place 0.4 mg under the tongue every 5 (five) minutes as needed. For chest pain       . polyethylene glycol (GLYCOLAX) powder Take 17 g by mouth daily as needed. constipation      . simvastatin (ZOCOR) 40 MG tablet Take 40 mg by mouth every evening.      Marland Kitchen spironolactone (ALDACTONE) 25 MG tablet Take 25 mg by mouth daily.        Home: Home Living Family/patient expects to be discharged to:: Skilled nursing facility Living Arrangements: Spouse/significant other Available Help at  Discharge: Family Type of Home: Apartment Home Access: Level entry Home Layout: One level Home Equipment: Shower seat - built in;Shower seat;Grab bars - toilet;Hand held shower head  Functional History: Prior Function Comments: husband is on oxygen, son lives next door but works during the day so doesn't have physical help at home Functional Status:  Mobility: Bed Mobility Bed Mobility: Not assessed Rolling Right: 4: Min guard;With rail Right Sidelying to Sit: 3: Mod assist;With rails;HOB flat Supine to Sit: 4: Min assist;With rails Sitting - Scoot to Edge of Bed: 4: Min guard;With rail Sit to Supine: 4: Min assist Transfers Transfers: Sit to Stand;Stand to Sit Sit to Stand: With upper extremity assist;5: Supervision Stand to Sit: With upper extremity assist;4: Min guard Ambulation/Gait Ambulation/Gait Assistance: 4: Min guard Ambulation Distance (Feet): 150 Feet Assistive device: Rolling walker Ambulation/Gait Assistance Details: vc for upright posture and to keep RW closer to her body; no coordination errors noted while ambulating; husband reports her walking is "weaker" Gait Pattern: Step-through pattern;Wide base of support;Decreased stride length (Lt foot/Leg turned in) Gait velocity: decreased    ADL: ADL Eating/Feeding: Independent Where Assessed - Eating/Feeding: Chair Grooming: Set up;Supervision/safety Where Assessed - Grooming: Unsupported sitting Upper Body Bathing: Simulated;Set up;Supervision/safety Where Assessed - Upper Body Bathing: Unsupported sitting Lower Body Bathing: Simulated;Moderate assistance Where Assessed - Lower Body Bathing: Unsupported sit to stand Upper Body Dressing: Minimal assistance Where Assessed - Upper Body Dressing: Unsupported sitting Lower Body Dressing: Simulated;Maximal assistance Where Assessed - Lower Body Dressing: Supported sit to stand Toilet Transfer: Minimal assistance Toilet Transfer Method: Sit to Writer: Bedside commode Equipment Used: Rolling walker;Gait belt Transfers/Ambulation Related to ADLs: Min A for all with RW ADL Comments: difficulty getting socks pulled up well  Cognition: Cognition Overall Cognitive Status: Within Functional Limits for tasks assessed Orientation Level: Oriented to person;Oriented to place;Oriented to situation;Disoriented to time Cognition Arousal/Alertness: Awake/alert Behavior During Therapy: WFL for tasks assessed/performed Overall Cognitive Status: Within Functional Limits for tasks assessed Difficult to assess due to: Non-English speaking (although speaks a fair amount of English; understands more)  Blood pressure 125/68, pulse 74, temperature 98.2 F (36.8 C), temperature source Oral, resp. rate 18, height 5\' 1"  (1.549 m), weight 80.7 kg (177 lb 14.6 oz), SpO2 99.00%. Physical Exam  Vitals reviewed. Constitutional:  79 year old female that speaks very little language. Patient sitting up in chair feeding herself breakfast.  HENT:  Head: Normocephalic.  Eyes: EOM are normal.  Neck: Normal range of motion. Neck supple. No thyromegaly present.  Cardiovascular:  Cardiac rate controlled  Pulmonary/Chest: Effort normal and breath sounds normal. No respiratory distress.  Abdominal: Soft. Bowel sounds are normal.  She exhibits no distension.  Neurological: She is alert.  Patient makes good eye contact with examiner. She will follow simple demonstrate commands. She speaks very little [place]. Fair sitting balance. Slight right sided weakness.   Skin: Skin is warm and dry.  Psychiatric:  Difficult to assess cognition in depth given language barrier    No results found for this or any previous visit (from the past 24 hour(s)). No results found.  Assessment/Plan: Diagnosis: gait disorder, prior cva 1. Does the need for close, 24 hr/day medical supervision in concert with the patient's rehab needs make it unreasonable for this patient to be  served in a less intensive setting? No 2. Co-Morbidities requiring supervision/potential complications: cad, chf 3. Due to bladder management, bowel management, safety, skin/wound care, disease management, medication administration and pain management, does the patient require 24 hr/day rehab nursing? No 4. Does the patient require coordinated care of a physician, rehab nurse, PT, OT, SLP to address physical and functional deficits in the context of the above medical diagnosis(es)? No Addressing deficits in the following areas: balance, endurance, locomotion, transferring, dressing, toileting and cognition 5. Can the patient actively participate in an intensive therapy program of at least 3 hrs of therapy per day at least 5 days per week? Potentially 6. The potential for patient to make measurable gains while on inpatient rehab is fair and poor 7. Anticipated functional outcomes upon discharge from inpatient rehab are n/a with PT, n/a with OT, n/a with SLP. 8. Estimated rehab length of stay to reach the above functional goals is: n/a 9. Does the patient have adequate social supports to accommodate these discharge functional goals? Potentially 10. Anticipated D/C setting: Home 11. Anticipated post D/C treatments: HH therapy 12. Overall Rehab/Functional Prognosis: fair  RECOMMENDATIONS: This patient's condition is appropriate for continued rehabilitative care in the following setting: SNF (or HH if son can assist.) Patient has agreed to participate in recommended program. Potentially Note that insurance prior authorization may be required for reimbursement for recommended care.  Comment: Pt with chronic gait disorder/dementia. Inpatient rehab unlikely to impact her functional deficits. Husband apparently can't provide physical assistance. Son may be able to provide supervision.  Ranelle Oyster, MD, [facility] Physical Medicine & Rehabilitation     02/18/2013

## 2013-02-18 NOTE — Care Management Note (Signed)
    Page 1 of 1   02/18/2013     4:03:19 PM   CARE MANAGEMENT NOTE 02/18/2013  Patient:  ESMA, KILTS   Account Number:  [id number]  Date Initiated:  02/18/2013  Documentation initiated by:  Elmer Bales  Subjective/Objective Assessment:   patient admitted for cva     Action/Plan:   will follow for discharge needs   Anticipated DC Date:  02/18/2013   Anticipated DC Plan:  HOME W HOME HEALTH SERVICES      DC Planning Services  CM consult      Choice offered to / List presented to:  C-4 Adult Children        HH arranged  HH-2 PT  HH-4 NURSE'S AIDE      HH agency  [facility]   Status of service:  Completed, signed off Medicare Important Message given?   (If response is "NO", the following Medicare IM given date fields will be blank) Date Medicare IM given:   Date Additional Medicare IM given:    Discharge Disposition:  HOME W HOME HEALTH SERVICES  Per UR Regulation:  Reviewed for med. necessity/level of care/duration of stay  If discussed at Long Length of Stay Meetings, dates discussed:    Comments:  02/18/13 1400 Elmer Bales RN, MSN, CM- Spoke with patient's son Ed via phone to discuss home health. patient's son selected [facility] for home health needs. Mary with [facility] was notified of referral.  Due to language barrier, son Elesha Thedford has requested that [facility] use his mobile number to set up all appointments.  Number is listed in the chart.

## 2013-02-18 NOTE — Progress Notes (Signed)
Patient is at supervision level with P.T. Patient not in need for inpatient rehab admission at this time. i will alert RNCM [phone]

## 2013-02-18 NOTE — Discharge Summary (Signed)
[facility] Discharge summary  [name] ZOX:[id number] DOB: 1932-06-04 DOA: 02/13/2013  Referring physician: Renae Fickle personnel Dahlia Client Mut[place]baugh PCP: Oneal Grout, MD  Specialists: None  Chief Complaint: weakness  HPI: [name] is a 79 y.o. WF PMHx HX CVA, Hyperthyroidism, atrial fibrillation, HTN, CAD, CHF, HLD pacemaker-[employer], HX CVA, memory loss, generalized weakness, Presented to the ED complaining of weakness that occurred today.  The history is obtained from EMR as patient speaks [place] and no family member at bedside at this moment.  Reportedly patient stated that the weakness was gradual but son reports that it occurred today.  The problem has been persistent since onset and reportedly gradually getting worse. TODAY (patient understands more English than she can speak) negative pain, feeling improved, sitting comfortably in chair.   Procedure Echocardiogram 02/16/2013 - Left ventricle:  mild LVH.  --LVEF= 50% to 55%. -- possible inferoposterior mild hypokinesis. --(grade 1 diastolic dysfunction). - Aortic valve: Trileaflet; severely calcified leaflets. At least mild aortic stenosis. The valve is very calcified but mean gradient only 9 mmHg. Mean gradient: 9mm Hg (S). Peak gradient: 17mm Hg (S). - Mitral valve: Mild regurgitation. - Left atrium: The atrium was mildly dilated. - Right ventricle: The cavity size was normal. Pacer wire or catheter noted in right ventricle. Systolic function was normal. - Tricuspid valve: Peak RV-RA gradient: 23mm Hg (S). - Pulmonary arteries: PA peak pressure: 28mm Hg (S). - Inferior vena cava: The vessel was normal in size; the respirophasic diameter changes were in the normal range (= 50%); findings are consistent with normal central venous pressure.     Head CT without contrast 02/15/2013 Brain: No evidence of acute abnormality, such as acute infarction,  hemorrhage, hydrocephalus, or mass lesion/mass  effect.Established,  remote appearing, infarcts in the left cerebellum, high anterior  left frontal lobe, and inferior/medial right putamen, and left  caudate head. Extensive chronic small vessel ischemic injury with  confluent bilateral cerebral white matter low attenuation.  IMPRESSION:  1. No evidence of acute infarct.  2. Extensive chronic small vessel ischemic injury.    CT head without contrast 02/13/2013 1. No acute intracranial abnormality.  2. Stable moderate to severe generalized atrophy and severe  chronic microvascular ischemic changes of the white matter   Echocardiogram 09/22/2011 - Left ventricle: Technically difficult study. The inferior septum has an unusual shape.  -There may be aneurysmal dilitation at the base of the inferolateral wall.  LVEF =55% to 60%. - Aortic valve: Mildly calcified leaflets. There was very mild stenosis. Mean gradient: 13mm Hg (S). Peak gradient: 25mm Hg (S). Valve area: 1.25cm^2(VTI). Valve area: 1.2cm^2 (Vmax). - Mitral valve: Mild regurgitation. - Left atrium: The atrium was mildly dilated.  Brain MRI without contrast 10/13/2009  1. Acute or subacute punctate probable small vessel infarct in the  left superior frontal gyrus subcortical white matter. No mass  effect or hemorrhage.  2. Otherwise stable very advanced small vessel ischemic and  microhemorrhagic changes  Echocardiogram 02/16/2013 Left ventricle: mild LVH.  --LVEF= 50% to 55%. -- Wall motion difficult, possible inferoposterior mild hypokinesis. --(grade 1 diastolic dysfunction). - Aortic valve: Trileaflet; severely calcified leaflets. At least mild aortic stenosis. The valve is very calcified but mean gradient only 9 mmHg. Mean gradient: 9mm Hg (S). Peak gradient: 17mm Hg (S). - Mitral valve: Mild regurgitation. - Left atrium: The atrium was mildly dilated. - Right ventricle: The cavity size was normal. Pacer wire or catheter noted in right ventricle. Systolic function  was normal. - Tricuspid  valve: Peak RV-RA gradient: 23mm Hg (S). - Pulmonary arteries: PA peak pressure: 28mm Hg (S). - Inferior vena cava: The vessel was normal in size; the respirophasic diameter changes were in the normal range (= 50%); findings are consistent with normal central venous pressure.    Past Medical History  Diagnosis Date  . Lumbar back pain   . Other urinary incontinence   . Memory loss   . DJD (degenerative joint disease)   . Depression   . Hypertension   . CVA (cerebral infarction)   . CHF (congestive heart failure)     Chronic Diastolic  . CAD (coronary artery disease)   . Presence of permanent cardiac pacemaker   . Osteoporosis   . Hypercholesterolemia   . Hyperthyroidism   . Anxiety   . Vertigo   . Tachy-brady syndrome   . Ischemic cardiomyopathy    Past Surgical History  Procedure Laterality Date  . Pacemaker insertion  2011  . Total knee arthroplasty  2002    bilateral  . Abdominal hysterectomy  1986  . Bilateral stenting  1999    to the RCA  . Cardiac catheterization  05/23/10  . Total abdominal hysterectomy w/ bilateral salpingoophorectomy     Social History:  reports that she has never smoked. She does not have any smokeless tobacco history on file. She reports that she does not drink alcohol or use illicit drugs.   Allergies  Allergen Reactions  . Iodine Other (See Comments)    unknown  . Iohexol      Code: HIVES, Desc: hives with nonionic cotrast 7 yrs ago ([place] iv benedryl given   . Peanut-Containing Drug Products     Family History  Problem Relation Age of Onset  . Thyroid disease Daughter   . Diabetes Other   . Hypertension Other   . Arthritis Other   . Coronary artery disease Mother 43  . Coronary artery disease Father 59  None other reported  Prior to Admission medications   Medication Sig Start Date End Date Taking? Authorizing Provider  alendronate (FOSAMAX) 70 MG tablet Take 70 mg by mouth every 7 (seven) days.  Take in the morning with a full glass of water, on an empty stomach, and do not take anything else by mouth or lie down for the next 30 min. Takes on Fridays   Yes Historical Provider, MD  Ascorbic Acid (VITAMIN C) 500 MG tablet Take 500 mg by mouth daily.     Yes Historical Provider, MD  aspirin EC 81 MG tablet Take 81 mg by mouth daily.   Yes Historical Provider, MD  Calcium Carbonate-Vit D-Min 600-400 MG-UNIT TABS Take by mouth 2 (two) times daily.     Yes Historical Provider, MD  clopidogrel (PLAVIX) 75 MG tablet Take 75 mg by mouth daily.   Yes Historical Provider, MD  furosemide (LASIX) 40 MG tablet Take one tablet once a day for edema 09/09/12  Yes Tiffany L Reed, DO  lisinopril (PRINIVIL,ZESTRIL) 10 MG tablet Take 10 mg by mouth daily.   Yes Historical Provider, MD  methimazole (TAPAZOLE) 5 MG tablet Take 5 mg by mouth 3 (three) times a week.   Yes Historical Provider, MD  Multiple Vitamins-Minerals (CENTRUM SILVER) tablet Take 1 tablet by mouth daily.     Yes Historical Provider, MD  nitroGLYCERIN (NITROSTAT) 0.4 MG SL tablet Place 0.4 mg under the tongue every 5 (five) minutes as needed. For chest pain    Yes Historical Provider, MD  polyethylene  glycol (GLYCOLAX) powder Take 17 g by mouth daily as needed. constipation   Yes Historical Provider, MD  simvastatin (ZOCOR) 40 MG tablet Take 40 mg by mouth every evening.   Yes Historical Provider, MD  spironolactone (ALDACTONE) 25 MG tablet Take 25 mg by mouth daily.   Yes Historical Provider, MD   Physical Exam: Filed Vitals:   02/18/13 0608  BP: 125/68  Pulse: 74  Temp: 98.2 F (36.8 C)  Resp: 18     General:  Alert , NAD, follow all commands   Eyes: EOMI, non icteric  Cardiovascular: RRR, no MRG  Respiratory: CTA BL, no wheezes  Abdomen: soft, obese, NT  Skin: warm and dry, pacer present in the left upper chest wall  Musculoskeletal: no cyanosis or clubbing  Neurologic: Pupils equal reactive to light and accommodation,  cranial nerves II through XII are intact, right upper extremity strength 4/5 (patient stated chronic), left upper extremity 5/5, bilateral lower extremity 5/5, sensation intact throughout,     Labs on Admission:  Basic Metabolic Panel:  Recent Labs Lab 02/13/13 2002 02/14/13 0500  NA 139 140  K 4.0 4.0  CL 98 101  CO2 32 29  GLUCOSE 90 111*  BUN 21 19  CREATININE 0.94 0.79  CALCIUM 10.1 9.7  MG  --  2.3  PHOS  --  4.1   Liver Function Tests: No results found for this basename: AST, ALT, ALKPHOS, BILITOT, PROT, ALBUMIN,  in the last 168 hours No results found for this basename: LIPASE, AMYLASE,  in the last 168 hours No results found for this basename: AMMONIA,  in the last 168 hours CBC:  Recent Labs Lab 02/13/13 2002 02/14/13 0500  WBC 9.3 8.8  HGB 14.0 13.8  HCT 41.5 40.2  MCV 89.6 89.5  PLT 240 240   Cardiac Enzymes:  Recent Labs Lab 02/13/13 2002  TROPONINI <0.30    BNP (last 3 results) No results found for this basename: PROBNP,  in the last 8760 hours CBG: No results found for this basename: GLUCAP,  in the last 168 hours  Radiological Exams on Admission: No results found.  EKG: Independently reviewed. Sinus rhythm with no ST elevation or depression  Assessment/Plan Active Problems:  1. Generalized weakness - TSH borderline level for Subherapeutic hypothyroidism; FREE T4= 1.13 which is normal - consult dietitian to assess for malnutrition as a contributing factor - Neurology on board - scheduled for CT scan on 02/15/2013 negative - Physical therapy evaluation; recommends CIR, final rehabilitation consultation for CIR pending ; ADDENDUM; Physical Medicine and Rehabilitation did not feel patient was appropriate for CIR recommended home health  - Vitamin b12 and folate levels - Continue to hold lasix; negative signs of fluid overload and renal function improving  - CT of head reported as no acute intracranial abnormality  2. CHF, suspect  diastolic HF given normal EF -- Echocardiogram complete; see results above - Continue home regimen - hold lasix as bun/creatinine ratio up and patient appears compensated.  3. HTN - stable continue home regimen  4. Hyperthyroidism - continue home regimen; Free T4= 1.13 within normal limits. Continue home regimen   5. DVT prophylaxis - SCD's while laying in bed.   Code Status: presumed full Family Communication: no family at bedside. Disposition Plan: Pending further work up  Time spent: 30 minutes  Drema Dallas [facility] Pager [phone]  If 7PM-7AM, please contact night-coverage [email or web address] Password [facility] 02/18/2013, 9:54 AM

## 2013-02-18 NOTE — Progress Notes (Signed)
Physical Therapy Treatment Patient Details Name: [name] MRN: [id number] DOB: 1932-05-26 Today's Date: 02/18/2013 Time: 9562-1308 PT Time Calculation (min): 24 min  PT Assessment / Plan / Recommendation  History of Present Illness Adm for lower extremity weakness, difficulty with gait. Pt with dementia, has old CVAs   PT Comments   Pt making progress with mobility.  Cont to be unsure about pt's d/c disposition due to observation status however if pt does not qualify for CIR she will need 24/7 care at home & if unavailable then she will need SNF.     Follow Up Recommendations  CIR;Supervision/Assistance - 24 hour     Does the patient have the potential to tolerate intense rehabilitation     Barriers to Discharge        Equipment Recommendations  Other (comment) (TBA depending on d/c plans)    Recommendations for Other Services    Frequency Min 3X/week   Progress towards PT Goals Progress towards PT goals: Progressing toward goals  Plan      Precautions / Restrictions Precautions Precautions: Fall Restrictions Weight Bearing Restrictions: No   Pertinent Vitals/Pain No pain indicated.     Mobility  Bed Mobility Bed Mobility: Rolling Right;Right Sidelying to Sit Rolling Right: 5: Supervision;With rail Right Sidelying to Sit: 5: Supervision;HOB flat;With rails Details for Bed Mobility Assistance: relied heavily on rail to push up to sitting.  Transfers Transfers: Sit to Stand;Stand to Sit Sit to Stand: 5: Supervision;With upper extremity assist;From bed;From toilet Stand to Sit: 5: Supervision;With upper extremity assist;With armrests;To toilet;To chair/3-in-1 Details for Transfer Assistance: cues for hand placement Ambulation/Gait Ambulation/Gait Assistance: 5: Supervision Ambulation Distance (Feet): 150 Feet Assistive device: Rolling walker Ambulation/Gait Assistance Details: Supervision for safety.  Pt demonstrated safe use of RW.   Pt with slow but steady  gait.   Gait Pattern: Step-through pattern;Decreased stride length Gait velocity: decreased Stairs: No Wheelchair Mobility Wheelchair Mobility: No    Exercises General Exercises - Lower Extremity Ankle Circles/Pumps: AROM;Both;10 reps Heel Slides: AROM;Strengthening;Both;10 reps Hip ABduction/ADduction: AROM;Strengthening;Both;10 reps Straight Leg Raises: AROM;Strengthening;Both;10 reps     PT Goals (current goals can now be found in the care plan section) Acute Rehab PT Goals Patient Stated Goal: stronger PT Goal Formulation: With patient Time For Goal Achievement: 02/21/13 Potential to Achieve Goals: Good  Visit Information  Last PT Received On: 02/18/13 Assistance Needed: +1 History of Present Illness: Adm for lower extremity weakness, difficulty with gait. Pt with dementia, has old CVAs    Subjective Data  Patient Stated Goal: stronger   Cognition  Cognition Arousal/Alertness: Awake/alert Behavior During Therapy: WFL for tasks assessed/performed Overall Cognitive Status: Within Functional Limits for tasks assessed Difficult to assess due to: Non-English speaking (understands more english than she speaks but does speak fair amount of english; pt speaks & understands [place] better)    Armed forces operational officer Standing Balance Static Standing - Balance Support: During functional activity;Bilateral upper extremity supported Static Standing - Level of Assistance: 5: Stand by assistance Static Standing - Comment/# of Minutes: Pt leaning fwds with bil UE's supported on sink countertop to wash face & hands  End of Session PT - End of Session Equipment Utilized During Treatment: Gait belt Activity Tolerance: Patient tolerated treatment well Patient left: in chair;with call bell/phone within reach;with chair alarm set Nurse Communication: Mobility status   GP     Lara Mulch 02/18/2013, 9:08 AM  Verdell Face, PTA [phone] 02/18/2013

## 2013-02-19 ENCOUNTER — Telehealth: Payer: Self-pay | Admitting: Internal Medicine

## 2013-02-19 NOTE — Telephone Encounter (Signed)
Spoke with ed, aware of dr Tenny Craw recommendations

## 2013-02-19 NOTE — Telephone Encounter (Signed)
Spoke with ed, according to the discharge summ the furosemide was held due to elevated Bun and Cr. The pt was told to follow up with the PCP but the son wants to make sure the pt does not need to restart the lasix. The pt is not having any SOB and has no swelling. Will forward for dr Tenny Craw review

## 2013-02-19 NOTE — Telephone Encounter (Signed)
New Problem  Pt was recently released from hospital for weakness will follow up w/ primary care// pt was advised to stop taking furosemide 40 mg tablet (lasix) Basically wants to speak with a nurse regarding medications change.

## 2013-02-19 NOTE — Telephone Encounter (Signed)
Reviewed hospital notes  Lasix was held while in hospital  Volume status appeared OK I would follow  If starts to get a little swelling in legs or SOB then call  Should resume.

## 2013-02-21 DIAGNOSIS — I509 Heart failure, unspecified: Secondary | ICD-10-CM

## 2013-02-21 DIAGNOSIS — Z8673 Personal history of transient ischemic attack (TIA), and cerebral infarction without residual deficits: Secondary | ICD-10-CM

## 2013-02-21 DIAGNOSIS — I5032 Chronic diastolic (congestive) heart failure: Secondary | ICD-10-CM

## 2013-02-21 DIAGNOSIS — IMO0001 Reserved for inherently not codable concepts without codable children: Secondary | ICD-10-CM

## 2013-03-03 ENCOUNTER — Other Ambulatory Visit: Payer: Self-pay | Admitting: Internal Medicine

## 2013-03-03 NOTE — Telephone Encounter (Signed)
Left message on voicemail for patient to return call, reason for call: requested medication is not on med list, need to discuss

## 2013-03-04 ENCOUNTER — Other Ambulatory Visit: Payer: Self-pay | Admitting: *Deleted

## 2013-03-04 ENCOUNTER — Other Ambulatory Visit: Payer: Self-pay | Admitting: Internal Medicine

## 2013-03-04 NOTE — Telephone Encounter (Signed)
Left message on voicemail for patient to return call when available   

## 2013-03-04 NOTE — Telephone Encounter (Signed)
Patient is currently holding fluid pill until seen by Cardiologist

## 2013-03-13 ENCOUNTER — Other Ambulatory Visit: Payer: Self-pay | Admitting: Internal Medicine

## 2013-03-18 ENCOUNTER — Other Ambulatory Visit: Payer: Self-pay | Admitting: Internal Medicine

## 2013-03-19 ENCOUNTER — Other Ambulatory Visit: Payer: Self-pay | Admitting: Internal Medicine

## 2013-03-24 ENCOUNTER — Other Ambulatory Visit: Payer: Self-pay | Admitting: Internal Medicine

## 2013-03-24 ENCOUNTER — Other Ambulatory Visit: Payer: Self-pay

## 2013-03-24 MED ORDER — METHIMAZOLE 5 MG PO TABS: ORAL_TABLET | ORAL | Status: DC

## 2013-03-26 ENCOUNTER — Ambulatory Visit: Payer: Medicare Other | Admitting: Internal Medicine

## 2013-04-02 ENCOUNTER — Ambulatory Visit (INDEPENDENT_AMBULATORY_CARE_PROVIDER_SITE_OTHER): Payer: Medicare Other | Admitting: Internal Medicine

## 2013-04-02 ENCOUNTER — Encounter: Payer: Self-pay | Admitting: Internal Medicine

## 2013-04-02 VITALS — BP 112/70 | HR 66 | Temp 98.4°F | Wt 180.2 lb

## 2013-04-02 DIAGNOSIS — N179 Acute kidney failure, unspecified: Secondary | ICD-10-CM

## 2013-04-02 DIAGNOSIS — I4891 Unspecified atrial fibrillation: Secondary | ICD-10-CM

## 2013-04-02 DIAGNOSIS — E0590 Thyrotoxicosis, unspecified without thyrotoxic crisis or storm: Secondary | ICD-10-CM

## 2013-04-02 DIAGNOSIS — Z23 Encounter for immunization: Secondary | ICD-10-CM

## 2013-04-02 DIAGNOSIS — F3289 Other specified depressive episodes: Secondary | ICD-10-CM

## 2013-04-02 DIAGNOSIS — M810 Age-related osteoporosis without current pathological fracture: Secondary | ICD-10-CM

## 2013-04-02 DIAGNOSIS — F329 Major depressive disorder, single episode, unspecified: Secondary | ICD-10-CM | POA: Insufficient documentation

## 2013-04-02 MED ORDER — ESCITALOPRAM OXALATE 20 MG PO TABS: 10.0000 mg | ORAL_TABLET | Freq: Every day | ORAL | Status: DC

## 2013-04-02 NOTE — Progress Notes (Signed)
Patient ID: [name], female   DOB: 07/22/1931, 79 y.o.   MRN: [id number]  Chief Complaint  Patient presents with  . Medical Managment of Chronic Issues    4 month f/u    HPI Patient here with her husband and language interpreter. She had worked well with therapy team and made improvement since last visit. After therapy completed, she stopped exercising on her own and was mostly in bed or chair. She was also hospitalized since last visit with weakness and had therapy recommended. She had ARF and her diuretics were held. She is seen in office today and mentions being tired. She also feels low in terms of her mood. She misses not being around her daughter. She has documented hx of cognitive impairment. Has cardiology follow up appointment on 04/09/13 with Dr Tenny Craw.   Review of Systems  Constitutional: Positive for fatigue. Negative for fever and chills.  HENT: Negative for congestion, sore throat and mouth sores.   Eyes: Negative for visual disturbance.  Respiratory: Negative for cough, chest tightness and shortness of breath.   Cardiovascular: Negative for chest pain, palpitations and leg swelling.  Gastrointestinal: Negative for nausea, vomiting, abdominal pain and constipation.  Genitourinary: Negative for dysuria, hematuria and vaginal discharge.  Musculoskeletal: Positive for gait problem.       Uses walker. Denies any falls/ trauma  Skin: Negative for rash.  Neurological: Negative for tremors, headache, dizziness, syncope and light-headedness.        No tinnitus  Hematological: Negative for adenopathy.  Psychiatric/Behavioral: Negative for confusion, sleep disturbance and agitation. The patient is not nervous/anxious.         Losing short term memory, long term memory is intact   Past Medical History  Diagnosis Date  . Lumbar back pain   . Other urinary incontinence   . Memory loss   . DJD (degenerative joint disease)   . Depression   . Hypertension   . CVA (cerebral  infarction)   . CHF (congestive heart failure)     Chronic Diastolic  . CAD (coronary artery disease)   . Presence of permanent cardiac pacemaker   . Osteoporosis   . Hypercholesterolemia   . Hyperthyroidism   . Anxiety   . Vertigo   . Tachy-brady syndrome   . Ischemic cardiomyopathy    Allergies  Allergen Reactions  . Iodine Other (See Comments)    unknown  . Iohexol      Code: HIVES, Desc: hives with nonionic cotrast 7 yrs ago ([place] iv benedryl given   . Peanut-Containing Drug Products    Past Surgical History  Procedure Laterality Date  . Pacemaker insertion  2011  . Total knee arthroplasty  2002    bilateral  . Abdominal hysterectomy  1986  . Bilateral stenting  1999    to the RCA  . Cardiac catheterization  05/23/10  . Total abdominal hysterectomy w/ bilateral salpingoophorectomy     Current Outpatient Prescriptions on File Prior to Visit  Medication Sig Dispense Refill  . alendronate (FOSAMAX) 70 MG tablet TAKE 1 TABLET ONCE WEEKLY TO TREAT OSTEOPOROSIS  12 tablet  1  . Ascorbic Acid (VITAMIN C) 500 MG tablet Take 500 mg by mouth daily.        Marland Kitchen aspirin EC 81 MG tablet Take 81 mg by mouth daily.      . Calcium Carbonate-Vit D-Min 600-400 MG-UNIT TABS Take by mouth 2 (two) times daily.        . clopidogrel (PLAVIX) 75  MG tablet Take 1 tablet (75 mg total) by mouth daily.  30 tablet  0  . lisinopril (PRINIVIL,ZESTRIL) 10 MG tablet Take 1 tablet (10 mg total) by mouth daily.  30 tablet  0  . methimazole (TAPAZOLE) 5 MG tablet TAKE 1 TABLET 3 DAYS A WEEK AS PRESCRIBED BY ENDOCRINOLOGY  36 tablet  0  . Multiple Vitamins-Minerals (CENTRUM SILVER) tablet Take 1 tablet by mouth daily.        . nitroGLYCERIN (NITROSTAT) 0.4 MG SL tablet Place 0.4 mg under the tongue every 5 (five) minutes as needed. For chest pain       . polyethylene glycol (GLYCOLAX) powder Take 17 g by mouth daily as needed. constipation      . simvastatin (ZOCOR) 40 MG tablet Take 40 mg by mouth every  evening.      Marland Kitchen spironolactone (ALDACTONE) 25 MG tablet Take 1 tablet (25 mg total) by mouth daily.  30 tablet  0   No current facility-administered medications on file prior to visit.    BP 112/70  Pulse 66  Temp(Src) 98.4 F (36.9 C) (Oral)  Wt 180 lb 3.2 oz (81.738 kg)  BMI 34.07 kg/m2  SpO2 98%  gen- elderly female, obese, in NAD, flat affect heent- no pallor, no icterus, no LAD, mmm, no JVD cvs- ns 1,s2, rrr, systolic murmur present respi- b/l cta, no wheeze and rhonchi, no crackles abdo- bs+, soft, non tender Breast- b/l symmetrical, pendulous breasts, no mass or palpable lump Ext- able to move all 4, uses a walker, trace pedal edema Neuro- no focal deficithas decreased b/l patellar reflex and biceps reflex, normal pinprick sensation and vibration. Has flat affect Skin- dry, has seborrhoic keratosis  Labs and imaging  CBC    Component Value Date/Time   WBC 8.8 02/14/2013 0500   WBC 7.6 11/15/2012 0936   RBC 4.49 02/14/2013 0500   RBC 4.47 11/15/2012 0936   HGB 13.8 02/14/2013 0500   HCT 40.2 02/14/2013 0500   PLT 240 02/14/2013 0500   MCV 89.5 02/14/2013 0500   MCH 30.7 02/14/2013 0500   MCH 30.2 11/15/2012 0936   MCHC 34.3 02/14/2013 0500   MCHC 33.2 11/15/2012 0936   RDW 14.9 02/14/2013 0500   RDW 14.2 11/15/2012 0936   LYMPHSABS 3.7* 11/15/2012 0936   LYMPHSABS 2.7 09/20/2011 2324   MONOABS 1.2* 09/20/2011 2324   EOSABS 0.2 11/15/2012 0936   EOSABS 0.2 09/20/2011 2324   BASOSABS 0.0 11/15/2012 0936   BASOSABS 0.0 09/20/2011 2324    CMP     Component Value Date/Time   NA 140 02/14/2013 0500   NA 142 11/15/2012 0936   K 4.0 02/14/2013 0500   CL 101 02/14/2013 0500   CO2 29 02/14/2013 0500   GLUCOSE 111* 02/14/2013 0500   GLUCOSE 103* 11/15/2012 0936   BUN 19 02/14/2013 0500   BUN 26 11/15/2012 0936   CREATININE 0.79 02/14/2013 0500   CALCIUM 9.7 02/14/2013 0500   PROT 6.5 11/15/2012 0936   PROT 6.5 09/21/2011 0532   ALBUMIN 3.5 09/21/2011 0532   AST 16 11/15/2012 0936   ALT 13  11/15/2012 0936   ALKPHOS 52 11/15/2012 0936   BILITOT 0.4 11/15/2012 0936   GFRNONAA 78* 02/14/2013 0500   GFRAA >90 02/14/2013 0500   11/20/12- MMSE- 11/30. Failed clock draw  Head CT without contrast 02/15/2013 Brain: No evidence of acute abnormality, such as acute infarction,   hemorrhage, hydrocephalus, or mass lesion/mass effect.Established,  remote appearing, infarcts in  the left cerebellum, high anterior   left frontal lobe, and inferior/medial right putamen, and left   caudate head. Extensive chronic small vessel ischemic injury with  confluent bilateral cerebral white matter low attenuation.   IMPRESSION:   1. No evidence of acute infarct.   2. Extensive chronic small vessel ischemic injury.     CT head without contrast 02/13/2013 1. No acute intracranial abnormality.   2. Stable moderate to severe generalized atrophy and severe   chronic microvascular ischemic changes of the white matter   Assessment/plan  Depression- discussed in detail with patient and family about her mood and chances for mood medication to help improve her mood overall and her energy level. No anemia. Thyroid level within normal range. Will start her on escitalopram 20 mg daily for now. Common side effects with the medication explained  Generalized weakness- her dementia, depression, chronic co-morbidities and sedentary lifestyle are all contributing to this.  To use walker, fall precautions. Encouraged activity and exercise.  Hypertension- bp normal. Reviewed medications. Continue current regimen  chf- no signs of fluid overload. To follow with cardiology. Check bmp  ARF- reassess renal function  Osteoporosis- continue fosamax with ca-vit d supplement  hyperthryoidism- continue methimazole and check tsh today  Old CVA- bp under control.continue current bp regimen and statin

## 2013-04-03 LAB — BASIC METABOLIC PANEL
BUN/Creatinine Ratio: 19 (ref 11–26)
BUN: 19 mg/dL (ref 8–27)
Chloride: 98 mmol/L (ref 97–108)
GFR calc Af Amer: 64 mL/min/{1.73_m2} (ref >59)
Potassium: 4.9 mmol/L (ref 3.5–5.2)
Sodium: 141 mmol/L (ref 134–144)

## 2013-04-09 ENCOUNTER — Ambulatory Visit (INDEPENDENT_AMBULATORY_CARE_PROVIDER_SITE_OTHER): Payer: Medicare Other | Admitting: *Deleted

## 2013-04-09 ENCOUNTER — Encounter: Payer: Self-pay | Admitting: Internal Medicine

## 2013-04-09 ENCOUNTER — Encounter (INDEPENDENT_AMBULATORY_CARE_PROVIDER_SITE_OTHER): Payer: Self-pay

## 2013-04-09 DIAGNOSIS — I495 Sick sinus syndrome: Secondary | ICD-10-CM

## 2013-04-09 DIAGNOSIS — Z950 Presence of cardiac pacemaker: Secondary | ICD-10-CM

## 2013-04-09 NOTE — Progress Notes (Signed)
Device check in clinic, all functions normal, no changes made, full details in [facility].  ROV w/ Dr. Ladona Ridgel in 54mo.

## 2013-04-11 LAB — PACEMAKER DEVICE OBSERVATION
AL IMPEDENCE PM: 450 Ohm
AL THRESHOLD: 0.75 V
ATRIAL PACING PM: 61
RV LEAD THRESHOLD: 0.75 V
VENTRICULAR PACING PM: 2

## 2013-04-14 ENCOUNTER — Telehealth: Payer: Self-pay

## 2013-04-14 ENCOUNTER — Other Ambulatory Visit: Payer: Self-pay | Admitting: Internal Medicine

## 2013-04-14 NOTE — Telephone Encounter (Signed)
Patients' husband called about refill on her spironolactone, i told him that it was refilled this morning

## 2013-04-30 ENCOUNTER — Ambulatory Visit (INDEPENDENT_AMBULATORY_CARE_PROVIDER_SITE_OTHER): Payer: Medicare Other | Admitting: Internal Medicine

## 2013-04-30 ENCOUNTER — Encounter: Payer: Self-pay | Admitting: Internal Medicine

## 2013-04-30 VITALS — BP 118/80 | HR 79 | Temp 98.6°F | Wt 182.4 lb

## 2013-04-30 DIAGNOSIS — F329 Major depressive disorder, single episode, unspecified: Secondary | ICD-10-CM

## 2013-04-30 DIAGNOSIS — R42 Dizziness and giddiness: Secondary | ICD-10-CM

## 2013-04-30 MED ORDER — ESCITALOPRAM OXALATE 20 MG PO TABS: 10.0000 mg | ORAL_TABLET | Freq: Every day | ORAL | Status: DC

## 2013-04-30 MED ORDER — ESCITALOPRAM OXALATE 20 MG PO TABS: ORAL_TABLET | ORAL | Status: DC

## 2013-04-30 NOTE — Progress Notes (Signed)
Patient ID: [name], female   DOB: 1931/10/25, 79 y.o.   MRN: [id number]  Chief Complaint  Patient presents with  . Depression  . Dizziness   Allergies  Allergen Reactions  . Iodine Other (See Comments)    unknown  . Iohexol      Code: HIVES, Desc: hives with nonionic cotrast 7 yrs ago ([place] iv benedryl given   . Peanut-Containing Drug Products     HPI Patient here with her husband and language interpreter. She has been having episode of dizziness with occassional spinning of room for few weeks. Her mood has somewhat improved but continues to be by herself and lacks participation in activities. She has documented hx of cognitive impairment. Has cardiology follow up appointment on 04/09/13 with Dr Tenny Craw.  Review of Systems   Constitutional: Negative for fever and chills.  HENT: Negative for congestion, sore throat and mouth sores.    Eyes: Negative for visual disturbance.   Respiratory: Negative for cough, chest tightness and shortness of breath.    Cardiovascular: Negative for chest pain, palpitations and leg swelling.   Gastrointestinal: Negative for nausea, vomiting, abdominal pain and constipation.   Genitourinary: Negative for dysuria, hematuria and vaginal discharge.   Musculoskeletal: Positive for gait problem.       Uses walker. Denies any falls/ trauma   Skin: Negative for rash.   Neurological: Negative for tremors, headache, dizziness, syncope and light-headedness.        No tinnitus  Hematological: Negative for adenopathy.   Psychiatric/Behavioral: Negative for confusion, sleep disturbance and agitation. The patient is not nervous/anxious.         Losing short term memory, long term memory is intact    Assessment/plan  Depression- will change her citalopram to 20 mg po daily for now and reassess next visit. Common side effects with the medication explained  Dizziness- with her hx of CVA in past she could be having autonomic dysfunction contributing some.  Ruled out orthostatic changes this visit. Lying down- bp 122/80, HR 61/min: sitting 126/84, HR 60/min: standing bp 134/80, HR 64/min. She is on spironolactone and lisinopril and her bp appears well controlled. Iatrogenic dizziness is another possibility. May be her lisinopril dose could be decreased. Will have her cardiologist Dr Tenny Craw review on this

## 2013-06-02 ENCOUNTER — Other Ambulatory Visit: Payer: Self-pay | Admitting: Internal Medicine

## 2013-07-02 ENCOUNTER — Other Ambulatory Visit: Payer: Self-pay

## 2013-07-02 ENCOUNTER — Other Ambulatory Visit: Payer: Self-pay | Admitting: Internal Medicine

## 2013-07-02 MED ORDER — SPIRONOLACTONE 25 MG PO TABS: ORAL_TABLET | ORAL | Status: DC

## 2013-07-07 ENCOUNTER — Other Ambulatory Visit: Payer: Self-pay | Admitting: Internal Medicine

## 2013-07-23 ENCOUNTER — Other Ambulatory Visit: Payer: Self-pay | Admitting: Internal Medicine

## 2013-07-24 ENCOUNTER — Other Ambulatory Visit: Payer: Self-pay | Admitting: Internal Medicine

## 2013-07-29 ENCOUNTER — Other Ambulatory Visit: Payer: Self-pay | Admitting: Internal Medicine

## 2013-08-07 ENCOUNTER — Other Ambulatory Visit: Payer: Self-pay | Admitting: Internal Medicine

## 2013-08-25 ENCOUNTER — Other Ambulatory Visit: Payer: Self-pay | Admitting: Internal Medicine

## 2013-08-26 ENCOUNTER — Encounter: Payer: Self-pay | Admitting: Internal Medicine

## 2013-08-26 ENCOUNTER — Ambulatory Visit (INDEPENDENT_AMBULATORY_CARE_PROVIDER_SITE_OTHER): Payer: Medicare Other | Admitting: Internal Medicine

## 2013-08-26 VITALS — BP 130/78 | HR 77 | Temp 98.3°F | Resp 10 | Wt 185.0 lb

## 2013-08-26 DIAGNOSIS — R5383 Other fatigue: Secondary | ICD-10-CM

## 2013-08-26 DIAGNOSIS — F0150 Vascular dementia without behavioral disturbance: Secondary | ICD-10-CM

## 2013-08-26 DIAGNOSIS — E0590 Thyrotoxicosis, unspecified without thyrotoxic crisis or storm: Secondary | ICD-10-CM

## 2013-08-26 DIAGNOSIS — F32A Depression, unspecified: Secondary | ICD-10-CM

## 2013-08-26 DIAGNOSIS — F329 Major depressive disorder, single episode, unspecified: Secondary | ICD-10-CM

## 2013-08-26 DIAGNOSIS — I10 Essential (primary) hypertension: Secondary | ICD-10-CM

## 2013-08-26 DIAGNOSIS — R5381 Other malaise: Secondary | ICD-10-CM

## 2013-08-26 DIAGNOSIS — F3289 Other specified depressive episodes: Secondary | ICD-10-CM

## 2013-08-26 DIAGNOSIS — M810 Age-related osteoporosis without current pathological fracture: Secondary | ICD-10-CM

## 2013-08-26 MED ORDER — LISINOPRIL 10 MG PO TABS: 10.0000 mg | ORAL_TABLET | Freq: Every day | ORAL | Status: DC

## 2013-08-26 MED ORDER — ALENDRONATE SODIUM 70 MG PO TABS: ORAL_TABLET | ORAL | Status: DC

## 2013-08-26 MED ORDER — METHIMAZOLE 5 MG PO TABS: ORAL_TABLET | ORAL | Status: DC

## 2013-08-26 NOTE — Progress Notes (Signed)
Patient ID: [name], female   DOB: 1931/10/31, 80 y.o.   MRN: [id number]    Chief Complaint  Patient presents with  . Medical Managment of Chronic Issues    4 month follow-up    Allergies  Allergen Reactions  . Iodine Other (See Comments)    unknown  . Iohexol      Code: HIVES, Desc: hives with nonionic cotrast 7 yrs ago ([place] iv benedryl given   . Peanut-Containing Drug Products    HPI 80 y/o female pt is here for routine follow up. She has dementia. She has gained some wreight Needs refills on methimazole Her husband and interpreter are present this visit She denies any complaints Husband mentions her being inactive. Her appetite is good. Sleeps a lot Uses a walker to move around No falls reported  Review of Systems   Constitutional: Negative for fever and chills.  HENT: Negative for congestion, sore throat and mouth sores.    Eyes: Negative for visual disturbance.   Respiratory: Negative for cough, chest tightness and shortness of breath.    Cardiovascular: Negative for chest pain, palpitations and leg swelling.   Gastrointestinal: Negative for nausea, vomiting, abdominal pain and constipation.   Genitourinary: Negative for dysuria, hematuria and vaginal discharge.   Musculoskeletal: Uses walker. Denies any falls/ trauma   Skin: Negative for rash.   Neurological: Negative for tremors, headache, dizziness, syncope and light-headedness.   Hematological: Negative for adenopathy.   Psychiatric/Behavioral: Negative for confusion, sleep disturbance and agitation. The patient is not nervous/anxious. Losing short term memory, long term memory is intact   Past Medical History  Diagnosis Date  . Lumbar back pain   . Other urinary incontinence   . Memory loss   . DJD (degenerative joint disease)   . Depression   . Hypertension   . CVA (cerebral infarction)   . CHF (congestive heart failure)     Chronic Diastolic  . CAD (coronary artery disease)   . Presence of  permanent cardiac pacemaker   . Osteoporosis   . Hypercholesterolemia   . Hyperthyroidism   . Anxiety   . Vertigo   . Tachy-brady syndrome   . Ischemic cardiomyopathy    Current Outpatient Prescriptions on File Prior to Visit  Medication Sig Dispense Refill  . Ascorbic Acid (VITAMIN C) 500 MG tablet Take 500 mg by mouth daily.        . Calcium Carbonate-Vit D-Min 600-400 MG-UNIT TABS Take by mouth 2 (two) times daily.        . clopidogrel (PLAVIX) 75 MG tablet TAKE 1 TABLET BY MOUTH EVERY DAY  30 tablet  3  . CVS ASPIRIN LOW DOSE 81 MG EC tablet TAKE 1 TABLET (81 MG TOTAL) BY MOUTH DAILY. SWALLOW WHOLE.  30 tablet  6  . Multiple Vitamins-Minerals (CENTRUM SILVER) tablet Take 1 tablet by mouth daily.        . nitroGLYCERIN (NITROSTAT) 0.4 MG SL tablet Place 0.4 mg under the tongue every 5 (five) minutes as needed. For chest pain       . polyethylene glycol (GLYCOLAX) powder Take 17 g by mouth daily as needed. constipation      . simvastatin (ZOCOR) 40 MG tablet TAKE 1 TABLET EVERY DAY  30 tablet  3  . spironolactone (ALDACTONE) 25 MG tablet TAKE 1 TABLET (25 MG TOTAL) BY MOUTH DAILY.  30 tablet  6   No current facility-administered medications on file prior to visit.    Past Surgical History  Procedure Laterality Date  . Pacemaker insertion  2011  . Total knee arthroplasty  2002    bilateral  . Abdominal hysterectomy  1986  . Bilateral stenting  1999    to the RCA  . Cardiac catheterization  05/23/10  . Total abdominal hysterectomy w/ bilateral salpingoophorectomy      Physical exam BP 130/78  Pulse 77  Temp(Src) 98.3 F (36.8 C) (Oral)  Resp 10  Wt 185 lb (83.915 kg)  SpO2 95%  gen- elderly female, obese, in NAD, flat affect heent- no pallor, no icterus, no LAD, mmm, no JVD cvs- ns 1,s2, rrr, systolic murmur present respi- b/l cta, no wheeze and rhonchi, no crackles abdo- bs+, soft, non tender Breast- b/l symmetrical, pendulous breasts, no mass or palpable lump Ext-  able to move all 4, uses a walker, trace pedal edema Neuro- no focal deficit  Skin- dry, has seborrhoic keratosis  Labs- CBC    Component Value Date/Time   WBC 8.8 02/14/2013 0500   WBC 7.6 11/15/2012 0936   RBC 4.49 02/14/2013 0500   RBC 4.47 11/15/2012 0936   HGB 13.8 02/14/2013 0500   HCT 40.2 02/14/2013 0500   PLT 240 02/14/2013 0500   MCV 89.5 02/14/2013 0500   MCH 30.7 02/14/2013 0500   MCH 30.2 11/15/2012 0936   MCHC 34.3 02/14/2013 0500   MCHC 33.2 11/15/2012 0936   RDW 14.9 02/14/2013 0500   RDW 14.2 11/15/2012 0936   LYMPHSABS 3.7* 11/15/2012 0936   LYMPHSABS 2.7 09/20/2011 2324   MONOABS 1.2* 09/20/2011 2324   EOSABS 0.2 11/15/2012 0936   EOSABS 0.2 09/20/2011 2324   BASOSABS 0.0 11/15/2012 0936   BASOSABS 0.0 09/20/2011 2324    CMP     Component Value Date/Time   NA 142 08/26/2013 1413   NA 140 02/14/2013 0500   K 5.0 08/26/2013 1413   CL 98 08/26/2013 1413   CO2 26 08/26/2013 1413   GLUCOSE 81 08/26/2013 1413   GLUCOSE 111* 02/14/2013 0500   BUN 19 08/26/2013 1413   BUN 19 02/14/2013 0500   CREATININE 0.86 08/26/2013 1413   CALCIUM 10.2 08/26/2013 1413   PROT 6.5 11/15/2012 0936   PROT 6.5 09/21/2011 0532   ALBUMIN 3.5 09/21/2011 0532   AST 16 11/15/2012 0936   ALT 13 11/15/2012 0936   ALKPHOS 52 11/15/2012 0936   BILITOT 0.4 11/15/2012 0936   GFRNONAA 64 08/26/2013 1413   GFRAA 74 08/26/2013 1413    Assessment/plan  1. Vascular dementia Slow decline in memory. Encouraged family to enroll patient in adult enrichment program. Family have mentioned pt being more social when with new group of people/ environment. Family wants her to go for physical theray. Outpatient PT referral provided. Continue statin and aspirin - Basic Metabolic Panel - Ambulatory referral to Physical Therapy  2. HYPERTENSION bp under control. Continue lisinopril and spironolactone.  - Basic Metabolic Panel  3. HYPERTHYROIDISM Continue methimazole. Refill provided - TSH - Basic Metabolic Panel  4.  OSTEOPOROSIS Continue ca-vit d supplement , use walker, fall precautions. Tolerating fosamax well  5. Depression Antidepressants tried in the past. Pt stopped it due to side effects. Does not want to try anything for now. Advised family to monitor for behavioral changes  6. Other malaise and fatigue Her progressive dementia and depression are likely contributing to this. Adult enrichment program may be helpful. Family willing to take her for therapy sessions and see if this will help with her activities. - Ambulatory referral to  Physical Therapy

## 2013-08-27 ENCOUNTER — Encounter: Payer: Self-pay | Admitting: *Deleted

## 2013-08-27 ENCOUNTER — Other Ambulatory Visit: Payer: Self-pay | Admitting: Internal Medicine

## 2013-08-27 LAB — BASIC METABOLIC PANEL
BUN / CREAT RATIO: 22 (ref 11–26)
BUN: 19 mg/dL (ref 8–27)
CHLORIDE: 98 mmol/L (ref 97–108)
CO2: 26 mmol/L (ref 18–29)
Calcium: 10.2 mg/dL (ref 8.7–10.3)
Creatinine, Ser: 0.86 mg/dL (ref 0.57–1.00)
GFR, EST AFRICAN AMERICAN: 74 mL/min/{1.73_m2} (ref >59)
GFR, EST NON AFRICAN AMERICAN: 64 mL/min/{1.73_m2} (ref >59)
GLUCOSE: 81 mg/dL (ref 65–99)
Potassium: 5 mmol/L (ref 3.5–5.2)
Sodium: 142 mmol/L (ref 134–144)

## 2013-08-27 LAB — TSH: TSH: 0.591 u[IU]/mL (ref 0.450–4.500)

## 2013-09-03 ENCOUNTER — Ambulatory Visit: Payer: Medicare Other | Attending: Internal Medicine

## 2013-09-03 DIAGNOSIS — R262 Difficulty in walking, not elsewhere classified: Secondary | ICD-10-CM | POA: Insufficient documentation

## 2013-09-03 DIAGNOSIS — R5381 Other malaise: Secondary | ICD-10-CM | POA: Insufficient documentation

## 2013-09-03 DIAGNOSIS — M6281 Muscle weakness (generalized): Secondary | ICD-10-CM | POA: Insufficient documentation

## 2013-09-03 DIAGNOSIS — IMO0001 Reserved for inherently not codable concepts without codable children: Secondary | ICD-10-CM | POA: Insufficient documentation

## 2013-09-09 ENCOUNTER — Ambulatory Visit: Payer: Medicare Other | Admitting: Physical Therapy

## 2013-09-11 ENCOUNTER — Ambulatory Visit: Payer: Medicare Other | Admitting: Physical Therapy

## 2013-09-16 ENCOUNTER — Ambulatory Visit: Payer: Medicare Other | Admitting: Physical Therapy

## 2013-09-18 ENCOUNTER — Ambulatory Visit: Payer: Medicare Other | Attending: Internal Medicine | Admitting: Physical Therapy

## 2013-09-18 DIAGNOSIS — M6281 Muscle weakness (generalized): Secondary | ICD-10-CM | POA: Insufficient documentation

## 2013-09-18 DIAGNOSIS — IMO0001 Reserved for inherently not codable concepts without codable children: Secondary | ICD-10-CM | POA: Insufficient documentation

## 2013-09-18 DIAGNOSIS — R262 Difficulty in walking, not elsewhere classified: Secondary | ICD-10-CM | POA: Insufficient documentation

## 2013-09-18 DIAGNOSIS — R5381 Other malaise: Secondary | ICD-10-CM | POA: Insufficient documentation

## 2013-09-23 ENCOUNTER — Ambulatory Visit: Payer: Medicare Other | Admitting: Physical Therapy

## 2013-09-25 ENCOUNTER — Ambulatory Visit: Payer: Medicare Other | Admitting: Physical Therapy

## 2013-09-30 ENCOUNTER — Ambulatory Visit: Payer: Medicare Other | Admitting: Physical Therapy

## 2013-10-02 ENCOUNTER — Ambulatory Visit: Payer: Medicare Other | Admitting: Physical Therapy

## 2013-10-07 ENCOUNTER — Ambulatory Visit: Payer: Medicare Other | Admitting: Physical Therapy

## 2013-10-09 ENCOUNTER — Ambulatory Visit: Payer: Medicare Other | Admitting: Physical Therapy

## 2013-10-14 ENCOUNTER — Ambulatory Visit: Payer: Medicare Other | Admitting: Physical Therapy

## 2013-10-16 ENCOUNTER — Ambulatory Visit: Payer: Medicare Other | Admitting: Physical Therapy

## 2013-10-17 ENCOUNTER — Ambulatory Visit (INDEPENDENT_AMBULATORY_CARE_PROVIDER_SITE_OTHER): Payer: Medicare Other | Admitting: Internal Medicine

## 2013-10-17 ENCOUNTER — Encounter: Payer: Self-pay | Admitting: Internal Medicine

## 2013-10-17 VITALS — BP 110/68 | HR 67 | Ht 60.0 in | Wt 186.4 lb

## 2013-10-17 DIAGNOSIS — I10 Essential (primary) hypertension: Secondary | ICD-10-CM

## 2013-10-17 DIAGNOSIS — I4891 Unspecified atrial fibrillation: Secondary | ICD-10-CM

## 2013-10-17 LAB — MDC_IDC_ENUM_SESS_TYPE_INCLINIC
Battery Impedance: 133 Ohm
Battery Remaining Longevity: 138 mo
Battery Voltage: 2.79 V
Brady Statistic AP VP Percent: 1 %
Lead Channel Impedance Value: 457 Ohm
Lead Channel Sensing Intrinsic Amplitude: 4 mV
Lead Channel Setting Pacing Amplitude: 2 V
Lead Channel Setting Pacing Pulse Width: 0.4 ms
MDC IDC MSMT LEADCHNL RA PACING THRESHOLD AMPLITUDE: 0.75 V
MDC IDC MSMT LEADCHNL RA PACING THRESHOLD PULSEWIDTH: 0.4 ms
MDC IDC MSMT LEADCHNL RV IMPEDANCE VALUE: 694 Ohm
MDC IDC MSMT LEADCHNL RV PACING THRESHOLD AMPLITUDE: 0.75 V
MDC IDC MSMT LEADCHNL RV PACING THRESHOLD PULSEWIDTH: 0.4 ms
MDC IDC MSMT LEADCHNL RV SENSING INTR AMPL: 11.2 mV
MDC IDC SESS DTM: 20150501111133
MDC IDC SET LEADCHNL RV PACING AMPLITUDE: 2.5 V
MDC IDC SET LEADCHNL RV SENSING SENSITIVITY: 4 mV
MDC IDC STAT BRADY AP VS PERCENT: 67 %
MDC IDC STAT BRADY AS VP PERCENT: 1 %
MDC IDC STAT BRADY AS VS PERCENT: 31 %

## 2013-10-17 NOTE — Assessment & Plan Note (Signed)
She is maintaining NSR 96% of the time. Will continue current meds. We discussed the possibility of starting anti-arrhythmic medications but will hold off on this unless her atrial fibrillation burden worsens or she becomes more symptomatic.

## 2013-10-17 NOTE — Progress Notes (Signed)
HPI [name] returns today for followup. She is a pleasant 80 yo woman with a h/o stroke, symptomatic bradycardia, paroxysmal atrial fibrillation, status post pacemaker insertion. The patient has had no recurrent syncope or falls. She complains of generalized malaise. She denies palpitations. No peripheral edema. She denies dyspnea or chest pain. She is quite sedentary. She is not on anti-coagulation because of problems with falls. Allergies  Allergen Reactions  . Iodine Other (See Comments)    unknown  . Iohexol      Code: HIVES, Desc: hives with nonionic contrast 7 yrs ago ([place] iv benedryl given   . Peanut-Containing Drug Products      Current Outpatient Prescriptions  Medication Sig Dispense Refill  . alendronate (FOSAMAX) 70 MG tablet Due for BONE DENSITY, Take 1 tablet by mouth once weekly to treat Osteoporosis      . Ascorbic Acid (VITAMIN C) 500 MG tablet Take 500 mg by mouth daily.        . Calcium Carbonate-Vit D-Min 600-400 MG-UNIT TABS Take 1 tablet by mouth 2 (two) times daily.       . clopidogrel (PLAVIX) 75 MG tablet TAKE 1 TABLET BY MOUTH EVERY DAY  30 tablet  3  . CVS ASPIRIN LOW DOSE 81 MG EC tablet TAKE 1 TABLET (81 MG TOTAL) BY MOUTH DAILY. SWALLOW WHOLE.  30 tablet  6  . lisinopril (PRINIVIL,ZESTRIL) 10 MG tablet Take 1 tablet (10 mg total) by mouth daily.  30 tablet  0  . methimazole (TAPAZOLE) 5 MG tablet Take one tablet by mouth three days a week as prescribed by endocrinology  12 tablet  3  . Multiple Vitamins-Minerals (CENTRUM SILVER) tablet Take 1 tablet by mouth daily.        . nitroGLYCERIN (NITROSTAT) 0.4 MG SL tablet Place 0.4 mg under the tongue every 5 (five) minutes as needed. For chest pain       . polyethylene glycol (GLYCOLAX) powder Take 17 g by mouth daily as needed. constipation      . simvastatin (ZOCOR) 40 MG tablet TAKE 1 TABLET EVERY DAY  30 tablet  3  . spironolactone (ALDACTONE) 25 MG tablet TAKE 1 TABLET (25 MG TOTAL) BY MOUTH DAILY.  30  tablet  6   No current facility-administered medications for this visit.     Past Medical History  Diagnosis Date  . Lumbar back pain   . Other urinary incontinence   . Memory loss   . DJD (degenerative joint disease)   . Depression   . Hypertension   . CVA (cerebral infarction)   . CHF (congestive heart failure)     Chronic Diastolic  . CAD (coronary artery disease)   . Presence of permanent cardiac pacemaker   . Osteoporosis   . Hypercholesterolemia   . Hyperthyroidism   . Anxiety   . Vertigo   . Tachy-brady syndrome   . Ischemic cardiomyopathy     ROS:   All systems reviewed and negative except as noted in the HPI.   Past Surgical History  Procedure Laterality Date  . Pacemaker insertion  2011  . Total knee arthroplasty  2002    bilateral  . Abdominal hysterectomy  1986  . Bilateral stenting  1999    to the RCA  . Cardiac catheterization  05/23/10  . Total abdominal hysterectomy w/ bilateral salpingoophorectomy       Family History  Problem Relation Age of Onset  . Thyroid disease Daughter   . Diabetes Other   .  Hypertension Other   . Arthritis Other   . Coronary artery disease Mother [id number]  . Coronary artery disease Father [id number]     History   Social History  . Marital Status: Married    Spouse Name: N/A    Number of Children: N/A  . Years of Education: N/A   Occupational History  . Not on file.   Social History Main Topics  . Smoking status: Never Smoker   . Smokeless tobacco: Not on file  . Alcohol Use: No  . Drug Use: No  . Sexual Activity: Not on file   Other Topics Concern  . Not on file   Social History Narrative  . No narrative on file     BP 110/68  Pulse 67  Ht 5' (1.524 m)  Wt 186 lb 6.4 oz (84.55 kg)  BMI 36.40 kg/m2  Physical Exam:  Obese, stable appearing 80 year old woman, NAD HEENT: Unremarkable Neck:  7 cm JVD, no thyromegally Back:  No CVA tenderness Lungs:  Clear with no wheezes, rales, or rhonchi. HEART:   Regular rate rhythm, no murmurs, no rubs, no clicks Abd:  soft, positive bowel sounds, no organomegally, no rebound, no guarding Ext:  2 plus pulses, no edema, no cyanosis, no clubbing Skin:  No rashes no nodules Neuro:  CN II through XII intact, motor grossly intact DEVICE  Normal device function.  See PaceArt for details.   Assess/Plan:

## 2013-10-17 NOTE — Patient Instructions (Signed)
Your physician wants you to follow-up in: 6 months with device clinic and 12 months with Dr Taylor You will receive a reminder letter in the mail two months in advance. If you don't receive a letter, please call our office to schedule the follow-up appointment.  

## 2013-10-17 NOTE — Assessment & Plan Note (Signed)
Her blood pressure is very well controlled. No change in meds.

## 2013-10-21 ENCOUNTER — Ambulatory Visit: Payer: Medicare Other | Attending: Internal Medicine | Admitting: Physical Therapy

## 2013-10-21 DIAGNOSIS — R262 Difficulty in walking, not elsewhere classified: Secondary | ICD-10-CM | POA: Insufficient documentation

## 2013-10-21 DIAGNOSIS — R5381 Other malaise: Secondary | ICD-10-CM | POA: Insufficient documentation

## 2013-10-21 DIAGNOSIS — M6281 Muscle weakness (generalized): Secondary | ICD-10-CM | POA: Insufficient documentation

## 2013-10-21 DIAGNOSIS — IMO0001 Reserved for inherently not codable concepts without codable children: Secondary | ICD-10-CM | POA: Insufficient documentation

## 2013-10-22 ENCOUNTER — Encounter: Payer: Self-pay | Admitting: Internal Medicine

## 2013-10-23 ENCOUNTER — Ambulatory Visit: Payer: Medicare Other | Admitting: Physical Therapy

## 2013-10-24 ENCOUNTER — Other Ambulatory Visit: Payer: Self-pay | Admitting: Internal Medicine

## 2013-11-03 ENCOUNTER — Telehealth: Payer: Self-pay

## 2013-11-03 DIAGNOSIS — F0150 Vascular dementia without behavioral disturbance: Secondary | ICD-10-CM

## 2013-11-03 DIAGNOSIS — I509 Heart failure, unspecified: Secondary | ICD-10-CM

## 2013-11-03 DIAGNOSIS — R269 Unspecified abnormalities of gait and mobility: Secondary | ICD-10-CM

## 2013-11-03 NOTE — Telephone Encounter (Signed)
Message left on triage voicemail: Please call to discuss a referral for home health  I called patient's son (Ed). Ed is requesting that Dr.Pandey place a referral for home health. Patient used [name] in the past for physical therapy. Ed would like for patient to have help with bathing and to be evaluated for any other qualifying needs.   Dr.Pandey please advise. Patient's son would like to make sure his number [phone] is listed as the contact number on the referral.

## 2013-11-04 ENCOUNTER — Ambulatory Visit: Payer: Medicare Other | Admitting: Physical Therapy

## 2013-11-04 NOTE — Telephone Encounter (Signed)
Ok to go ahead and provide referral for home health services to evaluate and assist with ADLs

## 2013-11-05 NOTE — Telephone Encounter (Signed)
Order placed

## 2013-11-06 ENCOUNTER — Ambulatory Visit: Payer: Medicare Other | Admitting: Physical Therapy

## 2013-11-11 ENCOUNTER — Encounter: Payer: Medicare Other | Admitting: Physical Therapy

## 2013-11-12 ENCOUNTER — Ambulatory Visit: Payer: Medicare Other | Admitting: Physical Therapy

## 2013-11-13 ENCOUNTER — Ambulatory Visit: Payer: Medicare Other | Admitting: Physical Therapy

## 2013-11-18 ENCOUNTER — Ambulatory Visit: Payer: Medicare Other | Attending: Internal Medicine | Admitting: Physical Therapy

## 2013-11-18 DIAGNOSIS — R262 Difficulty in walking, not elsewhere classified: Secondary | ICD-10-CM | POA: Insufficient documentation

## 2013-11-18 DIAGNOSIS — IMO0001 Reserved for inherently not codable concepts without codable children: Secondary | ICD-10-CM | POA: Insufficient documentation

## 2013-11-18 DIAGNOSIS — M6281 Muscle weakness (generalized): Secondary | ICD-10-CM | POA: Insufficient documentation

## 2013-11-18 DIAGNOSIS — R5381 Other malaise: Secondary | ICD-10-CM | POA: Insufficient documentation

## 2013-11-20 ENCOUNTER — Ambulatory Visit: Payer: Medicare Other | Admitting: Physical Therapy

## 2013-11-25 ENCOUNTER — Ambulatory Visit: Payer: Medicare Other | Admitting: Physical Therapy

## 2013-11-26 ENCOUNTER — Other Ambulatory Visit: Payer: Self-pay | Admitting: Internal Medicine

## 2013-11-27 ENCOUNTER — Other Ambulatory Visit: Payer: Self-pay | Admitting: Internal Medicine

## 2013-11-27 ENCOUNTER — Encounter: Payer: Medicare Other | Admitting: Physical Therapy

## 2013-12-02 ENCOUNTER — Encounter: Payer: Medicare Other | Admitting: Physical Therapy

## 2013-12-16 ENCOUNTER — Encounter: Payer: Medicare Other | Admitting: Physical Therapy

## 2013-12-18 ENCOUNTER — Encounter: Payer: Medicare Other | Admitting: Physical Therapy

## 2013-12-24 ENCOUNTER — Other Ambulatory Visit: Payer: Self-pay | Admitting: Internal Medicine

## 2013-12-25 ENCOUNTER — Other Ambulatory Visit: Payer: Self-pay | Admitting: *Deleted

## 2013-12-25 MED ORDER — ASPIRIN 81 MG PO TBEC: DELAYED_RELEASE_TABLET | ORAL | Status: DC

## 2013-12-26 ENCOUNTER — Other Ambulatory Visit: Payer: Self-pay | Admitting: *Deleted

## 2013-12-26 MED ORDER — SIMVASTATIN 40 MG PO TABS: ORAL_TABLET | ORAL | Status: DC

## 2014-01-01 ENCOUNTER — Other Ambulatory Visit: Payer: Self-pay | Admitting: *Deleted

## 2014-01-01 DIAGNOSIS — R2681 Unsteadiness on feet: Secondary | ICD-10-CM

## 2014-01-01 NOTE — Telephone Encounter (Signed)
Patient son called and stated that he had called back in [name] and wanted a referral for [facility] for patient. Placed another order and informed Nicole Cella. Patient Aware.

## 2014-01-03 DIAGNOSIS — I10 Essential (primary) hypertension: Secondary | ICD-10-CM

## 2014-01-03 DIAGNOSIS — F0150 Vascular dementia without behavioral disturbance: Secondary | ICD-10-CM

## 2014-01-03 DIAGNOSIS — I69919 Unspecified symptoms and signs involving cognitive functions following unspecified cerebrovascular disease: Secondary | ICD-10-CM

## 2014-01-03 DIAGNOSIS — I509 Heart failure, unspecified: Secondary | ICD-10-CM

## 2014-01-07 ENCOUNTER — Ambulatory Visit (INDEPENDENT_AMBULATORY_CARE_PROVIDER_SITE_OTHER): Payer: Medicare Other | Admitting: Internal Medicine

## 2014-01-07 ENCOUNTER — Encounter: Payer: Self-pay | Admitting: Internal Medicine

## 2014-01-07 VITALS — BP 118/76 | HR 86 | Temp 97.7°F | Wt 182.0 lb

## 2014-01-07 DIAGNOSIS — F329 Major depressive disorder, single episode, unspecified: Secondary | ICD-10-CM

## 2014-01-07 DIAGNOSIS — M810 Age-related osteoporosis without current pathological fracture: Secondary | ICD-10-CM

## 2014-01-07 DIAGNOSIS — E7800 Pure hypercholesterolemia, unspecified: Secondary | ICD-10-CM

## 2014-01-07 DIAGNOSIS — F3289 Other specified depressive episodes: Secondary | ICD-10-CM

## 2014-01-07 DIAGNOSIS — I2510 Atherosclerotic heart disease of native coronary artery without angina pectoris: Secondary | ICD-10-CM

## 2014-01-07 DIAGNOSIS — I10 Essential (primary) hypertension: Secondary | ICD-10-CM

## 2014-01-07 DIAGNOSIS — H109 Unspecified conjunctivitis: Secondary | ICD-10-CM

## 2014-01-07 DIAGNOSIS — E0590 Thyrotoxicosis, unspecified without thyrotoxic crisis or storm: Secondary | ICD-10-CM

## 2014-01-07 MED ORDER — SIMVASTATIN 40 MG PO TABS: ORAL_TABLET | ORAL | Status: DC

## 2014-01-07 MED ORDER — CIPROFLOXACIN HCL 0.3 % OP SOLN: 1.0000 [drp] | OPHTHALMIC | Status: DC

## 2014-01-07 MED ORDER — LEVOMILNACIPRAN HCL ER 20 & 40 MG PO C4PK: EXTENDED_RELEASE_CAPSULE | ORAL | Status: DC

## 2014-01-07 MED ORDER — PROPYLENE GLYCOL 0.6 % OP SOLN: 1.0000 [drp] | Freq: Two times a day (BID) | OPHTHALMIC | Status: DC

## 2014-01-07 NOTE — Progress Notes (Signed)
Patient ID: [name], female   DOB: March 18, 1932, 80 y.o.   MRN: [id number]    Chief Complaint  Patient presents with  . Follow-up    4 month f/u f/u, needs face to face for [facility]  . other    both eyes feel itchy (LT one red)   Allergies  Allergen Reactions  . Iodine Other (See Comments)    unknown  . Iohexol      Code: HIVES, Desc: hives with nonionic contrast 7 yrs ago ([place] iv benedryl given   . Peanut-Containing Drug Products    HPI 80 y/o female pt is here for routine visit.  She has dementia. Her husband and interpreter are present this visit She denies any complaints Husband mentions her being inactive. Her appetite is good. Sleeps a lot Uses a walker to move around No falls reported Pt mentions feeling low and losing interest in things she liked. More visitors coming recently and that helps some with her mood. Has cardiology appointment Has been itchy in both her eyes, no problem with vision., has some watering and sticky eye in am in left eye with some discomfort. Family mentions pt rubs her eye  Review of Systems   Constitutional: Negative for fever and chills.  HENT: Negative for congestion, sore throat and mouth sores.    Eyes: Negative for visual disturbance.   Respiratory: Negative for cough, chest tightness and shortness of breath.    Cardiovascular: Negative for chest pain, palpitations and leg swelling.   Gastrointestinal: Negative for nausea, vomiting, abdominal pain and constipation.   Genitourinary: Negative for dysuria, hematuria and vaginal discharge.   Musculoskeletal: Uses walker. Denies any falls/ trauma   Skin: Negative for rash.   Neurological: Negative for tremors, headache, dizziness, syncope and light-headedness.   Hematological: Negative for adenopathy.   Psychiatric/Behavioral: Negative for confusion, sleep disturbance and agitation. The patient is not nervous/anxious. Losing short term memory, long term memory is intact   Past  Medical History  Diagnosis Date  . Lumbar back pain   . Other urinary incontinence   . Memory loss   . DJD (degenerative joint disease)   . Depression   . Hypertension   . CVA (cerebral infarction)   . CHF (congestive heart failure)     Chronic Diastolic  . CAD (coronary artery disease)   . Presence of permanent cardiac pacemaker   . Osteoporosis   . Hypercholesterolemia   . Hyperthyroidism   . Anxiety   . Vertigo   . Tachy-brady syndrome   . Ischemic cardiomyopathy    Current Outpatient Prescriptions on File Prior to Visit  Medication Sig Dispense Refill  . alendronate (FOSAMAX) 70 MG tablet TAKE 1 TABLET BY MOUTH EVERY WEEK  12 tablet  0  . Ascorbic Acid (VITAMIN C) 500 MG tablet Take 500 mg by mouth daily.        Marland Kitchen aspirin (CVS ASPIRIN LOW DOSE) 81 MG EC tablet TAKE 1 TABLET (81 MG TOTAL) BY MOUTH DAILY. SWALLOW WHOLE.  30 tablet  0  . Calcium Carbonate-Vit D-Min 600-400 MG-UNIT TABS Take 1 tablet by mouth 2 (two) times daily.       . clopidogrel (PLAVIX) 75 MG tablet TAKE 1 TABLET BY MOUTH EVERY DAY  30 tablet  3  . lisinopril (PRINIVIL,ZESTRIL) 10 MG tablet TAKE 1 TABLET (10 MG TOTAL) BY MOUTH DAILY.  30 tablet  3  . methimazole (TAPAZOLE) 5 MG tablet Take one tablet by mouth three days a week  as prescribed by endocrinology  12 tablet  3  . Multiple Vitamins-Minerals (CENTRUM SILVER) tablet Take 1 tablet by mouth daily.        . nitroGLYCERIN (NITROSTAT) 0.4 MG SL tablet Place 0.4 mg under the tongue every 5 (five) minutes as needed. For chest pain       . polyethylene glycol (GLYCOLAX) powder Take 17 g by mouth daily as needed. constipation      . simvastatin (ZOCOR) 40 MG tablet TAKE 1 TABLET EVERY DAY  30 tablet  0  . spironolactone (ALDACTONE) 25 MG tablet TAKE 1 TABLET (25 MG TOTAL) BY MOUTH DAILY.  30 tablet  6   No current facility-administered medications on file prior to visit.   Physical exam BP 118/76  Pulse 86  Temp(Src) 97.7 F (36.5 C) (Oral)  Wt 182 lb  (82.555 kg)  SpO2 99%  gen- elderly female, obese, in NAD, flat affect heent- no pallor, no icterus, no LAD, mmm, no JVD cvs- ns 1,s2, rrr, systolic murmur present respi- b/l cta, no wheeze and rhonchi, no crackles abdo- bs+, soft, non tender Breast- b/l symmetrical, pendulous breasts, no mass or palpable lump Ext- able to move all 4, uses a walker, trace pedal edema Neuro- no focal deficit   Skin- dry, has seborrhoic keratosis   Lab Results  Component Value Date   TSH 0.591 08/26/2013   Lipid Panel     Component Value Date/Time   CHOL 142 09/21/2011 0532   TRIG 104 09/21/2011 0532   HDL 61 09/21/2011 0532   CHOLHDL 2.3 09/21/2011 0532   VLDL 21 09/21/2011 0532   LDLCALC 60 09/21/2011 0532   CMP     Component Value Date/Time   NA 142 08/26/2013 1413   NA 140 02/14/2013 0500   K 5.0 08/26/2013 1413   CL 98 08/26/2013 1413   CO2 26 08/26/2013 1413   GLUCOSE 81 08/26/2013 1413   GLUCOSE 111* 02/14/2013 0500   BUN 19 08/26/2013 1413   BUN 19 02/14/2013 0500   CREATININE 0.86 08/26/2013 1413   CALCIUM 10.2 08/26/2013 1413   PROT 6.5 11/15/2012 0936   PROT 6.5 09/21/2011 0532   ALBUMIN 3.5 09/21/2011 0532   AST 16 11/15/2012 0936   ALT 13 11/15/2012 0936   ALKPHOS 52 11/15/2012 0936   BILITOT 0.4 11/15/2012 0936   GFRNONAA 64 08/26/2013 1413   GFRAA 74 08/26/2013 1413   CBC    Component Value Date/Time   WBC 8.8 02/14/2013 0500   WBC 7.6 11/15/2012 0936   RBC 4.49 02/14/2013 0500   RBC 4.47 11/15/2012 0936   HGB 13.8 02/14/2013 0500   HCT 40.2 02/14/2013 0500   PLT 240 02/14/2013 0500   MCV 89.5 02/14/2013 0500   MCH 30.7 02/14/2013 0500   MCH 30.2 11/15/2012 0936   MCHC 34.3 02/14/2013 0500   MCHC 33.2 11/15/2012 0936   RDW 14.9 02/14/2013 0500   RDW 14.2 11/15/2012 0936   LYMPHSABS 3.7* 11/15/2012 0936   LYMPHSABS 2.7 09/20/2011 2324   MONOABS 1.2* 09/20/2011 2324   EOSABS 0.2 11/15/2012 0936   EOSABS 0.2 09/20/2011 2324   BASOSABS 0.0 11/15/2012 0936   BASOSABS 0.0 09/20/2011 2324     Assessment/plan  1. CAD Remains chest pain free. Continue asa, plavix, lisinopril and zocor, to follow with cardiology  2. HYPERTENSION bp under control. Continue lisinopril and spironolactone.    3. HYPERTHYROIDISM Continue methimazole, check labs today - CBC with Differential - TSH  4. OSTEOPOROSIS Continue  fosamax with ca-vit d, fall precautions  5. DEPRESSION Will start her on fetzima and reassess. Start 20 mg daily for 2 days and then go up to 40 mg daily until next visit in 4 weeks, samples provided - CBC with Differential - CMP  6. HYPERCHOLESTEROLEMIA Refill on zocor needed. Will provide refill and check lipid panel - Lipid Panel  7. Conjunctivitis, left eye Will have her on systane eye drop for both eyes and cipro eye drop for left eye for 5 days

## 2014-01-08 LAB — CBC WITH DIFFERENTIAL/PLATELET
BASOS: 0 %
Basophils Absolute: 0 10*3/uL (ref 0.0–0.2)
Eos: 1 %
Eosinophils Absolute: 0.1 10*3/uL (ref 0.0–0.4)
HEMATOCRIT: 41.6 % (ref 34.0–46.6)
Hemoglobin: 13.7 g/dL (ref 11.1–15.9)
Immature Grans (Abs): 0 10*3/uL (ref 0.0–0.1)
Immature Granulocytes: 0 %
LYMPHS ABS: 4.5 10*3/uL — AB (ref 0.7–3.1)
Lymphs: 47 %
MCH: 29.5 pg (ref 26.6–33.0)
MCHC: 32.9 g/dL (ref 31.5–35.7)
MCV: 90 fL (ref 79–97)
MONOS ABS: 0.7 10*3/uL (ref 0.1–0.9)
Monocytes: 7 %
Neutrophils Absolute: 4.4 10*3/uL (ref 1.4–7.0)
Neutrophils Relative %: 45 %
RBC: 4.64 x10E6/uL (ref 3.77–5.28)
RDW: 14.5 % (ref 12.3–15.4)
WBC: 9.7 10*3/uL (ref 3.4–10.8)

## 2014-01-08 LAB — LIPID PANEL
CHOL/HDL RATIO: 2.8 ratio (ref 0.0–4.4)
Cholesterol, Total: 169 mg/dL (ref 100–199)
HDL: 61 mg/dL (ref >39)
LDL Calculated: 84 mg/dL (ref 0–99)
Triglycerides: 119 mg/dL (ref 0–149)
VLDL Cholesterol Cal: 24 mg/dL (ref 5–40)

## 2014-01-08 LAB — COMPREHENSIVE METABOLIC PANEL
A/G RATIO: 1.7 (ref 1.1–2.5)
ALK PHOS: 50 IU/L (ref 39–117)
ALT: 14 IU/L (ref 0–32)
AST: 14 IU/L (ref 0–40)
Albumin: 4.2 g/dL (ref 3.5–4.8)
BUN / CREAT RATIO: 26 (ref 11–26)
BUN: 24 mg/dL (ref 8–27)
CO2: 26 mmol/L (ref 18–29)
CREATININE: 0.91 mg/dL (ref 0.57–1.00)
Calcium: 10.1 mg/dL (ref 8.7–10.3)
Chloride: 97 mmol/L (ref 97–108)
GFR, EST AFRICAN AMERICAN: 69 mL/min/{1.73_m2} (ref >59)
GFR, EST NON AFRICAN AMERICAN: 60 mL/min/{1.73_m2} (ref >59)
GLOBULIN, TOTAL: 2.5 g/dL (ref 1.5–4.5)
Glucose: 93 mg/dL (ref 65–99)
Potassium: 4.7 mmol/L (ref 3.5–5.2)
SODIUM: 137 mmol/L (ref 134–144)
Total Bilirubin: 0.5 mg/dL (ref 0.0–1.2)
Total Protein: 6.7 g/dL (ref 6.0–8.5)

## 2014-01-08 LAB — TSH: TSH: 0.504 u[IU]/mL (ref 0.450–4.500)

## 2014-01-14 ENCOUNTER — Encounter: Payer: Self-pay | Admitting: *Deleted

## 2014-01-19 ENCOUNTER — Other Ambulatory Visit: Payer: Self-pay | Admitting: Internal Medicine

## 2014-01-20 ENCOUNTER — Other Ambulatory Visit: Payer: Self-pay | Admitting: Internal Medicine

## 2014-01-23 ENCOUNTER — Telehealth: Payer: Self-pay | Admitting: *Deleted

## 2014-01-23 NOTE — Telephone Encounter (Signed)
Ok thanks 

## 2014-01-23 NOTE — Telephone Encounter (Signed)
FYI--[name] with [facility] called and stated that patient's was BP 158/110. Patient has headache and not feeling well. Patient is going to go to Urgent Care to be evaluated. Agreed.

## 2014-01-27 ENCOUNTER — Other Ambulatory Visit: Payer: Self-pay | Admitting: Internal Medicine

## 2014-02-10 ENCOUNTER — Ambulatory Visit: Payer: Medicare Other | Admitting: Internal Medicine

## 2014-02-12 NOTE — Progress Notes (Signed)
HPI [name] is a  80 yo woman with a h/o stroke, symptomatic bradycardia, paroxysmal atrial fibrillation, status post pacemaker insertion.  SHe is followed by [name] Seen earlier this year  I saw her in 2013 She is breathing OK  No CP  Has not fallen.  Does not do much  Gets up infrequently, naps.   Allergies  Allergen Reactions  . Iodine Other (See Comments)    unknown  . Iohexol      Code: HIVES, Desc: hives with nonionic contrast 7 yrs ago ([place] iv benedryl given   . Peanut-Containing Drug Products      Current Outpatient Prescriptions  Medication Sig Dispense Refill  . alendronate (FOSAMAX) 70 MG tablet TAKE 1 TABLET BY MOUTH EVERY WEEK  12 tablet  0  . Ascorbic Acid (VITAMIN C) 500 MG tablet Take 500 mg by mouth daily.        . Calcium Carbonate-Vit D-Min 600-400 MG-UNIT TABS Take 1 tablet by mouth 2 (two) times daily.       . clopidogrel (PLAVIX) 75 MG tablet Take 1 tablet (75 mg total) by mouth daily.  30 tablet  11  . CVS ASPIRIN LOW DOSE 81 MG EC tablet TAKE 1 TABLET (81 MG TOTAL) BY MOUTH DAILY. SWALLOW WHOLE.  30 tablet  0  . Levomilnacipran HCl ER (FETZIMA TITRATION) 20 & 40 MG C4PK Sample provided- 20 mg daily for 2 days and then 40 mg daily until seen in office next      . lisinopril (PRINIVIL,ZESTRIL) 10 MG tablet Take 1 tablet (10 mg total) by mouth daily.  30 tablet  11  . methimazole (TAPAZOLE) 5 MG tablet TAKE 1 TABLET BY MOUTH 3 DAYS A WEEK AS PRESCRIBED BY ENDOCRINOLOGY  12 tablet  1  . Multiple Vitamins-Minerals (CENTRUM SILVER) tablet Take 1 tablet by mouth daily.        . nitroGLYCERIN (NITROSTAT) 0.4 MG SL tablet Place 0.4 mg under the tongue every 5 (five) minutes as needed. For chest pain       . polyethylene glycol powder (GLYCOLAX/MIRALAX) powder MIX 1/2 CAPFUL WITH 8 OUNCES OF WATER AND TAKE AT BEDTIME AS NEEDED FOR CONSTIPATION  527 g  6  . Propylene Glycol (SYSTANE BALANCE) 0.6 % SOLN Apply 1 drop to eye 2 (two) times daily.  1 Bottle  3  .  simvastatin (ZOCOR) 40 MG tablet Take 1 tablet (40 mg total) by mouth daily at 6 PM.  30 tablet  11  . spironolactone (ALDACTONE) 25 MG tablet TAKE 1 TABLET (25 MG TOTAL) BY MOUTH DAILY.  30 tablet  11   No current facility-administered medications for this visit.     Past Medical History  Diagnosis Date  . Lumbar back pain   . Other urinary incontinence   . Memory loss   . DJD (degenerative joint disease)   . Depression   . Hypertension   . CVA (cerebral infarction)   . CHF (congestive heart failure)     Chronic Diastolic  . CAD (coronary artery disease)   . Presence of permanent cardiac pacemaker   . Osteoporosis   . Hypercholesterolemia   . Hyperthyroidism   . Anxiety   . Vertigo   . Tachy-brady syndrome   . Ischemic cardiomyopathy     ROS:   All systems reviewed and negative except as noted in the HPI.   Past Surgical History  Procedure Laterality Date  . Pacemaker insertion  2011  . Total knee  arthroplasty  2002    bilateral  . Abdominal hysterectomy  1986  . Bilateral stenting  1999    to the RCA  . Cardiac catheterization  05/23/10  . Total abdominal hysterectomy w/ bilateral salpingoophorectomy       Family History  Problem Relation Age of Onset  . Thyroid disease Daughter   . Diabetes Other   . Hypertension Other   . Arthritis Other   . Coronary artery disease Mother 23  . Coronary artery disease Father 8     History   Social History  . Marital Status: Married    Spouse Name: N/A    Number of Children: N/A  . Years of Education: N/A   Occupational History  . Not on file.   Social History Main Topics  . Smoking status: Never Smoker   . Smokeless tobacco: Not on file  . Alcohol Use: No  . Drug Use: No  . Sexual Activity: Not on file   Other Topics Concern  . Not on file   Social History Narrative  . No narrative on file     BP 124/68  Pulse 68  Ht 5\' 1"  (1.549 m)  Wt 180 lb (81.647 kg)  BMI 34.03 kg/m2  Physical  Exam: Patient is a obese 80 yo in NAD HEENT: Unremarkable Neck:  JVP is normal Lungs:  Clear with no wheezes, rales, or rhonchi. HEART:  Regular rate rhythm, no murmurs, no rubs, no clicks Abd:  soft, positive bowel sounds, no organomegally, no rebound, no guarding Ext:  2 plus pulses, no edema,  EKG  Atrial paced LVH with repol abnormality Assess/Plan:  1.  Chronic diastolic CHF  Volume status is good  2.  Hx CVA  3.  Hx atrial fib   Not an anticoag candidate  4.  Hx CAD  No angina  5.  Hx bradycardia  S/p PPM    6.  HL  LIpids are good  Continue meds  Keep on same regimen

## 2014-02-13 ENCOUNTER — Encounter: Payer: Self-pay | Admitting: Internal Medicine

## 2014-02-13 ENCOUNTER — Ambulatory Visit (INDEPENDENT_AMBULATORY_CARE_PROVIDER_SITE_OTHER): Payer: Medicare Other | Admitting: Internal Medicine

## 2014-02-13 VITALS — BP 124/68 | HR 68 | Ht 61.0 in | Wt 180.0 lb

## 2014-02-13 DIAGNOSIS — I10 Essential (primary) hypertension: Secondary | ICD-10-CM

## 2014-02-13 DIAGNOSIS — I2510 Atherosclerotic heart disease of native coronary artery without angina pectoris: Secondary | ICD-10-CM

## 2014-02-13 MED ORDER — SPIRONOLACTONE 25 MG PO TABS: ORAL_TABLET | ORAL | Status: DC

## 2014-02-13 MED ORDER — CLOPIDOGREL BISULFATE 75 MG PO TABS: 75.0000 mg | ORAL_TABLET | Freq: Every day | ORAL | Status: DC

## 2014-02-13 MED ORDER — LISINOPRIL 10 MG PO TABS: 10.0000 mg | ORAL_TABLET | Freq: Every day | ORAL | Status: DC

## 2014-02-13 MED ORDER — SIMVASTATIN 40 MG PO TABS: 40.0000 mg | ORAL_TABLET | Freq: Every day | ORAL | Status: DC

## 2014-02-13 NOTE — Patient Instructions (Addendum)
Your physician recommends that you continue on your current medications as directed. Please refer to the Current Medication list given to you today. Your physician wants you to follow-up in: 1 year with Dr. Ross.  You will receive a reminder letter in the mail two months in advance. If you don't receive a letter, please call our office to schedule the follow-up appointment.  

## 2014-02-16 ENCOUNTER — Other Ambulatory Visit: Payer: Self-pay | Admitting: Internal Medicine

## 2014-02-19 ENCOUNTER — Other Ambulatory Visit: Payer: Self-pay | Admitting: *Deleted

## 2014-02-19 MED ORDER — ASPIRIN 81 MG PO TBEC
DELAYED_RELEASE_TABLET | ORAL | Status: DC
Start: 2014-02-19 — End: 2014-08-26

## 2014-02-19 MED ORDER — ALENDRONATE SODIUM 70 MG PO TABS: ORAL_TABLET | ORAL | Status: DC

## 2014-02-20 ENCOUNTER — Other Ambulatory Visit: Payer: Self-pay | Admitting: Internal Medicine

## 2014-03-16 ENCOUNTER — Other Ambulatory Visit: Payer: Self-pay | Admitting: *Deleted

## 2014-03-16 MED ORDER — METHIMAZOLE 5 MG PO TABS: ORAL_TABLET | ORAL | Status: DC

## 2014-03-27 ENCOUNTER — Other Ambulatory Visit: Payer: Self-pay | Admitting: *Deleted

## 2014-03-27 MED ORDER — POLYETHYLENE GLYCOL 3350 17 GM/SCOOP PO POWD: ORAL | Status: DC

## 2014-03-31 ENCOUNTER — Other Ambulatory Visit: Payer: Self-pay

## 2014-03-31 MED ORDER — NITROGLYCERIN 0.4 MG SL SUBL: 0.4000 mg | SUBLINGUAL_TABLET | SUBLINGUAL | Status: AC | PRN

## 2014-04-15 ENCOUNTER — Encounter: Payer: Self-pay | Admitting: Internal Medicine

## 2014-04-15 ENCOUNTER — Ambulatory Visit (INDEPENDENT_AMBULATORY_CARE_PROVIDER_SITE_OTHER): Payer: Medicare Other | Admitting: *Deleted

## 2014-04-15 DIAGNOSIS — I495 Sick sinus syndrome: Secondary | ICD-10-CM

## 2014-04-15 DIAGNOSIS — Z950 Presence of cardiac pacemaker: Secondary | ICD-10-CM

## 2014-04-15 DIAGNOSIS — I480 Paroxysmal atrial fibrillation: Secondary | ICD-10-CM

## 2014-04-15 LAB — MDC_IDC_ENUM_SESS_TYPE_INCLINIC
Battery Impedance: 157 Ohm
Battery Voltage: 2.79 V
Brady Statistic AP VP Percent: 1 %
Brady Statistic AP VS Percent: 66 %
Date Time Interrogation Session: 20151028125257
Lead Channel Impedance Value: 464 Ohm
Lead Channel Impedance Value: 687 Ohm
Lead Channel Pacing Threshold Amplitude: 0.75 V
Lead Channel Pacing Threshold Pulse Width: 0.4 ms
Lead Channel Pacing Threshold Pulse Width: 0.4 ms
Lead Channel Sensing Intrinsic Amplitude: 4 mV
Lead Channel Setting Pacing Amplitude: 2 V
Lead Channel Setting Pacing Amplitude: 2.5 V
Lead Channel Setting Sensing Sensitivity: 5.6 mV
MDC IDC MSMT BATTERY REMAINING LONGEVITY: 132 mo
MDC IDC MSMT LEADCHNL RV PACING THRESHOLD AMPLITUDE: 0.75 V
MDC IDC MSMT LEADCHNL RV SENSING INTR AMPL: 15.67 mV
MDC IDC SET LEADCHNL RV PACING PULSEWIDTH: 0.4 ms
MDC IDC STAT BRADY AS VP PERCENT: 1 %
MDC IDC STAT BRADY AS VS PERCENT: 32 %

## 2014-04-15 NOTE — Progress Notes (Signed)
Pacemaker check in clinic. Normal device function. Thresholds, sensing, impedances consistent with previous measurements. Device programmed to maximize longevity. 62 mode switches--- <0.1%, longest 2hr37min, >400bpm---no anti-coag due to fall risk. 1 high ventricular rate---was SVT. Device programmed at appropriate safety margins. Histogram distribution appropriate for patient activity level. Device programmed to optimize intrinsic conduction. Estimated longevity 100yrs. ROV w/ Dr. Ladona Ridgel in 65mo.

## 2014-05-13 ENCOUNTER — Other Ambulatory Visit: Payer: Self-pay | Admitting: Internal Medicine

## 2014-06-09 ENCOUNTER — Other Ambulatory Visit: Payer: Self-pay | Admitting: Internal Medicine

## 2014-06-10 ENCOUNTER — Other Ambulatory Visit: Payer: Self-pay | Admitting: *Deleted

## 2014-06-10 MED ORDER — METHIMAZOLE 5 MG PO TABS: ORAL_TABLET | ORAL | Status: DC

## 2014-06-10 NOTE — Telephone Encounter (Signed)
[facility] [employer] 

## 2014-07-08 ENCOUNTER — Ambulatory Visit: Payer: Medicare Other | Admitting: Internal Medicine

## 2014-07-14 ENCOUNTER — Ambulatory Visit (INDEPENDENT_AMBULATORY_CARE_PROVIDER_SITE_OTHER): Payer: Medicare Other | Admitting: Internal Medicine

## 2014-07-14 ENCOUNTER — Encounter: Payer: Self-pay | Admitting: Internal Medicine

## 2014-07-14 VITALS — BP 110/66 | HR 70 | Temp 98.1°F | Resp 10 | Ht 61.0 in | Wt 180.5 lb

## 2014-07-14 DIAGNOSIS — I5032 Chronic diastolic (congestive) heart failure: Secondary | ICD-10-CM | POA: Insufficient documentation

## 2014-07-14 DIAGNOSIS — R7303 Prediabetes: Secondary | ICD-10-CM | POA: Insufficient documentation

## 2014-07-14 DIAGNOSIS — E0590 Thyrotoxicosis, unspecified without thyrotoxic crisis or storm: Secondary | ICD-10-CM

## 2014-07-14 DIAGNOSIS — Z950 Presence of cardiac pacemaker: Secondary | ICD-10-CM

## 2014-07-14 DIAGNOSIS — I10 Essential (primary) hypertension: Secondary | ICD-10-CM

## 2014-07-14 DIAGNOSIS — E785 Hyperlipidemia, unspecified: Secondary | ICD-10-CM | POA: Insufficient documentation

## 2014-07-14 DIAGNOSIS — R7309 Other abnormal glucose: Secondary | ICD-10-CM

## 2014-07-14 DIAGNOSIS — M810 Age-related osteoporosis without current pathological fracture: Secondary | ICD-10-CM

## 2014-07-14 MED ORDER — ALENDRONATE SODIUM 70 MG PO TABS: 70.0000 mg | ORAL_TABLET | ORAL | Status: DC

## 2014-07-14 NOTE — Progress Notes (Signed)
Patient ID: [name], female   DOB: 1931-10-16, 81 y.o.   MRN: [id number]    Chief Complaint  Patient presents with  . Medical Management of Chronic Issues    6 month follow-up, no recent labs (last meal at 12 pm)  . Depression    Patient with depression, not interested in seeing Psych    Allergies  Allergen Reactions  . Iodine Other (See Comments)    unknown  . Iohexol      Code: HIVES, Desc: hives with nonionic contrast 7 yrs ago ([place] iv benedryl given   . Peanut-Containing Drug Products    HPI 81 y/o female pt is here for routine visit.   No falls. No concerns. She has dementia and lives at home with her husband and her husband and son are main care provider for her.Her husband and interpreter are present this visit Her appetite is good.  Uses a walker to move around Follows up with cardiology Her mood has been ok as per patient but continues to have low energy level. Pt has not been tearful. She has been noted to be depressed in the past and has refused medication and psychiatry help. Due for lab draw  Review of Systems   Constitutional: Negative for fever and chills.  HENT: Negative for congestion, sore throat and mouth sores.    Eyes: Negative for visual disturbance.   Respiratory: Negative for cough, chest tightness and shortness of breath.    Cardiovascular: Negative for chest pain, palpitations and leg swelling.   Gastrointestinal: Negative for nausea, vomiting, abdominal pain and constipation.   Genitourinary: Negative for dysuria, hematuria and vaginal discharge.   Musculoskeletal: Uses walker. Denies any falls/ trauma   Skin: Negative for rash.   Neurological: Negative for tremors, headache, dizziness, syncope and light-headedness.   Hematological: Negative for adenopathy.   Psychiatric/Behavioral: Negative for confusion, sleep disturbance and agitation. The patient is not nervous/anxious. Losing short term memory, long term memory is intact   Past  Medical History  Diagnosis Date  . Lumbar back pain   . Other urinary incontinence   . Memory loss   . DJD (degenerative joint disease)   . Depression   . Hypertension   . CVA (cerebral infarction)   . CHF (congestive heart failure)     Chronic Diastolic  . CAD (coronary artery disease)   . Presence of permanent cardiac pacemaker   . Osteoporosis   . Hypercholesterolemia   . Hyperthyroidism   . Anxiety   . Vertigo   . Tachy-brady syndrome   . Ischemic cardiomyopathy    Current Outpatient Prescriptions on File Prior to Visit  Medication Sig Dispense Refill  . Ascorbic Acid (VITAMIN C) 500 MG tablet Take 500 mg by mouth daily.      Marland Kitchen aspirin ([facility] ASPIRIN LOW DOSE) 81 MG EC tablet TAKE 1 TABLET (81 MG TOTAL) BY MOUTH DAILY. SWALLOW WHOLE. 30 tablet 11  . Calcium Carbonate-Vit D-Min 600-400 MG-UNIT TABS Take 1 tablet by mouth 2 (two) times daily.     . clopidogrel (PLAVIX) 75 MG tablet Take 1 tablet (75 mg total) by mouth daily. 30 tablet 11  . lisinopril (PRINIVIL,ZESTRIL) 10 MG tablet Take 1 tablet (10 mg total) by mouth daily. 30 tablet 11  . methimazole (TAPAZOLE) 5 MG tablet Take one tablet by mouth three days a week as prescribed by endocrinology 12 tablet 3  . Multiple Vitamins-Minerals (CENTRUM SILVER) tablet Take 1 tablet by mouth daily.      Marland Kitchen  nitroGLYCERIN (NITROSTAT) 0.4 MG SL tablet Place 1 tablet (0.4 mg total) under the tongue every 5 (five) minutes as needed. For chest pain 30 tablet 0  . polyethylene glycol powder (GLYCOLAX/MIRALAX) powder MIX 1/2 CAPFUL WITH 8 OUNCES OF WATER AND TAKE AT BEDTIME AS NEEDED FOR CONSTIPATION 527 g 6  . Propylene Glycol (SYSTANE BALANCE) 0.6 % SOLN Apply 1 drop to eye 2 (two) times daily. 1 Bottle 3  . simvastatin (ZOCOR) 40 MG tablet Take 1 tablet (40 mg total) by mouth daily at 6 PM. 30 tablet 11  . spironolactone (ALDACTONE) 25 MG tablet TAKE 1 TABLET DAILY 30 tablet 3   No current facility-administered medications on file prior to  visit.    Physical exam BP 110/66 mmHg  Pulse 70  Temp(Src) 98.1 F (36.7 C) (Oral)  Resp 10  Ht  (1.549 m)  Wt 180 lb 8 oz (81.874 kg)  BMI 34.12 kg/m2  SpO2 99%  General- elderly female in no acute distress Head- atraumatic, normocephalic Eyes- PERRLA, EOMI, no pallor, no icterus, no discharge Neck- no lymphadenopathy, no thyromegaly Mouth- normal mucus membrane Cardiovascular- normal s1,s2, systolic murmurs, normal distal pulses Respiratory- bilateral clear to auscultation, no wheeze, no rhonchi, no crackles Abdomen- bowel sounds present, soft, non tender Musculoskeletal- able to move all 4 extremities, uses a walker, no leg edema Neurological- no focal deficit Skin- warm and dry Psychiatry- alert and oriented to person, place and time, more interactive and awake than last visit   Lab Results  Component Value Date   TSH 0.504 01/07/2014   CBC Latest Ref Rng 01/07/2014 02/14/2013 02/13/2013  WBC 3.4 - 10.8 x10E3/uL 9.7 8.8 9.3  Hemoglobin 11.1 - 15.9 g/dL 78.2 95.6 21.3  Hematocrit 34.0 - 46.6 % 41.6 40.2 41.5  Platelets 150 - 400 K/uL - 240 240   CMP Latest Ref Rng 01/07/2014 08/26/2013 04/02/2013  Glucose 65 - 99 mg/dL 93 81 086(V)  BUN 8 - 27 mg/dL Creatinine 0.57 - 1.00 mg/dL 7.84 6.96 2.95  Sodium 134 - 144 mmol/L 137 142 141  Potassium 3.5 - 5.2 mmol/L 4.7 5.0 4.9  Chloride 97 - 108 mmol/L 97 98 98  CO2 18 - 29 mmol/L Calcium 8.7 - 10.3 mg/dL 28.4 13.2 44.0  Total Protein 6.0 - 8.5 g/dL 6.7 - -  Albumin 3.5 - 4.8 g/dL 4.2 - -  Total Bilirubin 0.0 - 1.2 mg/dL 0.5 - -  Alkaline Phos 39 - 117 IU/L 50 - -  AST 0 - 40 IU/L 14 - -  ALT 0 - 32 IU/L 14 - -   Lipid Panel     Component Value Date/Time   CHOL 142 09/21/2011 0532   TRIG 119 01/07/2014 1437   TRIG 129 10/06/2009   HDL 61 01/07/2014 1437   HDL 61 09/21/2011 0532   CHOLHDL 2.8 01/07/2014 1437   CHOLHDL 2.3 09/21/2011 0532   VLDL 21 09/21/2011 0532   LDLCALC 84 01/07/2014  1437   LDLCALC 60 09/21/2011 0532   Lab Results  Component Value Date   HGBA1C 6.1* 09/21/2011    Assessment/plan  1. Thyrotoxicosis without thyroid storm, unspecified thyrotoxicosis type Normal thyroid level in 7/15. Continue methimazole, check tsh today - TSH  2. Osteoporosis Continue weekly fosamax, ca-vit d and fall precautions - Vitamin D, 1,25-dihydroxy  3. Essential hypertension Stable, continue lisinopril 10 mg daily, aldactone - CBC with Differential - CMP  4. PACEMAKER-[employer] F/b cardiology, rate stable  5. Chronic diastolic heart failure euvolemic on exam, continue lisinopril and aldactone - CBC with Differential - CMP - Lipid Panel  6. Prediabetes Recheck a1c and thyroid - TSH - Hemoglobin A1c  7. Hyperlipidemia Continue zocor 40 mg daily - Lipid Panel

## 2014-07-19 LAB — COMPREHENSIVE METABOLIC PANEL
A/G RATIO: 1.7 (ref 1.1–2.5)
ALBUMIN: 4.3 g/dL (ref 3.5–4.7)
ALT: 17 IU/L (ref 0–32)
AST: 21 IU/L (ref 0–40)
Alkaline Phosphatase: 49 IU/L (ref 39–117)
BILIRUBIN TOTAL: 0.3 mg/dL (ref 0.0–1.2)
BUN/Creatinine Ratio: 20 (ref 11–26)
BUN: 18 mg/dL (ref 8–27)
CALCIUM: 9.9 mg/dL (ref 8.7–10.3)
CO2: 27 mmol/L (ref 18–29)
Chloride: 99 mmol/L (ref 97–108)
Creatinine, Ser: 0.9 mg/dL (ref 0.57–1.00)
GFR calc Af Amer: 70 mL/min/{1.73_m2} (ref >59)
GFR, EST NON AFRICAN AMERICAN: 61 mL/min/{1.73_m2} (ref >59)
GLOBULIN, TOTAL: 2.5 g/dL (ref 1.5–4.5)
GLUCOSE: 100 mg/dL — AB (ref 65–99)
Potassium: 4.9 mmol/L (ref 3.5–5.2)
Sodium: 142 mmol/L (ref 134–144)
Total Protein: 6.8 g/dL (ref 6.0–8.5)

## 2014-07-19 LAB — CBC WITH DIFFERENTIAL/PLATELET
Basophils Absolute: 0 10*3/uL (ref 0.0–0.2)
Basos: 0 %
EOS: 1 %
Eosinophils Absolute: 0.1 10*3/uL (ref 0.0–0.4)
HEMATOCRIT: 44 % (ref 34.0–46.6)
HEMOGLOBIN: 14.4 g/dL (ref 11.1–15.9)
Immature Grans (Abs): 0 10*3/uL (ref 0.0–0.1)
Immature Granulocytes: 0 %
LYMPHS ABS: 4.3 10*3/uL — AB (ref 0.7–3.1)
LYMPHS: 45 %
MCH: 29.9 pg (ref 26.6–33.0)
MCHC: 32.7 g/dL (ref 31.5–35.7)
MCV: 91 fL (ref 79–97)
Monocytes Absolute: 0.8 10*3/uL (ref 0.1–0.9)
Monocytes: 8 %
NEUTROS ABS: 4.3 10*3/uL (ref 1.4–7.0)
Neutrophils Relative %: 46 %
Platelets: 241 10*3/uL (ref 150–379)
RBC: 4.82 x10E6/uL (ref 3.77–5.28)
RDW: 14.1 % (ref 12.3–15.4)
WBC: 9.6 10*3/uL (ref 3.4–10.8)

## 2014-07-19 LAB — LIPID PANEL
Chol/HDL Ratio: 2.3 ratio units (ref 0.0–4.4)
Cholesterol, Total: 165 mg/dL (ref 100–199)
HDL: 72 mg/dL (ref >39)
LDL CALC: 59 mg/dL (ref 0–99)
TRIGLYCERIDES: 169 mg/dL — AB (ref 0–149)
VLDL CHOLESTEROL CAL: 34 mg/dL (ref 5–40)

## 2014-07-19 LAB — HEMOGLOBIN A1C
Est. average glucose Bld gHb Est-mCnc: 123 mg/dL
HEMOGLOBIN A1C: 5.9 % — AB (ref 4.8–5.6)

## 2014-07-19 LAB — VITAMIN D 1,25 DIHYDROXY
Vitamin D 1, 25 (OH)2 Total: 50 pg/mL
Vitamin D3 1, 25 (OH)2: 50 pg/mL

## 2014-07-19 LAB — TSH: TSH: 0.674 u[IU]/mL (ref 0.450–4.500)

## 2014-07-25 ENCOUNTER — Encounter (HOSPITAL_COMMUNITY): Payer: Self-pay | Admitting: Emergency Medicine

## 2014-07-25 ENCOUNTER — Inpatient Hospital Stay (HOSPITAL_COMMUNITY)
Admission: EM | Admit: 2014-07-25 | Discharge: 2014-07-30 | DRG: 312 | Disposition: A | Discharge status: Skilled Nursing Facility | Payer: Medicare Other | Attending: Internal Medicine | Admitting: Internal Medicine

## 2014-07-25 ENCOUNTER — Emergency Department (HOSPITAL_COMMUNITY): Payer: Medicare Other

## 2014-07-25 DIAGNOSIS — H8190 Unspecified disorder of vestibular function, unspecified ear: Secondary | ICD-10-CM | POA: Insufficient documentation

## 2014-07-25 DIAGNOSIS — E860 Dehydration: Secondary | ICD-10-CM | POA: Diagnosis present

## 2014-07-25 DIAGNOSIS — I255 Ischemic cardiomyopathy: Secondary | ICD-10-CM | POA: Diagnosis present

## 2014-07-25 DIAGNOSIS — I5032 Chronic diastolic (congestive) heart failure: Secondary | ICD-10-CM | POA: Diagnosis present

## 2014-07-25 DIAGNOSIS — M810 Age-related osteoporosis without current pathological fracture: Secondary | ICD-10-CM | POA: Diagnosis present

## 2014-07-25 DIAGNOSIS — I951 Orthostatic hypotension: Secondary | ICD-10-CM | POA: Diagnosis not present

## 2014-07-25 DIAGNOSIS — Z96653 Presence of artificial knee joint, bilateral: Secondary | ICD-10-CM | POA: Diagnosis not present

## 2014-07-25 DIAGNOSIS — I10 Essential (primary) hypertension: Secondary | ICD-10-CM | POA: Diagnosis present

## 2014-07-25 DIAGNOSIS — R531 Weakness: Secondary | ICD-10-CM

## 2014-07-25 DIAGNOSIS — I495 Sick sinus syndrome: Secondary | ICD-10-CM | POA: Diagnosis present

## 2014-07-25 DIAGNOSIS — R42 Dizziness and giddiness: Secondary | ICD-10-CM

## 2014-07-25 DIAGNOSIS — E0590 Thyrotoxicosis, unspecified without thyrotoxic crisis or storm: Secondary | ICD-10-CM

## 2014-07-25 DIAGNOSIS — I639 Cerebral infarction, unspecified: Secondary | ICD-10-CM

## 2014-07-25 DIAGNOSIS — Z7983 Long term (current) use of bisphosphonates: Secondary | ICD-10-CM

## 2014-07-25 DIAGNOSIS — R0600 Dyspnea, unspecified: Secondary | ICD-10-CM

## 2014-07-25 DIAGNOSIS — Z7982 Long term (current) use of aspirin: Secondary | ICD-10-CM

## 2014-07-25 DIAGNOSIS — Z7902 Long term (current) use of antithrombotics/antiplatelets: Secondary | ICD-10-CM

## 2014-07-25 DIAGNOSIS — I6350 Cerebral infarction due to unspecified occlusion or stenosis of unspecified cerebral artery: Secondary | ICD-10-CM | POA: Diagnosis present

## 2014-07-25 DIAGNOSIS — M1990 Unspecified osteoarthritis, unspecified site: Secondary | ICD-10-CM | POA: Diagnosis present

## 2014-07-25 DIAGNOSIS — Z8673 Personal history of transient ischemic attack (TIA), and cerebral infarction without residual deficits: Secondary | ICD-10-CM

## 2014-07-25 DIAGNOSIS — I509 Heart failure, unspecified: Secondary | ICD-10-CM

## 2014-07-25 DIAGNOSIS — Z950 Presence of cardiac pacemaker: Secondary | ICD-10-CM | POA: Diagnosis present

## 2014-07-25 DIAGNOSIS — R4182 Altered mental status, unspecified: Secondary | ICD-10-CM | POA: Diagnosis not present

## 2014-07-25 DIAGNOSIS — I2510 Atherosclerotic heart disease of native coronary artery without angina pectoris: Secondary | ICD-10-CM | POA: Diagnosis present

## 2014-07-25 LAB — COMPREHENSIVE METABOLIC PANEL
ALK PHOS: 44 U/L (ref 39–117)
ALT: 15 U/L (ref 0–35)
AST: 25 U/L (ref 0–37)
Albumin: 3.6 g/dL (ref 3.5–5.2)
Anion gap: 8 (ref 5–15)
BUN: 19 mg/dL (ref 6–23)
CO2: 28 mmol/L (ref 19–32)
Calcium: 9.1 mg/dL (ref 8.4–10.5)
Chloride: 104 mmol/L (ref 96–112)
Creatinine, Ser: 0.86 mg/dL (ref 0.50–1.10)
GFR calc Af Amer: 72 mL/min — ABNORMAL LOW (ref >90)
GFR calc non Af Amer: 62 mL/min — ABNORMAL LOW (ref >90)
GLUCOSE: 108 mg/dL — AB (ref 70–99)
Potassium: 3.9 mmol/L (ref 3.5–5.1)
Sodium: 140 mmol/L (ref 135–145)
Total Bilirubin: 0.8 mg/dL (ref 0.3–1.2)
Total Protein: 6.7 g/dL (ref 6.0–8.3)

## 2014-07-25 LAB — CBC
HEMATOCRIT: 42.2 % (ref 36.0–46.0)
Hemoglobin: 14 g/dL (ref 12.0–15.0)
MCH: 29.9 pg (ref 26.0–34.0)
MCHC: 33.2 g/dL (ref 30.0–36.0)
MCV: 90.2 fL (ref 78.0–100.0)
Platelets: 213 10*3/uL (ref 150–400)
RBC: 4.68 MIL/uL (ref 3.87–5.11)
RDW: 14.6 % (ref 11.5–15.5)
WBC: 8.4 10*3/uL (ref 4.0–10.5)

## 2014-07-25 LAB — URINALYSIS, ROUTINE W REFLEX MICROSCOPIC
BILIRUBIN URINE: NEGATIVE
Glucose, UA: NEGATIVE mg/dL
HGB URINE DIPSTICK: NEGATIVE
KETONES UR: NEGATIVE mg/dL
LEUKOCYTES UA: NEGATIVE
NITRITE: NEGATIVE
PROTEIN: NEGATIVE mg/dL
Specific Gravity, Urine: 1.009 (ref 1.005–1.030)
Urobilinogen, UA: 0.2 mg/dL (ref 0.0–1.0)
pH: 5 (ref 5.0–8.0)

## 2014-07-25 LAB — CBG MONITORING, ED: Glucose-Capillary: 98 mg/dL (ref 70–99)

## 2014-07-25 LAB — TSH: TSH: 0.589 u[IU]/mL (ref 0.350–4.500)

## 2014-07-25 LAB — I-STAT CG4 LACTIC ACID, ED: LACTIC ACID, VENOUS: 0.91 mmol/L (ref 0.5–2.0)

## 2014-07-25 MED ORDER — HYPROMELLOSE (GONIOSCOPIC) 2.5 % OP SOLN: 1.0000 [drp] | OPHTHALMIC | Status: DC | PRN

## 2014-07-25 MED ORDER — GUAIFENESIN-DM 100-10 MG/5ML PO SYRP
5.0000 mL | ORAL_SOLUTION | ORAL | Status: DC | PRN
  Filled 2014-07-25: qty 5

## 2014-07-25 MED ORDER — SIMVASTATIN 40 MG PO TABS
40.0000 mg | ORAL_TABLET | Freq: Every day | ORAL | Status: DC
  Administered 2014-07-25 – 2014-07-29 (×5): 40 mg via ORAL
  Filled 2014-07-25 (×7): qty 1

## 2014-07-25 MED ORDER — HYDROCODONE-ACETAMINOPHEN 5-325 MG PO TABS: 1.0000 | ORAL_TABLET | ORAL | Status: DC | PRN

## 2014-07-25 MED ORDER — ONDANSETRON HCL 4 MG PO TABS: 4.0000 mg | ORAL_TABLET | Freq: Four times a day (QID) | ORAL | Status: DC | PRN

## 2014-07-25 MED ORDER — CALCIUM CARBONATE-VIT D-MIN 600-400 MG-UNIT PO TABS: 1.0000 | ORAL_TABLET | Freq: Two times a day (BID) | ORAL | Status: DC

## 2014-07-25 MED ORDER — ASPIRIN 81 MG PO TBEC
81.0000 mg | DELAYED_RELEASE_TABLET | Freq: Every day | ORAL | Status: DC
  Administered 2014-07-25 – 2014-07-30 (×6): 81 mg via ORAL
  Filled 2014-07-25 (×7): qty 1

## 2014-07-25 MED ORDER — CLOPIDOGREL BISULFATE 75 MG PO TABS
75.0000 mg | ORAL_TABLET | Freq: Every day | ORAL | Status: DC
  Administered 2014-07-25 – 2014-07-30 (×6): 75 mg via ORAL
  Filled 2014-07-25 (×6): qty 1

## 2014-07-25 MED ORDER — POLYETHYLENE GLYCOL 3350 17 G PO PACK
17.0000 g | PACK | Freq: Two times a day (BID) | ORAL | Status: DC
  Administered 2014-07-25 – 2014-07-30 (×8): 17 g via ORAL
  Filled 2014-07-25 (×11): qty 1

## 2014-07-25 MED ORDER — METHIMAZOLE 5 MG PO TABS
5.0000 mg | ORAL_TABLET | Freq: Every day | ORAL | Status: DC
  Administered 2014-07-25 – 2014-07-30 (×6): 5 mg via ORAL
  Filled 2014-07-25 (×7): qty 1

## 2014-07-25 MED ORDER — HEPARIN SODIUM (PORCINE) 5000 UNIT/ML IJ SOLN
5000.0000 [IU] | Freq: Three times a day (TID) | INTRAMUSCULAR | Status: DC
  Administered 2014-07-25 – 2014-07-30 (×15): 5000 [IU] via SUBCUTANEOUS
  Filled 2014-07-25 (×16): qty 1

## 2014-07-25 MED ORDER — SODIUM CHLORIDE 0.9 % IJ SOLN
3.0000 mL | Freq: Two times a day (BID) | INTRAMUSCULAR | Status: DC
  Administered 2014-07-27 – 2014-07-30 (×5): 3 mL via INTRAVENOUS

## 2014-07-25 MED ORDER — ACETAMINOPHEN 325 MG PO TABS
650.0000 mg | ORAL_TABLET | Freq: Four times a day (QID) | ORAL | Status: DC | PRN
  Administered 2014-07-25 – 2014-07-30 (×4): 650 mg via ORAL
  Filled 2014-07-25 (×4): qty 2

## 2014-07-25 MED ORDER — CALCIUM CARBONATE-VITAMIN D 500-200 MG-UNIT PO TABS
1.0000 | ORAL_TABLET | Freq: Two times a day (BID) | ORAL | Status: DC
Start: 2014-07-25 — End: 2014-07-30
  Administered 2014-07-25 – 2014-07-30 (×10): 1 via ORAL
  Filled 2014-07-25 (×11): qty 1

## 2014-07-25 MED ORDER — SODIUM CHLORIDE 0.9 % IV SOLN
INTRAVENOUS | Status: AC
  Administered 2014-07-25 (×2): via INTRAVENOUS

## 2014-07-25 MED ORDER — PROPYLENE GLYCOL 0.6 % OP SOLN: 1.0000 [drp] | Freq: Two times a day (BID) | OPHTHALMIC | Status: DC | PRN

## 2014-07-25 MED ORDER — NITROGLYCERIN 0.4 MG SL SUBL: 0.4000 mg | SUBLINGUAL_TABLET | SUBLINGUAL | Status: DC | PRN

## 2014-07-25 MED ORDER — VITAMIN C 500 MG PO TABS
500.0000 mg | ORAL_TABLET | Freq: Every day | ORAL | Status: DC
  Administered 2014-07-25 – 2014-07-30 (×6): 500 mg via ORAL
  Filled 2014-07-25 (×7): qty 1

## 2014-07-25 MED ORDER — ONDANSETRON HCL 4 MG/2ML IJ SOLN: 4.0000 mg | Freq: Four times a day (QID) | INTRAMUSCULAR | Status: DC | PRN

## 2014-07-25 NOTE — ED Notes (Signed)
Assisted Megan, RN with placing pt on bedpan; family stepped out of room

## 2014-07-25 NOTE — ED Notes (Signed)
Pt still eating her breakfast tray; no needs at this time

## 2014-07-25 NOTE — ED Notes (Signed)
Patient speaks itialian

## 2014-07-25 NOTE — H&P (Signed)
Patient Demographics  [name], is a 81 y.o. female  MRN: [id number]   DOB - 07/22/1931  Admit Date - 07/25/2014  Outpatient Primary MD for the patient is Oneal Grout, MD   With History of -  Past Medical History  Diagnosis Date  . Lumbar back pain   . Other urinary incontinence   . Memory loss   . DJD (degenerative joint disease)   . Depression   . Hypertension   . CVA (cerebral infarction)   . CHF (congestive heart failure)     Chronic Diastolic  . CAD (coronary artery disease)   . Presence of permanent cardiac pacemaker   . Osteoporosis   . Hypercholesterolemia   . Hyperthyroidism   . Anxiety   . Vertigo   . Tachy-brady syndrome   . Ischemic cardiomyopathy       Past Surgical History  Procedure Laterality Date  . Pacemaker insertion  2011  . Total knee arthroplasty  2002    bilateral  . Abdominal hysterectomy  1986  . Bilateral stenting  1999    to the RCA  . Cardiac catheterization  05/23/10  . Total abdominal hysterectomy w/ bilateral salpingoophorectomy      in for   Chief Complaint  Patient presents with  . Altered Mental Status  . Weakness     HPI  [name]  is a 81 y.o. female, with history of stroke in the past, chronic diastolic CHF or 55% on last echo, hyper thyroidism, osteoporosis, sick sinus syndrome status post pacemaker placement questionable history of A. fib not on anticoagulation, chronic low back pain, CAD, dyslipidemia, who lives with her son comes in after experiencing generalized weakness, inability to bear weight and ambulate by herself starting sometime yesterday evening, she denies any focal weakness or headache, can dose to generalized weakness, at home systolic blood pressure was in mid 70s, no fever chills, no chest pain palpitations, no cough  and shortness of breath, no abdominal pain or diarrhea, no dysuria. No focal weakness.  In the ER head CT was nonacute, lab work was unremarkable, UA was stable, EKG troponin were stable, she did have low blood pressure upon arrival and had difficulty standing up and bearing weight by herself, I was called to admit the patient. She did say that at times she had sensation of mild vertigo.    Review of Systems    In addition to the HPI above,   No Fever-chills, No Headache, No changes with Vision or hearing, No problems swallowing food or Liquids, No Chest pain, Cough or Shortness of Breath, No Abdominal pain, No Nausea or Vommitting, Bowel movements are regular, No Blood in stool or Urine, No dysuria, No new skin rashes or bruises, No new joints pains-aches,  No new weakness, tingling, numbness in any extremity, positive generalized weakness, some sensation of room spinning No recent weight gain or loss, No polyuria, polydypsia or polyphagia, No  significant Mental Stressors.  A full 10 point Review of Systems was done, except as stated above, all other Review of Systems were negative.   Social History History  Substance Use Topics  . Smoking status: Never Smoker   . Smokeless tobacco: Not on file  . Alcohol Use: No      Family History Family History  Problem Relation Age of Onset  . Thyroid disease Daughter   . Diabetes Other   . Hypertension Other   . Arthritis Other   . Coronary artery disease Mother 43  . Coronary artery disease Father 23      Prior to Admission medications   Medication Sig Start Date End Date Taking? Authorizing Provider  alendronate (FOSAMAX) 70 MG tablet Take 1 tablet (70 mg total) by mouth once a week. Take with a full glass of water on an empty stomach. 07/14/14  Yes Mahima Glade Lloyd, MD  Ascorbic Acid (VITAMIN C) 500 MG tablet Take 500 mg by mouth daily.     Yes Historical Provider, MD  aspirin ([facility] ASPIRIN LOW DOSE) 81 MG EC tablet TAKE 1  TABLET (81 MG TOTAL) BY MOUTH DAILY. SWALLOW WHOLE. 02/19/14  Yes Pricilla Riffle, MD  Calcium Carbonate-Vit D-Min 600-400 MG-UNIT TABS Take 1 tablet by mouth 2 (two) times daily.    Yes Historical Provider, MD  clopidogrel (PLAVIX) 75 MG tablet Take 1 tablet (75 mg total) by mouth daily. 02/13/14  Yes Pricilla Riffle, MD  lisinopril (PRINIVIL,ZESTRIL) 10 MG tablet Take 1 tablet (10 mg total) by mouth daily. 02/13/14  Yes Pricilla Riffle, MD  methimazole (TAPAZOLE) 5 MG tablet Take one tablet by mouth three days a week as prescribed by endocrinology 06/10/14  Yes Kimber Relic, MD  Multiple Vitamins-Minerals (CENTRUM SILVER) tablet Take 1 tablet by mouth daily.     Yes Historical Provider, MD  nitroGLYCERIN (NITROSTAT) 0.4 MG SL tablet Place 1 tablet (0.4 mg total) under the tongue every 5 (five) minutes as needed. For chest pain 03/31/14  Yes Mahima Glade Lloyd, MD  polyethylene glycol powder (GLYCOLAX/MIRALAX) powder MIX 1/2 CAPFUL WITH 8 OUNCES OF WATER AND TAKE AT BEDTIME AS NEEDED FOR CONSTIPATION 03/27/14  Yes Tiffany L Reed, DO  Propylene Glycol (SYSTANE BALANCE) 0.6 % SOLN Apply 1 drop to eye 2 (two) times daily. Patient taking differently: Apply 1 drop to eye 2 (two) times daily as needed (dry eyes).  01/07/14  Yes Mahima Glade Lloyd, MD  simvastatin (ZOCOR) 40 MG tablet Take 1 tablet (40 mg total) by mouth daily at 6 PM. 02/13/14  Yes Pricilla Riffle, MD  spironolactone (ALDACTONE) 25 MG tablet TAKE 1 TABLET DAILY 02/17/14  Yes Marinus Maw, MD    Allergies  Allergen Reactions  . Iodine Other (See Comments)    unknown  . Iohexol      Code: HIVES, Desc: hives with nonionic contrast 7 yrs ago ([place] iv benedryl given   . Peanut-Containing Drug Products     Physical Exam  Vitals  Blood pressure 120/78, pulse 67, temperature 98.4 F (36.9 C), temperature source Oral, resp. rate 16, height  (1.676 m), weight 81.647 kg (180 lb), SpO2 94 %.   1. General obese elderly white female lying in bed in NAD,       2. Normal affect and insight, Not Suicidal or Homicidal, Awake Alert, Oriented X 3.  3. No F.N deficits, ALL C.Nerves Intact, Strength 5/5 all 4 extremities, Sensation intact all 4 extremities, Plantars down going.  4. Ears and Eyes appear Normal, Conjunctivae clear, PERRLA. Moist Oral Mucosa.  5. Supple Neck, No JVD, No cervical lymphadenopathy appriciated, No Carotid Bruits.  6. Symmetrical Chest wall movement, Good air movement bilaterally, CTAB.  7. RRR, No Gallops, Rubs or Murmurs, No Parasternal Heave.  8. Positive Bowel Sounds, Abdomen Soft, No tenderness, No organomegaly appriciated,No rebound -guarding or rigidity.  9.  No Cyanosis, Normal Skin Turgor, No Skin Rash or Bruise.  10. Good muscle tone,  joints appear normal , no effusions, Normal ROM.  11. No Palpable Lymph Nodes in Neck or Axillae     Data Review  CBC  Recent Labs Lab 07/25/14 0335  WBC 8.4  HGB 14.0  HCT 42.2  PLT 213  MCV 90.2  MCH 29.9  MCHC 33.2  RDW 14.6   ------------------------------------------------------------------------------------------------------------------  Chemistries   Recent Labs Lab 07/25/14 0335  NA 140  K 3.9  CL 104  CO2 28  GLUCOSE 108*  BUN 19  CREATININE 0.86  CALCIUM 9.1  AST 25  ALT 15  ALKPHOS 44  BILITOT 0.8   ------------------------------------------------------------------------------------------------------------------ estimated creatinine clearance is 56.2 mL/min (by C-G formula based on Cr of 0.86). ------------------------------------------------------------------------------------------------------------------ No results for input(s): TSH, T4TOTAL, T3FREE, THYROIDAB in the last 72 hours.  Invalid input(s): FREET3   Coagulation profile No results for input(s): INR, PROTIME in the last 168 hours. ------------------------------------------------------------------------------------------------------------------- No results for input(s):  DDIMER in the last 72 hours. -------------------------------------------------------------------------------------------------------------------  Cardiac Enzymes No results for input(s): CKMB, TROPONINI, MYOGLOBIN in the last 168 hours.  Invalid input(s): CK ------------------------------------------------------------------------------------------------------------------ Invalid input(s): POCBNP   ---------------------------------------------------------------------------------------------------------------  Urinalysis    Component Value Date/Time   COLORURINE YELLOW 07/25/2014 0353   APPEARANCEUR CLEAR 07/25/2014 0353   LABSPEC 1.009 07/25/2014 0353   PHURINE 5.0 07/25/2014 0353   GLUCOSEU NEGATIVE 07/25/2014 0353   HGBUR NEGATIVE 07/25/2014 0353   BILIRUBINUR NEGATIVE 07/25/2014 0353   KETONESUR NEGATIVE 07/25/2014 0353   PROTEINUR NEGATIVE 07/25/2014 0353   UROBILINOGEN 0.2 07/25/2014 0353   NITRITE NEGATIVE 07/25/2014 0353   LEUKOCYTESUR NEGATIVE 07/25/2014 0353    ----------------------------------------------------------------------------------------------------------------  Imaging results:   Ct Head Wo Contrast  07/25/2014   CLINICAL DATA:  Weakness and altered mental status.  EXAM: CT HEAD WITHOUT CONTRAST  TECHNIQUE: Contiguous axial images were obtained from the base of the skull through the vertex without intravenous contrast.  COMPARISON:  02/15/2013  FINDINGS: Generalized atrophy and chronic small vessel ischemic change, stable from prior exam. Remote left cerebellar, left frontal lobe, left caudate, and right basal gangliar infarcts, unchanged. No intracranial hemorrhage, mass effect, or midline shift. No hydrocephalus. No evidence of territorial infarct. No intracranial fluid collection. Calvarium is intact. Chronic opacification of a single ethmoid air cell. Included paranasal sinuses and mastoid air cells are well aerated.  IMPRESSION: 1.  No acute intracranial  abnormality. 2. Stable atrophy and chronic small vessel ischemic change.   Electronically Signed   By: Rubye Oaks M.D.   On: 07/25/2014 04:59    My personal review of EKG: Rhythm paced rythm , no Acute ST changes    Assessment & Plan   1. Generalized weakness/dizziness-vertigo. Was hypertensive upon arrival, could be mildly dehydrated, she is on Aldactone and blood pressure medications. Will be kept on one day observation on a telemetry bed, IV fluids, hold diuretics and ARB, PT eval, increase activity, check TSH and monitor. Head CT was unremarkable. Since she has history of CVA we'll repeat head CT tomorrow. Cannot do MRI as she has  a pacemaker.   2. History of CVA. Exam nonfocal, head CT nonacute, continue aspirin and Plavix, continue statin for secondary prevention. Repeat head CT tomorrow as she has a pacemaker.   3. History of hyperthyroidism. Continue methimazole check TSH.   4. Chronic diastolic CHF. EF 50-55%. Mildly dehydrated clinically. Hold diuretic, hold ARB. Gentle hydration. Monitor orthostatics.   5. Sick sinus syndrome, questionable atrial fibrillation. Paced rhythm here, not on anticoagulation, monitor on telemetry. Outpatient follow-up with primary cardiologist postdischarge.     DVT Prophylaxis Heparin   AM Labs Ordered, also please review Full Orders  Family Communication: Admission, patients condition and plan of care including tests being ordered have been discussed with the patient and son who indicate understanding and agree with the plan and Code Status.  Code Status full  Likely DC to Home  Condition GUARDED     Time spent in minutes :35    Jaymir Struble K M.D on 07/25/2014 at 8:17 AM  Between 7am to 7pm - Pager - [phone]  After 7pm go to [email or web address] - password [facility]  [facility] Office  [phone]

## 2014-07-25 NOTE — ED Notes (Signed)
CBG is 98 

## 2014-07-25 NOTE — ED Notes (Signed)
Discussed with Dr. Norlene Campbell that patient was not able to hold her weight when standing.

## 2014-07-25 NOTE — ED Provider Notes (Signed)
CSN: [id number]     Arrival date & time 07/25/14  4098 History   First MD Initiated Contact with Patient 07/25/14 0325     This chart was scribed for Olivia Mackie, MD by Arlan Organ, ED Scribe. This patient was seen in room A04C/A04C and the patient's care was started 3:30 AM.   Chief Complaint  Patient presents with  . Altered Mental Status  . Weakness   HPI  HPI Comments: [name] brought in by EMS is a 81 y.o. female with a PMHx of DJD, HTN, CVA, CHF, CAD, hypercholesterolemia, and hyperthyroidism who presents to the Emergency Department here for altered mental status onset earlier today. Per EMS, pts family stated they noted a recent change in her behavior along with weakness. Per nurses note, pt attempted to use the bathroom but was too weak to get up. Son took blood pressure today with readings of 79/50 followed by 77/47. Currently pt admits to mild dizziness, vertigo. No recent known illness. Pt denies any fever, chills, nausea, vomiting, SOB, or chest pain. [name] has a history of "ministrokes" per her son. Pt with known allergies to Iodine and Iohexol.   Past Medical History  Diagnosis Date  . Lumbar back pain   . Other urinary incontinence   . Memory loss   . DJD (degenerative joint disease)   . Depression   . Hypertension   . CVA (cerebral infarction)   . CHF (congestive heart failure)     Chronic Diastolic  . CAD (coronary artery disease)   . Presence of permanent cardiac pacemaker   . Osteoporosis   . Hypercholesterolemia   . Hyperthyroidism   . Anxiety   . Vertigo   . Tachy-brady syndrome   . Ischemic cardiomyopathy    Past Surgical History  Procedure Laterality Date  . Pacemaker insertion  2011  . Total knee arthroplasty  2002    bilateral  . Abdominal hysterectomy  1986  . Bilateral stenting  1999    to the RCA  . Cardiac catheterization  05/23/10  . Total abdominal hysterectomy w/ bilateral salpingoophorectomy     Family History  Problem  Relation Age of Onset  . Thyroid disease Daughter   . Diabetes Other   . Hypertension Other   . Arthritis Other   . Coronary artery disease Mother 74  . Coronary artery disease Father 20   History  Substance Use Topics  . Smoking status: Never Smoker   . Smokeless tobacco: Not on file  . Alcohol Use: No   OB History    No data available     Review of Systems  Constitutional: Negative for fever and chills.  Respiratory: Negative for shortness of breath.   Cardiovascular: Negative for chest pain.  Gastrointestinal: Negative for nausea and vomiting.  Neurological: Positive for dizziness and weakness.  All other systems reviewed and are negative.     Allergies  Iodine; Iohexol; and Peanut-containing drug products  Home Medications   Prior to Admission medications   Medication Sig Start Date End Date Taking? Authorizing Provider  alendronate (FOSAMAX) 70 MG tablet Take 1 tablet (70 mg total) by mouth once a week. Take with a full glass of water on an empty stomach. 07/14/14   Oneal Grout, MD  Ascorbic Acid (VITAMIN C) 500 MG tablet Take 500 mg by mouth daily.      Historical Provider, MD  aspirin ([facility] ASPIRIN LOW DOSE) 81 MG EC tablet TAKE 1 TABLET (81 MG TOTAL)  BY MOUTH DAILY. SWALLOW WHOLE. 02/19/14   Pricilla Riffle, MD  Calcium Carbonate-Vit D-Min 600-400 MG-UNIT TABS Take 1 tablet by mouth 2 (two) times daily.     Historical Provider, MD  clopidogrel (PLAVIX) 75 MG tablet Take 1 tablet (75 mg total) by mouth daily. 02/13/14   Pricilla Riffle, MD  lisinopril (PRINIVIL,ZESTRIL) 10 MG tablet Take 1 tablet (10 mg total) by mouth daily. 02/13/14   Pricilla Riffle, MD  methimazole (TAPAZOLE) 5 MG tablet Take one tablet by mouth three days a week as prescribed by endocrinology 06/10/14   Kimber Relic, MD  Multiple Vitamins-Minerals (CENTRUM SILVER) tablet Take 1 tablet by mouth daily.      Historical Provider, MD  nitroGLYCERIN (NITROSTAT) 0.4 MG SL tablet Place 1 tablet (0.4 mg total)  under the tongue every 5 (five) minutes as needed. For chest pain 03/31/14   Oneal Grout, MD  polyethylene glycol powder (GLYCOLAX/MIRALAX) powder MIX 1/2 CAPFUL WITH 8 OUNCES OF WATER AND TAKE AT BEDTIME AS NEEDED FOR CONSTIPATION 03/27/14   Tiffany L Reed, DO  Propylene Glycol (SYSTANE BALANCE) 0.6 % SOLN Apply 1 drop to eye 2 (two) times daily. 01/07/14   Oneal Grout, MD  simvastatin (ZOCOR) 40 MG tablet Take 1 tablet (40 mg total) by mouth daily at 6 PM. 02/13/14   Pricilla Riffle, MD  spironolactone (ALDACTONE) 25 MG tablet TAKE 1 TABLET DAILY 02/17/14   Marinus Maw, MD   Triage Vitals: BP 152/66 mmHg  Pulse 64  Temp(Src) 98.4 F (36.9 C) (Oral)  Resp 19  Ht  (1.676 m)  Wt 180 lb (81.647 kg)  BMI 29.07 kg/m2  SpO2 97%   Physical Exam  Constitutional: She appears well-developed and well-nourished. No distress.  HENT:  Head: Normocephalic and atraumatic.  Nose: Nose normal.  Mouth/Throat: Oropharynx is clear and moist. No oropharyngeal exudate.  Eyes: Conjunctivae and EOM are normal. Pupils are equal, round, and reactive to light.  Neck: Normal range of motion. Neck supple. No JVD present. No tracheal deviation present. No thyromegaly present.  Cardiovascular: Normal rate, regular rhythm, normal heart sounds and intact distal pulses.  Exam reveals no gallop and no friction rub.   No murmur heard. Pulmonary/Chest: Effort normal and breath sounds normal. No stridor. No respiratory distress. She has no wheezes. She has no rales. She exhibits no tenderness.  Abdominal: Soft. Bowel sounds are normal. She exhibits no distension and no mass. There is no tenderness. There is no rebound and no guarding.  Musculoskeletal: Normal range of motion. She exhibits no edema or tenderness.  Lymphadenopathy:    She has no cervical adenopathy.  Neurological: She is alert. She displays normal reflexes. No cranial nerve deficit. She exhibits normal muscle tone. Coordination abnormal.  Vertigo with  spinning of the room to left.  Irregular finger-nose-finger, but this is unclear if it is due to language barrier and dementia No facial droop noted.  Skin: Skin is warm and dry. No rash noted. No erythema. No pallor.  Psychiatric: She has a normal mood and affect. Her behavior is normal.  Nursing note and vitals reviewed.   ED Course  Procedures (including critical care time)  DIAGNOSTIC STUDIES: Oxygen Saturation is 97% on RA, adequate by my interpretation.    COORDINATION OF CARE: 3:41 AM-Discussed treatment plan with pt at bedside and pt agreed to plan.  a   Labs Review Labs Reviewed  COMPREHENSIVE METABOLIC PANEL - Abnormal; Notable for the following:  Glucose, Bld 108 (*)    GFR calc non Af Amer 62 (*)    GFR calc Af Amer 72 (*)    All other components within normal limits  CBC  URINALYSIS, ROUTINE W REFLEX MICROSCOPIC  CBG MONITORING, ED  I-STAT CG4 LACTIC ACID, ED    Imaging Review Ct Head Wo Contrast  07/25/2014   CLINICAL DATA:  Weakness and altered mental status.  EXAM: CT HEAD WITHOUT CONTRAST  TECHNIQUE: Contiguous axial images were obtained from the base of the skull through the vertex without intravenous contrast.  COMPARISON:  02/15/2013  FINDINGS: Generalized atrophy and chronic small vessel ischemic change, stable from prior exam. Remote left cerebellar, left frontal lobe, left caudate, and right basal gangliar infarcts, unchanged. No intracranial hemorrhage, mass effect, or midline shift. No hydrocephalus. No evidence of territorial infarct. No intracranial fluid collection. Calvarium is intact. Chronic opacification of a single ethmoid air cell. Included paranasal sinuses and mastoid air cells are well aerated.  IMPRESSION: 1.  No acute intracranial abnormality. 2. Stable atrophy and chronic small vessel ischemic change.   Electronically Signed   By: Rubye Oaks M.D.   On: 07/25/2014 04:59     EKG Interpretation   Date/Time:  Saturday July 25 2014  04:01:02 EST Ventricular Rate:  68 PR Interval:  214 QRS Duration: 107 QT Interval:  438 QTC Calculation: 466 R Axis:   -49 Text Interpretation:  Atrial-paced complexes Borderline prolonged PR  interval Incomplete left bundle branch block LVH with secondary  repolarization abnormality No significant change since last tracing  Confirmed by Joycie Aerts  MD, Kayleeann Huxford ([id number]) on 07/25/2014 4:15:14 AM      MDM   Final diagnoses:  Weakness    81 year old female, history of stroke who presents with vertigo and difficulties with balance.  It is unclear onset, but they have been as long as lunchtime yesterday.  Symptoms worsened overnight.  Concern for posterior circulation stroke.  Patient last had stroke workup done in 2013.  Patient has pacemaker limiting her MRI.  She has allergy to contrast, and has been unable to get a CT angiogram of the head.  She has history of atrial flutter, but is not on Coumadin secondary to falls.   I personally performed the services described in this documentation, which was scribed in my presence. The recorded information has been reviewed and is accurate.  5:32 AM Workup thus far has not shown any specific cause for patient's symptoms.  Her head CT without new stroke.  No hypotension here in the emergency department.  Awaiting orthostatics.  Will discuss with neurology, but as she had workup done in 2013 unsure if she needs further evaluation.  6:03 AM Orthostatics, without hypertension, however patient is unable to stand or walk unassisted due to weakness.  I do not feel this is a primary neurologic problem, will talk with hospitalist for admission.  Olivia Mackie, MD 07/25/14 [phone]

## 2014-07-25 NOTE — ED Notes (Signed)
Attempted report 

## 2014-07-25 NOTE — ED Notes (Signed)
Per EMS, the patient's family was concerned because patient's behavior has changed. She also tried to use the restroom, but was too weak to go.  Hx of ministrokes in the past, and family believes she had a ministroke that turned into a CVA on Friday morning.  VS with EMS: bp 128/96, p 94. 20g present in right hand.

## 2014-07-25 NOTE — Progress Notes (Signed)
[name] Interpreters for patient. Spoke with [name] [phone]. Patient had questions regarding Heparin therapy. All questions answered by interpreter. Patient agreeable to therapies. Will continue to monitor.

## 2014-07-25 NOTE — ED Notes (Signed)
Assisted Marylene Land, RN with readjusting pt on stretcher and getting her comfortable; family at bedside

## 2014-07-26 ENCOUNTER — Observation Stay (HOSPITAL_COMMUNITY): Payer: Medicare Other

## 2014-07-26 DIAGNOSIS — I5032 Chronic diastolic (congestive) heart failure: Secondary | ICD-10-CM

## 2014-07-26 DIAGNOSIS — I951 Orthostatic hypotension: Secondary | ICD-10-CM

## 2014-07-26 DIAGNOSIS — H8190 Unspecified disorder of vestibular function, unspecified ear: Secondary | ICD-10-CM | POA: Insufficient documentation

## 2014-07-26 DIAGNOSIS — R531 Weakness: Secondary | ICD-10-CM

## 2014-07-26 LAB — BASIC METABOLIC PANEL
ANION GAP: 7 (ref 5–15)
BUN: 22 mg/dL (ref 6–23)
CALCIUM: 8.9 mg/dL (ref 8.4–10.5)
CO2: 25 mmol/L (ref 19–32)
Chloride: 108 mmol/L (ref 96–112)
Creatinine, Ser: 0.78 mg/dL (ref 0.50–1.10)
GFR calc Af Amer: 89 mL/min — ABNORMAL LOW (ref >90)
GFR, EST NON AFRICAN AMERICAN: 77 mL/min — AB (ref >90)
GLUCOSE: 111 mg/dL — AB (ref 70–99)
POTASSIUM: 4 mmol/L (ref 3.5–5.1)
SODIUM: 140 mmol/L (ref 135–145)

## 2014-07-26 LAB — CBC
HCT: 39 % (ref 36.0–46.0)
HEMOGLOBIN: 12.8 g/dL (ref 12.0–15.0)
MCH: 29.9 pg (ref 26.0–34.0)
MCHC: 32.8 g/dL (ref 30.0–36.0)
MCV: 91.1 fL (ref 78.0–100.0)
Platelets: 207 10*3/uL (ref 150–400)
RBC: 4.28 MIL/uL (ref 3.87–5.11)
RDW: 14.8 % (ref 11.5–15.5)
WBC: 7.7 10*3/uL (ref 4.0–10.5)

## 2014-07-26 LAB — VITAMIN B12: VITAMIN B 12: 562 pg/mL (ref 211–911)

## 2014-07-26 LAB — TROPONIN I: Troponin I: <0.03 ng/mL (ref <0.031)

## 2014-07-26 MED ORDER — SODIUM CHLORIDE 0.9 % IV SOLN
INTRAVENOUS | Status: DC
  Administered 2014-07-26 (×2): via INTRAVENOUS
  Filled 2014-07-26: qty 1000

## 2014-07-26 MED ORDER — DIPHENHYDRAMINE HCL 25 MG PO CAPS
25.0000 mg | ORAL_CAPSULE | Freq: Once | ORAL | Status: AC
  Administered 2014-07-26: 25 mg via ORAL
  Filled 2014-07-26: qty 1

## 2014-07-26 NOTE — Evaluation (Signed)
Physical Therapy Evaluation Patient Details Name: [name] MRN: [id number] DOB: 1931/12/30 Today's Date: 07/26/2014   History of Present Illness  81 year old female with a history of stroke, diastolic CHF, sick sinus syndrome, coronary artery disease presented with 1 day history of dizziness, generalized weakness to the point of inability to ambulate. At home, the patient's son checked her blood pressure and it was noted to be 77/49 with heart rate in the 60s.  Clinical Impression  Pt admitted with above diagnosis. Pt currently with functional limitations due to the deficits listed below (see PT Problem List).  Pt will benefit from skilled PT to increase their independence and safety with mobility to allow discharge to the venue listed below.       Follow Up Recommendations SNF;Supervision/Assistance - 24 hour    Equipment Recommendations  None recommended by PT    Recommendations for Other Services       Precautions / Restrictions Precautions Precautions: Fall      Mobility  Bed Mobility Overal bed mobility: Needs Assistance Bed Mobility: Supine to Sit;Sit to Supine     Supine to sit: Min assist Sit to supine: Min assist   General bed mobility comments: verbal and tactile cues for sequencing.  Transfers Overall transfer level: Needs assistance Equipment used: Rolling walker (2 wheeled) Transfers: Sit to/from [employer] Sit to Stand: Min assist Stand pivot transfers: Min assist       General transfer comment: verbal/tactile cues for sequencing and safety  Ambulation/Gait Ambulation/Gait assistance: Min assist Ambulation Distance (Feet): 5 Feet Assistive device: Rolling walker (2 wheeled) Gait Pattern/deviations: Step-through pattern;Decreased stride length Gait velocity: decreased      Stairs            Wheelchair Mobility    Modified Rankin (Stroke Patients Only)       Balance                                              Pertinent Vitals/Pain Pain Assessment: No/denies pain    Home Living Family/patient expects to be discharged to:: Private residence Living Arrangements: Spouse/significant other;Children Available Help at Discharge: Family;Available PRN/intermittently Type of Home: Apartment Home Access: Level entry     Home Layout: One level Home Equipment: Shower seat - built in;Shower seat;Grab bars - toilet;Hand held Careers information officer - 2 wheels Additional Comments: Pt with h/o dementia and goes between speaking [place] and [place].    Prior Function Level of Independence: Needs assistance   Gait / Transfers Assistance Needed: mod I with RW  ADL's / Homemaking Assistance Needed: assist with BADLs  Comments: Husband is on O2. Son lives next door but works during the day.     Hand Dominance   Dominant Hand: Right    Extremity/Trunk Assessment   Upper Extremity Assessment: Generalized weakness           Lower Extremity Assessment: Generalized weakness      Cervical / Trunk Assessment: Normal  Communication   Communication: Interpreter utilized;Prefers language other than English [employer], #[id number])  Cognition Arousal/Alertness: Awake/alert Behavior During Therapy: WFL for tasks assessed/performed Overall Cognitive Status: Difficult to assess       Memory: Decreased short-term memory              General Comments      Exercises        Assessment/Plan  PT Assessment Patient needs continued PT services  PT Diagnosis Difficulty walking;Generalized weakness   PT Problem List Decreased strength;Decreased activity tolerance;Decreased balance;Decreased mobility;Decreased safety awareness  PT Treatment Interventions Gait training;Functional mobility training;Therapeutic activities;Therapeutic exercise;Patient/family education;Balance training   PT Goals (Current goals can be found in the Care Plan section) Acute Rehab PT  Goals Patient Stated Goal: not stated PT Goal Formulation: Patient unable to participate in goal setting Time For Goal Achievement: 08/09/14 Potential to Achieve Goals: Good    Frequency Min 2X/week   Barriers to discharge Decreased caregiver support      Co-evaluation               End of Session Equipment Utilized During Treatment: Gait belt Activity Tolerance: Patient limited by fatigue Patient left: in bed;with call bell/phone within reach;with bed alarm set Nurse Communication: Mobility status    Functional Assessment Tool Used: clinical judgement Functional Limitation: Mobility: Walking and moving around Mobility: Walking and Moving Around Current Status (Z6109): At least 20 percent but less than 40 percent impaired, limited or restricted Mobility: Walking and Moving Around Goal Status [phone]): At least 1 percent but less than 20 percent impaired, limited or restricted    Time: 1147-1214 PT Time Calculation (min) (ACUTE ONLY): 27 min   Charges:   PT Evaluation $Initial PT Evaluation Tier I: 1 Procedure PT Treatments $Therapeutic Activity: 8-22 mins   PT G Codes:   PT G-Codes **NOT FOR INPATIENT CLASS** Functional Assessment Tool Used: clinical judgement Functional Limitation: Mobility: Walking and moving around Mobility: Walking and Moving Around Current Status (U9811): At least 20 percent but less than 40 percent impaired, limited or restricted Mobility: Walking and Moving Around Goal Status [phone]): At least 1 percent but less than 20 percent impaired, limited or restricted    Ilda Foil 07/26/2014, 2:54 PM

## 2014-07-26 NOTE — Progress Notes (Signed)
UR completed 

## 2014-07-26 NOTE — Progress Notes (Signed)
PROGRESS NOTE  [name] PZW:[id number] DOB: 1932-01-04 DOA: 07/25/2014 PCP: Oneal Grout, MD  Brief history 81 year old female with a history of stroke, diastolic CHF, sick sinus syndrome, coronary artery disease presented with 1 day history of dizziness, generalized weakness to the point of inability to ambulate. At home, the patient's son checked her blood pressure and it was noted to be 77/49 with heart rate in the 60s. She was brought to the emergency department where she was noted to be mildly hypotensive. She was given fluids. The patient denies any fevers, chills, chest pain, shortness breath, vomiting, diarrhea, abdominal pain, dysuria, hematuria. There's been no back pain. There's no changes in medications.  The patient did not have any mechanical fall or syncope. There has not been any dysarthria or focal extremity weakness. Assessment/Plan: Orthostatic hypotension -07/25/2014--orthostatic vital signs positive -Continue IV fluids -Echocardiogram -Blood pressure has improved -CXR Dizziness -Likely due to volume depletion and orthostatic hypotension -Continue judicious IV fluids -CT brain negative for acute findings 2 -Unable to obtain MRI secondary to pacemaker -Hold Aldactone and lisinopril -Underlying rhythm is sinus; review of telemetry did not reveal any concerning dysrhythmia -Urinalysis negative for polyuria Generalized weakness -serum B12 -TSH 0.589 -PT eval Chronic diastolic CHF -Appears clinically compensated -02/16/2013 echocardiogram EF 50-55%, grade 1 diastolic dysfunction History of stroke -continue ASA/plavix Hyperthyroidism -continue Tapazole  Family Communication:   Updated son on phone 07/26/14 Disposition Plan:   Home when medically stable        Procedures/Studies: Ct Head Wo Contrast  07/26/2014   CLINICAL DATA:  Vertiginous syndrome and headache.  EXAM: CT HEAD WITHOUT CONTRAST  TECHNIQUE: Contiguous axial images were  obtained from the base of the skull through the vertex without intravenous contrast.  COMPARISON:  07/25/2014 and 02/15/2013  FINDINGS: Ventricles, cisterns and other CSF spaces are within normal. There is moderate chronic ischemic microvascular disease unchanged. Possible small old left frontal infarct unchanged. No evidence of focal mass, mass effect, shift of midline structures or acute hemorrhage. No acute infarction. Remaining bones and soft tissues are unremarkable.  IMPRESSION: No acute intracranial findings.  Moderate chronic ischemic microvascular disease. Possible small old left frontal infarct unchanged.   Electronically Signed   By: Elberta Fortis M.D.   On: 07/26/2014 07:41   Ct Head Wo Contrast  07/25/2014   CLINICAL DATA:  Weakness and altered mental status.  EXAM: CT HEAD WITHOUT CONTRAST  TECHNIQUE: Contiguous axial images were obtained from the base of the skull through the vertex without intravenous contrast.  COMPARISON:  02/15/2013  FINDINGS: Generalized atrophy and chronic small vessel ischemic change, stable from prior exam. Remote left cerebellar, left frontal lobe, left caudate, and right basal gangliar infarcts, unchanged. No intracranial hemorrhage, mass effect, or midline shift. No hydrocephalus. No evidence of territorial infarct. No intracranial fluid collection. Calvarium is intact. Chronic opacification of a single ethmoid air cell. Included paranasal sinuses and mastoid air cells are well aerated.  IMPRESSION: 1.  No acute intracranial abnormality. 2. Stable atrophy and chronic small vessel ischemic change.   Electronically Signed   By: Rubye Oaks M.D.   On: 07/25/2014 04:59         Subjective:  patient continues complaining of dizziness but it is improved from yesterday. Denies any headache, chest pain concerns breath, nausea, vomiting, diarrhea,, pain.  Objective: Filed Vitals:   07/25/14 1435 07/25/14 2204 07/26/14 0542 07/26/14 0545  BP: 158/107 120/57   135/70  Pulse: 89   69  Temp:    97.6 F (36.4 C)  TempSrc:    Oral  Resp: Height:      Weight:   82.5 kg (181 lb 14.1 oz)   SpO2: 95% 96%  95%    Intake/Output Summary (Last 24 hours) at 07/26/14 1105 Last data filed at 07/26/14 0951  Gross per 24 hour  Intake 1903.75 ml  Output    700 ml  Net 1203.75 ml   Weight change: 0.852 kg (1 lb 14.1 oz) Exam:   General:  Pt is alert, follows commands appropriately, not in acute distress  HEENT: No icterus, No thrush,[place]/AT  Cardiovascular: RRR, S1/S2, no rubs, no gallops  Respiratory: CTA bilaterally, no wheezing, no crackles, no rhonchi  Abdomen: Soft/+BS, non tender, non distended, no guarding  Extremities: No edema, No lymphangitis, No petechiae, No rashes, no synovitis  Data Reviewed: Basic Metabolic Panel:  Recent Labs Lab 07/25/14 0335 07/26/14 0601  NA 140 140  K 3.9 4.0  CL 104 108  CO2 28 25  GLUCOSE 108* 111*  BUN 19 22  CREATININE 0.86 0.78  CALCIUM 9.1 8.9   Liver Function Tests:  Recent Labs Lab 07/25/14 0335  AST 25  ALT 15  ALKPHOS 44  BILITOT 0.8  PROT 6.7  ALBUMIN 3.6   No results for input(s): LIPASE, AMYLASE in the last 168 hours. No results for input(s): AMMONIA in the last 168 hours. CBC:  Recent Labs Lab 07/25/14 0335 07/26/14 0601  WBC 8.4 7.7  HGB 14.0 12.8  HCT 42.2 39.0  MCV 90.2 91.1  PLT 213 207   Cardiac Enzymes: No results for input(s): CKTOTAL, CKMB, CKMBINDEX, TROPONINI in the last 168 hours. BNP: Invalid input(s): POCBNP CBG:  Recent Labs Lab 07/25/14 0455  GLUCAP 98    No results found for this or any previous visit (from the past 240 hour(s)).   Scheduled Meds: . aspirin  81 mg Oral Daily  . calcium-vitamin D  1 tablet Oral BID  . clopidogrel  75 mg Oral Daily  . heparin  5,000 Units Subcutaneous 3 times per day  . methimazole  5 mg Oral Daily  . polyethylene glycol  17 g Oral BID  . simvastatin  40 mg Oral q1800  . sodium chloride   3 mL Intravenous Q12H  . vitamin C  500 mg Oral Daily   Continuous Infusions:    Leeza Heiner, DO  [facility] Pager [phone]  If 7PM-7AM, please contact night-coverage [email or web address] Password [email or web address] 07/26/2014, 11:05 AM   LOS: 1 day

## 2014-07-27 DIAGNOSIS — Z950 Presence of cardiac pacemaker: Secondary | ICD-10-CM | POA: Diagnosis not present

## 2014-07-27 DIAGNOSIS — I509 Heart failure, unspecified: Secondary | ICD-10-CM

## 2014-07-27 DIAGNOSIS — Z7983 Long term (current) use of bisphosphonates: Secondary | ICD-10-CM | POA: Diagnosis not present

## 2014-07-27 DIAGNOSIS — I255 Ischemic cardiomyopathy: Secondary | ICD-10-CM | POA: Diagnosis present

## 2014-07-27 DIAGNOSIS — Z7902 Long term (current) use of antithrombotics/antiplatelets: Secondary | ICD-10-CM | POA: Diagnosis not present

## 2014-07-27 DIAGNOSIS — R4182 Altered mental status, unspecified: Secondary | ICD-10-CM | POA: Diagnosis present

## 2014-07-27 DIAGNOSIS — I951 Orthostatic hypotension: Secondary | ICD-10-CM | POA: Diagnosis present

## 2014-07-27 DIAGNOSIS — E0590 Thyrotoxicosis, unspecified without thyrotoxic crisis or storm: Secondary | ICD-10-CM | POA: Diagnosis present

## 2014-07-27 DIAGNOSIS — I5032 Chronic diastolic (congestive) heart failure: Secondary | ICD-10-CM | POA: Diagnosis present

## 2014-07-27 DIAGNOSIS — E860 Dehydration: Secondary | ICD-10-CM | POA: Diagnosis present

## 2014-07-27 DIAGNOSIS — Z96653 Presence of artificial knee joint, bilateral: Secondary | ICD-10-CM | POA: Diagnosis not present

## 2014-07-27 DIAGNOSIS — I10 Essential (primary) hypertension: Secondary | ICD-10-CM | POA: Diagnosis present

## 2014-07-27 DIAGNOSIS — I2510 Atherosclerotic heart disease of native coronary artery without angina pectoris: Secondary | ICD-10-CM | POA: Diagnosis present

## 2014-07-27 DIAGNOSIS — M810 Age-related osteoporosis without current pathological fracture: Secondary | ICD-10-CM | POA: Diagnosis present

## 2014-07-27 DIAGNOSIS — Z8673 Personal history of transient ischemic attack (TIA), and cerebral infarction without residual deficits: Secondary | ICD-10-CM | POA: Diagnosis not present

## 2014-07-27 DIAGNOSIS — M1990 Unspecified osteoarthritis, unspecified site: Secondary | ICD-10-CM | POA: Diagnosis present

## 2014-07-27 DIAGNOSIS — Z7982 Long term (current) use of aspirin: Secondary | ICD-10-CM | POA: Diagnosis not present

## 2014-07-27 LAB — TROPONIN I: TROPONIN I: 0.03 ng/mL (ref <0.031)

## 2014-07-27 LAB — BASIC METABOLIC PANEL
Anion gap: 4 — ABNORMAL LOW (ref 5–15)
BUN: 17 mg/dL (ref 6–23)
CALCIUM: 9.5 mg/dL (ref 8.4–10.5)
CO2: 31 mmol/L (ref 19–32)
Chloride: 105 mmol/L (ref 96–112)
Creatinine, Ser: 0.86 mg/dL (ref 0.50–1.10)
GFR calc Af Amer: 72 mL/min — ABNORMAL LOW (ref >90)
GFR, EST NON AFRICAN AMERICAN: 62 mL/min — AB (ref >90)
Glucose, Bld: 111 mg/dL — ABNORMAL HIGH (ref 70–99)
Potassium: 3.9 mmol/L (ref 3.5–5.1)
Sodium: 140 mmol/L (ref 135–145)

## 2014-07-27 LAB — MAGNESIUM: Magnesium: 1.9 mg/dL (ref 1.5–2.5)

## 2014-07-27 MED ORDER — PERFLUTREN LIPID MICROSPHERE
1.0000 mL | INTRAVENOUS | Status: AC | PRN
  Administered 2014-07-27: 2 mL via INTRAVENOUS
  Filled 2014-07-27: qty 10

## 2014-07-27 MED ORDER — SODIUM CHLORIDE 0.9 % IV SOLN
INTRAVENOUS | Status: DC
  Administered 2014-07-27: 13:00:00 via INTRAVENOUS

## 2014-07-27 NOTE — Progress Notes (Signed)
  Echocardiogram 2D Echocardiogram has been performed.  Delcie Roch 07/27/2014, 6:14 PM

## 2014-07-27 NOTE — Progress Notes (Signed)
PROGRESS NOTE  JENNINGS STIRLING ZOX:[id number] DOB: August 05, 1931 DOA: 07/25/2014 PCP: Oneal Grout, MD   Brief history 81 year old female with a history of stroke, diastolic CHF, sick sinus syndrome, coronary artery disease presented with 1 day history of dizziness, generalized weakness to the point of inability to ambulate. Apparently, the patient has had some generalized weakness with difficulty ambulating for a number of days. At home, the patient's son checked her blood pressure and it was noted to be 77/49 with heart rate in the 60s. She was given fluids in ED. The patient denies any fevers, chills, chest pain, shortness breath, vomiting, diarrhea, abdominal pain, dysuria, hematuria. There's been no back pain. There's no changes in medications. The patient did not have any mechanical fall or syncope. There has not been any visual disturbance, dysarthria or focal extremity weakness. Assessment/Plan: Orthostatic hypotension/Dehydration -07/25/2014--orthostatic vital signs positive -Continue IV fluids--increase fluids to 75cc/hr -Echocardiogram -Blood pressure has improved -CXR--neg for edema or infiltrates Dizziness -Likely due to volume depletion and orthostatic hypotension -Continue judicious IV fluids -CT brain negative for acute findings 2 -Unable to obtain MRI secondary to pacemaker -Hold Aldactone and lisinopril -Underlying rhythm is sinus; review of telemetry did not reveal any concerning dysrhythmia -Urinalysis negative for pyuria Generalized weakness -serum B12--562 -TSH 0.589 -PT eval-->recommend SNF -UA neg for pyuria Chronic diastolic CHF -Appears clinically compensated -02/16/2013 echocardiogram EF 50-55%, grade 1 diastolic dysfunction Atrial Flutter history -not AC candidate due to falls -presently in sinus History of stroke -continue ASA/plavix Hyperthyroidism -continue Tapazole  Family Communication: Updated son on phone 07/27/14--total time  , >50% spent counseling pt and coordinating care Disposition Plan: SNF when medically stable   Procedures/Studies: Ct Head Wo Contrast  07/26/2014   CLINICAL DATA:  Vertiginous syndrome and headache.  EXAM: CT HEAD WITHOUT CONTRAST  TECHNIQUE: Contiguous axial images were obtained from the base of the skull through the vertex without intravenous contrast.  COMPARISON:  07/25/2014 and 02/15/2013  FINDINGS: Ventricles, cisterns and other CSF spaces are within normal. There is moderate chronic ischemic microvascular disease unchanged. Possible small old left frontal infarct unchanged. No evidence of focal mass, mass effect, shift of midline structures or acute hemorrhage. No acute infarction. Remaining bones and soft tissues are unremarkable.  IMPRESSION: No acute intracranial findings.  Moderate chronic ischemic microvascular disease. Possible small old left frontal infarct unchanged.   Electronically Signed   By: Elberta Fortis M.D.   On: 07/26/2014 07:41   Ct Head Wo Contrast  07/25/2014   CLINICAL DATA:  Weakness and altered mental status.  EXAM: CT HEAD WITHOUT CONTRAST  TECHNIQUE: Contiguous axial images were obtained from the base of the skull through the vertex without intravenous contrast.  COMPARISON:  02/15/2013  FINDINGS: Generalized atrophy and chronic small vessel ischemic change, stable from prior exam. Remote left cerebellar, left frontal lobe, left caudate, and right basal gangliar infarcts, unchanged. No intracranial hemorrhage, mass effect, or midline shift. No hydrocephalus. No evidence of territorial infarct. No intracranial fluid collection. Calvarium is intact. Chronic opacification of a single ethmoid air cell. Included paranasal sinuses and mastoid air cells are well aerated.  IMPRESSION: 1.  No acute intracranial abnormality. 2. Stable atrophy and chronic small vessel ischemic change.   Electronically Signed   By: Rubye Oaks M.D.   On: 07/25/2014 04:59   Dg Chest Port 1  View  07/26/2014   CLINICAL DATA:  Initial evaluation for congestive heart failure  EXAM: PORTABLE CHEST -  1 VIEW  COMPARISON:  02/13/2013  FINDINGS: Limited inspiratory effect. Elevated right diaphragm stable. Mild to moderate cardiac enlargement stable. Uncoiling and calcification of the aorta stable. 2 lead cardiac pacer stable. Vascular pattern normal. Lungs clear.  IMPRESSION: No acute findings   Electronically Signed   By: Esperanza Heir M.D.   On: 07/26/2014 17:15         Subjective: Patient denies fevers, chills, headache, chest pain, dyspnea, nausea, vomiting, diarrhea, abdominal pain, dysuria, hematuria   Objective: Filed Vitals:   07/27/14 0550 07/27/14 1021 07/27/14 1024 07/27/14 1027  BP: 144/74 153/76 119/91 142/88  Pulse:  65 92 91  Temp: 97.7 F (36.5 C) 97.5 F (36.4 C)    TempSrc: Oral Oral    Resp: 16 16    Height:      Weight: 82 kg (180 lb 12.4 oz)     SpO2: 94% 98% 99% 98%    Intake/Output Summary (Last 24 hours) at 07/27/14 1211 Last data filed at 07/27/14 1037  Gross per 24 hour  Intake  902.5 ml  Output      0 ml  Net  902.5 ml   Weight change: -0.5 kg (-1 lb 1.6 oz) Exam:   General:  Pt is alert, follows commands appropriately, not in acute distress  HEENT: No icterus, No thrush,  [place]/AT  Cardiovascular: RRR, S1/S2, no rubs, no gallops  Respiratory: CTA bilaterally, no wheezing, no crackles, no rhonchi  Abdomen: Soft/+BS, non tender, non distended, no guarding  Extremities: No edema, No lymphangitis, No petechiae, No rashes, no synovitis  Data Reviewed: Basic Metabolic Panel:  Recent Labs Lab 07/25/14 0335 07/26/14 0601 07/27/14 0632  NA 140 140 140  K 3.9 4.0 3.9  CL 104 108 105  CO2 28 25 31   GLUCOSE 108* 111* 111*  BUN 19 22 17   CREATININE 0.86 0.78 0.86  CALCIUM 9.1 8.9 9.5  MG  --   --  1.9   Liver Function Tests:  Recent Labs Lab 07/25/14 0335  AST 25  ALT 15  ALKPHOS 44  BILITOT 0.8  PROT 6.7  ALBUMIN 3.6     No results for input(s): LIPASE, AMYLASE in the last 168 hours. No results for input(s): AMMONIA in the last 168 hours. CBC:  Recent Labs Lab 07/25/14 0335 07/26/14 0601  WBC 8.4 7.7  HGB 14.0 12.8  HCT 42.2 39.0  MCV 90.2 91.1  PLT 213 207   Cardiac Enzymes:  Recent Labs Lab 07/26/14 1310 07/26/14 1710 07/26/14 2353  TROPONINI <0.03 <0.03 0.03   BNP: Invalid input(s): POCBNP CBG:  Recent Labs Lab 07/25/14 0455  GLUCAP 98    No results found for this or any previous visit (from the past 240 hour(s)).   Scheduled Meds: . aspirin  81 mg Oral Daily  . calcium-vitamin D  1 tablet Oral BID  . clopidogrel  75 mg Oral Daily  . heparin  5,000 Units Subcutaneous 3 times per day  . methimazole  5 mg Oral Daily  . polyethylene glycol  17 g Oral BID  . simvastatin  40 mg Oral q1800  . sodium chloride  3 mL Intravenous Q12H  . vitamin C  500 mg Oral Daily   Continuous Infusions: . sodium chloride    . sodium chloride 0.9 % 1,000 mL infusion 50 mL/hr at 07/26/14 2054     Dorwin Fitzhenry, DO  [facility] Pager [phone]  If 7PM-7AM, please contact night-coverage [email or web address] Password [email or web address] 07/27/2014, 12:11 PM  LOS: 2 days

## 2014-07-28 ENCOUNTER — Inpatient Hospital Stay (HOSPITAL_COMMUNITY): Payer: Medicare Other

## 2014-07-28 DIAGNOSIS — E860 Dehydration: Secondary | ICD-10-CM

## 2014-07-28 LAB — BASIC METABOLIC PANEL
ANION GAP: 7 (ref 5–15)
BUN: 13 mg/dL (ref 6–23)
CO2: 29 mmol/L (ref 19–32)
CREATININE: 0.81 mg/dL (ref 0.50–1.10)
Calcium: 9.4 mg/dL (ref 8.4–10.5)
Chloride: 105 mmol/L (ref 96–112)
GFR calc Af Amer: 77 mL/min — ABNORMAL LOW (ref >90)
GFR calc non Af Amer: 67 mL/min — ABNORMAL LOW (ref >90)
GLUCOSE: 113 mg/dL — AB (ref 70–99)
Potassium: 3.6 mmol/L (ref 3.5–5.1)
Sodium: 141 mmol/L (ref 135–145)

## 2014-07-28 LAB — MAGNESIUM: MAGNESIUM: 1.9 mg/dL (ref 1.5–2.5)

## 2014-07-28 MED ORDER — SODIUM CHLORIDE 0.9 % IV SOLN
INTRAVENOUS | Status: DC
  Administered 2014-07-28 – 2014-07-29 (×5): via INTRAVENOUS

## 2014-07-28 NOTE — Progress Notes (Addendum)
Patient pointing to chest after using [facility] with RN assistance. Patients resp 22 with occasional exp wheezing. RN used pacific interpretor: [id number] and asked if patient is SOB.Patient tells interpretor that she is having a hard time breathing after using the bathroom. Patient requesting PRN med for SOB. After 10 minutes of resting in bed, RN asked patient if she is still having trouble breathing. Patient states "only a little bit" Vital signs taken. MD Tat notified.

## 2014-07-28 NOTE — Progress Notes (Signed)
Medicare Important Message given?  YES (If response is "NO", the following Medicare IM given date fields will be blank) Date Medicare IM given:  05/28/15 Medicare IM given by:  Letha Cape

## 2014-07-28 NOTE — Progress Notes (Signed)
PROGRESS NOTE  [name] ZOX:[id number] DOB: 05/17/1932 DOA: 07/25/2014 PCP: Oneal Grout, MD   Brief history 81 year old female with a history of stroke, diastolic CHF, sick sinus syndrome, coronary artery disease presented with 1-2 day history of dizziness, generalized weakness to the point of inability to ambulate. Apparently, the patient has had some generalized weakness with difficulty ambulating for a number of days. At home, the patient's son checked her blood pressure and it was noted to be 77/49 with heart rate in the 60s. She was given fluids in ED. The patient denies any fevers, chills, chest pain, shortness breath, vomiting, diarrhea, abdominal pain, dysuria, hematuria. There's been no back pain. There's no changes in medications. The patient did not have any mechanical fall or syncope. There has not been any visual disturbance, dysarthria or focal extremity weakness. Assessment/Plan: Orthostatic hypotension/Dehydration -07/25/2014--orthostatic vital signs positive -Continue IV fluids--increase fluids to 75cc/hr -Echocardiogram--EF 50%, inferior wall HK, unclear if AS -Blood pressure has improved -CXR--neg for edema or infiltrates -07/28/14--still orthostatic by pulse -if no improvement, may consider cardiology eval  Dizziness -Likely due to volume depletion and orthostatic hypotension -clinically improved--Tympanic membranes without erythema -Continue IV fluids -CT brain negative for acute findings 2 -Unable to obtain MRI secondary to pacemaker -Hold Aldactone and lisinopril -Underlying rhythm is sinus; review of telemetry did not reveal any concerning dysrhythmia -Urinalysis negative for pyuria Generalized weakness -serum B12--562 -TSH 0.589 -PT eval-->recommend SNF -UA neg for pyuria Chronic diastolic CHF -Appears clinically compensated -02/16/2013 echocardiogram EF 50-55%, grade 1 diastolic dysfunction Atrial Flutter history -not AC candidate due  to falls -presently in sinus History of stroke -continue ASA/plavix Hyperthyroidism -continue Tapazole  Family Communication: Updated son on phone 07/27/14--total time , >50% spent counseling pt and coordinating care Disposition Plan: SNF when medically stable   Procedures/Studies: Ct Head Wo Contrast  07/26/2014   CLINICAL DATA:  Vertiginous syndrome and headache.  EXAM: CT HEAD WITHOUT CONTRAST  TECHNIQUE: Contiguous axial images were obtained from the base of the skull through the vertex without intravenous contrast.  COMPARISON:  07/25/2014 and 02/15/2013  FINDINGS: Ventricles, cisterns and other CSF spaces are within normal. There is moderate chronic ischemic microvascular disease unchanged. Possible small old left frontal infarct unchanged. No evidence of focal mass, mass effect, shift of midline structures or acute hemorrhage. No acute infarction. Remaining bones and soft tissues are unremarkable.  IMPRESSION: No acute intracranial findings.  Moderate chronic ischemic microvascular disease. Possible small old left frontal infarct unchanged.   Electronically Signed   By: Elberta Fortis M.D.   On: 07/26/2014 07:41   Ct Head Wo Contrast  07/25/2014   CLINICAL DATA:  Weakness and altered mental status.  EXAM: CT HEAD WITHOUT CONTRAST  TECHNIQUE: Contiguous axial images were obtained from the base of the skull through the vertex without intravenous contrast.  COMPARISON:  02/15/2013  FINDINGS: Generalized atrophy and chronic small vessel ischemic change, stable from prior exam. Remote left cerebellar, left frontal lobe, left caudate, and right basal gangliar infarcts, unchanged. No intracranial hemorrhage, mass effect, or midline shift. No hydrocephalus. No evidence of territorial infarct. No intracranial fluid collection. Calvarium is intact. Chronic opacification of a single ethmoid air cell. Included paranasal sinuses and mastoid air cells are well aerated.  IMPRESSION: 1.  No acute  intracranial abnormality. 2. Stable atrophy and chronic small vessel ischemic change.   Electronically Signed   By: Rubye Oaks M.D.   On: 07/25/2014 04:59  Dg Chest Port 1 View  07/28/2014   CLINICAL DATA:  81 year old female with hypertension and history of congestive heart failure.  EXAM: PORTABLE CHEST - 1 VIEW  COMPARISON:  Prior chest x-ray 07/26/2014  FINDINGS: Stable cardiomegaly. Atherosclerotic and tortuous thoracic aorta. Left subclavian approach cardiac rhythm maintenance device. Leads project over the right atrium and right ventricle. No evidence of pulmonary edema, focal airspace consolidation, pleural effusion or pneumothorax. No acute osseous abnormality.  IMPRESSION: 1. No acute cardiopulmonary process. 2. Stable cardiomegaly.   Electronically Signed   By: Malachy Moan M.D.   On: 07/28/2014 09:19   Dg Chest Port 1 View  07/26/2014   CLINICAL DATA:  Initial evaluation for congestive heart failure  EXAM: PORTABLE CHEST - 1 VIEW  COMPARISON:  02/13/2013  FINDINGS: Limited inspiratory effect. Elevated right diaphragm stable. Mild to moderate cardiac enlargement stable. Uncoiling and calcification of the aorta stable. 2 lead cardiac pacer stable. Vascular pattern normal. Lungs clear.  IMPRESSION: No acute findings   Electronically Signed   By: Esperanza Heir M.D.   On: 07/26/2014 17:15         Subjective: Patient denies fevers, chills, headache, chest pain, dyspnea, nausea, vomiting, diarrhea, abdominal pain,    Objective: Filed Vitals:   07/28/14 1121 07/28/14 1125 07/28/14 1127 07/28/14 1316  BP: 138/84 135/95 143/91 125/96  Pulse: 68 87 94 74  Temp:    98.1 F (36.7 C)  TempSrc:    Oral  Resp:    19  Height:      Weight:      SpO2: 96% 96% 95% 95%    Intake/Output Summary (Last 24 hours) at 07/28/14 2014 Last data filed at 07/28/14 1851  Gross per 24 hour  Intake   1375 ml  Output    850 ml  Net    525 ml   Weight change: 0.5 kg (1 lb 1.6  oz) Exam:   General:  Pt is alert, follows commands appropriately, not in acute distress  HEENT: No icterus, No thrush, No neck mass, [place]/AT  Cardiovascular: RRR, S1/S2, no rubs, no gallops  Respiratory: CTA bilaterally, no wheezing, no crackles, no rhonchi  Abdomen: Soft/+BS, non tender, non distended, no guarding  Extremities: No edema, No lymphangitis, No petechiae, No rashes, no synovitis  Data Reviewed: Basic Metabolic Panel:  Recent Labs Lab 07/25/14 0335 07/26/14 0601 07/27/14 0632 07/28/14 0838  NA 140 140 140 141  K 3.9 4.0 3.9 3.6  CL 104 108 105 105  CO2 28 25 31 29   GLUCOSE 108* 111* 111* 113*  BUN 19 22 17 13   CREATININE 0.86 0.78 0.86 0.81  CALCIUM 9.1 8.9 9.5 9.4  MG  --   --  1.9 1.9   Liver Function Tests:  Recent Labs Lab 07/25/14 0335  AST 25  ALT 15  ALKPHOS 44  BILITOT 0.8  PROT 6.7  ALBUMIN 3.6   No results for input(s): LIPASE, AMYLASE in the last 168 hours. No results for input(s): AMMONIA in the last 168 hours. CBC:  Recent Labs Lab 07/25/14 0335 07/26/14 0601  WBC 8.4 7.7  HGB 14.0 12.8  HCT 42.2 39.0  MCV 90.2 91.1  PLT 213 207   Cardiac Enzymes:  Recent Labs Lab 07/26/14 1310 07/26/14 1710 07/26/14 2353  TROPONINI <0.03 <0.03 0.03   BNP: Invalid input(s): POCBNP CBG:  Recent Labs Lab 07/25/14 0455  GLUCAP 98    No results found for this or any previous visit (from the past  240 hour(s)).   Scheduled Meds: . aspirin  81 mg Oral Daily  . calcium-vitamin D  1 tablet Oral BID  . clopidogrel  75 mg Oral Daily  . heparin  5,000 Units Subcutaneous 3 times per day  . methimazole  5 mg Oral Daily  . polyethylene glycol  17 g Oral BID  . simvastatin  40 mg Oral q1800  . sodium chloride  3 mL Intravenous Q12H  . vitamin C  500 mg Oral Daily   Continuous Infusions: . sodium chloride 75 mL/hr at 07/28/14 1754     Melvern Ramone, DO  [facility] Pager [phone]  If 7PM-7AM, please contact  night-coverage [email or web address] Password [email or web address] 07/28/2014, 8:14 PM   LOS: 3 days

## 2014-07-29 DIAGNOSIS — I5032 Chronic diastolic (congestive) heart failure: Secondary | ICD-10-CM

## 2014-07-29 DIAGNOSIS — I951 Orthostatic hypotension: Principal | ICD-10-CM

## 2014-07-29 DIAGNOSIS — I495 Sick sinus syndrome: Secondary | ICD-10-CM

## 2014-07-29 DIAGNOSIS — R42 Dizziness and giddiness: Secondary | ICD-10-CM

## 2014-07-29 NOTE — Progress Notes (Signed)
PT Cancellation Note  Patient Details Name: [name] MRN: [id number] DOB: [birth year]   Cancelled Treatment:    Reason Eval/Treat Not Completed: Other (comment).  Pt just now eating lunch and requested to continue eating.  Will f/u another time.     Kunaal Walkins, Alison Murray 07/29/2014, 2:29 PM

## 2014-07-29 NOTE — Progress Notes (Signed)
PATIENT DETAILS Name: [name] Age: 81 y.o. Sex: female Date of Birth: Aug 15, 1931 Admit Date: 07/25/2014 Admitting Physician Leroy Sea, MD [id number], [facility], MD  Subjective: Feels better. No major complaints.  Assessment/Plan: Principal Problem:   Dizziness: Likely secondary to orthostatic hypotension. Significantly improved with IV fluids. CT head on 2/6 and on 2/7 negative.  Active Problems:    Orthostatic hypotension: Secondary to dehydration, and diuretic regimen. She was hydrated with IV fluids, antihypertensives including lisinopril, Aldactone were held. Subsequently orthostatic vitals have now resolved. Place TED hose, and minimize fluids. SNF on discharge.     Chronic diastolic heart failure: Clinically compensated.     History of CVA: CT head 2 negative, unable to pursue MRI given pacemaker in place. Continue with dual antiplatelet regimen-aspirin/Plavix and statin       Hyperthyroidism : Continue with Tapazole. CBC and LFTs stable on admission.      Sinoatrial node dysfunction: Pacemaker in place.   Disposition: Remain inpatient-SNF in am  Antibiotics:  Anti-infectives    None      DVT Prophylaxis: Prophylactic  Heparin   Code Status: Full code   Family Communication None at bedside  Procedures:  None  CONSULTS:  None  MEDICATIONS: Scheduled Meds: . aspirin  81 mg Oral Daily  . calcium-vitamin D  1 tablet Oral BID  . clopidogrel  75 mg Oral Daily  . heparin  5,000 Units Subcutaneous 3 times per day  . methimazole  5 mg Oral Daily  . polyethylene glycol  17 g Oral BID  . simvastatin  40 mg Oral q1800  . sodium chloride  3 mL Intravenous Q12H  . vitamin C  500 mg Oral Daily   Continuous Infusions: . sodium chloride 75 mL/hr at 07/29/14 0717   PRN Meds:.acetaminophen, guaiFENesin-dextromethorphan, HYDROcodone-acetaminophen, hydroxypropyl methylcellulose / hypromellose, nitroGLYCERIN, ondansetron **OR** ondansetron  (ZOFRAN) IV    PHYSICAL EXAM: Vital signs in last 24 hours: Filed Vitals:   07/28/14 1127 07/28/14 1316 07/28/14 2220 07/29/14 0530  BP: 143/91 125/96 157/83 144/77  Pulse: 94 74 74 71  Temp:  98.1 F (36.7 C) 99.3 F (37.4 C) 99.1 F (37.3 C)  TempSrc:  Oral Oral Oral  Resp:  19 20 20   Height:      Weight:   82.8 kg (182 lb 8.7 oz)   SpO2: 95% 95% 96% 95%    Weight change: 0.3 kg (10.6 oz) Filed Weights   07/27/14 0550 07/28/14 0427 07/28/14 2220  Weight: 82 kg (180 lb 12.4 oz) 82.5 kg (181 lb 14.1 oz) 82.8 kg (182 lb 8.7 oz)   Body mass index is 29.48 kg/(m^2).   Gen Exam: Awake and alert with clear speech.  Neck: Supple, No JVD.   Chest: B/L Clear.   CVS: S1 S2 Regular, no murmurs.  Abdomen: soft, BS +, non tender, non distended.  Extremities: no edema, lower extremities warm to touch. Neurologic: Non Focal.   Skin: No Rash.   Wounds: N/A.   Intake/Output from previous day:  Intake/Output Summary (Last 24 hours) at 07/29/14 1407 Last data filed at 07/29/14 1236  Gross per 24 hour  Intake   1720 ml  Output    400 ml  Net   1320 ml     LAB RESULTS: CBC  Recent Labs Lab 07/25/14 0335 07/26/14 0601  WBC 8.4 7.7  HGB 14.0 12.8  HCT 42.2 39.0  PLT 213 207  MCV 90.2 91.1  MCH  29.9 29.9  MCHC 33.2 32.8  RDW 14.6 14.8    Chemistries   Recent Labs Lab 07/25/14 0335 07/26/14 0601 07/27/14 0632 07/28/14 0838  NA 140 140 140 141  K 3.9 4.0 3.9 3.6  CL 104 108 105 105  CO2 GLUCOSE 108* 111* 111* 113*  BUN CREATININE 0.86 0.78 0.86 0.81  CALCIUM 9.1 8.9 9.5 9.4  MG  --   --  1.9 1.9    CBG:  Recent Labs Lab 07/25/14 0455  GLUCAP 98    GFR Estimated Creatinine Clearance: 60.1 mL/min (by C-G formula based on Cr of 0.81).  Coagulation profile No results for input(s): INR, PROTIME in the last 168 hours.  Cardiac Enzymes  Recent Labs Lab 07/26/14 1310 07/26/14 1710 07/26/14 2353  TROPONINI <0.03 <0.03  0.03    Invalid input(s): POCBNP No results for input(s): DDIMER in the last 72 hours. No results for input(s): HGBA1C in the last 72 hours. No results for input(s): CHOL, HDL, LDLCALC, TRIG, CHOLHDL, LDLDIRECT in the last 72 hours. No results for input(s): TSH, T4TOTAL, T3FREE, THYROIDAB in the last 72 hours.  Invalid input(s): FREET3 No results for input(s): VITAMINB12, FOLATE, FERRITIN, TIBC, IRON, RETICCTPCT in the last 72 hours. No results for input(s): LIPASE, AMYLASE in the last 72 hours.  Urine Studies No results for input(s): UHGB, CRYS in the last 72 hours.  Invalid input(s): UACOL, UAPR, USPG, UPH, UTP, UGL, UKET, UBIL, UNIT, UROB, ULEU, UEPI, UWBC, URBC, UBAC, CAST, UCOM, BILUA  MICROBIOLOGY: No results found for this or any previous visit (from the past 240 hour(s)).  RADIOLOGY STUDIES/RESULTS: Ct Head Wo Contrast  07/26/2014   CLINICAL DATA:  Vertiginous syndrome and headache.  EXAM: CT HEAD WITHOUT CONTRAST  TECHNIQUE: Contiguous axial images were obtained from the base of the skull through the vertex without intravenous contrast.  COMPARISON:  07/25/2014 and 02/15/2013  FINDINGS: Ventricles, cisterns and other CSF spaces are within normal. There is moderate chronic ischemic microvascular disease unchanged. Possible small old left frontal infarct unchanged. No evidence of focal mass, mass effect, shift of midline structures or acute hemorrhage. No acute infarction. Remaining bones and soft tissues are unremarkable.  IMPRESSION: No acute intracranial findings.  Moderate chronic ischemic microvascular disease. Possible small old left frontal infarct unchanged.   Electronically Signed   By: Elberta Fortis M.D.   On: 07/26/2014 07:41   Ct Head Wo Contrast  07/25/2014   CLINICAL DATA:  Weakness and altered mental status.  EXAM: CT HEAD WITHOUT CONTRAST  TECHNIQUE: Contiguous axial images were obtained from the base of the skull through the vertex without intravenous contrast.   COMPARISON:  02/15/2013  FINDINGS: Generalized atrophy and chronic small vessel ischemic change, stable from prior exam. Remote left cerebellar, left frontal lobe, left caudate, and right basal gangliar infarcts, unchanged. No intracranial hemorrhage, mass effect, or midline shift. No hydrocephalus. No evidence of territorial infarct. No intracranial fluid collection. Calvarium is intact. Chronic opacification of a single ethmoid air cell. Included paranasal sinuses and mastoid air cells are well aerated.  IMPRESSION: 1.  No acute intracranial abnormality. 2. Stable atrophy and chronic small vessel ischemic change.   Electronically Signed   By: Rubye Oaks M.D.   On: 07/25/2014 04:59   Dg Chest Port 1 View  07/28/2014   CLINICAL DATA:  81 year old female with hypertension and history of congestive heart failure.  EXAM: PORTABLE CHEST - 1 VIEW  COMPARISON:  Prior chest x-ray 07/26/2014  FINDINGS: Stable cardiomegaly. Atherosclerotic and tortuous thoracic aorta. Left subclavian approach cardiac rhythm maintenance device. Leads project over the right atrium and right ventricle. No evidence of pulmonary edema, focal airspace consolidation, pleural effusion or pneumothorax. No acute osseous abnormality.  IMPRESSION: 1. No acute cardiopulmonary process. 2. Stable cardiomegaly.   Electronically Signed   By: Malachy Moan M.D.   On: 07/28/2014 09:19   Dg Chest Port 1 View  07/26/2014   CLINICAL DATA:  Initial evaluation for congestive heart failure  EXAM: PORTABLE CHEST - 1 VIEW  COMPARISON:  02/13/2013  FINDINGS: Limited inspiratory effect. Elevated right diaphragm stable. Mild to moderate cardiac enlargement stable. Uncoiling and calcification of the aorta stable. 2 lead cardiac pacer stable. Vascular pattern normal. Lungs clear.  IMPRESSION: No acute findings   Electronically Signed   By: Esperanza Heir M.D.   On: 07/26/2014 17:15    Jeoffrey Massed, MD  [facility] Pager:[phone]  If  7PM-7AM, please contact night-coverage [email or web address] Password [email or web address] 07/29/2014, 2:07 PM   LOS: 4 days

## 2014-07-29 NOTE — Clinical Social Work Placement (Signed)
Clinical Social Work Department CLINICAL SOCIAL WORK PLACEMENT NOTE 07/29/2014  Patient:  [name], [name]  Account Number:  [id number] Admit date:  07/25/2014  Clinical Social Worker:  Lavell Luster  Date/time:  07/29/2014 02:17 PM  Clinical Social Work is seeking post-discharge placement for this patient at the following level of care:   SKILLED NURSING   (*CSW will update this form in Epic as items are completed)   07/29/2014  Patient/family provided with [facility] Department of Clinical Social Work's list of facilities offering this level of care within the geographic area requested by the patient (or if unable, by the patient's family).  07/29/2014  Patient/family informed of their freedom to choose among providers that offer the needed level of care, that participate in Medicare, Medicaid or managed care program needed by the patient, have an available bed and are willing to accept the patient.  07/29/2014  Patient/family informed of [facility]' ownership interest in [facility], as well as of the fact that they are under no obligation to receive care at this facility.  PASARR submitted to EDS on  PASARR number received on   FL2 transmitted to all facilities in geographic area requested by pt/family on  07/29/2014 FL2 transmitted to all facilities within larger geographic area on   Patient informed that his/her managed care company has contracts with or will negotiate with  certain facilities, including the following:     Patient/family informed of bed offers received:  07/29/2014 Patient chooses bed at [facility] Physician recommends and patient chooses bed at    Patient to be transferred [facility]   Patient to be transferred to facility by  Patient and family notified of transfer on  Name of family member notified:    The following physician request were entered in Epic: Physician Request  Please  sign FL2.    Additional Comments:   Roddie Mc MSW, [place], [place], [phone]

## 2014-07-29 NOTE — Clinical Social Work Psychosocial (Signed)
Clinical Social Work Department BRIEF PSYCHOSOCIAL ASSESSMENT 07/29/2014  Patient:  [name], [name]     Account Number:  [id number]     Admit date:  07/25/2014  Clinical Social Worker:  Lavell Luster  Date/Time:  07/29/2014 02:10 PM  Referred by:  Physician  Date Referred:  07/29/2014 Referred for  SNF Placement   Other Referral:   NA   Interview type:  Family Other interview type:   Patient's son interviewed to complete assessment.    PSYCHOSOCIAL DATA Living Status:  FAMILY Admitted from facility:   Level of care:   Primary support name:  Ed Primary support relationship to patient:  CHILD, ADULT Degree of support available:   Support is strong.    CURRENT CONCERNS Current Concerns  Post-Acute Placement   Other Concerns:   NA    SOCIAL WORK ASSESSMENT / PLAN CSW spoke with patient's son by phone to complete assessment. Patient's son Ed is patient's main support and would like for the patient to DC to [facility] SNF once medically stable. Ed shows good insight into reason for patient's admission and states that the family has been coping well with patient being hospitalized as they have been through this process before. Ed sounded calm and engaged appropriately with CSW. CSW explained SNF search/placement process and answered questions. CSW will follow up with available be offers.   Assessment/plan status:  Psychosocial Support/Ongoing Assessment of Needs Other assessment/ plan:   Complete FL2, Fax, PASRR   Information/referral to community resources:   CSW contact information and SNF list given.    PATIENT'S/FAMILY'S RESPONSE TO PLAN OF CARE: Patient's family requesting placement at [facility]. Family available to asssit with any paperwork required by facility. CSW will assist with DC.       Roddie Mc MSW, [place], [place], [phone]

## 2014-07-30 DIAGNOSIS — Z950 Presence of cardiac pacemaker: Secondary | ICD-10-CM

## 2014-07-30 MED ORDER — LISINOPRIL 10 MG PO TABS: 5.0000 mg | ORAL_TABLET | Freq: Every day | ORAL | Status: DC

## 2014-07-30 NOTE — Discharge Summary (Signed)
PATIENT DETAILS Name: [name] Age: 81 y.o. Sex: female Date of Birth: [birth year] MRN: [id number]. Admitting Physician: Leroy Sea, MD HQI:ONGEXB, [facility], MD  Admit Date: 07/25/2014 Discharge date: 07/30/2014  Recommendations for Outpatient Follow-up:  1. Please repeat CBC and chemistries in 1 week 2. Diuretics discontinued, resumed lisinopril but at 5 mg on discharge. Please titrate accordingly. Note-patient orthostatic this admission hence Aldactone discontinued. 3. Place Thigh-high TED hose.  PRIMARY DISCHARGE DIAGNOSIS:  Principal Problem:   Dizziness Active Problems:   Thyrotoxicosis   Coronary atherosclerosis   Cerebral artery occlusion with cerebral infarction   PACEMAKER-[employer]   Sinoatrial node dysfunction   Generalized weakness   Chronic diastolic heart failure   Weakness   Orthostatic hypotension   Chronic diastolic CHF (congestive heart failure)   Vertiginous syndrome   Dehydration      PAST MEDICAL HISTORY: Past Medical History  Diagnosis Date  . Lumbar back pain   . Other urinary incontinence   . Memory loss   . DJD (degenerative joint disease)   . Depression   . Hypertension   . CVA (cerebral infarction)   . CHF (congestive heart failure)     Chronic Diastolic  . CAD (coronary artery disease)   . Presence of permanent cardiac pacemaker   . Osteoporosis   . Hypercholesterolemia   . Hyperthyroidism   . Anxiety   . Vertigo   . Tachy-brady syndrome   . Ischemic cardiomyopathy     DISCHARGE MEDICATIONS: Current Discharge Medication List    CONTINUE these medications which have CHANGED   Details  lisinopril (PRINIVIL,ZESTRIL) 10 MG tablet Take 0.5 tablets (5 mg total) by mouth daily. Qty: 30 tablet, Refills: 11      CONTINUE these medications which have NOT CHANGED   Details  alendronate (FOSAMAX) 70 MG tablet Take 1 tablet (70 mg total) by mouth once a week. Take with a full glass of water on an empty stomach. Qty: 12  tablet, Refills: 3    Ascorbic Acid (VITAMIN C) 500 MG tablet Take 500 mg by mouth daily.      aspirin ([employer] ASPIRIN LOW DOSE) 81 MG EC tablet TAKE 1 TABLET (81 MG TOTAL) BY MOUTH DAILY. SWALLOW WHOLE. Qty: 30 tablet, Refills: 11    Calcium Carbonate-Vit D-Min 600-400 MG-UNIT TABS Take 1 tablet by mouth 2 (two) times daily.     clopidogrel (PLAVIX) 75 MG tablet Take 1 tablet (75 mg total) by mouth daily. Qty: 30 tablet, Refills: 11    methimazole (TAPAZOLE) 5 MG tablet Take one tablet by mouth three days a week as prescribed by endocrinology Qty: 12 tablet, Refills: 3    Multiple Vitamins-Minerals (CENTRUM SILVER) tablet Take 1 tablet by mouth daily.      nitroGLYCERIN (NITROSTAT) 0.4 MG SL tablet Place 1 tablet (0.4 mg total) under the tongue every 5 (five) minutes as needed. For chest pain Qty: 30 tablet, Refills: 0    polyethylene glycol powder (GLYCOLAX/MIRALAX) powder MIX 1/2 CAPFUL WITH 8 OUNCES OF WATER AND TAKE AT BEDTIME AS NEEDED FOR CONSTIPATION Qty: 527 g, Refills: 6    Propylene Glycol (SYSTANE BALANCE) 0.6 % SOLN Apply 1 drop to eye 2 (two) times daily. Qty: 1 Bottle, Refills: 3    simvastatin (ZOCOR) 40 MG tablet Take 1 tablet (40 mg total) by mouth daily at 6 PM. Qty: 30 tablet, Refills: 11      STOP taking these medications     spironolactone (ALDACTONE) 25 MG tablet  ALLERGIES:   Allergies  Allergen Reactions  . Iodine Other (See Comments)    unknown  . Iohexol      Code: HIVES, Desc: hives with nonionic contrast 7 yrs ago [place] iv benedryl given   . Peanut-Containing Drug Products     BRIEF HPI:  See H&P, Labs, Consult and Test reports for all details in brief, patient is a 81 y.o. female, with history of stroke in the past, chronic diastolic CHF or 55% on last echo, hyper thyroidism, osteoporosis, sick sinus syndrome status post pacemaker placement questionable history of A. fib not on anticoagulation, chronic low back pain, CAD,  dyslipidemia submitted for evaluation of dizziness and lightheadedness.  CONSULTATIONS:   None  PERTINENT RADIOLOGIC STUDIES: Ct Head Wo Contrast  07/26/2014   CLINICAL DATA:  Vertiginous syndrome and headache.  EXAM: CT HEAD WITHOUT CONTRAST  TECHNIQUE: Contiguous axial images were obtained from the base of the skull through the vertex without intravenous contrast.  COMPARISON:  07/25/2014 and 02/15/2013  FINDINGS: Ventricles, cisterns and other CSF spaces are within normal. There is moderate chronic ischemic microvascular disease unchanged. Possible small old left frontal infarct unchanged. No evidence of focal mass, mass effect, shift of midline structures or acute hemorrhage. No acute infarction. Remaining bones and soft tissues are unremarkable.  IMPRESSION: No acute intracranial findings.  Moderate chronic ischemic microvascular disease. Possible small old left frontal infarct unchanged.   Electronically Signed   By: Elberta Fortis M.D.   On: 07/26/2014 07:41   Ct Head Wo Contrast  07/25/2014   CLINICAL DATA:  Weakness and altered mental status.  EXAM: CT HEAD WITHOUT CONTRAST  TECHNIQUE: Contiguous axial images were obtained from the base of the skull through the vertex without intravenous contrast.  COMPARISON:  02/15/2013  FINDINGS: Generalized atrophy and chronic small vessel ischemic change, stable from prior exam. Remote left cerebellar, left frontal lobe, left caudate, and right basal gangliar infarcts, unchanged. No intracranial hemorrhage, mass effect, or midline shift. No hydrocephalus. No evidence of territorial infarct. No intracranial fluid collection. Calvarium is intact. Chronic opacification of a single ethmoid air cell. Included paranasal sinuses and mastoid air cells are well aerated.  IMPRESSION: 1.  No acute intracranial abnormality. 2. Stable atrophy and chronic small vessel ischemic change.   Electronically Signed   By: Rubye Oaks M.D.   On: 07/25/2014 04:59   Dg Chest Port  1 View  07/28/2014   CLINICAL DATA:  81 year old female with hypertension and history of congestive heart failure.  EXAM: PORTABLE CHEST - 1 VIEW  COMPARISON:  Prior chest x-ray 07/26/2014  FINDINGS: Stable cardiomegaly. Atherosclerotic and tortuous thoracic aorta. Left subclavian approach cardiac rhythm maintenance device. Leads project over the right atrium and right ventricle. No evidence of pulmonary edema, focal airspace consolidation, pleural effusion or pneumothorax. No acute osseous abnormality.  IMPRESSION: 1. No acute cardiopulmonary process. 2. Stable cardiomegaly.   Electronically Signed   By: Malachy Moan M.D.   On: 07/28/2014 09:19   Dg Chest Port 1 View  07/26/2014   CLINICAL DATA:  Initial evaluation for congestive heart failure  EXAM: PORTABLE CHEST - 1 VIEW  COMPARISON:  02/13/2013  FINDINGS: Limited inspiratory effect. Elevated right diaphragm stable. Mild to moderate cardiac enlargement stable. Uncoiling and calcification of the aorta stable. 2 lead cardiac pacer stable. Vascular pattern normal. Lungs clear.  IMPRESSION: No acute findings   Electronically Signed   By: Esperanza Heir M.D.   On: 07/26/2014 17:15     PERTINENT  LAB RESULTS: CBC: No results for input(s): WBC, HGB, HCT, PLT in the last 72 hours. CMET CMP     Component Value Date/Time   NA 141 07/28/2014 0838   NA 142 07/14/2014 1559   K 3.6 07/28/2014 0838   CL 105 07/28/2014 0838   CO2 29 07/28/2014 0838   GLUCOSE 113* 07/28/2014 0838   GLUCOSE 100* 07/14/2014 1559   BUN 13 07/28/2014 0838   BUN 18 07/14/2014 1559   CREATININE 0.81 07/28/2014 0838   CALCIUM 9.4 07/28/2014 0838   PROT 6.7 07/25/2014 0335   PROT 6.8 07/14/2014 1559   ALBUMIN 3.6 07/25/2014 0335   AST 25 07/25/2014 0335   ALT 15 07/25/2014 0335   ALKPHOS 44 07/25/2014 0335   BILITOT 0.8 07/25/2014 0335   GFRNONAA 67* 07/28/2014 0838   GFRAA 77* 07/28/2014 0838    GFR Estimated Creatinine Clearance: 60.3 mL/min (by C-G formula  based on Cr of 0.81). No results for input(s): LIPASE, AMYLASE in the last 72 hours. No results for input(s): CKTOTAL, CKMB, CKMBINDEX, TROPONINI in the last 72 hours. Invalid input(s): POCBNP No results for input(s): DDIMER in the last 72 hours. No results for input(s): HGBA1C in the last 72 hours. No results for input(s): CHOL, HDL, LDLCALC, TRIG, CHOLHDL, LDLDIRECT in the last 72 hours. No results for input(s): TSH, T4TOTAL, T3FREE, THYROIDAB in the last 72 hours.  Invalid input(s): FREET3 No results for input(s): VITAMINB12, FOLATE, FERRITIN, TIBC, IRON, RETICCTPCT in the last 72 hours. Coags: No results for input(s): INR in the last 72 hours.  Invalid input(s): PT Microbiology: No results found for this or any previous visit (from the past 240 hour(s)).   BRIEF HOSPITAL COURSE:  Dizziness: Likely secondary to orthostatic hypotension. Significantly improved with IV fluids. CT head on 2/6 and on 2/7 negative. Seen by physical therapy, recommendations are discharged to SNF. Orthostatic vital signs have resolved by the day of discharge.  Active Problems:  Orthostatic hypotension: Secondary to dehydration, and diuretic regimen. She was hydrated with IV fluids, antihypertensives including lisinopril, Aldactone were held. Subsequently orthostatic vitals have now resolved. Place TED hose, and since blood pressure now creeping up, we will restart lisinopril at 5 mg daily. Please continue to titrate and be cautious of orthostatic vital signs.   Chronic diastolic heart failure: Clinically compensated. Off Aldactone.   History of CVA: CT head 2 negative, unable to pursue MRI given pacemaker in place. Continue with dual antiplatelet regimen-aspirin/Plavix and statin. She has no focal neurological deficits and speech is clear.   Hyperthyroidism : Continue with Tapazole. CBC and LFTs stable on admission. Please monitor LFTs/CBCs periodically while on Tapazole   Sinoatrial node  dysfunction: Pacemaker in place.   TODAY-DAY OF DISCHARGE:  Subjective:   Shantrell Placzek today has no headache,no chest abdominal pain,no new weakness tingling or numbness, feels much better   Objective:   Blood pressure 153/81, pulse 78, temperature 98.9 F (37.2 C), temperature source Oral, resp. rate 18, height  (1.676 m), weight 83.3 kg (183 lb 10.3 oz), SpO2 94 %.  Intake/Output Summary (Last 24 hours) at 07/30/14 1022 Last data filed at 07/30/14 0947  Gross per 24 hour  Intake 1342.5 ml  Output    600 ml  Net  742.5 ml   Filed Weights   07/28/14 0427 07/28/14 2220 07/30/14 0455  Weight: 82.5 kg (181 lb 14.1 oz) 82.8 kg (182 lb 8.7 oz) 83.3 kg (183 lb 10.3 oz)    Exam Awake Alert, Oriented *  3, No new F.N deficits, Normal affect [place].AT,PERRAL Supple Neck,No JVD, No cervical lymphadenopathy appriciated.  Symmetrical Chest wall movement, Good air movement bilaterally, CTAB RRR,No Gallops,Rubs or new Murmurs, No Parasternal Heave +ve B.Sounds, Abd Soft, Non tender, No organomegaly appriciated, No rebound -guarding or rigidity. No Cyanosis, Clubbing or edema, No new Rash or bruise  DISCHARGE CONDITION: Stable  DISPOSITION: SNF  DISCHARGE INSTRUCTIONS:    Activity:  As tolerated with Full fall precautions use walker/cane & assistance as needed  Diet recommendation: Heart Healthy diet  Discharge Instructions    Call MD for:  persistant dizziness or light-headedness    Complete by:  As directed      Diet - low sodium heart healthy    Complete by:  As directed      Increase activity slowly    Complete by:  As directed            Follow-up Information    Follow up with Oneal Grout, MD. Schedule an appointment as soon as possible for a visit in 1 week.   Specialty:  Internal Medicine   Contact information:   [address] [place] [address] [phone]       Total Time spent on discharge equals 45  minutes.  SignedJeoffrey Massed 07/30/2014 10:22 AM

## 2014-07-30 NOTE — Progress Notes (Signed)
Patient discharge teaching given, including activity, diet, follow-up appoints, and medications. Patient verbalized understanding of all discharge instructions. IV access was d/c'd. Vitals are stable. Skin is intact except as charted in most recent assessments. Pt to be escorted out by NT, to be driven home by family. 

## 2014-07-30 NOTE — Clinical Social Work Placement (Signed)
Clinical Social Work Department CLINICAL SOCIAL WORK PLACEMENT NOTE 07/30/2014  Patient:  [name], [name]  Account Number:  [id number] Admit date:  07/25/2014  Clinical Social Worker:  Lavell Luster  Date/time:  07/29/2014 02:17 PM  Clinical Social Work is seeking post-discharge placement for this patient at the following level of care:   SKILLED NURSING   (*CSW will update this form in Epic as items are completed)   07/29/2014  Patient/family provided with [facility] Department of Clinical Social Work's list of facilities offering this level of care within the geographic area requested by the patient (or if unable, by the patient's family).  07/29/2014  Patient/family informed of their freedom to choose among providers that offer the needed level of care, that participate in Medicare, Medicaid or managed care program needed by the patient, have an available bed and are willing to accept the patient.  07/29/2014  Patient/family informed of [facility]' ownership interest in [facility], as well as of the fact that they are under no obligation to receive care at this facility.  PASARR submitted to EDS on  PASARR number received on   FL2 transmitted to all facilities in geographic area requested by pt/family on  07/29/2014 FL2 transmitted to all facilities within larger geographic area on   Patient informed that his/her managed care company has contracts with or will negotiate with  certain facilities, including the following:     Patient/family informed of bed offers received:  07/29/2014 Patient chooses bed at [facility] Physician recommends and patient chooses bed at    Patient to be transferred to [facility]  07/30/2014 Patient to be transferred to facility by Ambulance Patient and family notified of transfer on 07/30/2014 Name of family member notified:  Ed  The following physician request were entered in  Epic: Physician Request  Please sign FL2.    Additional Comments: Per MD patient ready for DC to [facility]. RN, patient, patient's family, and facility aware of DC. Patient to be transported to facility by ambulance. DC Packet on chart along with number for report. BSW intern signing off at this time.  Stacy Gardner, BSW Intern, [phone]

## 2014-08-04 ENCOUNTER — Ambulatory Visit: Payer: Medicare Other | Admitting: Internal Medicine

## 2014-08-11 ENCOUNTER — Encounter: Payer: Self-pay | Admitting: Internal Medicine

## 2014-08-23 ENCOUNTER — Inpatient Hospital Stay (HOSPITAL_COMMUNITY)
Admission: EM | Admit: 2014-08-23 | Discharge: 2014-08-26 | DRG: 069 | Disposition: A | Discharge status: Skilled Nursing Facility | Payer: Medicare Other | Attending: Internal Medicine | Admitting: Internal Medicine

## 2014-08-23 ENCOUNTER — Encounter (HOSPITAL_COMMUNITY): Payer: Self-pay | Admitting: Emergency Medicine

## 2014-08-23 ENCOUNTER — Emergency Department (HOSPITAL_COMMUNITY): Payer: Medicare Other

## 2014-08-23 DIAGNOSIS — I5032 Chronic diastolic (congestive) heart failure: Secondary | ICD-10-CM | POA: Diagnosis present

## 2014-08-23 DIAGNOSIS — F329 Major depressive disorder, single episode, unspecified: Secondary | ICD-10-CM | POA: Diagnosis present

## 2014-08-23 DIAGNOSIS — I480 Paroxysmal atrial fibrillation: Secondary | ICD-10-CM | POA: Diagnosis present

## 2014-08-23 DIAGNOSIS — Z950 Presence of cardiac pacemaker: Secondary | ICD-10-CM

## 2014-08-23 DIAGNOSIS — G459 Transient cerebral ischemic attack, unspecified: Secondary | ICD-10-CM | POA: Diagnosis present

## 2014-08-23 DIAGNOSIS — E669 Obesity, unspecified: Secondary | ICD-10-CM | POA: Diagnosis present

## 2014-08-23 DIAGNOSIS — R059 Cough, unspecified: Secondary | ICD-10-CM

## 2014-08-23 DIAGNOSIS — E119 Type 2 diabetes mellitus without complications: Secondary | ICD-10-CM | POA: Diagnosis present

## 2014-08-23 DIAGNOSIS — Z955 Presence of coronary angioplasty implant and graft: Secondary | ICD-10-CM | POA: Diagnosis not present

## 2014-08-23 DIAGNOSIS — Z8673 Personal history of transient ischemic attack (TIA), and cerebral infarction without residual deficits: Secondary | ICD-10-CM | POA: Diagnosis not present

## 2014-08-23 DIAGNOSIS — E0590 Thyrotoxicosis, unspecified without thyrotoxic crisis or storm: Secondary | ICD-10-CM | POA: Diagnosis present

## 2014-08-23 DIAGNOSIS — E785 Hyperlipidemia, unspecified: Secondary | ICD-10-CM | POA: Diagnosis present

## 2014-08-23 DIAGNOSIS — I6350 Cerebral infarction due to unspecified occlusion or stenosis of unspecified cerebral artery: Secondary | ICD-10-CM

## 2014-08-23 DIAGNOSIS — Z79899 Other long term (current) drug therapy: Secondary | ICD-10-CM | POA: Diagnosis not present

## 2014-08-23 DIAGNOSIS — I2510 Atherosclerotic heart disease of native coronary artery without angina pectoris: Secondary | ICD-10-CM | POA: Diagnosis present

## 2014-08-23 DIAGNOSIS — Z7901 Long term (current) use of anticoagulants: Secondary | ICD-10-CM | POA: Diagnosis not present

## 2014-08-23 DIAGNOSIS — I639 Cerebral infarction, unspecified: Secondary | ICD-10-CM | POA: Diagnosis present

## 2014-08-23 DIAGNOSIS — K219 Gastro-esophageal reflux disease without esophagitis: Secondary | ICD-10-CM | POA: Diagnosis present

## 2014-08-23 DIAGNOSIS — I10 Essential (primary) hypertension: Secondary | ICD-10-CM | POA: Diagnosis present

## 2014-08-23 DIAGNOSIS — Z96653 Presence of artificial knee joint, bilateral: Secondary | ICD-10-CM | POA: Diagnosis present

## 2014-08-23 DIAGNOSIS — M6289 Other specified disorders of muscle: Secondary | ICD-10-CM

## 2014-08-23 DIAGNOSIS — I255 Ischemic cardiomyopathy: Secondary | ICD-10-CM | POA: Diagnosis present

## 2014-08-23 DIAGNOSIS — F419 Anxiety disorder, unspecified: Secondary | ICD-10-CM | POA: Diagnosis present

## 2014-08-23 DIAGNOSIS — R05 Cough: Secondary | ICD-10-CM

## 2014-08-23 DIAGNOSIS — Z7982 Long term (current) use of aspirin: Secondary | ICD-10-CM

## 2014-08-23 DIAGNOSIS — Z8249 Family history of ischemic heart disease and other diseases of the circulatory system: Secondary | ICD-10-CM

## 2014-08-23 DIAGNOSIS — F0390 Unspecified dementia without behavioral disturbance: Secondary | ICD-10-CM | POA: Diagnosis present

## 2014-08-23 DIAGNOSIS — R109 Unspecified abdominal pain: Secondary | ICD-10-CM

## 2014-08-23 DIAGNOSIS — M810 Age-related osteoporosis without current pathological fracture: Secondary | ICD-10-CM | POA: Diagnosis present

## 2014-08-23 DIAGNOSIS — Z6834 Body mass index (BMI) 34.0-34.9, adult: Secondary | ICD-10-CM

## 2014-08-23 DIAGNOSIS — R531 Weakness: Secondary | ICD-10-CM

## 2014-08-23 DIAGNOSIS — M79609 Pain in unspecified limb: Secondary | ICD-10-CM | POA: Diagnosis not present

## 2014-08-23 LAB — CBC WITH DIFFERENTIAL/PLATELET
BASOS PCT: 0 % (ref 0–1)
Basophils Absolute: 0 10*3/uL (ref 0.0–0.1)
EOS ABS: 0.3 10*3/uL (ref 0.0–0.7)
EOS PCT: 3 % (ref 0–5)
HEMATOCRIT: 40.5 % (ref 36.0–46.0)
Hemoglobin: 13.3 g/dL (ref 12.0–15.0)
LYMPHS ABS: 4.3 10*3/uL — AB (ref 0.7–4.0)
Lymphocytes Relative: 44 % (ref 12–46)
MCH: 29.6 pg (ref 26.0–34.0)
MCHC: 32.8 g/dL (ref 30.0–36.0)
MCV: 90 fL (ref 78.0–100.0)
Monocytes Absolute: 0.8 10*3/uL (ref 0.1–1.0)
Monocytes Relative: 8 % (ref 3–12)
Neutro Abs: 4.5 10*3/uL (ref 1.7–7.7)
Neutrophils Relative %: 45 % (ref 43–77)
Platelets: 300 10*3/uL (ref 150–400)
RBC: 4.5 MIL/uL (ref 3.87–5.11)
RDW: 14.3 % (ref 11.5–15.5)
WBC: 9.8 10*3/uL (ref 4.0–10.5)

## 2014-08-23 LAB — PROTIME-INR
INR: 1.2 (ref 0.00–1.49)
Prothrombin Time: 15.4 seconds — ABNORMAL HIGH (ref 11.6–15.2)

## 2014-08-23 LAB — URINE MICROSCOPIC-ADD ON

## 2014-08-23 LAB — URINALYSIS, ROUTINE W REFLEX MICROSCOPIC
Bilirubin Urine: NEGATIVE
GLUCOSE, UA: NEGATIVE mg/dL
Hgb urine dipstick: NEGATIVE
Ketones, ur: NEGATIVE mg/dL
NITRITE: NEGATIVE
PH: 6.5 (ref 5.0–8.0)
PROTEIN: NEGATIVE mg/dL
Specific Gravity, Urine: 1.019 (ref 1.005–1.030)
Urobilinogen, UA: 1 mg/dL (ref 0.0–1.0)

## 2014-08-23 LAB — BASIC METABOLIC PANEL
Anion gap: 3 — ABNORMAL LOW (ref 5–15)
BUN: 18 mg/dL (ref 6–23)
CO2: 35 mmol/L — ABNORMAL HIGH (ref 19–32)
CREATININE: 0.87 mg/dL (ref 0.50–1.10)
Calcium: 8.5 mg/dL (ref 8.4–10.5)
Chloride: 103 mmol/L (ref 96–112)
GFR calc Af Amer: 71 mL/min — ABNORMAL LOW (ref >90)
GFR, EST NON AFRICAN AMERICAN: 61 mL/min — AB (ref >90)
Glucose, Bld: 84 mg/dL (ref 70–99)
Potassium: 4.1 mmol/L (ref 3.5–5.1)
Sodium: 141 mmol/L (ref 135–145)

## 2014-08-23 LAB — I-STAT TROPONIN, ED: TROPONIN I, POC: 0.03 ng/mL (ref 0.00–0.08)

## 2014-08-23 LAB — APTT: aPTT: 26 seconds (ref 24–37)

## 2014-08-23 MED ORDER — LISINOPRIL 10 MG PO TABS: 5.0000 mg | ORAL_TABLET | Freq: Every day | ORAL | Status: DC

## 2014-08-23 MED ORDER — SIMVASTATIN 40 MG PO TABS
40.0000 mg | ORAL_TABLET | Freq: Every day | ORAL | Status: DC
  Administered 2014-08-24 – 2014-08-25 (×2): 40 mg via ORAL
  Filled 2014-08-23 (×2): qty 1

## 2014-08-23 MED ORDER — POLYVINYL ALCOHOL 1.4 % OP SOLN
1.0000 [drp] | Freq: Two times a day (BID) | OPHTHALMIC | Status: DC
  Administered 2014-08-24 – 2014-08-26 (×6): 1 [drp] via OPHTHALMIC
  Filled 2014-08-23: qty 15

## 2014-08-23 MED ORDER — STROKE: EARLY STAGES OF RECOVERY BOOK
Freq: Once | Status: AC
  Administered 2014-08-24: 01:00:00
  Filled 2014-08-23: qty 1

## 2014-08-23 MED ORDER — CLOPIDOGREL BISULFATE 75 MG PO TABS
75.0000 mg | ORAL_TABLET | Freq: Every day | ORAL | Status: DC
  Administered 2014-08-24 – 2014-08-25 (×2): 75 mg via ORAL
  Filled 2014-08-23 (×2): qty 1

## 2014-08-23 MED ORDER — ASPIRIN EC 81 MG PO TBEC
81.0000 mg | DELAYED_RELEASE_TABLET | Freq: Every day | ORAL | Status: DC
  Administered 2014-08-24 – 2014-08-25 (×2): 81 mg via ORAL
  Filled 2014-08-23 (×2): qty 1

## 2014-08-23 MED ORDER — POLYETHYLENE GLYCOL 3350 17 G PO PACK: 17.0000 g | PACK | Freq: Every day | ORAL | Status: DC | PRN

## 2014-08-23 MED ORDER — METHIMAZOLE 5 MG PO TABS
5.0000 mg | ORAL_TABLET | ORAL | Status: DC
  Administered 2014-08-24 – 2014-08-26 (×2): 5 mg via ORAL
  Filled 2014-08-23 (×2): qty 1

## 2014-08-23 MED ORDER — HEPARIN SODIUM (PORCINE) 5000 UNIT/ML IJ SOLN
5000.0000 [IU] | Freq: Three times a day (TID) | INTRAMUSCULAR | Status: AC
  Administered 2014-08-24 – 2014-08-25 (×7): 5000 [IU] via SUBCUTANEOUS
  Filled 2014-08-23 (×6): qty 1

## 2014-08-23 NOTE — Consult Note (Signed)
Consult Reason for Consult: left sided weakness Referring Physician: Dr Blinda Leatherwood  CC: left sided weakness  HPI: [name] is an 81 y.o. female brought to the emergency department for evaluation of left-sided weakness. Patient was with her family on a walk when she developed weakness of the left side. This includes left facial droop. Patient reportedly has had frequent TIAs in the past. Symptoms are similar today, however, they lasted longer than they normally do. Symptoms appear to be improving in the ED.  CT head imaging reviewed and overall unremarkable.   Past Medical History  Diagnosis Date  . Lumbar back pain   . Other urinary incontinence   . Memory loss   . DJD (degenerative joint disease)   . Depression   . Hypertension   . CVA (cerebral infarction)   . CHF (congestive heart failure)     Chronic Diastolic  . CAD (coronary artery disease)   . Presence of permanent cardiac pacemaker   . Osteoporosis   . Hypercholesterolemia   . Hyperthyroidism   . Anxiety   . Vertigo   . Tachy-brady syndrome   . Ischemic cardiomyopathy     Past Surgical History  Procedure Laterality Date  . Pacemaker insertion  2011  . Total knee arthroplasty  2002    bilateral  . Abdominal hysterectomy  1986  . Bilateral stenting  1999    to the RCA  . Cardiac catheterization  05/23/10  . Total abdominal hysterectomy w/ bilateral salpingoophorectomy      Family History  Problem Relation Age of Onset  . Thyroid disease Daughter   . Diabetes Other   . Hypertension Other   . Arthritis Other   . Coronary artery disease Mother 5  . Coronary artery disease Father 71    Social History:  reports that she has never smoked. She does not have any smokeless tobacco history on file. She reports that she does not drink alcohol or use illicit drugs.  Allergies  Allergen Reactions  . Iodine Other (See Comments)    unknown  . Iohexol Hives     Code: HIVES, Desc: hives with nonionic contrast 7  yrs ago ([place] iv benedryl given   . Peanut-Containing Drug Products Other (See Comments)    unknown    Medications: I have reviewed the patient's current medications.  ROS: Out of a complete 14 system review, the patient complains of only the following symptoms, and all other reviewed systems are negative. +facial droop  Physical Examination: Filed Vitals:   08/23/14 2241  BP:   Pulse:   Temp: 98 F (36.7 C)  Resp:    Physical Exam  Constitutional: He appears well-developed and well-nourished.  Psych: Affect appropriate to situation Eyes: No scleral injection HENT: No OP obstrucion Head: Normocephalic.  Cardiovascular: Normal rate and regular rhythm.  Respiratory: Effort normal and breath sounds normal.  GI: Soft. Bowel sounds are normal. No distension. There is no tenderness.  Skin: WDI  Neurologic Examination Mental Status: Alert, oriented, thought content appropriate.  Speech fluent without evidence of aphasia.  Able to follow 3 step commands without difficulty. Cranial Nerves: II: funduscopic exam wnl bilaterally, visual fields grossly normal, pupils equal, round, reactive to light and accommodation III,IV, VI: ptosis not present, extra-ocular motions intact bilaterally V,VII: left facial droop and weakness with activation, facial light touch sensation normal bilaterally VIII: hearing normal bilaterally IX,X: gag reflex present XI: trapezius strength/neck flexion strength normal bilaterally XII: tongue strength normal  Motor: Right : Upper  extremity    Left:     Upper extremity 5/5 deltoid       5/5 deltoid 5/5 biceps      5/5 biceps  5/5 triceps      5/5 triceps 5/5 hand grip      5/5 hand grip  Lower extremity     Lower extremity 5/5 hip flexor      5/5 hip flexor 5/5 quadricep      5/5 quadriceps  5/5 hamstrings     5/5 hamstrings 5/5 plantar flexion       5/5 plantar flexion 5/5 plantar extension     5/5 plantar extension Mild bilateral hand  resting tremor Sensory: Pinprick and light touch intact throughout, bilaterally Deep Tendon Reflexes: 2+ and symmetric throughout Plantars: Right: downgoing   Left: downgoing Cerebellar: Bilateral intention tremor with FTN Gait: deferreed  Laboratory Studies:   Basic Metabolic Panel:  Recent Labs Lab 08/23/14 1835  NA 141  K 4.1  CL 103  CO2 35*  GLUCOSE 84  BUN 18  CREATININE 0.87  CALCIUM 8.5    Liver Function Tests: No results for input(s): AST, ALT, ALKPHOS, BILITOT, PROT, ALBUMIN in the last 168 hours. No results for input(s): LIPASE, AMYLASE in the last 168 hours. No results for input(s): AMMONIA in the last 168 hours.  CBC:  Recent Labs Lab 08/23/14 1835  WBC 9.8  NEUTROABS 4.5  HGB 13.3  HCT 40.5  MCV 90.0  PLT 300    Cardiac Enzymes: No results for input(s): CKTOTAL, CKMB, CKMBINDEX, TROPONINI in the last 168 hours.  BNP: Invalid input(s): POCBNP  CBG: No results for input(s): GLUCAP in the last 168 hours.  Microbiology: Results for orders placed or performed during the hospital encounter of 08/20/10  Urine culture     Status: None   Collection Time: 08/20/10  1:15 PM  Result Value Ref Range Status   Specimen Description URINE, CLEAN CATCH  Final   Special Requests NONE  Final   Culture  Setup Time [id number]  Final   Colony Count NO GROWTH  Final   Culture NO GROWTH  Final   Report Status 08/21/2010 FINAL  Final  Surgical pcr screen     Status: None   Collection Time: 08/21/10  4:18 AM  Result Value Ref Range Status   MRSA, PCR NEGATIVE NEGATIVE Final   Staphylococcus aureus  NEGATIVE Final    NEGATIVE        The Xpert SA Assay (FDA approved for NASAL specimens only), is one component of a comprehensive surveillance program.  It is not intended to diagnose infection nor to guide or monitor treatment.    Coagulation Studies:  Recent Labs  08/23/14 2236  LABPROT 15.4*  INR 1.20    Urinalysis:  Recent Labs Lab  08/23/14 1835  COLORURINE YELLOW  LABSPEC 1.019  PHURINE 6.5  GLUCOSEU NEGATIVE  HGBUR NEGATIVE  BILIRUBINUR NEGATIVE  KETONESUR NEGATIVE  PROTEINUR NEGATIVE  UROBILINOGEN 1.0  NITRITE NEGATIVE  LEUKOCYTESUR TRACE*    Lipid Panel:     Component Value Date/Time   CHOL 165 07/14/2014 1559   CHOL 142 09/21/2011 0532   TRIG 169* 07/14/2014 1559   TRIG 129 10/06/2009   HDL 72 07/14/2014 1559   HDL 61 09/21/2011 0532   CHOLHDL 2.3 07/14/2014 1559   CHOLHDL 2.3 09/21/2011 0532   VLDL 21 09/21/2011 0532   LDLCALC 59 07/14/2014 1559   LDLCALC 60 09/21/2011 0532    HgbA1C:  Lab Results  Component Value Date   HGBA1C 5.9* 07/14/2014    Urine Drug Screen:  No results found for: LABOPIA, COCAINSCRNUR, LABBENZ, AMPHETMU, THCU, LABBARB  Alcohol Level: No results for input(s): ETH in the last 168 hours.  Other results:  Imaging: Ct Head Wo Contrast  08/23/2014   CLINICAL DATA:  Left-sided weakness  EXAM: CT HEAD WITHOUT CONTRAST  TECHNIQUE: Contiguous axial images were obtained from the base of the skull through the vertex without intravenous contrast.  COMPARISON:  CT head 07/26/2014  FINDINGS: Moderate atrophy and moderate chronic microvascular ischemic change throughout the white matter.  Negative for acute infarct. Negative for hemorrhage or mass. No change from the prior CT.  Mucosal edema and small air-fluid level right maxillary sinus.  IMPRESSION: Atrophy and chronic microvascular ischemia. No acute intracranial abnormality.  Air-fluid level right maxillary sinus has developed since the prior CT.   Electronically Signed   By: Marlan Palau M.D.   On: 08/23/2014 19:04     Assessment/Plan:  82y/o hx of prior CVA and TIA, HTN, HLD presenting with left sided weakness. Symptoms noted to be improving in ED but not fully resolved. Will admit for TIA workup and observation. Unable to get MRI. Had 2D echo done on 2/08 which was unremarkable.   Of note, per cardiology notes she has  a history of A fib but is not on anticoagulation  -continue ASA and Plavix. May need to consider anticoagulation -check carotid doppler -stroke team to follow in morning  Elspeth Cho, DO [facility] [phone]  If 7pm- 7am, please page neurology on call as listed in AMION. 08/23/2014, 11:35 PM

## 2014-08-23 NOTE — H&P (Addendum)
[facility] History and Physical  RISSIE SCULLEY ZOX:[id number] DOB: Aug 27, 1931 DOA: 08/23/2014  Referring physician: EDP PCP: [name], NP   Chief Complaint: Left facial droop   HPI: SEMAYA VIDA is a 81 y.o. female brought to the ED for evaluation for left sided facial droop.  Patient was with her family on a walk when she developed weakness of her left side including left facial droop.  Per their report patient has frequent TIAs in the past, always has left sided weakness with these.  This time however symptoms of facial droop have persisted.  Patient was brought to the ED where she continues to have L facial droop, some slurring of speech due to facial droop but no difficulty with word finding.  Review of Systems: Systems reviewed.  As above, otherwise negative  Past Medical History  Diagnosis Date  . Lumbar back pain   . Other urinary incontinence   . Memory loss   . DJD (degenerative joint disease)   . Depression   . Hypertension   . CVA (cerebral infarction)   . CHF (congestive heart failure)     Chronic Diastolic  . CAD (coronary artery disease)   . Presence of permanent cardiac pacemaker   . Osteoporosis   . Hypercholesterolemia   . Hyperthyroidism   . Anxiety   . Vertigo   . Tachy-brady syndrome   . Ischemic cardiomyopathy    Past Surgical History  Procedure Laterality Date  . Pacemaker insertion  2011  . Total knee arthroplasty  2002    bilateral  . Abdominal hysterectomy  1986  . Bilateral stenting  1999    to the RCA  . Cardiac catheterization  05/23/10  . Total abdominal hysterectomy w/ bilateral salpingoophorectomy     Social History:  reports that she has never smoked. She does not have any smokeless tobacco history on file. She reports that she does not drink alcohol or use illicit drugs.  Allergies  Allergen Reactions  . Iodine Other (See Comments)    unknown  . Iohexol Hives     Code: HIVES, Desc: hives with nonionic  contrast 7 yrs ago ([place] iv benedryl given   . Peanut-Containing Drug Products Other (See Comments)    unknown    Family History  Problem Relation Age of Onset  . Thyroid disease Daughter   . Diabetes Other   . Hypertension Other   . Arthritis Other   . Coronary artery disease Mother 67  . Coronary artery disease Father 30     Prior to Admission medications   Medication Sig Start Date End Date Taking? Authorizing Provider  alendronate (FOSAMAX) 70 MG tablet Take 1 tablet (70 mg total) by mouth once a week. Take with a full glass of water on an empty stomach. 07/14/14  Yes Mahima Glade Lloyd, MD  Ascorbic Acid (VITAMIN C) 500 MG tablet Take 500 mg by mouth daily.     Yes Historical Provider, MD  aspirin ([employer] ASPIRIN LOW DOSE) 81 MG EC tablet TAKE 1 TABLET (81 MG TOTAL) BY MOUTH DAILY. SWALLOW WHOLE. 02/19/14  Yes Pricilla Riffle, MD  Calcium Carbonate-Vit D-Min 600-400 MG-UNIT TABS Take 1 tablet by mouth 2 (two) times daily.    Yes Historical Provider, MD  clopidogrel (PLAVIX) 75 MG tablet Take 1 tablet (75 mg total) by mouth daily. 02/13/14  Yes Pricilla Riffle, MD  lisinopril (PRINIVIL,ZESTRIL) 10 MG tablet Take 0.5 tablets (5 mg total) by mouth daily. 07/30/14  Yes Shanker  Levora Dredge, MD  methimazole (TAPAZOLE) 5 MG tablet Take one tablet by mouth three days a week as prescribed by endocrinology 06/10/14  Yes Kimber Relic, MD  Multiple Vitamins-Minerals (CENTRUM SILVER) tablet Take 1 tablet by mouth daily.     Yes Historical Provider, MD  polyethylene glycol (MIRALAX / GLYCOLAX) packet Take 17 g by mouth daily as needed for moderate constipation.   Yes Historical Provider, MD  Propylene Glycol (SYSTANE BALANCE) 0.6 % SOLN Apply 1 drop to eye 2 (two) times daily. 01/07/14  Yes Mahima Glade Lloyd, MD  simvastatin (ZOCOR) 40 MG tablet Take 1 tablet (40 mg total) by mouth daily at 6 PM. 02/13/14  Yes Pricilla Riffle, MD  nitroGLYCERIN (NITROSTAT) 0.4 MG SL tablet Place 1 tablet (0.4 mg total) under the tongue  every 5 (five) minutes as needed. For chest pain 03/31/14   Oneal Grout, MD  polyethylene glycol powder (GLYCOLAX/MIRALAX) powder MIX 1/2 CAPFUL WITH 8 OUNCES OF WATER AND TAKE AT BEDTIME AS NEEDED FOR CONSTIPATION 03/27/14   Kermit Balo, DO   Physical Exam: Filed Vitals:   08/23/14 2241  BP:   Pulse:   Temp: 98 F (36.7 C)  Resp:     BP 156/87 mmHg  Pulse 64  Temp(Src) 98 F (36.7 C)  Resp 19  SpO2 96%  General Appearance:    Alert, oriented, no distress, appears stated age  Head:    Normocephalic, atraumatic  Eyes:    PERRL, EOMI, sclera non-icteric        Nose:   Nares without drainage or epistaxis. Mucosa, turbinates normal  Throat:   Moist mucous membranes. Oropharynx without erythema or exudate.  Neck:   Supple. No carotid bruits.  No thyromegaly.  No lymphadenopathy.   Back:     No CVA tenderness, no spinal tenderness  Lungs:     Clear to auscultation bilaterally, without wheezes, rhonchi or rales  Chest wall:    No tenderness to palpitation  Heart:    Regular rate and rhythm without murmurs, gallops, rubs  Abdomen:     Soft, non-tender, nondistended, normal bowel sounds, no organomegaly  Genitalia:    deferred  Rectal:    deferred  Extremities:   No clubbing, cyanosis or edema.  Pulses:   2+ and symmetric all extremities  Skin:   Skin color, texture, turgor normal, no rashes or lesions  Lymph nodes:   Cervical, supraclavicular, and axillary nodes normal  Neurologic:   Left facial droop is obvious on exam.  Not really able to appreciate a LUE / LLE strength difference when compared to the right side.    Labs on Admission:  Basic Metabolic Panel:  Recent Labs Lab 08/23/14 1835  NA 141  K 4.1  CL 103  CO2 35*  GLUCOSE 84  BUN 18  CREATININE 0.87  CALCIUM 8.5   Liver Function Tests: No results for input(s): AST, ALT, ALKPHOS, BILITOT, PROT, ALBUMIN in the last 168 hours. No results for input(s): LIPASE, AMYLASE in the last 168 hours. No results for  input(s): AMMONIA in the last 168 hours. CBC:  Recent Labs Lab 08/23/14 1835  WBC 9.8  NEUTROABS 4.5  HGB 13.3  HCT 40.5  MCV 90.0  PLT 300   Cardiac Enzymes: No results for input(s): CKTOTAL, CKMB, CKMBINDEX, TROPONINI in the last 168 hours.  BNP (last 3 results) No results for input(s): PROBNP in the last 8760 hours. CBG: No results for input(s): GLUCAP in the last 168 hours.  Radiological Exams on Admission: Ct Head Wo Contrast  08/23/2014   CLINICAL DATA:  Left-sided weakness  EXAM: CT HEAD WITHOUT CONTRAST  TECHNIQUE: Contiguous axial images were obtained from the base of the skull through the vertex without intravenous contrast.  COMPARISON:  CT head 07/26/2014  FINDINGS: Moderate atrophy and moderate chronic microvascular ischemic change throughout the white matter.  Negative for acute infarct. Negative for hemorrhage or mass. No change from the prior CT.  Mucosal edema and small air-fluid level right maxillary sinus.  IMPRESSION: Atrophy and chronic microvascular ischemia. No acute intracranial abnormality.  Air-fluid level right maxillary sinus has developed since the prior CT.   Electronically Signed   By: Marlan Palau M.D.   On: 08/23/2014 19:04    EKG: Independently reviewed.  Assessment/Plan Principal Problem:   Acute ischemic stroke Active Problems:   PACEMAKER-[employer]   Left-sided weakness   1. Left-sided weakness - now just left facial droop on exam which persists.  Likely a small acute ischemic stroke. 1. Stroke pathway 2. No MRI due to presence of pacemaker 3. No 2d echo due to this having been done just earlier this month 4. On ASA and plavix for now 5. Carotid duplex ordered 6. Tele monitor 7. Frequent neuro checks 8. PT/OT/SLP evaluations 2. HTN - hold lisinopril and allow permissive HTN  Neurology consulted and will see patient.  Code Status: Full  Family Communication: No family in room Disposition Plan: Admit to inpatient   Time spent:  70 min  GARDNER, JARED M. [facility] Pager [phone]  If 7AM-7PM, please contact the day team taking care of the patient [email or web address] Password [email or web address] 08/23/2014, 11:40 PM

## 2014-08-23 NOTE — ED Provider Notes (Signed)
CSN: [id number]     Arrival date & time 08/23/14  1740 History   First MD Initiated Contact with Patient 08/23/14 1743     Chief Complaint  Patient presents with  . Weakness     (Consider location/radiation/quality/duration/timing/severity/associated sxs/prior Treatment) HPI Comments: Patient brought to the emergency department for evaluation of left-sided weakness. Patient was with her family on a walk when she developed weakness of the left side. This includes left facial droop. Patient reportedly has had frequent TIAs in the past. Symptoms are similar today, however, they lasted longer than they normally do. Timing of onset of symptoms was not clearly identifiable upon arrival. When her son arrived, further information was made available. Patient had been asleep for at least an hour before he came to the room and noticed her symptoms. There was some delay before she came to the ER as well. Could not definitively pinpoint onset of symptoms. Nonetheless, symptoms are mild and improving upon arrival.   Patient is a 81 y.o. female presenting with weakness.  Weakness    Past Medical History  Diagnosis Date  . Lumbar back pain   . Other urinary incontinence   . Memory loss   . DJD (degenerative joint disease)   . Depression   . Hypertension   . CVA (cerebral infarction)   . CHF (congestive heart failure)     Chronic Diastolic  . CAD (coronary artery disease)   . Presence of permanent cardiac pacemaker   . Osteoporosis   . Hypercholesterolemia   . Hyperthyroidism   . Anxiety   . Vertigo   . Tachy-brady syndrome   . Ischemic cardiomyopathy    Past Surgical History  Procedure Laterality Date  . Pacemaker insertion  2011  . Total knee arthroplasty  2002    bilateral  . Abdominal hysterectomy  1986  . Bilateral stenting  1999    to the RCA  . Cardiac catheterization  05/23/10  . Total abdominal hysterectomy w/ bilateral salpingoophorectomy     Family History  Problem Relation  Age of Onset  . Thyroid disease Daughter   . Diabetes Other   . Hypertension Other   . Arthritis Other   . Coronary artery disease Mother 42  . Coronary artery disease Father 34   History  Substance Use Topics  . Smoking status: Never Smoker   . Smokeless tobacco: Not on file  . Alcohol Use: No   OB History    No data available     Review of Systems  Neurological: Positive for weakness (left side).  All other systems reviewed and are negative.     Allergies  Iodine; Iohexol; and Peanut-containing drug products  Home Medications   Prior to Admission medications   Medication Sig Start Date End Date Taking? Authorizing Provider  alendronate (FOSAMAX) 70 MG tablet Take 1 tablet (70 mg total) by mouth once a week. Take with a full glass of water on an empty stomach. 07/14/14  Yes Mahima Glade Lloyd, MD  Ascorbic Acid (VITAMIN C) 500 MG tablet Take 500 mg by mouth daily.     Yes Historical Provider, MD  aspirin ([employer] ASPIRIN LOW DOSE) 81 MG EC tablet TAKE 1 TABLET (81 MG TOTAL) BY MOUTH DAILY. SWALLOW WHOLE. 02/19/14  Yes Pricilla Riffle, MD  Calcium Carbonate-Vit D-Min 600-400 MG-UNIT TABS Take 1 tablet by mouth 2 (two) times daily.    Yes Historical Provider, MD  clopidogrel (PLAVIX) 75 MG tablet Take 1 tablet (75 mg total) by mouth  daily. 02/13/14  Yes Pricilla Riffle, MD  lisinopril (PRINIVIL,ZESTRIL) 10 MG tablet Take 0.5 tablets (5 mg total) by mouth daily. 07/30/14  Yes Shanker Levora Dredge, MD  methimazole (TAPAZOLE) 5 MG tablet Take one tablet by mouth three days a week as prescribed by endocrinology 06/10/14  Yes Kimber Relic, MD  Multiple Vitamins-Minerals (CENTRUM SILVER) tablet Take 1 tablet by mouth daily.     Yes Historical Provider, MD  polyethylene glycol (MIRALAX / GLYCOLAX) packet Take 17 g by mouth daily as needed for moderate constipation.   Yes Historical Provider, MD  Propylene Glycol (SYSTANE BALANCE) 0.6 % SOLN Apply 1 drop to eye 2 (two) times daily. 01/07/14  Yes Mahima  Glade Lloyd, MD  simvastatin (ZOCOR) 40 MG tablet Take 1 tablet (40 mg total) by mouth daily at 6 PM. 02/13/14  Yes Pricilla Riffle, MD  nitroGLYCERIN (NITROSTAT) 0.4 MG SL tablet Place 1 tablet (0.4 mg total) under the tongue every 5 (five) minutes as needed. For chest pain 03/31/14   Oneal Grout, MD  polyethylene glycol powder (GLYCOLAX/MIRALAX) powder MIX 1/2 CAPFUL WITH 8 OUNCES OF WATER AND TAKE AT BEDTIME AS NEEDED FOR CONSTIPATION 03/27/14   Tiffany L Reed, DO   BP 169/87 mmHg  Pulse 63  Temp(Src) 98.5 F (36.9 C)  Resp 16  SpO2 96% Physical Exam  Constitutional: She is oriented to person, place, and time. She appears well-developed and well-nourished. No distress.  HENT:  Head: Normocephalic and atraumatic.  Right Ear: Hearing normal.  Left Ear: Hearing normal.  Nose: Nose normal.  Mouth/Throat: Oropharynx is clear and moist and mucous membranes are normal.  Eyes: Conjunctivae and EOM are normal. Pupils are equal, round, and reactive to light.  Neck: Normal range of motion. Neck supple.  Cardiovascular: Regular rhythm, S1 normal and S2 normal.  Exam reveals no gallop and no friction rub.   No murmur heard. Pulmonary/Chest: Effort normal and breath sounds normal. No respiratory distress. She exhibits no tenderness.  Abdominal: Soft. Normal appearance and bowel sounds are normal. There is no hepatosplenomegaly. There is no tenderness. There is no rebound, no guarding, no tenderness at McBurney's point and negative Murphy's sign. No hernia.  Musculoskeletal: Normal range of motion.  Neurological: She is alert and oriented to person, place, and time. She has normal strength. No cranial nerve deficit or sensory deficit. Coordination normal. GCS eye subscore is 4. GCS verbal subscore is 5. GCS motor subscore is 6.  Skin: Skin is warm, dry and intact. No rash noted. No cyanosis.  Psychiatric: She has a normal mood and affect. Her speech is normal and behavior is normal. Thought content normal.   Nursing note and vitals reviewed.   ED Course  Procedures (including critical care time) Labs Review Labs Reviewed  CBC WITH DIFFERENTIAL/PLATELET  BASIC METABOLIC PANEL  URINALYSIS, ROUTINE W REFLEX MICROSCOPIC    Imaging Review Ct Head Wo Contrast  08/23/2014   CLINICAL DATA:  Left-sided weakness  EXAM: CT HEAD WITHOUT CONTRAST  TECHNIQUE: Contiguous axial images were obtained from the base of the skull through the vertex without intravenous contrast.  COMPARISON:  CT head 07/26/2014  FINDINGS: Moderate atrophy and moderate chronic microvascular ischemic change throughout the white matter.  Negative for acute infarct. Negative for hemorrhage or mass. No change from the prior CT.  Mucosal edema and small air-fluid level right maxillary sinus.  IMPRESSION: Atrophy and chronic microvascular ischemia. No acute intracranial abnormality.  Air-fluid level right maxillary sinus has developed since the  prior CT.   Electronically Signed   By: Marlan Palau M.D.   On: 08/23/2014 19:04     EKG Interpretation   Date/Time:  Sunday August 23 2014 17:49:33 EST Ventricular Rate:  71 PR Interval:  225 QRS Duration: 106 QT Interval:  451 QTC Calculation: 490 R Axis:   -55 Text Interpretation:  Age not entered, assumed to be  81 years old for  purpose of ECG interpretation Sinus or ectopic atrial rhythm Prolonged PR  interval Left anterior fascicular block Nonspecific T abnormalities,  lateral leads Borderline prolonged QT interval No significant change since  last tracing Confirmed by Yeraldine Forney  MD, Jailee Jaquez [phone]) on 08/23/2014  8:09:07 PM      MDM   Final diagnoses:  Left-sided weakness    Patient presents to the ER for evaluation of left-sided weakness. Timing of the onset of symptoms is unclear upon arrival. She also has improving symptoms, initially having left arm and leg weakness which has resolved. She has some mild residual left facial drooping, but family reports that this is  improved as well. Because timing is unclear and the patient has rapidly improving symptoms are mild, she is not a candidate for TPA. CT scan did not show acute infarct. Patient has a pacemaker, is not a candidate for MRI.  Case was discussed with Dr. Hosie Poisson, on call for neurology. He agrees that the patient can be observed in the hospital overnight to make sure she is not having worsening symptoms, no acute interventions at this time. Patient will be admitted to medicine.    Gilda Crease, MD 08/23/14 2222

## 2014-08-23 NOTE — ED Notes (Signed)
Per ems- pt here with c/o left sided weakness noted by her family while on a walk at [facility] home. Pt has hx of multiple TIAs in the past, family states that they always resolve on their own. Ems states that symptoms had resolved when they arrived. Pt in NAD at this time. Left sided droop noted, grips equal.

## 2014-08-24 LAB — LIPID PANEL
Cholesterol: 166 mg/dL (ref 0–200)
HDL: 45 mg/dL (ref >39)
LDL Cholesterol: 83 mg/dL (ref 0–99)
TRIGLYCERIDES: 190 mg/dL — AB (ref <150)
Total CHOL/HDL Ratio: 3.7 RATIO
VLDL: 38 mg/dL (ref 0–40)

## 2014-08-24 MED ORDER — CALCIUM CARBONATE ANTACID 500 MG PO CHEW
1.0000 | CHEWABLE_TABLET | Freq: Three times a day (TID) | ORAL | Status: DC | PRN
  Administered 2014-08-24 – 2014-08-25 (×2): 200 mg via ORAL
  Filled 2014-08-24 (×3): qty 1

## 2014-08-24 NOTE — Progress Notes (Addendum)
STROKE TEAM PROGRESS NOTE   HISTORY [name] is an 81 y.o. female brought to the emergency department for evaluation of left-sided weakness. Patient was with her family on a walk 08/23/2014 (time not documented) when she developed weakness of the left side. This includes left facial droop. Patient reportedly has had frequent TIAs in the past. Symptoms are similar today, however, they lasted longer than they normally do. Symptoms appear to be improving in the ED. CT head imaging reviewed and overall unremarkable. Patient was not administered TPA secondary to resolution of symptoms. She was admitted for further evaluation and treatment.   SUBJECTIVE (INTERVAL HISTORY) No family is at the bedside.  Overall she feels her condition is stable. Her primary language is [place] and [place] is limited.   OBJECTIVE Temp:  [97.4 F (36.3 C)-98.5 F (36.9 C)] 97.8 F (36.6 C) (03/07 0602) Pulse Rate:  [60-71] 65 (03/07 0602) Cardiac Rhythm:  [-] Normal sinus rhythm (03/07 0817) Resp:  [16-22] 16 (03/07 0602) BP: (104-169)/(71-87) 154/71 mmHg (03/07 0602) SpO2:  [94 %-100 %] 94 % (03/07 0602) Weight:  [78.5 kg (173 lb 1 oz)] 78.5 kg (173 lb 1 oz) (03/07 0700)  No results for input(s): GLUCAP in the last 168 hours.  Recent Labs Lab 08/23/14 1835  NA 141  K 4.1  CL 103  CO2 35*  GLUCOSE 84  BUN 18  CREATININE 0.87  CALCIUM 8.5   No results for input(s): AST, ALT, ALKPHOS, BILITOT, PROT, ALBUMIN in the last 168 hours.  Recent Labs Lab 08/23/14 1835  WBC 9.8  NEUTROABS 4.5  HGB 13.3  HCT 40.5  MCV 90.0  PLT 300   No results for input(s): CKTOTAL, CKMB, CKMBINDEX, TROPONINI in the last 168 hours.  Recent Labs  08/23/14 2236  LABPROT 15.4*  INR 1.20    Recent Labs  08/23/14 1835  COLORURINE YELLOW  LABSPEC 1.019  PHURINE 6.5  GLUCOSEU NEGATIVE  HGBUR NEGATIVE  BILIRUBINUR NEGATIVE  KETONESUR NEGATIVE  PROTEINUR NEGATIVE  UROBILINOGEN 1.0  NITRITE NEGATIVE   LEUKOCYTESUR TRACE*       Component Value Date/Time   CHOL 166 08/24/2014 0705   CHOL 165 07/14/2014 1559   TRIG 190* 08/24/2014 0705   TRIG 129 10/06/2009   HDL 45 08/24/2014 0705   HDL 72 07/14/2014 1559   CHOLHDL 3.7 08/24/2014 0705   CHOLHDL 2.3 07/14/2014 1559   VLDL 38 08/24/2014 0705   LDLCALC 83 08/24/2014 0705   LDLCALC 59 07/14/2014 1559   Lab Results  Component Value Date   HGBA1C 5.9* 07/14/2014   No results found for: LABOPIA, COCAINSCRNUR, LABBENZ, AMPHETMU, THCU, LABBARB  No results for input(s): ETH in the last 168 hours.  Ct Head Wo Contrast  08/23/2014   CLINICAL DATA:  Left-sided weakness  EXAM: CT HEAD WITHOUT CONTRAST  TECHNIQUE: Contiguous axial images were obtained from the base of the skull through the vertex without intravenous contrast.  COMPARISON:  CT head 07/26/2014  FINDINGS: Moderate atrophy and moderate chronic microvascular ischemic change throughout the white matter.  Negative for acute infarct. Negative for hemorrhage or mass. No change from the prior CT.  Mucosal edema and small air-fluid level right maxillary sinus.  IMPRESSION: Atrophy and chronic microvascular ischemia. No acute intracranial abnormality.  Air-fluid level right maxillary sinus has developed since the prior CT.   Electronically Signed   By: Marlan Palau M.D.   On: 08/23/2014 19:04   2D Echocardiogram  EF 50% with no source of embolus.  PHYSICAL EXAM Frail elderly lady not in distress. . Afebrile. Head is nontraumatic. Neck is supple without bruit.    Cardiac exam no murmur or gallop. Lungs are clear to auscultation. Distal pulses are well felt. Neurological Exam : limited as patient speaks limited English and no interpreter available. Awake alert interactive. Follows simple one-step commands. Cannot adequately evaluate language and orientation. Extraocular movements are full range without nystagmus. Blinks to threat bilaterally. Pupils equal reactive. Mild left lower facial  asymmetry. Tongue midline. Motor system exam no upper or lower extremity drift. Symmetric equal strength. Cooperative for detailed muscle coordination testing. No obvious focal weakness. Sensation cannot be reliably tested. Plantars downgoing. Dependent reflexes symmetric. Gait was not tested. ASSESSMENT/PLAN [name] is a 81 y.o. female  presenting with left sided facial droop. She did not receive IV t-PA due to symptoms resolved.   Rt Brain small Stroke vs TIA  Resultant  Facial droop resolved  MRI  / MRA  Pacer  Repeat CT in am to confirm / refute stroke  Carotid Doppler  pending   2D Echo  No source of embolus   HgbA1c pending  Heparin 5000 units sq tid for VTE prophylaxis  Diet Heart thin liquids  clopidogrel 75 mg orally every day prior to admission, now on aspirin 81 mg orally every day and clopidogrel 75 mg orally every day. Consider eliquis for secondary stroke prevention  Ongoing aggressive stroke risk factor management  Therapy recommendations:  SNF  Disposition:  pending   Atrial Fibrillation  Home meds:  Not on anticoagulation due to fall risk   Consider eliquis for secondary stroke prevention  Hypertension  Stable  Hyperlipidemia  Home meds:  zocor 40 mg, resumed in hospital  LDL 83, goal < 70  Continue statin at discharge  Diabetes  HgbA1c pending, goal < 7.0  Other Stroke Risk Factors  Advanced age  Obesity, Body mass index is 34.89 kg/(m^2).   Hx stroke/TIA  Coronary artery disease - B RCA stenting  Other Active Problems  Baseline memory loss  Depression  CHF  pacemaker  Hospital day # 1  BIBY,SHARON  [facility] See Amion for Pager information 08/24/2014 9:11 AM  I have personally examined this patient, reviewed notes, independently viewed imaging studies, participated in medical decision making and plan of care. I have made any additions or clarifications directly to the above note. Agree with note  above.  The patient apparently is not a long-term anticoagulation candidate due to fall risk and hence agree with using antiplatelet therapy. She remains at increased risk for recurrent strokes and TIAs and needs ongoing stroke workup and aggressive risk factor modification Delia Heady, MD Medical Director [facility] Pager: [phone] 08/25/2014 1:01 PM    To contact Stroke Continuity provider, please refer to [email or web address]. After hours, contact General Neurology

## 2014-08-24 NOTE — Progress Notes (Signed)
OT Cancellation Note  Patient Details Name: [name] MRN: [id number] DOB: April 30, 1932   Cancelled Treatment:    Reason Eval/Treat Not Completed: Patient at procedure or test/ unavailable - will reattempt.   Angelene Giovanni [place], OTR/L [phone]  08/24/2014, 3:30 PM

## 2014-08-24 NOTE — Progress Notes (Signed)
VASCULAR LAB PRELIMINARY  PRELIMINARY  PRELIMINARY  PRELIMINARY  Carotid duplex completed.    Preliminary report:  Bilateral:  No evidence of DVT, superficial thrombosis, or Baker's Cyst.   Maisen Schmit, RVS 08/24/2014, 4:59 PM

## 2014-08-24 NOTE — Evaluation (Signed)
Physical Therapy Evaluation Patient Details Name: [name] MRN: [id number] DOB: 04-Feb-1932 Today's Date: 08/24/2014   History of Present Illness  Adm with Lt facial droop  and Lt sided weakness;  CT head no acute changes and cannot perform MRI  PMhx- pacemaker, decr memory, bil TKA, TIA's, incontinence, CHF,  vertigo,   Clinical Impression  Pt admitted with above symptoms. Pt currently with functional limitations due to the deficits listed below (see PT Problem List).  Pt will benefit from skilled PT to increase their independence and safety with mobility to allow discharge to the venue listed below.       Follow Up Recommendations SNF;Supervision/Assistance - 24 hour    Equipment Recommendations  None recommended by PT    Recommendations for Other Services OT consult     Precautions / Restrictions Precautions Precautions: Fall Restrictions Weight Bearing Restrictions: No      Mobility  Bed Mobility Overal bed mobility: Needs Assistance Bed Mobility: Rolling;Sidelying to Sit Rolling: Supervision Sidelying to sit: Mod assist       General bed mobility comments: +rail, HOB 0; assist for legs off EOB, and to raise torso; pt able to scoot to EOB with supervision  Transfers Overall transfer level: Needs assistance Equipment used: 1 person hand held assist Transfers: Sit to/from Stand Sit to Stand: Min assist         General transfer comment: x2; steady assist  Ambulation/Gait Ambulation/Gait assistance: Min assist Ambulation Distance (Feet): 10 Feet (toileted; 10 ft) Assistive device: 1 person hand held assist Gait Pattern/deviations: Step-through pattern;Decreased stride length;Antalgic;Wide base of support   Gait velocity interpretation: Below normal speed for age/gender General Gait Details: denied use of RW (via interpreter), however after walking HHA and clearly anxious, she said she does use RW  Stairs            Wheelchair Mobility     Modified Rankin (Stroke Patients Only) Modified Rankin (Stroke Patients Only) Pre-Morbid Rankin Score: Moderately severe disability Modified Rankin: Moderately severe disability     Balance Overall balance assessment: Needs assistance Sitting-balance support: No upper extremity supported;Feet supported Sitting balance-Leahy Scale: Fair     Standing balance support: Single extremity supported Standing balance-Leahy Scale: Poor                               Pertinent Vitals/Pain Pain Assessment: Faces Faces Pain Scale: Hurts little more Pain Location: bil knees with walking Pain Intervention(s): Limited activity within patient's tolerance;Monitored during session;Repositioned    Home Living Family/patient expects to be discharged to:: Skilled nursing facility                 Additional Comments: adm from SNF; was there for therapy since 07/2014; lived with husband and son prior    Prior Function Level of Independence: Needs assistance   Gait / Transfers Assistance Needed: assist with RW  ADL's / Homemaking Assistance Needed: assist with BADLs        Hand Dominance   Dominant Hand: Right    Extremity/Trunk Assessment   Upper Extremity Assessment: Defer to OT evaluation           Lower Extremity Assessment: Generalized weakness      Cervical / Trunk Assessment: Kyphotic  Communication   Communication: Interpreter utilized (via phone)  Cognition Arousal/Alertness: Awake/alert Behavior During Therapy: WFL for tasks assessed/performed Overall Cognitive Status: No family/caregiver present to determine baseline cognitive functioning (+ hospital; - day  or year)                      General Comments      Exercises        Assessment/Plan    PT Assessment Patient needs continued PT services  PT Diagnosis Hemiplegia non-dominant side   PT Problem List Decreased strength;Decreased balance;Decreased mobility;Decreased  cognition;Decreased knowledge of use of DME;Obesity;Pain  PT Treatment Interventions DME instruction;Gait training;Functional mobility training;Therapeutic activities;Balance training;Therapeutic exercise;Cognitive remediation;Patient/family education   PT Goals (Current goals can be found in the Care Plan section) Acute Rehab PT Goals Patient Stated Goal: ultimately to return home PT Goal Formulation: With patient Time For Goal Achievement: 08/31/14 Potential to Achieve Goals: Good    Frequency Min 3X/week   Barriers to discharge        Co-evaluation               End of Session Equipment Utilized During Treatment: Gait belt Activity Tolerance: Patient tolerated treatment well Patient left: in chair;with call bell/phone within reach;with chair alarm set;with nursing/sitter in room Nurse Communication: Mobility status         Time: 9562-1308 PT Time Calculation (min) (ACUTE ONLY): 27 min   Charges:   PT Evaluation $Initial PT Evaluation Tier I: 1 Procedure PT Treatments $Gait Training: 8-22 mins   PT G Codes:        Hisayo Delossantos August 26, 2014, 10:27 AM Pager [phone]

## 2014-08-24 NOTE — Progress Notes (Signed)
[facility] PROGRESS NOTE  [name] ZWC:[id number] DOB: [birth year] DOA: 08/23/2014 PCP: Shannon Seller, NP Interim summary: 81 year old lady with h/o atrial fibrillation, hypertension, h/o CVA in the past comes in for left facial droop. Initial CT head did nto reveal acute pathology. No MRI secondary to pacemaker. She was admitted to telemetry and neurology consulted.  Assessment/Plan: 1. Left sided facial droop: Improved. Suspect CVA on aspirin and plavix. Not sure if she is compliant to meddications. Spoke to her son, and reported that she is at [facility] home and is compliant to medications.  Recommend  Continue aspirin and plavix, will ask her cardiologist if she can be started pm Eliquis. Will discuss with her son too. Repeat CT head to be ordered in am as per stroke team recommendations.  Recent 2 d ECHO. Carotid duplex is pending.  Therapy eval recommending SNF. She will be discharged to SNF possibly in am.     afib rate controlled. Not a candidate for anticoagulation.    Hypertension controlled.   Hyperlipidemia.  Statin on discharge.    Diabetes mellitus: hgba1c pending.     Code Status: full code Family Communication: spoke to son over the phone [facility]position Plan[facility].    Consultants:  neurology  Procedures:  CT head  US duplex of the carotids   Antibiotics:  none  HPI/Subjective: Abdominal discomfort. Wants to use the bathroom.   Objective: Filed Vitals:   08/24/14 0602  BP: 154/71  Pulse: 65  Temp: 97.8 F (36.6 C)  Resp: 16   No intake or output data in the 24 hours ending 08/24/14 1333 Filed Weights   08/24/14 0700  Weight: 78.5 kg (173 lb 1 oz)    Exam:   General:  Alert afebrile comfortable  Cardiovascular: s1s2, irregular,   Respiratory: clear to auscultation, no wheezing or rhonchi  Abdomen: soft non tender non distended bowel sounds.   Musculoskeletal: no pedal edema cyanosis or clubbing.    Data Reviewed: Basic Metabolic Panel:  Recent Labs Lab 08/23/14 1835  NA 141  K 4.1  CL 103  CO2 35*  GLUCOSE 84  BUN 18  CREATININE 0.87  CALCIUM 8.5   Liver Function Tests: No results for input(s): AST, ALT, ALKPHOS, BILITOT, PROT, ALBUMIN in the last 168 hours. No results for input(s): LIPASE, AMYLASE in the last 168 hours. No results for input(s): AMMONIA in the last 168 hours. CBC:  Recent Labs Lab 08/23/14 1835  WBC 9.8  NEUTROABS 4.5  HGB 13.3  HCT 40.5  MCV 90.0  PLT 300   Cardiac Enzymes: No results for input(s): CKTOTAL, CKMB, CKMBINDEX, TROPONINI in the last 168 hours. BNP (last 3 results) No results for input(s): BNP in the last 8760 hours.  ProBNP (last 3 results) No results for input(s): PROBNP in the last 8760 hours.  CBG: No results for input(s): GLUCAP in the last 168 hours.  No results found for this or any previous visit (from the past 240 hour(s)).   Studies: Ct Head Wo Contrast  08/23/2014   CLINICAL DATA:  Left-sided weakness  EXAM: CT HEAD WITHOUT CONTRAST  TECHNIQUE: Contiguous axial images were obtained from the base of the skull through the vertex without intravenous contrast.  COMPARISON:  CT head 07/26/2014  FINDINGS: Moderate atrophy and moderate chronic microvascular ischemic change throughout the white matter.  Negative for acute infarct. Negative for hemorrhage or mass. No change from the prior CT.  Mucosal edema and small air-fluid level right  maxillary sinus.  IMPRESSION: Atrophy and chronic microvascular ischemia. No acute intracranial abnormality.  Air-fluid level right maxillary sinus has developed since the prior CT.   Electronically Signed   By: [name] M.D.   On: 08/23/2014 19:04    Scheduled Meds: . aspirin EC  81 mg Oral Daily  . clopidogrel  75 mg Oral Daily  . heparin  5,000 Units Subcutaneous 3 times per day  . methimazole  5 mg Oral Once per day on Mon Wed Fri  . polyvinyl alcohol  1 drop Both Eyes BID  .  simvastatin  40 mg Oral q1800   Continuous Infusions:   Principal Problem:   Acute ischemic stroke Active Problems:   PACEMAKER-[employer]   Left-sided weakness    Time spent: 25 MINUTES    [name]  [facility] Pager [phone] If 7PM-7AM, please contact night-coverage at [email or web address], password [facility] 08/24/2014, 1:33 PM  LOS: 1 day

## 2014-08-24 NOTE — Progress Notes (Signed)
SLP Cancellation Note  Patient Details Name: [name] MRN: [id number] DOB: 1932-03-27   Cancelled treatment:       Reason Eval/Treat Not Completed: Patient at procedure or test/unavailable (pt about to be transported for testing per RN). Will return as able.   Maxcine Ham, M.A. CCC-SLP [phone]  Maxcine Ham 08/24/2014, 3:14 PM

## 2014-08-24 NOTE — Clinical Social Work Psychosocial (Signed)
Clinical Social Work Department BRIEF PSYCHOSOCIAL ASSESSMENT 08/24/2014  Patient:  [name], [name]     Account Number:  [id number]     Admit date:  08/23/2014  Clinical Social Worker:  Bo Mcclintock  Date/Time:  08/24/2014 12:33 PM  Referred by:  RN  Date Referred:  08/24/2014 Referred for  SNF Placement   Other Referral:   Interview type:  Family Other interview type:   Pt is oriented to self; Pt's son [name] [phone]    PSYCHOSOCIAL DATA Living Status:  FACILITY Admitted from facility[facility]  [facility] Level of care:  Skilled Nursing Facility Primary support name:  [name] Primary support relationship to patient:  SPOUSE Degree of support available:   Strong Support    CURRENT CONCERNS Current Concerns  None Noted   Other Concerns:    SOCIAL WORK ASSESSMENT / PLAN CSW spoke with the pt's son [name].  CSW introduced self and purpose of the call. [name] confirmed the pt was receiving rehab prior to being admitted from [facility]. [name] confirmed a bed hold was place for the pt to return back to [facility]. CSW answered all questions in which [name] inquired about. CSW provided [name] with contact information for further questions. CSW will continue to follow this pt and assist with discharge as needed.   Assessment/plan status:  Psychosocial Support/Ongoing Assessment of Needs Other assessment/ plan:   Information/referral to community resources:    PATIENT'S/FAMILY'S RESPONSE TO CURRENT DIAGNOSE: [name] reported feeling on edge while he and the family await the pt's test result. [name] reported the pt had a TIA in the past. [name] reported being uneasy to see the pt with a facial droop and slurred speech.   PATIENT'S/FAMILY'S RESPONSE TO PLAN OF CARE: [name] was receptive and agreeable for the pt to continue rehab at [facility] then transition back home once stable.    [name], MSW, LCSWA [phone]

## 2014-08-25 ENCOUNTER — Inpatient Hospital Stay (HOSPITAL_COMMUNITY): Payer: Medicare Other

## 2014-08-25 DIAGNOSIS — Z7901 Long term (current) use of anticoagulants: Secondary | ICD-10-CM

## 2014-08-25 LAB — COMPREHENSIVE METABOLIC PANEL
ALBUMIN: 3 g/dL — AB (ref 3.5–5.2)
ALT: 14 U/L (ref 0–35)
AST: 16 U/L (ref 0–37)
Alkaline Phosphatase: 47 U/L (ref 39–117)
Anion gap: 12 (ref 5–15)
BUN: 16 mg/dL (ref 6–23)
CHLORIDE: 105 mmol/L (ref 96–112)
CO2: 24 mmol/L (ref 19–32)
CREATININE: 0.86 mg/dL (ref 0.50–1.10)
Calcium: 9.1 mg/dL (ref 8.4–10.5)
GFR calc Af Amer: 72 mL/min — ABNORMAL LOW (ref >90)
GFR calc non Af Amer: 62 mL/min — ABNORMAL LOW (ref >90)
Glucose, Bld: 146 mg/dL — ABNORMAL HIGH (ref 70–99)
Potassium: 4.1 mmol/L (ref 3.5–5.1)
Sodium: 141 mmol/L (ref 135–145)
Total Bilirubin: 0.5 mg/dL (ref 0.3–1.2)
Total Protein: 6.2 g/dL (ref 6.0–8.3)

## 2014-08-25 LAB — HEMOGLOBIN A1C
Hgb A1c MFr Bld: 6.2 % — ABNORMAL HIGH (ref 4.8–5.6)
Mean Plasma Glucose: 131 mg/dL

## 2014-08-25 LAB — LIPASE, BLOOD: Lipase: 36 U/L (ref 11–59)

## 2014-08-25 MED ORDER — PANTOPRAZOLE SODIUM 40 MG PO TBEC
40.0000 mg | DELAYED_RELEASE_TABLET | Freq: Every day | ORAL | Status: DC
  Administered 2014-08-25 – 2014-08-26 (×2): 40 mg via ORAL
  Filled 2014-08-25 (×2): qty 1

## 2014-08-25 MED ORDER — APIXABAN 5 MG PO TABS
5.0000 mg | ORAL_TABLET | Freq: Two times a day (BID) | ORAL | Status: DC
  Administered 2014-08-26: 5 mg via ORAL
  Filled 2014-08-25: qty 1

## 2014-08-25 MED ORDER — PANTOPRAZOLE SODIUM 40 MG PO TBEC: 40.0000 mg | DELAYED_RELEASE_TABLET | Freq: Every day | ORAL | Status: DC

## 2014-08-25 NOTE — Progress Notes (Signed)
[facility] PROGRESS NOTE  [name] OYD:[id number] DOB: 1932/01/10 DOA: 08/23/2014 PCP: [name], NP Interim summary: 81 year old lady with h/o atrial fibrillation, hypertension, h/o CVA in the past comes in for left facial droop. Initial CT head did nto reveal acute pathology. No MRI secondary to pacemaker. She was admitted to telemetry and neurology consulted.  Assessment/Plan: 1. Left sided facial droop: Improved , at home she is on aspirin and plavix. Not sure if she is compliant to meddications. Spoke to her son, and reported that she is at [facility] home and is compliant to medications.  becauser her TIA'S stroke team recommended starting her on eliquis for secondary stroke prevention. Requested cardiology for further recommendations.  Recent 2 d ECHO. Carotid duplex  Does not show any significant stenosis. .  Therapy eval recommending SNF. She will be discharged to SNF possibly in am.     Afib rate controlled. Rate controlled. Initially she was not a candidate for anticoagulation. Because of her TIA's, and stroke , stroke team recommending eliquis for secondary stroke prevention.    Hypertension controlled.   Hyperlipidemia.  Statin on discharge.    Diabetes mellitus: hgba1c IS 6.2.  CBG (last 3)  No results for input(s): GLUCAP in the last 72 hours.   Left lower extremity tenderness: ordered venous duplex to evaluate for DVT.    Code Status: full code Family Communication: spoke to son over the phone extensively.  Disposition Plan: [facility].    Consultants:  neurology  Procedures:  CT head  US duplex of the carotids   Antibiotics:  none  HPI/Subjective: Reports feeling weak. Wants to know when she can go back.   Objective: Filed Vitals:   08/25/14 1341  BP: 146/63  Pulse: 82  Temp: 98.4 F (36.9 C)  Resp: 20    Intake/Output Summary (Last 24 hours) at 08/25/14 1505 Last data filed at 08/25/14 1341  Gross per  24 hour  Intake    360 ml  Output      0 ml  Net    360 ml   Filed Weights   08/24/14 0700  Weight: 78.5 kg (173 lb 1 oz)    Exam:   General:  Alert afebrile comfortable  Cardiovascular: s1s2, irregular,   Respiratory: clear to auscultation, no wheezing or rhonchi  Abdomen: soft non tender non distended bowel sounds.   Musculoskeletal: both legs tender to palpation.   Data Reviewed: Basic Metabolic Panel:  Recent Labs Lab 08/23/14 1835  NA 141  K 4.1  CL 103  CO2 35*  GLUCOSE 84  BUN 18  CREATININE 0.87  CALCIUM 8.5   Liver Function Tests: No results for input(s): AST, ALT, ALKPHOS, BILITOT, PROT, ALBUMIN in the last 168 hours. No results for input(s): LIPASE, AMYLASE in the last 168 hours. No results for input(s): AMMONIA in the last 168 hours. CBC:  Recent Labs Lab 08/23/14 1835  WBC 9.8  NEUTROABS 4.5  HGB 13.3  HCT 40.5  MCV 90.0  PLT 300   Cardiac Enzymes: No results for input(s): CKTOTAL, CKMB, CKMBINDEX, TROPONINI in the last 168 hours. BNP (last 3 results) No results for input(s): BNP in the last 8760 hours.  ProBNP (last 3 results) No results for input(s): PROBNP in the last 8760 hours.  CBG: No results for input(s): GLUCAP in the last 168 hours.  No results found for this or any previous visit (from the past 240 hour(s)).   Studies: Ct Head Wo Contrast  08/25/2014   CLINICAL DATA:  Stroke, followup  EXAM: CT HEAD WITHOUT CONTRAST  TECHNIQUE: Contiguous axial images were obtained from the base of the skull through the vertex without intravenous contrast.  COMPARISON:  08/23/2014  FINDINGS: Generalized atrophy.  Normal ventricular morphology.  No midline shift or mass effect.  Small vessel chronic ischemic changes of deep cerebral white matter.  Old infarct LEFT cerebellar hemisphere.  No intracranial hemorrhage, mass lesion or evidence acute infarction.  Question old small lacunar infarct at LEFT caudate.  Atherosclerotic calcifications in  the internal carotid and vertebral arteries at skullbase.  Mucosal thickening with minimal fluid dependently in RIGHT maxillary sinus.  No acute osseous findings.  No extra-axial fluid collections.  IMPRESSION: Atrophy with small vessel chronic ischemic changes of deep cerebral white matter.  Old LEFT cerebellar and question old LEFT caudate infarct.  No acute intracranial abnormalities.   Electronically Signed   By: Ulyses Southward M.D.   On: 08/25/2014 07:28   Ct Head Wo Contrast  08/23/2014   CLINICAL DATA:  Left-sided weakness  EXAM: CT HEAD WITHOUT CONTRAST  TECHNIQUE: Contiguous axial images were obtained from the base of the skull through the vertex without intravenous contrast.  COMPARISON:  CT head 07/26/2014  FINDINGS: Moderate atrophy and moderate chronic microvascular ischemic change throughout the white matter.  Negative for acute infarct. Negative for hemorrhage or mass. No change from the prior CT.  Mucosal edema and small air-fluid level right maxillary sinus.  IMPRESSION: Atrophy and chronic microvascular ischemia. No acute intracranial abnormality.  Air-fluid level right maxillary sinus has developed since the prior CT.   Electronically Signed   By: Marlan Palau M.D.   On: 08/23/2014 19:04   Dg Chest Port 1 View  08/25/2014   CLINICAL DATA:  Cough.  EXAM: PORTABLE CHEST - 1 VIEW  COMPARISON:  07/28/2014  FINDINGS: Multi lead pacer apparatus, stable in position. Markedly low lung volumes. Stable enlarged cardiac and mediastinal contours with marked tortuosity of the thoracic aorta. Left-greater-than-right basilar heterogeneous pulmonary opacities. No definite pneumothorax.  IMPRESSION: Markedly low lung volumes. Left-greater-than-right basilar heterogeneous opacities favored to represent atelectasis. Infection not excluded.   Electronically Signed   By: Annia Belt M.D.   On: 08/25/2014 12:40    Scheduled Meds: . aspirin EC  81 mg Oral Daily  . clopidogrel  75 mg Oral Daily  . heparin  5,000  Units Subcutaneous 3 times per day  . methimazole  5 mg Oral Once per day on Mon Wed Fri  . pantoprazole  40 mg Oral Q0600  . polyvinyl alcohol  1 drop Both Eyes BID  . simvastatin  40 mg Oral q1800   Continuous Infusions:   Principal Problem:   Acute ischemic stroke Active Problems:   PACEMAKER-[employer]   Left-sided weakness    Time spent: 25 MINUTES    Flynt Breeze  [facility] Pager [phone] If 7PM-7AM, please contact night-coverage at [email or web address], password [facility] 08/25/2014, 3:05 PM  LOS: 2 days

## 2014-08-25 NOTE — Progress Notes (Signed)
Pt complaining of pain in left leg. md notified. BLE dopplers ordered.

## 2014-08-25 NOTE — Evaluation (Signed)
Occupational Therapy Evaluation Patient Details Name: [name] MRN: [id number] DOB: 1931/07/15 Today's Date: 08/25/2014    History of Present Illness Adm with Lt facial droop  and Lt sided weakness;  CT head no acute changes and cannot perform MRI  PMhx- pacemaker, decr memory, bil TKA, TIA's, incontinence, CHF,  vertigo,    Clinical Impression   Pt admitted with the above diagnoses and presents with below problem list. Pt will benefit from continued OT to address the below listed deficits and maximize independence with BADLs prior to d/c to next venue. PTA pt was likely min assist with ADLs. Pt currently at mod A level for ADLs.  OT to continue to follow acutely.     Follow Up Recommendations  SNF    Equipment Recommendations  None recommended by OT    Recommendations for Other Services       Precautions / Restrictions Precautions Precautions: Fall Restrictions Weight Bearing Restrictions: No      Mobility Bed Mobility Overal bed mobility: Needs Assistance Bed Mobility: Rolling;Sidelying to Sit;Sit to Sidelying Rolling: Supervision Sidelying to sit: Mod assist     Sit to sidelying: Mod assist General bed mobility comments: assist to power left hip forward  to scoot forward EOB; pt utilized bed rails; asssit at torso and BLE  Transfers Overall transfer level: Needs assistance Equipment used: 1 person hand held assist;Rolling walker (2 wheeled) Transfers: Sit to/from Stand Sit to Stand: Mod assist;From elevated surface         General transfer comment: attempted woth rw completed with 1 person HHA    Balance Overall balance assessment: Needs assistance Sitting-balance support: Bilateral upper extremity supported;Feet supported Sitting balance-Leahy Scale: Fair     Standing balance support: Single extremity supported;During functional activity Standing balance-Leahy Scale: Poor Standing balance comment: needs support for balance                             ADL Overall ADL's : Needs assistance/impaired Eating/Feeding: Set up;Sitting   Grooming: Set up;Sitting   Upper Body Bathing: Sitting;Min guard   Lower Body Bathing: Moderate assistance;Sit to/from stand   Upper Body Dressing : Sitting;Min guard   Lower Body Dressing: Moderate assistance;Sit to/from stand   Toilet Transfer: Moderate assistance;Squat-pivot;BSC   Toileting- Clothing Manipulation and Hygiene: Moderate assistance;Sit to/from stand   Tub/ Engineer, structural: Moderate assistance;Squat-pivot;3 in 1     General ADL Comments: Pt needing mod A for LB ADLs for balance and power up. Pt reporting dizziness during session. Mod A for bed mobility.     Vision Vision Assessment?: Yes Eye Alignment: Within Functional Limits Ocular Range of Motion: Within Functional Limits Alignment/Gaze Preference: Within Defined Limits Tracking/Visual Pursuits: Other (comment) (unable to maintain tracking possibly due to communciation ) Visual Fields: No apparent deficits   Perception     Praxis      Pertinent Vitals/Pain Pain Assessment: Faces Faces Pain Scale: Hurts little more Pain Location: Lt calf area in supine and EOB Pain Intervention(s): Limited activity within patient's tolerance;Monitored during session;Repositioned     Hand Dominance Right   Extremity/Trunk Assessment Upper Extremity Assessment Upper Extremity Assessment: Generalized weakness (tremors with movement)   Lower Extremity Assessment Lower Extremity Assessment: Defer to PT evaluation   Cervical / Trunk Assessment Cervical / Trunk Assessment: Kyphotic   Communication Communication Communication: Interpreter utilized;Other (comment) (interpreter reports decreased articulation)   Cognition Arousal/Alertness: Awake/alert Behavior During Therapy: WFL for tasks assessed/performed Overall Cognitive Status:  History of cognitive impairments - at baseline                     General  Comments       Exercises       Shoulder Instructions      Home Living Family/patient expects to be discharged to:: Skilled nursing facility                                 Additional Comments: adm from SNF; was there for therapy since 07/2014; lived with husband and son prior      Prior Functioning/Environment Level of Independence: Needs assistance    ADL's / Homemaking Assistance Needed: unclear how much assist with BADLs; pt's spouse indicates she was mod I before February admission Communication / Swallowing Assistance Needed: Primary language is [place]. Interpreter reports decreased articulation, difficulty understanding pt in preferred language.  Comments: Husband on O2. Adult children - one lives in [place]     OT Diagnosis: Generalized weakness;Cognitive deficits   OT Problem List: Decreased strength;Decreased activity tolerance;Impaired balance (sitting and/or standing);Decreased coordination;Decreased cognition;Decreased safety awareness;Decreased knowledge of use of DME or AE;Decreased knowledge of precautions   OT Treatment/Interventions: Self-care/ADL training;Therapeutic exercise;Neuromuscular education;Energy conservation;DME and/or AE instruction;Therapeutic activities;Visual/perceptual remediation/compensation;Cognitive remediation/compensation;Patient/family education;Balance training    OT Goals(Current goals can be found in the care plan section) Acute Rehab OT Goals OT Goal Formulation: With patient Time For Goal Achievement: 09/08/14 Potential to Achieve Goals: Good ADL Goals Pt Will Perform Lower Body Bathing: with min guard assist;sit to/from stand Pt Will Perform Lower Body Dressing: with min guard assist;sit to/from stand Pt Will Transfer to Toilet: with min guard assist;bedside commode;ambulating Pt Will Perform Toileting - Clothing Manipulation and hygiene: with min guard assist;sit to/from stand Additional ADL Goal #1: Pt will preform bed  mobility at min guard level to prepare for OOB ADLs.  OT Frequency: Min 3X/week   Barriers to D/C: Decreased caregiver support          Co-evaluation              End of Session Equipment Utilized During Treatment: Gait belt Nurse Communication: Other (comment) (Lt calf pain, pain with dorsiflexion)  Activity Tolerance: Patient tolerated treatment well;Other (comment) (Limited by dizziness.) Patient left: in bed;with call bell/phone within reach;with bed alarm set   Time: 1358-1440 OT Time Calculation (min): 42 min Charges:  OT General Charges $OT Visit: 1 Procedure OT Evaluation $Initial OT Evaluation Tier I: 1 Procedure OT Treatments $Therapeutic Activity: 8-22 mins G-Codes:    Pilar Grammes Aug 29, 2014, 3:19 PM

## 2014-08-25 NOTE — Progress Notes (Signed)
Patient left unit to get CT head done at this time.

## 2014-08-25 NOTE — Progress Notes (Signed)
STROKE TEAM PROGRESS NOTE   HISTORY [name] is an 81 y.o. female brought to the emergency department for evaluation of left-sided weakness. Patient was with her family on a walk 08/23/2014 (time not documented) when she developed weakness of the left side. This includes left facial droop. Patient reportedly has had frequent TIAs in the past. Symptoms are similar today, however, they lasted longer than they normally do. Symptoms appear to be improving in the ED. CT head imaging reviewed and overall unremarkable. Patient was not administered TPA secondary to resolution of symptoms. She was admitted for further evaluation and treatment.   SUBJECTIVE (INTERVAL HISTORY) No family at bedside. Patient lives with her husband. Anxious to get home to him.   OBJECTIVE Temp:  [97.7 F (36.5 C)-98.4 F (36.9 C)] 97.9 F (36.6 C) (03/08 0910) Pulse Rate:  [65-71] 71 (03/08 0910) Cardiac Rhythm:  [-] Normal sinus rhythm (03/07 2000) Resp:  [16-20] 20 (03/08 0910) BP: (125-157)/(62-89) 149/80 mmHg (03/08 0910) SpO2:  [94 %-98 %] 96 % (03/08 0910)  No results for input(s): GLUCAP in the last 168 hours.  Recent Labs Lab 08/23/14 1835  NA 141  K 4.1  CL 103  CO2 35*  GLUCOSE 84  BUN 18  CREATININE 0.87  CALCIUM 8.5   No results for input(s): AST, ALT, ALKPHOS, BILITOT, PROT, ALBUMIN in the last 168 hours.  Recent Labs Lab 08/23/14 1835  WBC 9.8  NEUTROABS 4.5  HGB 13.3  HCT 40.5  MCV 90.0  PLT 300   No results for input(s): CKTOTAL, CKMB, CKMBINDEX, TROPONINI in the last 168 hours.  Recent Labs  08/23/14 2236  LABPROT 15.4*  INR 1.20    Recent Labs  08/23/14 1835  COLORURINE YELLOW  LABSPEC 1.019  PHURINE 6.5  GLUCOSEU NEGATIVE  HGBUR NEGATIVE  BILIRUBINUR NEGATIVE  KETONESUR NEGATIVE  PROTEINUR NEGATIVE  UROBILINOGEN 1.0  NITRITE NEGATIVE  LEUKOCYTESUR TRACE*       Component Value Date/Time   CHOL 166 08/24/2014 0705   CHOL 165 07/14/2014 1559   TRIG  190* 08/24/2014 0705   TRIG 129 10/06/2009   HDL 45 08/24/2014 0705   HDL 72 07/14/2014 1559   CHOLHDL 3.7 08/24/2014 0705   CHOLHDL 2.3 07/14/2014 1559   VLDL 38 08/24/2014 0705   LDLCALC 83 08/24/2014 0705   LDLCALC 59 07/14/2014 1559   Lab Results  Component Value Date   HGBA1C 6.2* 08/24/2014   No results found for: LABOPIA, COCAINSCRNUR, LABBENZ, AMPHETMU, THCU, LABBARB  No results for input(s): ETH in the last 168 hours.  Ct Head Wo Contrast  08/25/2014   CLINICAL DATA:  Stroke, followup  EXAM: CT HEAD WITHOUT CONTRAST  TECHNIQUE: Contiguous axial images were obtained from the base of the skull through the vertex without intravenous contrast.  COMPARISON:  08/23/2014  FINDINGS: Generalized atrophy.  Normal ventricular morphology.  No midline shift or mass effect.  Small vessel chronic ischemic changes of deep cerebral white matter.  Old infarct LEFT cerebellar hemisphere.  No intracranial hemorrhage, mass lesion or evidence acute infarction.  Question old small lacunar infarct at LEFT caudate.  Atherosclerotic calcifications in the internal carotid and vertebral arteries at skullbase.  Mucosal thickening with minimal fluid dependently in RIGHT maxillary sinus.  No acute osseous findings.  No extra-axial fluid collections.  IMPRESSION: Atrophy with small vessel chronic ischemic changes of deep cerebral white matter.  Old LEFT cerebellar and question old LEFT caudate infarct.  No acute intracranial abnormalities.   Electronically Signed  By: Ulyses Southward M.D.   On: 08/25/2014 07:28   Ct Head Wo Contrast  08/23/2014   CLINICAL DATA:  Left-sided weakness  EXAM: CT HEAD WITHOUT CONTRAST  TECHNIQUE: Contiguous axial images were obtained from the base of the skull through the vertex without intravenous contrast.  COMPARISON:  CT head 07/26/2014  FINDINGS: Moderate atrophy and moderate chronic microvascular ischemic change throughout the white matter.  Negative for acute infarct. Negative for  hemorrhage or mass. No change from the prior CT.  Mucosal edema and small air-fluid level right maxillary sinus.  IMPRESSION: Atrophy and chronic microvascular ischemia. No acute intracranial abnormality.  Air-fluid level right maxillary sinus has developed since the prior CT.   Electronically Signed   By: Marlan Palau M.D.   On: 08/23/2014 19:04   2D Echocardiogram  EF 50% with no source of embolus.   Carotid Doppler  There is 1-39% bilateral ICA stenosis. Vertebral artery flow is antegrade.     PHYSICAL EXAM Frail elderly lady not in distress. . Afebrile. Head is nontraumatic. Neck is supple without bruit.    Cardiac exam no murmur or gallop. Lungs are clear to auscultation. Distal pulses are well felt. Neurological Exam : limited as patient speaks limited English and no interpreter available. Awake alert interactive. Follows simple one-step commands. Cannot adequately evaluate language and orientation. Extraocular movements are full range without nystagmus. Blinks to threat bilaterally. Pupils equal reactive. Mild left lower facial asymmetry. Tongue midline. Motor system exam no upper or lower extremity drift. Symmetric equal strength. Cooperative for detailed muscle coordination testing. No obvious focal weakness. Sensation cannot be reliably tested. Plantars downgoing. Dependent reflexes symmetric. Gait was not tested.  ASSESSMENT/PLAN [name] is a 81 y.o. female  presenting with left sided facial droop. She did not receive IV t-PA due to symptoms resolved.   TIA  Resultant  Facial droop resolved  MRI  / MRA  Pacer  Repeat CT does not confirm new stroke  Carotid Doppler  No significant stenosis   2D Echo  No source of embolus   HgbA1c 6.2, WNL  Heparin 5000 units sq tid for VTE prophylaxis  Diet Heart thin liquids  clopidogrel 75 mg orally every day prior to admission, now on aspirin 81 mg orally every day and clopidogrel 75 mg orally every day. Consider eliquis  for secondary stroke prevention  Ongoing aggressive stroke risk factor management  Therapy recommendations:  SNF  Disposition: return to [facility]  NO FURTHER STROKE WORKUP INDICATED Patient has a 10-15% risk of having another stroke over the next year, the highest risk is within 2 weeks of the most recent stroke/TIA (risk of having a stroke following a stroke or TIA is the same). Ongoing risk factor control by Primary Care Physician Stroke Service will sign off. Please call should any needs arise. Follow-up Stroke Clinic at [facility] with Dr. Delia Heady in 2 months, order placed.  Atrial Fibrillation  Home meds:  Not on anticoagulation due to fall risk   Consider eliquis for secondary stroke prevention  Hypertension  Stable  Hyperlipidemia  Home meds:  zocor 40 mg, resumed in hospital  LDL 83, goal < 70  Continue statin at discharge  Other Stroke Risk Factors  Advanced age  Obesity, Body mass index is 34.89 kg/(m^2).   Hx stroke/TIA  Coronary artery disease - B RCA stenting  Other Active Problems  Baseline memory loss  Depression  CHF  pacemaker  Hospital day # 2  Rhoderick [facility] See Amion for Pager information 08/25/2014 11:06 AM  I have personally examined this patient, reviewed notes, independently viewed imaging studies, participated in medical decision making and plan of care. I have made any additions or clarifications directly to the above note. Agree with note above. Stroke team will sign off kindly call for questions.  Delia Heady, MD Medical Director [facility] Pager: [phone] 08/25/2014 1:11 PM    To contact Stroke Continuity provider, please refer to [email or web address]. After hours, contact General Neurology

## 2014-08-25 NOTE — Consult Note (Signed)
CARDIOLOGY CONSULT NOTE   Patient ID: [name] MRN: [id number] DOB/AGE: Sep 23, 1931 81 y.o.  Admit date: 08/23/2014  Primary Physician   [name], NP Primary Cardiologist   Dr. Ross/ Dr. Ladona Ridgel  Reason for Consultation  Question about starting anticoagulation  HPI: [name] is a 81 y.o. female with a history of HTN, tachy-brady syndrome s/p PPM (2011), CVA, CAD s/p BMS to RCA with ISR and overlapping DES to RCA in 2011, PAF not on [facility] due to falls, dementia, and unsteady gait (walks with walker) who was admitted to [facility] on 08/23/14 with left sided weakness. Patient felt to have a TIA and wants cardiology opinion on starting Eliquis for secondary stroke prevention.  She has history of CAD with BMS to the RCA. Last cath in December 2011 showed mild CAD in LCx and LAD. The RCA had diffuse in-stent restenosis.There was then total occlusion of the mid/distal RCA with extensive thrombus.She underwent overlapping DES placement. She has been maintained on ASA/Plavix.  At a 2013 visit, she was found to have atrial fibrillation on device interrogation but she was felt to be too high of a fall risk for long term AC despite patient's recent stroke at that time. Dr Ladona Ridgel wrote: " The patient's stroke may be related to atrial fibrillation. Unfortunately, she is unsteady on her feet and has fallen. I think systemic anticoagulation with warfarin would be contraindicated at this point and I discussed this issue with her primary cardiologist, Dr. Huston Foley who is in agreement."  This sentiment has been maintained through her last visit with Dr. Tenny Craw in 01/2014.  The patient presented to [facility] on 08/23/14 for possible TIA. Patient was with her family on a walk 08/23/2014 (time not documented) when she developed weakness of the left side. This includes left facial droop. Patient reportedly has had frequent TIAs in the past. Symptoms were similar this presentation, however, they lasted longer  than they normally do. CT head imaging reviewed by neuro and overall unremarkable. No MRI due to PPM.Patient was not administered TPA secondary to resolution of symptoms. She was admitted for further evaluation and treatment. Dr Pearlean Brownie felt that the patient has a 10-15% risk of having another stroke over the next year, the highest risk is within 2 weeks of the most recent stroke/TIA (risk of having a stroke following a stroke or TIA is the same) and that Eliquis may be considered for secondary stroke prevention.    Past Medical History  Diagnosis Date  . Lumbar back pain   . Other urinary incontinence   . Memory loss   . DJD (degenerative joint disease)   . Depression   . Hypertension   . CVA (cerebral infarction)   . CHF (congestive heart failure)     Chronic Diastolic  . CAD (coronary artery disease)   . Presence of permanent cardiac pacemaker   . Osteoporosis   . Hypercholesterolemia   . Hyperthyroidism   . Anxiety   . Vertigo   . Tachy-brady syndrome   . Ischemic cardiomyopathy      Past Surgical History  Procedure Laterality Date  . Pacemaker insertion  2011  . Total knee arthroplasty  2002    bilateral  . Abdominal hysterectomy  1986  . Bilateral stenting  1999    to the RCA  . Cardiac catheterization  05/23/10  . Total abdominal hysterectomy w/ bilateral salpingoophorectomy      Allergies  Allergen Reactions  . Iodine  Other (See Comments)    unknown  . Iohexol Hives     Code: HIVES, Desc: hives with nonionic contrast 7 yrs ago ([place] iv benedryl given   . Peanut-Containing Drug Products Other (See Comments)    unknown    I have reviewed the patient's current medications . aspirin EC  81 mg Oral Daily  . clopidogrel  75 mg Oral Daily  . heparin  5,000 Units Subcutaneous 3 times per day  . methimazole  5 mg Oral Once per day on Mon Wed Fri  . pantoprazole  40 mg Oral Q0600  . polyvinyl alcohol  1 drop Both Eyes BID  . simvastatin  40 mg Oral q1800       calcium carbonate, polyethylene glycol  Prior to Admission medications   Medication Sig Start Date End Date Taking? Authorizing Provider  alendronate (FOSAMAX) 70 MG tablet Take 1 tablet (70 mg total) by mouth once a week. Take with a full glass of water on an empty stomach. 07/14/14  Yes Mahima Glade Lloyd, MD  Ascorbic Acid (VITAMIN C) 500 MG tablet Take 500 mg by mouth daily.     Yes Historical Provider, MD  aspirin ([facility] ASPIRIN LOW DOSE) 81 MG EC tablet TAKE 1 TABLET (81 MG TOTAL) BY MOUTH DAILY. SWALLOW WHOLE. 02/19/14  Yes Pricilla Riffle, MD  Calcium Carbonate-Vit D-Min 600-400 MG-UNIT TABS Take 1 tablet by mouth 2 (two) times daily.    Yes Historical Provider, MD  clopidogrel (PLAVIX) 75 MG tablet Take 1 tablet (75 mg total) by mouth daily. 02/13/14  Yes Pricilla Riffle, MD  lisinopril (PRINIVIL,ZESTRIL) 10 MG tablet Take 0.5 tablets (5 mg total) by mouth daily. 07/30/14  Yes Shanker Levora Dredge, MD  methimazole (TAPAZOLE) 5 MG tablet Take one tablet by mouth three days a week as prescribed by endocrinology 06/10/14  Yes Kimber Relic, MD  Multiple Vitamins-Minerals (CENTRUM SILVER) tablet Take 1 tablet by mouth daily.     Yes Historical Provider, MD  polyethylene glycol (MIRALAX / GLYCOLAX) packet Take 17 g by mouth daily as needed for moderate constipation.   Yes Historical Provider, MD  Propylene Glycol (SYSTANE BALANCE) 0.6 % SOLN Apply 1 drop to eye 2 (two) times daily. 01/07/14  Yes Mahima Glade Lloyd, MD  simvastatin (ZOCOR) 40 MG tablet Take 1 tablet (40 mg total) by mouth daily at 6 PM. 02/13/14  Yes Pricilla Riffle, MD  nitroGLYCERIN (NITROSTAT) 0.4 MG SL tablet Place 1 tablet (0.4 mg total) under the tongue every 5 (five) minutes as needed. For chest pain 03/31/14   Oneal Grout, MD  pantoprazole (PROTONIX) 40 MG tablet Take 1 tablet (40 mg total) by mouth daily at 6 (six) AM. 08/25/14   Kathlen Mody, MD  polyethylene glycol powder (GLYCOLAX/MIRALAX) powder MIX 1/2 CAPFUL WITH 8 OUNCES OF WATER AND TAKE AT  BEDTIME AS NEEDED FOR CONSTIPATION 03/27/14   Kermit Balo, DO     History   Social History  . Marital Status: Married    Spouse Name: N/A  . Number of Children: N/A  . Years of Education: N/A   Occupational History  . Not on file.   Social History Main Topics  . Smoking status: Never Smoker   . Smokeless tobacco: Not on file  . Alcohol Use: No  . Drug Use: No  . Sexual Activity: Not on file   Other Topics Concern  . Not on file   Social History Narrative    No family status information  on file.   Family History  Problem Relation Age of Onset  . Thyroid disease Daughter   . Diabetes Other   . Hypertension Other   . Arthritis Other   . Coronary artery disease Mother 76  . Coronary artery disease Father 58     ROS:  Full 14 point review of systems complete and found to be negative unless listed above.  Physical Exam: Blood pressure 146/63, pulse 82, temperature 98.4 F (36.9 C), temperature source Oral, resp. rate 20, height 4' 11.06" (1.5 m), weight 173 lb 1 oz (78.5 kg), SpO2 94 %.  General: Well developed, well nourished, female in no acute distress Head: Eyes PERRLA, No xanthomas.   Normocephalic and atraumatic, oropharynx without edema or exudate. Dentition:  Lungs: CTAB Heart: HRRR S1 S2, no rub/gallop, Heart irregular rate and rhythm with S1, S2  murmur. pulses are 2+ extrem.   Neck: No carotid bruits. No lymphadenopathy.  No JVD. Abdomen: Bowel sounds present, abdomen soft and non-tender without masses or hernias noted. Msk:  No spine or cva tenderness. No weakness, no joint deformities or effusions. Extremities: No clubbing or cyanosis. No  edema.  Neuro: Alert and oriented X 3. No focal deficits noted. Psych:  Good affect, responds appropriately Skin: No rashes or lesions noted.  Labs:   Lab Results  Component Value Date   WBC 9.8 08/23/2014   HGB 13.3 08/23/2014   HCT 40.5 08/23/2014   MCV 90.0 08/23/2014   PLT 300 08/23/2014    Recent  Labs  08/23/14 2236  INR 1.20    Recent Labs Lab 08/23/14 1835  NA 141  K 4.1  CL 103  CO2 35*  BUN 18  CREATININE 0.87  CALCIUM 8.5  GLUCOSE 84   MAGNESIUM  Date Value Ref Range Status  07/28/2014 1.9 1.5 - 2.5 mg/dL Final     Recent Labs  08/23/14 2240  TROPIPOC 0.03    Lab Results  Component Value Date   CHOL 166 08/24/2014   HDL 45 08/24/2014   LDLCALC 83 08/24/2014   TRIG 190* 08/24/2014     Echo: Study Date: 07/27/2014 Study Conclusions - Left ventricle: Technically very difficult study. The EF is in   the 50% range. There is hypokinesis of the inferior wall. There   is question on one measurement that the LV is dilated. However,   in other views I do not see LV dilitation. Wall thickness was   increased in a pattern of moderate LVH. - Aortic valve: The commissures between right and noncoronary cusps   are calcified with reduced opening. The doppler data does not   show a significant gradient. Most likely this means that the left   cusp is opening adequately,even though it is not seen well. If a   clinical question about the possibility of significant AS still   exists, TEE would help assess the valve more. - Mitral valve: There was mild regurgitation. - Left atrium: The atrium was moderately dilated. - Right ventricle: Images of the RV are limited. I do not see the   pacemaker. The cavity size was normal. Systolic function was   normal.   ECG:  NSR HR 71. LAFB. Prolonged PR interval.   Radiology:  Ct Head Wo Contrast  08/25/2014   CLINICAL DATA:  Stroke, followup  EXAM: CT HEAD WITHOUT CONTRAST  TECHNIQUE: Contiguous axial images were obtained from the base of the skull through the vertex without intravenous contrast.  COMPARISON:  08/23/2014  FINDINGS:  Generalized atrophy.  Normal ventricular morphology.  No midline shift or mass effect.  Small vessel chronic ischemic changes of deep cerebral white matter.  Old infarct LEFT cerebellar hemisphere.   No intracranial hemorrhage, mass lesion or evidence acute infarction.  Question old small lacunar infarct at LEFT caudate.  Atherosclerotic calcifications in the internal carotid and vertebral arteries at skullbase.  Mucosal thickening with minimal fluid dependently in RIGHT maxillary sinus.  No acute osseous findings.  No extra-axial fluid collections.  IMPRESSION: Atrophy with small vessel chronic ischemic changes of deep cerebral white matter.  Old LEFT cerebellar and question old LEFT caudate infarct.  No acute intracranial abnormalities.   Electronically Signed   By: Ulyses Southward M.D.   On: 08/25/2014 07:28   Ct Head Wo Contrast  08/23/2014   CLINICAL DATA:  Left-sided weakness  EXAM: CT HEAD WITHOUT CONTRAST  TECHNIQUE: Contiguous axial images were obtained from the base of the skull through the vertex without intravenous contrast.  COMPARISON:  CT head 07/26/2014  FINDINGS: Moderate atrophy and moderate chronic microvascular ischemic change throughout the white matter.  Negative for acute infarct. Negative for hemorrhage or mass. No change from the prior CT.  Mucosal edema and small air-fluid level right maxillary sinus.  IMPRESSION: Atrophy and chronic microvascular ischemia. No acute intracranial abnormality.  Air-fluid level right maxillary sinus has developed since the prior CT.   Electronically Signed   By: Marlan Palau M.D.   On: 08/23/2014 19:04   Dg Chest Port 1 View  08/25/2014   CLINICAL DATA:  Cough.  EXAM: PORTABLE CHEST - 1 VIEW  COMPARISON:  07/28/2014  FINDINGS: Multi lead pacer apparatus, stable in position. Markedly low lung volumes. Stable enlarged cardiac and mediastinal contours with marked tortuosity of the thoracic aorta. Left-greater-than-right basilar heterogeneous pulmonary opacities. No definite pneumothorax.  IMPRESSION: Markedly low lung volumes. Left-greater-than-right basilar heterogeneous opacities favored to represent atelectasis. Infection not excluded.   Electronically  Signed   By: Annia Belt M.D.   On: 08/25/2014 12:40    ASSESSMENT AND PLAN:    Principal Problem:   Acute ischemic stroke Active Problems:   PACEMAKER-[employer]   Left-sided weakness  [name] is a 81 y.o. female with a history of HTN, tachy-brady syndrome s/p PPM (2011), CVA, CAD s/p BMS to RCA with ISR and overlapping DES to RCA in 2011, PAF not on AC due to falls, dementia, and unsteady gait (walks with walker) who was admitted to [facility] on 08/23/14 with left sided weakness. Patient felt to have a TIA and wants cardiology opinion on starting Eliquis for secondary stroke prevention.   PAF- with hx of CVA, previous TIAs and possible TIA this admission  -- Historically she has not been felt to be a candidate for long term AC due to high fall risk. Neurology recommended Ellquis for secondary stroke prevention and asked for cardiology input. Dr. Anne Fu had a long conversation with her son and her primary cardiologist, Dr. Tenny Craw. It was ultimately decided to try the patient on Eliquis knowing that there is bleeding risk if she were to fall. Son and Dr. Tenny Craw agree. She would qualify for normal dosing as her weight is >60 kg and creat <1.5   CAD- no CP or SOB -- Will discontinue ASA and Plavix in the setting of chronic anticoagulation.   Dispo- will be discharge to a SNF  Signed: Allena Katz 08/25/2014 3:10 PM  Pager [phone]   Personally seen and examined. Agree with above.  No CP, no SOB.  Talked with son on phone as well as Dr. Tenny Craw, primary cardiologist.  Since no recent falls in over one year, it is not unreasonable to try Eliquis 5 BID with understanding of bleeding risks. Son understands. She had 60 mode switches in 2015 pacer check with longest episode 2.5hrs. She definitely has PAF. If bleeding or fall occurs, we can always revert back to ASA or ASA/Plavix combo as she is on now. Of course, there is an inherit bleeding risk with this as well.   Donato Schultz, MD

## 2014-08-25 NOTE — Progress Notes (Signed)
Patient returned to unit from CT Scan.

## 2014-08-26 ENCOUNTER — Telehealth: Payer: Self-pay

## 2014-08-26 DIAGNOSIS — M79609 Pain in unspecified limb: Secondary | ICD-10-CM

## 2014-08-26 DIAGNOSIS — G459 Transient cerebral ischemic attack, unspecified: Principal | ICD-10-CM

## 2014-08-26 DIAGNOSIS — I10 Essential (primary) hypertension: Secondary | ICD-10-CM

## 2014-08-26 DIAGNOSIS — E785 Hyperlipidemia, unspecified: Secondary | ICD-10-CM

## 2014-08-26 MED ORDER — APIXABAN 5 MG PO TABS: 5.0000 mg | ORAL_TABLET | Freq: Two times a day (BID) | ORAL | Status: DC

## 2014-08-26 NOTE — Clinical Social Work Note (Signed)
CSW spoke with pt's son Ed. CSW informed Ed that the pt will be discharge back to [facility] today. CSW and Ed discussed ambulance transport. CSW contact [facility] at [phone] to schedule transport for the pt. CSW informed Tresa Endo at [facility] regarding the pt return. CSW upload the pt's discharge summary. Bedside RN provided the number to call report.   Amany Rando, MSW, LCSWA [phone]

## 2014-08-26 NOTE — Discharge Instructions (Signed)

## 2014-08-26 NOTE — Telephone Encounter (Signed)
Please follow up with pt/family appears they are being discharged to a SNF, please confirm this if not they will need a follow up appt in office     If pt in SNF is this for short term rehab or long term?   Thanks    ----- Message -----    From: Vassie Loll, MD    Sent: 08/26/2014  2:19 PM     To: [name], NP   Left message on voicemail for patient's daughter to return call when available

## 2014-08-26 NOTE — Progress Notes (Signed)
PT Cancellation Note  Patient Details Name: [name] MRN: [id number] DOB: 1932-04-22   Cancelled Treatment:    Reason Eval/Treat Not Completed: Patient not medically ready. Noted order for bil LE dopplers to rule out DVTs (pt with reported leg pain). Will await doppler results before proceeding with further PT.   Lainey Nelson 08/26/2014, 8:24 AM Pager [phone]

## 2014-08-26 NOTE — Progress Notes (Signed)
*  Preliminary Results* Bilateral lower extremity venous duplex completed. Study was technically limited due to swelling and patient position. Visualized veins of bilateral lower extremities are negative for deep vein thrombosis. There is no evidence of Baker's cyst bilaterally.  08/26/2014  Gertie Fey, RVT, RDCS, RDMS

## 2014-08-26 NOTE — Progress Notes (Signed)
Report given to Sam, Charity fundraiser at [place]. Will monitor for pt's departure by ambulance.   Andrew Au I 08/26/2014 3:23 PM

## 2014-08-26 NOTE — Progress Notes (Signed)
Attempted to give report to Environmental education officer at [facility]. No answer, left message. Will monitor for return call.  Estimated time of departure with ambulance 1530. Will monitor   Andrew Au I 08/26/2014 3:09 PM

## 2014-08-26 NOTE — Evaluation (Signed)
Speech Language Pathology Evaluation Patient Details Name: [name] MRN: [id number] DOB: 03-Jan-1932 Today's Date: 08/26/2014 Time: 4818-5631 SLP Time Calculation (min) (ACUTE ONLY): 12 min  Problem List:  Patient Active Problem List   Diagnosis Date Noted  . TIA (transient ischemic attack)   . HLD (hyperlipidemia)   . Left-sided weakness 08/23/2014  . Acute ischemic stroke 08/23/2014  . Dehydration 07/27/2014  . Orthostatic hypotension 07/26/2014  . Chronic diastolic CHF (congestive heart failure) 07/26/2014  . Vertiginous syndrome   . Weakness 07/25/2014  . Chronic diastolic heart failure 07/14/2014  . Hyperlipidemia 07/14/2014  . Prediabetes 07/14/2014  . Dizziness 04/30/2013  . Need for prophylactic vaccination and inoculation against influenza 04/02/2013  . Depression 04/02/2013  . Acute renal failure 04/02/2013  . Hyperthyroidism 04/02/2013  . Abnormality of gait 02/14/2013  . History of CVA (cerebrovascular accident) 02/14/2013  . Generalized weakness 11/24/2012  . Atrial fibrillation 10/11/2012  . Sinoatrial node dysfunction 10/03/2011  . Thyrotoxicosis 05/20/2010  . HYPERCHOLESTEROLEMIA 05/20/2010  . DEPRESSION 05/20/2010  . Essential hypertension 05/20/2010  . Coronary atherosclerosis 05/20/2010  . Congestive heart failure 05/20/2010  . Cerebral artery occlusion with cerebral infarction 05/20/2010  . BACK PAIN, LUMBAR 05/20/2010  . Osteoporosis 05/20/2010  . Memory loss 05/20/2010  . OTHER URINARY INCONTINENCE 05/20/2010  . PACEMAKER-[employer] 05/20/2010   Past Medical History:  Past Medical History  Diagnosis Date  . Lumbar back pain   . Other urinary incontinence   . Memory loss   . DJD (degenerative joint disease)   . Depression   . Hypertension   . CVA (cerebral infarction)   . CHF (congestive heart failure)     Chronic Diastolic  . CAD (coronary artery disease)   . Presence of permanent cardiac pacemaker   . Osteoporosis   .  Hypercholesterolemia   . Hyperthyroidism   . Anxiety   . Vertigo   . Tachy-brady syndrome   . Ischemic cardiomyopathy    Past Surgical History:  Past Surgical History  Procedure Laterality Date  . Pacemaker insertion  2011  . Total knee arthroplasty  2002    bilateral  . Abdominal hysterectomy  1986  . Bilateral stenting  1999    to the RCA  . Cardiac catheterization  05/23/10  . Total abdominal hysterectomy w/ bilateral salpingoophorectomy     HPI:  81 y.o. female admitted with left sided facial droop.PMH: TIA's, memory loss, HTN, CVA anxiety, vertigo. CT no acute intracranial abnormalities,  Old LEFT cerebellar and question old LEFT caudate infarct, atrophy with small chronic ischemic changes.   Assessment / Plan / Recommendation Clinical Impression  Pt with history of dementia, underwent speech-cognitive assessment 2013, no therapy recommended at that time. Husband present during assessment and reports memory is poor and pt is assisted with ADL's. He states deficits have not worsened and appear to be baseline. His son and daughter-in-law assist with pt's needs. Husband reports pt spends most of her day in bed and in chair. No ST needed at this time.     SLP Assessment  Patient does not need any further Speech Lanaguage Pathology Services    Follow Up Recommendations       Frequency and Duration        Pertinent Vitals/Pain Pain Assessment: No/denies pain   SLP Goals     SLP Evaluation Prior Functioning  Cognitive/Linguistic Baseline: Baseline deficits Baseline deficit details:  (poor memory) Type of Home: Apartment  Lives With: Spouse Available Help at Discharge: Family;Available PRN/intermittently  Cognition  Overall Cognitive Status: History of cognitive impairments - at baseline Orientation Level: Oriented to person    Comprehension  Auditory Comprehension Overall Auditory Comprehension: Appears within functional limits for tasks assessed (for basic  info) Commands: Within Functional Limits (simple one step) Visual Recognition/Discrimination Discrimination: Not tested Reading Comprehension Reading Status: Not tested    Expression Expression Primary Mode of Expression: Verbal Verbal Expression Overall Verbal Expression: Appears within functional limits for tasks assessed Pragmatics: No impairment Written Expression Dominant Hand: Right Written Expression: Not tested   Oral / Motor Oral Motor/Sensory Function Overall Oral Motor/Sensory Function: Impaired at baseline (appears to have left labial weakness at rest) Motor Speech Overall Motor Speech: Appears within functional limits for tasks assessed Motor Planning: Witnin functional limits   GO     Royce Macadamia 08/26/2014, 3:13 PM Breck Coons Alyan Hartline M.Ed [employer] [phone]

## 2014-08-26 NOTE — Progress Notes (Signed)
Nurse was notified by central tele monitoring that pt had 19 beat of vtach. Pt asymptomatic. Nurse notified MD, Dr. Gwenlyn Perking. No new orders.   Will continue to monitor pt closely   Andrew Au I 08/26/2014 9:55 AM

## 2014-08-26 NOTE — Discharge Summary (Signed)
Physician Discharge Summary  [name] ZOX:[id number] DOB: 07-Apr-1932 DOA: 08/23/2014  PCP: [name], NP  Admit date: 08/23/2014 Discharge date: 08/26/2014  Time spent:>30 minutes  Recommendations for Outpatient Follow-up:  Repeat CBC in 1 week to follow Hgb trend Check BMET in 1 week to follow renal function and electrolytes Patient now started on Eliquis, follow closely for any signs of bleeding   Discharge Diagnoses:  TIA Left-sided weakness Essential HTN HLD PAF s/p PACEMAKER-[employer] GERD Hyperthyroidism   Discharge Condition: stable and improved. Will discharge back to SNF for rehab and further treatment.  Diet recommendation: heart healthy diet  Filed Weights   08/24/14 0700  Weight: 78.5 kg (173 lb 1 oz)    History of present illness:  81 y.o. female brought to the ED for evaluation for left sided facial droop. Patient was with her family on a walk when she developed weakness of her left side including left facial droop. Per their report patient has frequent TIAs in the past, always has left sided weakness with these. This time however symptoms of facial droop have persisted. Patient was brought to the ED where she continues to have L facial droop, some slurring of speech due to facial droop but no difficulty with word finding.  Hospital Course:  1-TIA/left facial droop: resolved -patient work up negative for acute ischemic process -symptoms has now resolved -will discharge back to SNF for rehab -continue statins, BP control and now eliquis for secondary prevention  2-PAF: rate controlled -on eliquis now for secondary prevention -CHADsVasc score: 5  3-HLD: continue statins  4-HTN: stable and well controlled. -continue current medications and heart healthy diet  5-GERD: continue PPI  6-hyperthyroidism: continue methimazole  7-hx of constipation: continue miralax.  Procedures: -carotid dopplers: no significant stenosis -2-D echo: no  source of embolus identified -CT scan of head (X 2 and 48 hours apart): no acute ischemic abnormality  Consultations: Cardiology Neurology  Discharge Exam: Filed Vitals:   08/26/14 1316  BP: 136/76  Pulse: 68  Temp: 98.1 F (36.7 C)  Resp: 20    General: AAOX3, no acute distress; still with mild facial droop (according to patient's husband, this her baseline residual deficit from previous stroke). No fever. Denies CP or SOB. Cardiovascular: rate controlled, no rubs or gallops; no murmurs  Respiratory: CTA bilaterally Abd: soft, NT, ND, positive BS Extremities: trace edema bilaterally, no pain, no clubbing  Neuro: no new focal deficit appreciated  Discharge Instructions   Discharge Instructions    Ambulatory referral to Neurology    Complete by:  As directed   Please schedule post stroke follow up in 2 months.     Diet - low sodium heart healthy    Complete by:  As directed      Diet - low sodium heart healthy    Complete by:  As directed      Discharge instructions    Complete by:  As directed   Follow heart healthy diet Take medications as prescribed Follow up with Dr. Pearlean Brownie in 1-2 months Follow up with PCP in 2 weeks Watch carefully for any signs of bleeding (as patient started on Eliquis) Very carefull with mobility to avoid falls (now on anticoagulation)          Current Discharge Medication List    START taking these medications   Details  apixaban (ELIQUIS) 5 MG TABS tablet Take 1 tablet (5 mg total) by mouth 2 (two) times daily. Qty: 60 tablet  pantoprazole (PROTONIX) 40 MG tablet Take 1 tablet (40 mg total) by mouth daily at 6 (six) AM.      CONTINUE these medications which have NOT CHANGED   Details  alendronate (FOSAMAX) 70 MG tablet Take 1 tablet (70 mg total) by mouth once a week. Take with a full glass of water on an empty stomach. Qty: 12 tablet, Refills: 3    Ascorbic Acid (VITAMIN C) 500 MG tablet Take 500 mg by mouth daily.       Calcium Carbonate-Vit D-Min 600-400 MG-UNIT TABS Take 1 tablet by mouth 2 (two) times daily.     lisinopril (PRINIVIL,ZESTRIL) 10 MG tablet Take 0.5 tablets (5 mg total) by mouth daily. Qty: 30 tablet, Refills: 11    methimazole (TAPAZOLE) 5 MG tablet Take one tablet by mouth three days a week as prescribed by endocrinology Qty: 12 tablet, Refills: 3    Multiple Vitamins-Minerals (CENTRUM SILVER) tablet Take 1 tablet by mouth daily.      polyethylene glycol (MIRALAX / GLYCOLAX) packet Take 17 g by mouth daily as needed for moderate constipation.    Propylene Glycol (SYSTANE BALANCE) 0.6 % SOLN Apply 1 drop to eye 2 (two) times daily. Qty: 1 Bottle, Refills: 3    simvastatin (ZOCOR) 40 MG tablet Take 1 tablet (40 mg total) by mouth daily at 6 PM. Qty: 30 tablet, Refills: 11    nitroGLYCERIN (NITROSTAT) 0.4 MG SL tablet Place 1 tablet (0.4 mg total) under the tongue every 5 (five) minutes as needed. For chest pain Qty: 30 tablet, Refills: 0      STOP taking these medications     aspirin ([employer] ASPIRIN LOW DOSE) 81 MG EC tablet      clopidogrel (PLAVIX) 75 MG tablet      polyethylene glycol powder (GLYCOLAX/MIRALAX) powder        Allergies  Allergen Reactions  . Iodine Other (See Comments)    unknown  . Iohexol Hives     Code: HIVES, Desc: hives with nonionic contrast 7 yrs ago ([place] iv benedryl given   . Peanut-Containing Drug Products Other (See Comments)    unknown   Follow-up Information    Follow up with SETHI,PRAMOD, MD In 2 months.   Specialties:  Neurology, Radiology   Why:  Stroke Clinic, Office will call you with appointment date & time   Contact information:   [address] [place] [address] [phone]       Follow up with Janyth Contes, JESSICA K, NP In 2 weeks.   Specialty:  Nurse Practitioner   Contact information:   [address] [place] [address] [phone]       The results of significant diagnostics from this  hospitalization (including imaging, microbiology, ancillary and laboratory) are listed below for reference.    Significant Diagnostic Studies: Ct Head Wo Contrast  08/25/2014   CLINICAL DATA:  Stroke, followup  EXAM: CT HEAD WITHOUT CONTRAST  TECHNIQUE: Contiguous axial images were obtained from the base of the skull through the vertex without intravenous contrast.  COMPARISON:  08/23/2014  FINDINGS: Generalized atrophy.  Normal ventricular morphology.  No midline shift or mass effect.  Small vessel chronic ischemic changes of deep cerebral white matter.  Old infarct LEFT cerebellar hemisphere.  No intracranial hemorrhage, mass lesion or evidence acute infarction.  Question old small lacunar infarct at LEFT caudate.  Atherosclerotic calcifications in the internal carotid and vertebral arteries at skullbase.  Mucosal thickening with minimal fluid dependently in RIGHT  maxillary sinus.  No acute osseous findings.  No extra-axial fluid collections.  IMPRESSION: Atrophy with small vessel chronic ischemic changes of deep cerebral white matter.  Old LEFT cerebellar and question old LEFT caudate infarct.  No acute intracranial abnormalities.   Electronically Signed   By: Ulyses Southward M.D.   On: 08/25/2014 07:28   Ct Head Wo Contrast  08/23/2014   CLINICAL DATA:  Left-sided weakness  EXAM: CT HEAD WITHOUT CONTRAST  TECHNIQUE: Contiguous axial images were obtained from the base of the skull through the vertex without intravenous contrast.  COMPARISON:  CT head 07/26/2014  FINDINGS: Moderate atrophy and moderate chronic microvascular ischemic change throughout the white matter.  Negative for acute infarct. Negative for hemorrhage or mass. No change from the prior CT.  Mucosal edema and small air-fluid level right maxillary sinus.  IMPRESSION: Atrophy and chronic microvascular ischemia. No acute intracranial abnormality.  Air-fluid level right maxillary sinus has developed since the prior CT.   Electronically Signed   By:  Marlan Palau M.D.   On: 08/23/2014 19:04   Dg Chest Port 1 View  08/25/2014   CLINICAL DATA:  Cough.  EXAM: PORTABLE CHEST - 1 VIEW  COMPARISON:  07/28/2014  FINDINGS: Multi lead pacer apparatus, stable in position. Markedly low lung volumes. Stable enlarged cardiac and mediastinal contours with marked tortuosity of the thoracic aorta. Left-greater-than-right basilar heterogeneous pulmonary opacities. No definite pneumothorax.  IMPRESSION: Markedly low lung volumes. Left-greater-than-right basilar heterogeneous opacities favored to represent atelectasis. Infection not excluded.   Electronically Signed   By: Annia Belt M.D.   On: 08/25/2014 12:40   Dg Chest Port 1 View  07/28/2014   CLINICAL DATA:  81 year old female with hypertension and history of congestive heart failure.  EXAM: PORTABLE CHEST - 1 VIEW  COMPARISON:  Prior chest x-ray 07/26/2014  FINDINGS: Stable cardiomegaly. Atherosclerotic and tortuous thoracic aorta. Left subclavian approach cardiac rhythm maintenance device. Leads project over the right atrium and right ventricle. No evidence of pulmonary edema, focal airspace consolidation, pleural effusion or pneumothorax. No acute osseous abnormality.  IMPRESSION: 1. No acute cardiopulmonary process. 2. Stable cardiomegaly.   Electronically Signed   By: Malachy Moan M.D.   On: 07/28/2014 09:19   Labs: Basic Metabolic Panel:  Recent Labs Lab 08/23/14 1835 08/25/14 1822  NA 141 141  K 4.1 4.1  CL 103 105  CO2 35* 24  GLUCOSE 84 146*  BUN 18 16  CREATININE 0.87 0.86  CALCIUM 8.5 9.1   Liver Function Tests:  Recent Labs Lab 08/25/14 1822  AST 16  ALT 14  ALKPHOS 47  BILITOT 0.5  PROT 6.2  ALBUMIN 3.0*    Recent Labs Lab 08/25/14 1822  LIPASE 36   CBC:  Recent Labs Lab 08/23/14 1835  WBC 9.8  NEUTROABS 4.5  HGB 13.3  HCT 40.5  MCV 90.0  PLT 300    Signed:  Vassie Loll  [facility] 08/26/2014, 2:03 PM

## 2014-08-26 NOTE — Progress Notes (Signed)
Pt transported out of unit per stretcher by ambulance personnel. Personal belongings with pt. No acute distress noted.   [name] I 08/26/2014 4:11 PM

## 2014-08-31 NOTE — Telephone Encounter (Signed)
Called pts daughter Jill Side) regarding a follow up appointment after her hospitalization.  The notes indicate patient have be admitted to a SNF.  Left message on daughters answering machine...cdavis

## 2014-09-02 ENCOUNTER — Telehealth: Payer: Self-pay | Admitting: Internal Medicine

## 2014-09-02 NOTE — Telephone Encounter (Signed)
New message      Pt c/o medication issue:  1. Name of Medication: HCTZ  2. How are you currently taking this medication (dosage and times per day)? 12.5 mg 3. Are you having a reaction (difficulty breathing--STAT)? Edema in face/legs and feet  4. What is your medication issue? Pt was recently in hosp---Dr Stoneking stopped spironolactone and prescribed HCTZ 12.5mg .  Son want to make sure it is ok with Dr Tenny Craw

## 2014-09-02 NOTE — Telephone Encounter (Signed)
Review of records, aldactone was discontinued in February because BP was low  Most recent admission she did not receive   HCTZ is OK  Will just need to keep close eye on BP and K  Make sure patient has f/u with me in a few wks

## 2014-09-02 NOTE — Telephone Encounter (Signed)
Son calling, who is her caregiver ,stating that when she was in hospital recently they stopped Spirolactone. Has been off Lasix about 1 month. She is currently at [facility] and Dr. Pete Glatter put her on HCTZ 12.5 mg daily 2 days ago.  She has been having some swelling in legs, face and feet.  Son states her BP has been good on HCTZ- 130/ .  Can't really tell if swelling is down since only been on medicine 2 days.  Son would like to know if Dr. Tenny Craw agrees that HCTZ is correct medicine for her instead of Spirolactone.  Would like a call back from Dr. Tenny Craw to make sure she is on correct medicine with all her history. Advised Dr. Tenny Craw not in office today but will forward message to her and her nurse Markus Jarvis for recommendations.

## 2014-09-03 NOTE — Telephone Encounter (Signed)
Spoke with patient's son. Informed that per Dr. Tenny Craw HCTZ is OK. Advised that her BP needs to be monitored, as well has her potassium. Per the hosp DC instructions, she is to due for labs.  Son is concerned with physicians other than Dr. Tenny Craw ordering medications. Advised he speak to the facility and with Dr.Stoneking, as he knows whats happening with his mom, and that Dr. Tenny Craw would like to see her in the office in a few weeks.  Son verbalizes understanding and does not want to make an appointment just yet, he is waiting to see if they will be taking her home in the next week or two.

## 2014-09-28 ENCOUNTER — Encounter: Payer: Self-pay | Admitting: Internal Medicine

## 2014-09-28 ENCOUNTER — Ambulatory Visit (INDEPENDENT_AMBULATORY_CARE_PROVIDER_SITE_OTHER): Payer: Medicare Other | Admitting: Internal Medicine

## 2014-09-28 VITALS — BP 144/94 | HR 58 | Ht 61.0 in | Wt 181.6 lb

## 2014-09-28 DIAGNOSIS — I639 Cerebral infarction, unspecified: Secondary | ICD-10-CM | POA: Diagnosis not present

## 2014-09-28 DIAGNOSIS — I4891 Unspecified atrial fibrillation: Secondary | ICD-10-CM

## 2014-09-28 LAB — BASIC METABOLIC PANEL
BUN: 17 mg/dL (ref 6–23)
CHLORIDE: 100 meq/L (ref 96–112)
CO2: 32 meq/L (ref 19–32)
CREATININE: 0.73 mg/dL (ref 0.40–1.20)
Calcium: 9.9 mg/dL (ref 8.4–10.5)
GFR: 81.43 mL/min (ref >60.00)
Glucose, Bld: 105 mg/dL — ABNORMAL HIGH (ref 70–99)
POTASSIUM: 3.6 meq/L (ref 3.5–5.1)
Sodium: 139 mEq/L (ref 135–145)

## 2014-09-28 LAB — CBC
HCT: 42.4 % (ref 36.0–46.0)
HEMOGLOBIN: 14.3 g/dL (ref 12.0–15.0)
MCHC: 33.7 g/dL (ref 30.0–36.0)
MCV: 86.8 fl (ref 78.0–100.0)
Platelets: 270 10*3/uL (ref 150.0–400.0)
RBC: 4.88 Mil/uL (ref 3.87–5.11)
RDW: 15.7 % — ABNORMAL HIGH (ref 11.5–15.5)
WBC: 7.1 10*3/uL (ref 4.0–10.5)

## 2014-09-28 MED ORDER — LISINOPRIL 5 MG PO TABS: 5.0000 mg | ORAL_TABLET | Freq: Every day | ORAL | Status: DC

## 2014-09-28 MED ORDER — HYDROCHLOROTHIAZIDE 12.5 MG PO CAPS: 12.5000 mg | ORAL_CAPSULE | Freq: Every day | ORAL | Status: DC

## 2014-09-28 MED ORDER — PANTOPRAZOLE SODIUM 40 MG PO TBEC: 40.0000 mg | DELAYED_RELEASE_TABLET | Freq: Every day | ORAL | Status: DC

## 2014-09-28 MED ORDER — APIXABAN 5 MG PO TABS: 5.0000 mg | ORAL_TABLET | Freq: Two times a day (BID) | ORAL | Status: AC

## 2014-09-28 NOTE — Patient Instructions (Signed)
Your physician recommends that you continue on your current medications as directed. Please refer to the Current Medication list given to you today.  Your physician recommends that you return for lab work today (CBC, BMET)   Your physician recommends that you schedule a follow-up appointment in: 3 MONTHS WITH DR ROSS.

## 2014-09-28 NOTE — Progress Notes (Signed)
Cardiology Office Note   Date:  09/29/2014   ID:  [name], DOB 23-Jan-1932, MRN [id number]  PCP:  [name], NP  Cardiologist:   Dietrich Pates, MD    History of Present Illness: [name] is a 81 y.o. female with a history of stroke, symptomatic bradycardia, paroxysmal atrial fibrillation, status post pacemaker insertion.   She was admitted in Early march for facial droop  Felt to have a TIA  Cardiology saw  Determined with lack of falls in past ear would try Eliquis  I discussed this with Michele Rockers Since d/c family says she feels a litle woozy all the time  Drinks a little  Urinates OK Seen by H Stoneking in [facility]  Placd on HCTZ 12.5 mg   Denies CP  No falls        Current Outpatient Prescriptions  Medication Sig Dispense Refill  . alendronate (FOSAMAX) 70 MG tablet Take 1 tablet (70 mg total) by mouth once a week. Take with a full glass of water on an empty stomach. 12 tablet 3  . apixaban (ELIQUIS) 5 MG TABS tablet Take 1 tablet (5 mg total) by mouth 2 (two) times daily. 60 tablet 11  . Ascorbic Acid (VITAMIN C) 500 MG tablet Take 500 mg by mouth daily.      . Calcium Carbonate-Vit D-Min 600-400 MG-UNIT TABS Take 1 tablet by mouth 2 (two) times daily.     . hydrochlorothiazide (MICROZIDE) 12.5 MG capsule Take 1 capsule (12.5 mg total) by mouth daily. 30 capsule 11  . lisinopril (PRINIVIL,ZESTRIL) 5 MG tablet Take 1 tablet (5 mg total) by mouth daily. 30 tablet 11  . methimazole (TAPAZOLE) 5 MG tablet Take one tablet by mouth three days a week as prescribed by endocrinology 12 tablet 3  . Multiple Vitamins-Minerals (CENTRUM SILVER) tablet Take 1 tablet by mouth daily.      . nitroGLYCERIN (NITROSTAT) 0.4 MG SL tablet Place 1 tablet (0.4 mg total) under the tongue every 5 (five) minutes as needed. For chest pain 30 tablet 0  . pantoprazole (PROTONIX) 40 MG tablet Take 1 tablet (40 mg total) by mouth daily at 6 (six) AM. 30 tablet 11  . polyethylene glycol  (MIRALAX / GLYCOLAX) packet Take 17 g by mouth daily as needed for moderate constipation.    Marland Kitchen Propylene Glycol (SYSTANE BALANCE) 0.6 % SOLN Apply 1 drop to eye 2 (two) times daily. 1 Bottle 3  . simvastatin (ZOCOR) 40 MG tablet Take 1 tablet (40 mg total) by mouth daily at 6 PM. 30 tablet 11   No current facility-administered medications for this visit.    Allergies:   Iodine; Iohexol; and Peanut-containing drug products   Past Medical History  Diagnosis Date  . Lumbar back pain   . Other urinary incontinence   . Memory loss   . DJD (degenerative joint disease)   . Depression   . Hypertension   . CVA (cerebral infarction)   . CHF (congestive heart failure)     Chronic Diastolic  . CAD (coronary artery disease)   . Presence of permanent cardiac pacemaker   . Osteoporosis   . Hypercholesterolemia   . Hyperthyroidism   . Anxiety   . Vertigo   . Tachy-brady syndrome   . Ischemic cardiomyopathy     Past Surgical History  Procedure Laterality Date  . Pacemaker insertion  2011  . Total knee arthroplasty  2002    bilateral  . Abdominal hysterectomy  1986  . Bilateral stenting  1999    to the RCA  . Cardiac catheterization  05/23/10  . Total abdominal hysterectomy w/ bilateral salpingoophorectomy       Social History:  The patient  reports that she has never smoked. She does not have any smokeless tobacco history on file. She reports that she does not drink alcohol or use illicit drugs.   Family History:  The patient's family history includes Arthritis in her other; Coronary artery disease (age of onset: 76) in her father; Coronary artery disease (age of onset: 37) in her mother; Diabetes in her other; Hypertension in her other; Thyroid disease in her daughter.    ROS:  Please see the history of present illness. All other systems are reviewed and  Negative to the above problem except as noted.    PHYSICAL EXAM: VS:  BP 144/94 mmHg  Pulse 58  Ht 5\' 1"  (1.549 m)  Wt 181  lb 9.6 oz (82.373 kg)  BMI 34.33 kg/m2  GEN: Well nourished, well developed, in no acute distress HEENT: normal Neck: no JVD, carotid bruits, or masses Cardiac: RRR; no murmurs, rubs, or gallops,no edema  Respiratory:  clear to auscultation bilaterally, normal work of breathing GI: soft, nontender, nondistended, + BS  No hepatomegaly  MS: no deformity Moving all extremities   Skin: warm and dry, no rash Neuro:  Strength and sensation are intact Psych: euthymic mood, full affect   EKG:  EKG is not ordered today.   Lipid Panel    Component Value Date/Time   CHOL 166 08/24/2014 0705   CHOL 165 07/14/2014 1559   TRIG 190* 08/24/2014 0705   TRIG 129 10/06/2009   HDL 45 08/24/2014 0705   HDL 72 07/14/2014 1559   CHOLHDL 3.7 08/24/2014 0705   CHOLHDL 2.3 07/14/2014 1559   VLDL 38 08/24/2014 0705   LDLCALC 83 08/24/2014 0705   LDLCALC 59 07/14/2014 1559      Wt Readings from Last 3 Encounters:  09/28/14 181 lb 9.6 oz (82.373 kg)  07/30/14 183 lb 10.3 oz (83.3 kg)  07/14/14 180 lb 8 oz (81.874 kg)      ASSESSMENT AND PLAN:  1.  Atrial fibrillation.  Recent admit with CVA  Now on anticoagulation.  I would keep on same regimen   Will check CBC and BMET    2.  HTN  Will folllow  I would not change for now.    F/U in 3 months   Orders Placed This Encounter  Procedures  . Basic Metabolic Panel (BMET)  . CBC     Signed, Dietrich Pates, MD  09/29/2014 8:56 AM    [facility] [address] [place], [place], [place]  [address] Phone: [phone]; Fax: [phone]

## 2014-10-08 ENCOUNTER — Ambulatory Visit (INDEPENDENT_AMBULATORY_CARE_PROVIDER_SITE_OTHER): Payer: Medicare Other | Admitting: Nurse Practitioner

## 2014-10-08 ENCOUNTER — Encounter: Payer: Self-pay | Admitting: Nurse Practitioner

## 2014-10-08 VITALS — BP 122/70 | HR 79 | Temp 98.3°F | Ht 61.0 in | Wt 181.0 lb

## 2014-10-08 DIAGNOSIS — G459 Transient cerebral ischemic attack, unspecified: Secondary | ICD-10-CM | POA: Diagnosis not present

## 2014-10-08 DIAGNOSIS — E785 Hyperlipidemia, unspecified: Secondary | ICD-10-CM

## 2014-10-08 DIAGNOSIS — R269 Unspecified abnormalities of gait and mobility: Secondary | ICD-10-CM

## 2014-10-08 DIAGNOSIS — I4891 Unspecified atrial fibrillation: Secondary | ICD-10-CM

## 2014-10-08 DIAGNOSIS — I639 Cerebral infarction, unspecified: Secondary | ICD-10-CM

## 2014-10-08 DIAGNOSIS — F0150 Vascular dementia without behavioral disturbance: Secondary | ICD-10-CM

## 2014-10-08 DIAGNOSIS — E0590 Thyrotoxicosis, unspecified without thyrotoxic crisis or storm: Secondary | ICD-10-CM | POA: Diagnosis not present

## 2014-10-08 MED ORDER — ALENDRONATE SODIUM 70 MG PO TABS: 70.0000 mg | ORAL_TABLET | ORAL | Status: DC

## 2014-10-08 MED ORDER — MEMANTINE HCL ER 28 MG PO CP24: 28.0000 mg | ORAL_CAPSULE | Freq: Every day | ORAL | Status: DC

## 2014-10-08 MED ORDER — METHIMAZOLE 5 MG PO TABS: ORAL_TABLET | ORAL | Status: DC

## 2014-10-08 NOTE — Patient Instructions (Addendum)
Start namenda titration per package instruction   Keep next appt

## 2014-10-08 NOTE — Progress Notes (Signed)
Patient ID: [name], female   DOB: 08/23/1931, 81 y.o.   MRN: [id number]    PCP: [name], NP  Allergies  Allergen Reactions  . Iodine Other (See Comments)    unknown  . Iohexol Hives     Code: HIVES, Desc: hives with nonionic contrast 7 yrs ago ([place] iv benedryl given   . Peanut-Containing Drug Products Other (See Comments)    unknown    Chief Complaint  Patient presents with  . Hospitalization Follow-up    Seen in hospital on 08/23/14, physically patient is a little better, mentally patient is not much better. Discharged from rehab 09/11/14     HPI: Patient is a 81 y.o. female seen in the office today to follow up. Pt here with husband and daughter.  Daughter speaking for pt. Went to hospital after she was unable to get oob. Questioned CVA but daughter reports it was hard to tell if there was a new stroke on scan. Pt was dc'd from hospital to SNF for rehab, on March 6th was brought back to ED due to facial droop and was dx with TIA, work up was negative for acute ischemic event. She was then discharged back to SNF because of difficulty walking. She was discharged from [facility] march 26th and now is back [facility]. Needing help with showering and dressing. Still unable to shower and dress without help.  During hospitalization eliquis was started for prevention.  Has followed up with cardiology.  Pt also with dementia, Daughter interested in medications to slow the progression of her dementia.   Review of Systems:  Review of Systems  Constitutional: Positive for fatigue. Negative for fever and chills.  HENT: Negative for congestion, mouth sores and sore throat.   Eyes: Negative for visual disturbance.  Respiratory: Negative for cough, chest tightness and shortness of breath.   Cardiovascular: Negative for chest pain, palpitations and leg swelling.  Gastrointestinal: Negative for nausea, vomiting, abdominal pain and constipation.    Genitourinary: Negative for dysuria, hematuria and vaginal discharge.  Musculoskeletal: Positive for gait problem.       Uses walker. Denies any falls/ trauma  Skin: Negative for rash.  Neurological: Positive for dizziness and headaches. Negative for tremors, syncope and light-headedness.       Occasional headaches and dizziness-- feels "off" hx of dementia   Hematological: Negative for adenopathy.  Psychiatric/Behavioral: Negative for confusion, sleep disturbance and agitation. The patient is not nervous/anxious.        Short term memory loss    Past Medical History  Diagnosis Date  . Lumbar back pain   . Other urinary incontinence   . Memory loss   . DJD (degenerative joint disease)   . Depression   . Hypertension   . CVA (cerebral infarction)   . CHF (congestive heart failure)     Chronic Diastolic  . CAD (coronary artery disease)   . Presence of permanent cardiac pacemaker   . Osteoporosis   . Hypercholesterolemia   . Hyperthyroidism   . Anxiety   . Vertigo   . Tachy-brady syndrome   . Ischemic cardiomyopathy    Past Surgical History  Procedure Laterality Date  . Pacemaker insertion  2011  . Total knee arthroplasty  2002    bilateral  . Abdominal hysterectomy  1986  . Bilateral stenting  1999    to the RCA  . Cardiac catheterization  05/23/10  . Total abdominal hysterectomy w/ bilateral salpingoophorectomy  Social History:   reports that she has never smoked. She does not have any smokeless tobacco history on file. She reports that she does not drink alcohol or use illicit drugs.  Family History  Problem Relation Age of Onset  . Thyroid disease Daughter   . Diabetes Other   . Hypertension Other   . Arthritis Other   . Coronary artery disease Mother 4  . Coronary artery disease Father 98    Medications: Patient's Medications  New Prescriptions   No medications on file  Previous Medications   ALENDRONATE (FOSAMAX) 70 MG TABLET    Take 1 tablet (70  mg total) by mouth once a week. Take with a full glass of water on an empty stomach.   APIXABAN (ELIQUIS) 5 MG TABS TABLET    Take 1 tablet (5 mg total) by mouth 2 (two) times daily.   ASCORBIC ACID (VITAMIN C) 500 MG TABLET    Take 500 mg by mouth daily.     CALCIUM CARBONATE-VIT D-MIN 600-400 MG-UNIT TABS    Take 1 tablet by mouth 2 (two) times daily.    HYDROCHLOROTHIAZIDE (MICROZIDE) 12.5 MG CAPSULE    Take 1 capsule (12.5 mg total) by mouth daily.   LISINOPRIL (PRINIVIL,ZESTRIL) 5 MG TABLET    Take 1 tablet (5 mg total) by mouth daily.   METHIMAZOLE (TAPAZOLE) 5 MG TABLET    Take one tablet by mouth three days a week as prescribed by endocrinology   MULTIPLE VITAMINS-MINERALS (CENTRUM SILVER) TABLET    Take 1 tablet by mouth daily.     NITROGLYCERIN (NITROSTAT) 0.4 MG SL TABLET    Place 1 tablet (0.4 mg total) under the tongue every 5 (five) minutes as needed. For chest pain   PANTOPRAZOLE (PROTONIX) 40 MG TABLET    Take 1 tablet (40 mg total) by mouth daily at 6 (six) AM.   POLYETHYLENE GLYCOL (MIRALAX / GLYCOLAX) PACKET    Take 17 g by mouth daily as needed for moderate constipation.   PROPYLENE GLYCOL (SYSTANE BALANCE) 0.6 % SOLN    Apply 1 drop to eye 2 (two) times daily.   SIMVASTATIN (ZOCOR) 40 MG TABLET    Take 1 tablet (40 mg total) by mouth daily at 6 PM.  Modified Medications   No medications on file  Discontinued Medications   No medications on file     Physical Exam:  Filed Vitals:   10/08/14 1419  BP: 122/70  Pulse: 79  Temp: 98.3 F (36.8 C)  TempSrc: Oral  Height: 5\' 1"  (1.549 m)  Weight: 181 lb (82.101 kg)  SpO2: 96%    Physical Exam  Constitutional: She appears well-developed and well-nourished. No distress.  Eyes: Conjunctivae and EOM are normal. Pupils are equal, round, and reactive to light.  Neck: Normal range of motion. Neck supple.  Cardiovascular: Normal rate, regular rhythm and normal heart sounds.   Pulmonary/Chest: Effort normal and breath sounds  normal.  Abdominal: Soft. Bowel sounds are normal.  Musculoskeletal: She exhibits no edema or tenderness.  Neurological: She is alert.  Slowed gait, uses walker  Skin: Skin is warm and dry. She is not diaphoretic.  Psychiatric: She has a normal mood and affect.    Labs reviewed: Basic Metabolic Panel:  Recent Labs  80/88/11 1437 07/14/14 1559  07/25/14 0930  07/27/14 0632 07/28/14 0838 08/23/14 1835 08/25/14 1822 09/28/14 1001  NA 137 142  < >  --   < > 140 141 141 141 139  K  4.7 4.9  < >  --   < > 3.9 3.6 4.1 4.1 3.6  CL 97 99  < >  --   < > 105 105 103 105 100  CO2 26 27  < >  --   < > 31 29 35* 24 32  GLUCOSE 93 100*  < >  --   < > 111* 113* 84 146* 105*  BUN 24 18  < >  --   < > CREATININE 0.91 0.90  < >  --   < > 0.86 0.81 0.87 0.86 0.73  CALCIUM 10.1 9.9  < >  --   < > 9.5 9.4 8.5 9.1 9.9  MG  --   --   --   --   --  1.9 1.9  --   --   --   TSH 0.504 0.674  --  0.589  --   --   --   --   --   --   < > = values in this interval not displayed. Liver Function Tests:  Recent Labs  07/14/14 1559 07/25/14 0335 08/25/14 1822  AST ALT ALKPHOS 49 44 47  BILITOT 0.3 0.8 0.5  PROT 6.8 6.7 6.2  ALBUMIN  --  3.6 3.0*    Recent Labs  08/25/14 1822  LIPASE 36   No results for input(s): AMMONIA in the last 8760 hours. CBC:  Recent Labs  01/07/14 1437 07/14/14 1559  07/26/14 0601 08/23/14 1835 09/28/14 1001  WBC 9.7 9.6  < > 7.7 9.8 7.1  NEUTROABS 4.4 4.3  --   --  4.5  --   HGB 13.7 14.4  < > 12.8 13.3 14.3  HCT 41.6 44.0  < > 39.0 40.5 42.4  MCV 90 91  < > 91.1 90.0 86.8  PLT  --  241  < > 207 300 270.0  < > = values in this interval not displayed. Lipid Panel:  Recent Labs  01/07/14 1437 07/14/14 1559 08/24/14 0705  CHOL 169 165 166  HDL 61 72 45  LDLCALC 84 59 83  TRIG 119 169* 190*  CHOLHDL 2.8 2.3 3.7   TSH:  Recent Labs  01/07/14 1437 07/14/14 1559 07/25/14 0930  TSH 0.504 0.674 0.589    A1C: Lab Results  Component Value Date   HGBA1C 6.2* 08/24/2014     Assessment/Plan 1. Atrial fibrillation, unspecified -conts on eliquis due to multiple CVA and TIAs -rate controlled and pt with pacemaker, due to follow up on pacemaker, daughter to follow up on this.   2. Transient cerebral ischemia, unspecified transient cerebral ischemia type -conts on anticoagulation, bp control and statin -follow up scheduled with neurology -cont with [facility] PT/OT  3. Thyrotoxicosis without thyroid storm, unspecified thyrotoxicosis type TSH obtained in february, conts on methimazole   4. Abnormality of gait -ongoing work with therapy for showering and dressing.  -no falls since dc from rehab  5. Hyperlipidemia conts on zocor  6. Dementia - namenda titration pack given to pts daughter and educated on use. Pt also with follow up at neurologist office in June  To keep appt in July, follow up sooner if needed

## 2014-10-09 ENCOUNTER — Encounter: Payer: Self-pay | Admitting: *Deleted

## 2014-11-04 ENCOUNTER — Telehealth: Payer: Self-pay | Admitting: Internal Medicine

## 2014-11-04 NOTE — Telephone Encounter (Signed)
New Message       Physical therapist calling stating that pt's home physical therapy visits will be ending soon and wanting to recertify pt for another 60 days. Please call back and advise.

## 2014-11-05 NOTE — Telephone Encounter (Signed)
Additional PT therapy would be great  See if get approved.

## 2014-11-06 NOTE — Telephone Encounter (Signed)
Follow up       Calling to get a verbal to continue PT.  PT will end this week unless it is recertified.  Also, can pt have a home health aid?

## 2014-11-06 NOTE — Telephone Encounter (Signed)
I spoke with Ree Kida with [facility] and advised ok per Dr. Tenny Craw to continue PT and gave ok for [facility] aide as well.

## 2014-11-18 ENCOUNTER — Encounter: Payer: Self-pay | Admitting: Neurology

## 2014-11-18 ENCOUNTER — Ambulatory Visit (INDEPENDENT_AMBULATORY_CARE_PROVIDER_SITE_OTHER): Payer: Medicare Other | Admitting: Neurology

## 2014-11-18 VITALS — BP 132/82 | HR 84 | Wt 180.0 lb

## 2014-11-18 DIAGNOSIS — I639 Cerebral infarction, unspecified: Secondary | ICD-10-CM | POA: Insufficient documentation

## 2014-11-18 DIAGNOSIS — F329 Major depressive disorder, single episode, unspecified: Secondary | ICD-10-CM | POA: Diagnosis not present

## 2014-11-18 DIAGNOSIS — M6289 Other specified disorders of muscle: Secondary | ICD-10-CM

## 2014-11-18 DIAGNOSIS — F32A Depression, unspecified: Secondary | ICD-10-CM

## 2014-11-18 DIAGNOSIS — Z8673 Personal history of transient ischemic attack (TIA), and cerebral infarction without residual deficits: Secondary | ICD-10-CM

## 2014-11-18 DIAGNOSIS — I480 Paroxysmal atrial fibrillation: Secondary | ICD-10-CM | POA: Diagnosis not present

## 2014-11-18 DIAGNOSIS — R531 Weakness: Secondary | ICD-10-CM

## 2014-11-18 MED ORDER — SERTRALINE HCL 50 MG PO TABS: 50.0000 mg | ORAL_TABLET | Freq: Every day | ORAL | Status: AC

## 2014-11-18 NOTE — Patient Instructions (Addendum)
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 - encourage more active with PT/OT - check BP at home - Follow up with your primary care physician for stroke risk factor modification. Recommend maintain blood pressure goal <130/80, diabetes with hemoglobin A1c goal below 6.5% and lipids with LDL cholesterol goal below 70 mg/dL.  - follow up with cardiology - follow up in 4 months.

## 2014-11-18 NOTE — Progress Notes (Signed)
STROKE NEUROLOGY FOLLOW UP NOTE  NAME: [name] DOB: 1931/12/14  REASON FOR VISIT: stroke follow up HISTORY FROM: chart and son  Today we had the pleasure of seeing [name] in follow-up at our Neurology Clinic. Pt was accompanied by son, who also served as interpreter.  History Summary [name] is an 81 y.o. female with history of stroke in the past, chronic diastolic CHF, hyperthyroidism, sick sinus syndrome status post pacemaker placement and A. fib not on anticoagulation due to fall risk, CAD, and dyslipidemia was brought to the emergency department on 08/23/14 for evaluation of left-sided weakness and left facial droop. CT head imaging reviewed and overall unremarkable. Patient was not administered TPA secondary to resolution of symptoms. After admission, repeat CT no acute abnormalities. Stroke work up negative including 2D echo and CUS. LDL 83 on zocor 40mg  and A1C 6.2. Due to afib, she was put on eliquis. Continued on zocor. Pt was discharged in good condition.   She was admitted in 07/2014 for dizziness, presumebly due to orthostatic hypotension with dehydration and diuretic use. Significantly improved with IV fluids. CT head on 2/6 and on 2/7 negative. Seen by physical therapy, recommendations are discharged to SNF. Orthostatic vital signs have resolved by the day of discharge.    Interval History During the interval time, the patient has been doing fair.  Still not active on exercise at home. Not motivated for rehabilitation exercises. Patient stated that she feels depressed due to stroke, and she does not feel well to work with PT OT. Son stated that patient is upset about her daughter in new work not able to come down to [place] to help out.  REVIEW OF SYSTEMS: Full 14 system review of systems performed and notable only for those listed below and in HPI above, all others are negative:  Constitutional:   Cardiovascular:  Ear/Nose/Throat:   Skin:  Eyes:     Respiratory:   Gastroitestinal:   Genitourinary:  Hematology/Lymphatic:   Endocrine:  Musculoskeletal:   Allergy/Immunology:   Neurological:   Psychiatric:  Sleep:   The following represents the patient's updated allergies and side effects list: Allergies  Allergen Reactions  . Iodine Other (See Comments)    unknown  . Iohexol Hives     Code: HIVES, Desc: hives with nonionic contrast 7 yrs ago [place] iv benedryl given   . Peanut-Containing Drug Products Other (See Comments)    unknown    The neurologically relevant items on the patient's problem list were reviewed on today's visit.  Neurologic Examination  A problem focused neurological exam (12 or more points of the single system neurologic examination, vital signs counts as 1 point, cranial nerves count for 8 points) was performed.  Blood pressure 132/82, pulse 84, weight 180 lb (81.647 kg).  General - Well nourished, well developed, in no apparent distress.  Ophthalmologic - Fundi not visualized due to noncooperation.  Cardiovascular - Regular rate and rhythm.  Mental Status -  Level of arousal and orientation to place, and person were intact, however not orientated to time. Language including expression, naming, repetition, comprehension was difficult to due to language barrier but seems grossly intact.  Cranial Nerves II - XII - II - blinking to visual threat bilaterally. III, IV, VI - eyes attending to both directions. V - Facial sensation intact bilaterally. VII - left facial droop. VIII - Hearing & vestibular intact bilaterally. X - Palate elevates symmetrically. XI - Chin turning & shoulder shrug intact bilaterally.  XII - Tongue protrusion intact.  Motor Strength - The patient's strength was 4/5 LUE proximal and distal, decreased dexterity of left hand and pronator drift was present on the left. BLE 4/5 proximal and distal, RUE 5/5. Bulk was normal and fasciculations were absent.   Motor Tone - Muscle  tone was assessed at the neck and appendages and was normal.  Reflexes - The patient's reflexes were 1+ in all extremities and she had no pathological reflexes.  Sensory - Light touch, temperature/pinprick were assessed and were normal.    Coordination - The patient had normal movements in the right but slow on the left hand on FTN with dysmetria.  Tremor was absent.  Gait and Station - not tested, wheelchair-bound.  Data reviewed: I personally reviewed the images and agree with the radiology interpretations.  2D Echocardiogram EF 50% with no source of embolus.   Carotid Doppler There is 1-39% bilateral ICA stenosis. Vertebral artery flow is antegrade.   CT head - Atrophy with small vessel chronic ischemic changes of deep cerebralwhite matter. Old LEFT cerebellar and question old LEFT caudate infarct. No acute intracranial abnormalities  Component     Latest Ref Rng 08/24/2014  Cholesterol     0 - 200 mg/dL 161  Triglycerides     <150 mg/dL 096 (H)  HDL Cholesterol     >39 mg/dL 45  Total CHOL/HDL Ratio      3.7  VLDL     0 - 40 mg/dL 38  LDL (calc)     0 - 99 mg/dL 83  Hemoglobin E4V     4.8 - 5.6 % 6.2 (H)  Mean Plasma Glucose      131    Assessment: As you may recall, she is a 81 y.o. [place] female with PMH of stroke in the past, hyperthyroidism, chronic diastolic CHF, sick sinus syndrome status post pacemaker placement and A. fib not on anticoagulation due to fall risk, CAD, and dyslipidemia was brought to the emergency department on 08/23/14 for TIA episode. Patient was not administered TPA secondary to resolution of symptoms. Repeat CT no acute abnormalities. Stroke work up negative including 2D echo and CUS. LDL 83 on zocor  and A1C 6.2. Due to afib, she was put on eliquis. Continued on zocor. After discharge, patient was sent to SNF and currently at home. However, she was not active working with PT OT at home, and felt depressed. Will initiate SSRI.  Plan:  -  continue eliquis and statin for stroke prevention - will initiate zoloft for post stroke depression. - encourage more active with PT/OT - check BP at home - Follow up with your primary care physician for stroke risk factor modification. Recommend maintain blood pressure goal <130/80, diabetes with hemoglobin A1c goal below 6.5% and lipids with LDL cholesterol goal below 70 mg/dL.  - follow up with cardiology - RTC in 4 months.  No orders of the defined types were placed in this encounter.    Meds ordered this encounter  Medications  . sertraline (ZOLOFT) 50 MG tablet    Sig: Take 1 tablet (50 mg total) by mouth daily.    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 - encourage more active with PT/OT - check BP at home - Follow up with your primary care physician for stroke risk factor modification. Recommend maintain blood pressure  goal <130/80, diabetes with hemoglobin A1c goal below 6.5% and lipids with LDL cholesterol goal below 70 mg/dL.  - follow up with cardiology - follow up in 4 months.    Marvel Plan, MD PhD [facility] [address], [address] [place], [place] [address] [phone]

## 2014-11-20 ENCOUNTER — Encounter: Payer: Self-pay | Admitting: *Deleted

## 2014-11-26 ENCOUNTER — Encounter: Payer: Self-pay | Admitting: Internal Medicine

## 2014-11-26 ENCOUNTER — Ambulatory Visit (INDEPENDENT_AMBULATORY_CARE_PROVIDER_SITE_OTHER): Payer: Medicare Other | Admitting: *Deleted

## 2014-11-26 DIAGNOSIS — I495 Sick sinus syndrome: Secondary | ICD-10-CM | POA: Diagnosis not present

## 2014-11-27 NOTE — Progress Notes (Signed)
Remote pacemaker transmission.   

## 2014-12-03 LAB — CUP PACEART REMOTE DEVICE CHECK
Battery Remaining Longevity: 130 mo
Battery Voltage: 2.79 V
Brady Statistic AP VS Percent: 45 %
Lead Channel Impedance Value: 399 Ohm
Lead Channel Pacing Threshold Amplitude: 1.25 V
Lead Channel Pacing Threshold Pulse Width: 0.4 ms
Lead Channel Sensing Intrinsic Amplitude: 8 mV
Lead Channel Setting Pacing Amplitude: 2 V
Lead Channel Setting Pacing Amplitude: 2.5 V
Lead Channel Setting Pacing Pulse Width: 0.4 ms
MDC IDC MSMT BATTERY IMPEDANCE: 181 Ohm
MDC IDC MSMT LEADCHNL RA PACING THRESHOLD AMPLITUDE: 0.875 V
MDC IDC MSMT LEADCHNL RA PACING THRESHOLD PULSEWIDTH: 0.4 ms
MDC IDC MSMT LEADCHNL RA SENSING INTR AMPL: 2.8 mV
MDC IDC MSMT LEADCHNL RV IMPEDANCE VALUE: 497 Ohm
MDC IDC SESS DTM: 20160609215227
MDC IDC SET LEADCHNL RV SENSING SENSITIVITY: 2.8 mV
MDC IDC STAT BRADY AP VP PERCENT: 1 %
MDC IDC STAT BRADY AS VP PERCENT: 1 %
MDC IDC STAT BRADY AS VS PERCENT: 53 %

## 2014-12-07 ENCOUNTER — Encounter: Payer: Self-pay | Admitting: Cardiology

## 2014-12-22 ENCOUNTER — Encounter: Payer: Medicare Other | Admitting: Internal Medicine

## 2014-12-24 ENCOUNTER — Encounter: Payer: Self-pay | Admitting: Nurse Practitioner

## 2014-12-24 ENCOUNTER — Ambulatory Visit (INDEPENDENT_AMBULATORY_CARE_PROVIDER_SITE_OTHER): Payer: Medicare Other | Admitting: Nurse Practitioner

## 2014-12-24 VITALS — BP 132/84 | HR 85 | Temp 98.1°F | Resp 18 | Ht 61.0 in | Wt 184.0 lb

## 2014-12-24 DIAGNOSIS — F329 Major depressive disorder, single episode, unspecified: Secondary | ICD-10-CM | POA: Diagnosis not present

## 2014-12-24 DIAGNOSIS — I5032 Chronic diastolic (congestive) heart failure: Secondary | ICD-10-CM

## 2014-12-24 DIAGNOSIS — R7309 Other abnormal glucose: Secondary | ICD-10-CM

## 2014-12-24 DIAGNOSIS — F32A Depression, unspecified: Secondary | ICD-10-CM

## 2014-12-24 DIAGNOSIS — R7303 Prediabetes: Secondary | ICD-10-CM

## 2014-12-24 DIAGNOSIS — I10 Essential (primary) hypertension: Secondary | ICD-10-CM

## 2014-12-24 DIAGNOSIS — I480 Paroxysmal atrial fibrillation: Secondary | ICD-10-CM

## 2014-12-24 DIAGNOSIS — R413 Other amnesia: Secondary | ICD-10-CM | POA: Diagnosis not present

## 2014-12-24 DIAGNOSIS — E785 Hyperlipidemia, unspecified: Secondary | ICD-10-CM | POA: Diagnosis not present

## 2014-12-24 DIAGNOSIS — I639 Cerebral infarction, unspecified: Secondary | ICD-10-CM | POA: Diagnosis not present

## 2014-12-24 DIAGNOSIS — E0590 Thyrotoxicosis, unspecified without thyrotoxic crisis or storm: Secondary | ICD-10-CM | POA: Diagnosis not present

## 2014-12-24 DIAGNOSIS — Z8673 Personal history of transient ischemic attack (TIA), and cerebral infarction without residual deficits: Secondary | ICD-10-CM | POA: Diagnosis not present

## 2014-12-24 NOTE — Patient Instructions (Addendum)
Follow up in 4 months with blood work prior to visit   Elevate legs when sitting When sleeping elevate legs above the level of the heart May use compression hose

## 2014-12-24 NOTE — Progress Notes (Addendum)
Patient ID: [name], female   DOB: [birth year], 81 y.o.   MRN: [id number]    PCP: [name], NP  Allergies  Allergen Reactions  . Namenda [Memantine Hcl] Other (See Comments)    Dizzy  . Iodine Other (See Comments)    unknown  . Iohexol Hives     Code: HIVES, Desc: hives with nonionic contrast 7 yrs ago ([place] iv benedryl given   . Peanut-Containing Drug Products Other (See Comments)    unknown    Chief Complaint  Patient presents with  . Annual Exam    CPX, ekg completed 08/24/2014, labs completed. No pap. Here with son  . MMSE    Unable to complete due to lanaguage barrier      HPI: Patient is a 81 y.o. female seen in the office today for annual exam. Here with son. Pt with pmh of demenia, CAD, OP, htn, GERD, anxiety and depression, hyperlipidemia  Son notes anxiety with walking and fear of falling no recent falls. Unable to participate in MMSE Did not tolerate namenda, made her dizzy  Left sided weakness after CVA Participating in therapy due to generalized weakness and TIAs, previously in rehab at [facility], at home she does minimal activity per husband.   Specialist-  Following with cardiologist and neurologist   Screenings: Colon Cancer- last colonoscopy in 2003, family does not wish to have any more colonoscopy due to age and dementia Breast Cancer- aged out Cervical Cancer-aged out  Exercise regimen: works with therapy Diet: attempting low carb due to not being able to lose weight.   Functional Status of ADLs: Toileting- needs assistance, incontinent of bowel and bladder Bathing- completely dependant  Dressing-completely dependant   Son and husband help with care Does not wish her medications to be changed, cardiologist and neurologist mainly the ones making decisions regarding her care.   Advanced Directive information Does patient have an advance directive?: Yes Review of Systems:  Review of Systems  Constitutional: Positive for  fatigue. Negative for fever and chills.  HENT: Negative for congestion, mouth sores and sore throat.   Eyes: Negative for visual disturbance.  Respiratory: Negative for cough, chest tightness and shortness of breath.   Cardiovascular: Negative for chest pain, palpitations and leg swelling.  Gastrointestinal: Negative for nausea, vomiting, abdominal pain and constipation.  Genitourinary: Negative for dysuria, hematuria and vaginal discharge.       Incontinent of bowel and bladder at times  Musculoskeletal: Positive for gait problem.       Uses walker. Slow gait, needing assist   Skin: Negative for rash.  Neurological: Positive for weakness (minimal activity ). Negative for tremors, syncope and light-headedness.  Hematological: Negative for adenopathy.  Psychiatric/Behavioral: Positive for confusion. Negative for sleep disturbance and agitation. The patient is not nervous/anxious.        Short term memory loss    Past Medical History  Diagnosis Date  . Lumbar back pain   . Other urinary incontinence   . Memory loss   . DJD (degenerative joint disease)   . Depression   . Hypertension   . CVA (cerebral infarction)   . CHF (congestive heart failure)     Chronic Diastolic  . CAD (coronary artery disease)   . Presence of permanent cardiac pacemaker   . Osteoporosis   . Hypercholesterolemia   . Hyperthyroidism   . Anxiety   . Vertigo   . Tachy-brady syndrome   . Ischemic cardiomyopathy   . Stroke   .  TIA (transient ischemic attack)    Past Surgical History  Procedure Laterality Date  . Pacemaker insertion  2011  . Total knee arthroplasty  2002    bilateral  . Abdominal hysterectomy  1986  . Bilateral stenting  1999    to the RCA  . Cardiac catheterization  05/23/10  . Total abdominal hysterectomy w/ bilateral salpingoophorectomy     Social History:   reports that she has never smoked. She does not have any smokeless tobacco history on file. She reports that she does not  drink alcohol or use illicit drugs.  Family History  Problem Relation Age of Onset  . Thyroid disease Daughter   . Diabetes Other   . Hypertension Other   . Arthritis Other   . Coronary artery disease Mother 26  . Coronary artery disease Father 75    Medications: Patient's Medications  New Prescriptions   No medications on file  Previous Medications   ALENDRONATE (FOSAMAX) 70 MG TABLET    Take 1 tablet (70 mg total) by mouth once a week. Take with a full glass of water on an empty stomach.   APIXABAN (ELIQUIS) 5 MG TABS TABLET    Take 1 tablet (5 mg total) by mouth 2 (two) times daily.   ASCORBIC ACID (VITAMIN C) 500 MG TABLET    Take 500 mg by mouth daily.     CALCIUM CARBONATE-VIT D-MIN 600-400 MG-UNIT TABS    Take 1 tablet by mouth 2 (two) times daily.    HYDROCHLOROTHIAZIDE (MICROZIDE) 12.5 MG CAPSULE    Take 1 capsule (12.5 mg total) by mouth daily.   LISINOPRIL (PRINIVIL,ZESTRIL) 5 MG TABLET    Take 1 tablet (5 mg total) by mouth daily.   METHIMAZOLE (TAPAZOLE) 5 MG TABLET    Take one tablet by mouth three days a week as prescribed by endocrinology   MULTIPLE VITAMINS-MINERALS (CENTRUM SILVER) TABLET    Take 1 tablet by mouth daily.     NITROGLYCERIN (NITROSTAT) 0.4 MG SL TABLET    Place 1 tablet (0.4 mg total) under the tongue every 5 (five) minutes as needed. For chest pain   PANTOPRAZOLE (PROTONIX) 40 MG TABLET    Take 1 tablet (40 mg total) by mouth daily at 6 (six) AM.   POLYETHYLENE GLYCOL (MIRALAX / GLYCOLAX) PACKET    Take 17 g by mouth daily as needed for moderate constipation.   PROPYLENE GLYCOL (SYSTANE BALANCE) 0.6 % SOLN    Apply 1 drop to eye 2 (two) times daily.   SERTRALINE (ZOLOFT) 50 MG TABLET    Take 1 tablet (50 mg total) by mouth daily.   SIMVASTATIN (ZOCOR) 40 MG TABLET    Take 1 tablet (40 mg total) by mouth daily at 6 PM.  Modified Medications   No medications on file  Discontinued Medications   MEMANTINE (NAMENDA XR) 28 MG CP24 24 HR CAPSULE    Take 1  capsule (28 mg total) by mouth daily.     Physical Exam:  Filed Vitals:   12/24/14 1328  BP: 132/84  Pulse: 85  Temp: 98.1 F (36.7 C)  TempSrc: Oral  Resp: 18  Height: 5\' 1"  (1.549 m)  Weight: 184 lb (83.462 kg)  SpO2: 96%    Physical Exam  Constitutional: She appears well-developed and well-nourished. No distress.  HENT:  Head: Normocephalic and atraumatic.  Right Ear: External ear normal.  Left Ear: External ear normal.  Nose: Nose normal.  Mouth/Throat: Oropharynx is clear and moist. No  oropharyngeal exudate.  Eyes: Conjunctivae and EOM are normal. Pupils are equal, round, and reactive to light.  Neck: Normal range of motion. Neck supple.  Cardiovascular: Normal rate, regular rhythm and normal heart sounds.   Pulmonary/Chest: Effort normal and breath sounds normal. No respiratory distress. She has no wheezes.  Abdominal: Soft. Bowel sounds are normal. She exhibits no distension. There is no tenderness.  Musculoskeletal: She exhibits edema (2+ pitting bilaterally to knees). She exhibits no tenderness.  Neurological: She is alert.  Slowed gait, uses walker  Skin: Skin is warm and dry. She is not diaphoretic.  Psychiatric: She has a normal mood and affect.    Labs reviewed: Basic Metabolic Panel:  Recent Labs  81/19/14 1437 07/14/14 1559  07/25/14 0930  07/27/14 0632 07/28/14 0838 08/23/14 1835 08/25/14 1822 09/28/14 1001  NA 137 142  < >  --   < > 140 141 141 141 139  K 4.7 4.9  < >  --   < > 3.9 3.6 4.1 4.1 3.6  CL 97 99  < >  --   < > 105 105 103 105 100  CO2 26 27  < >  --   < > 31 29 35* 24 32  GLUCOSE 93 100*  < >  --   < > 111* 113* 84 146* 105*  BUN 24 18  < >  --   < > 17 13 18 16 17   CREATININE 0.91 0.90  < >  --   < > 0.86 0.81 0.87 0.86 0.73  CALCIUM 10.1 9.9  < >  --   < > 9.5 9.4 8.5 9.1 9.9  MG  --   --   --   --   --  1.9 1.9  --   --   --   TSH 0.504 0.674  --  0.589  --   --   --   --   --   --   < > = values in this interval not  displayed. Liver Function Tests:  Recent Labs  07/14/14 1559 07/25/14 0335 08/25/14 1822  AST 21 25 16   ALT 17 15 14   ALKPHOS 49 44 47  BILITOT 0.3 0.8 0.5  PROT 6.8 6.7 6.2  ALBUMIN  --  3.6 3.0*    Recent Labs  08/25/14 1822  LIPASE 36   No results for input(s): AMMONIA in the last 8760 hours. CBC:  Recent Labs  01/07/14 1437 07/14/14 1559  07/26/14 0601 08/23/14 1835 09/28/14 1001  WBC 9.7 9.6  < > 7.7 9.8 7.1  NEUTROABS 4.4 4.3  --   --  4.5  --   HGB 13.7 14.4  < > 12.8 13.3 14.3  HCT 41.6 44.0  < > 39.0 40.5 42.4  MCV 90 91  < > 91.1 90.0 86.8  PLT  --  241  < > 207 300 270.0  < > = values in this interval not displayed. Lipid Panel:  Recent Labs  01/07/14 1437 07/14/14 1559 08/24/14 0705  CHOL 169 165 166  HDL 61 72 45  LDLCALC 84 59 83  TRIG 119 169* 190*  CHOLHDL 2.8 2.3 3.7   TSH:  Recent Labs  01/07/14 1437 07/14/14 1559 07/25/14 0930  TSH 0.504 0.674 0.589   A1C: Lab Results  Component Value Date   HGBA1C 6.2* 08/24/2014     Assessment/Plan  1. Hyperthyroidism conts on tapazole  - TSH; Future  2. Prediabetes -diet modifications have been  made per son, will follow up A1c with next labs - Hemoglobin A1c; Future  3. Depression Appears to be stable on zoloft  4. History of CVA (cerebrovascular accident) Recent admission to hospital due to multiple TIAs, placed on eliquis due to a fib and TIA, conts to work with therapy due to overall weakness and deconditioning   5. Hyperlipidemia LDL at goal on current medication, conts zocor  6. Memory loss -following with neurology, did not tolerate namenda due to dizziness   7. Paroxysmal atrial fibrillation Rate controlled, conts on eliquis for stroke prevention  - CBC with Differential; Future  8. Chronic diastolic heart failure -pt taking HCTZ 12.5 daily along with lisinopril -pts with increased swelling and weight up 4 lbs in the last month, no increase in shortness of  breath, discussed concern with son due to heart failure -pts son would like cardiology only to manage cardiac medications therefore will not make changes but son will call cardiology office today for further recommendations -discussed low sodium diet and elevating LE when sitting but she does not wish to do this  9. Essential hypertension Blood pressure stable, conts on lisinopril and hctz -discussed low sodium diet, son reports a lot of the food she eats is high in sodium - Comprehensive metabolic panel; Future  10. Preventive Health Care Pt currently living at home with son and husband. Recent TIAs and memory loss pt has lost a lot of functional status over the last 6 months. She now requires assistance with ADLs. No further screening mammograms or colonoscopy per family -discussed DNR/DNI status but son reports they will decide that on a situational basis  - has living will, discussed bringing this in for [place] to have on file.   Follow up in 4 months with lab work prior to visit   45 mins Time TOTAL:  time greater than 50% of total time spent doing pt counseled and coordination of care regarding CHF, HTN, memory loss and discussing goals of care   Teyanna Thielman K. Biagio Borg  [facility] [phone] 8 am - 5 pm) [phone] (after hours)

## 2014-12-29 ENCOUNTER — Encounter (HOSPITAL_COMMUNITY): Payer: Self-pay

## 2014-12-29 ENCOUNTER — Inpatient Hospital Stay (HOSPITAL_COMMUNITY)
Admission: EM | Admit: 2014-12-29 | Discharge: 2015-01-07 | DRG: 287 | Disposition: A | Discharge status: Skilled Nursing Facility | Payer: Medicare Other | Attending: Internal Medicine | Admitting: Internal Medicine

## 2014-12-29 ENCOUNTER — Emergency Department (HOSPITAL_COMMUNITY): Payer: Medicare Other

## 2014-12-29 DIAGNOSIS — R7989 Other specified abnormal findings of blood chemistry: Secondary | ICD-10-CM

## 2014-12-29 DIAGNOSIS — Z950 Presence of cardiac pacemaker: Secondary | ICD-10-CM

## 2014-12-29 DIAGNOSIS — R609 Edema, unspecified: Secondary | ICD-10-CM | POA: Diagnosis not present

## 2014-12-29 DIAGNOSIS — I428 Other cardiomyopathies: Secondary | ICD-10-CM | POA: Diagnosis present

## 2014-12-29 DIAGNOSIS — I471 Supraventricular tachycardia: Secondary | ICD-10-CM | POA: Diagnosis not present

## 2014-12-29 DIAGNOSIS — R778 Other specified abnormalities of plasma proteins: Secondary | ICD-10-CM

## 2014-12-29 DIAGNOSIS — I5043 Acute on chronic combined systolic (congestive) and diastolic (congestive) heart failure: Principal | ICD-10-CM | POA: Diagnosis present

## 2014-12-29 DIAGNOSIS — K219 Gastro-esophageal reflux disease without esophagitis: Secondary | ICD-10-CM | POA: Diagnosis present

## 2014-12-29 DIAGNOSIS — E876 Hypokalemia: Secondary | ICD-10-CM | POA: Diagnosis not present

## 2014-12-29 DIAGNOSIS — I2510 Atherosclerotic heart disease of native coronary artery without angina pectoris: Secondary | ICD-10-CM | POA: Diagnosis present

## 2014-12-29 DIAGNOSIS — I4891 Unspecified atrial fibrillation: Secondary | ICD-10-CM | POA: Diagnosis present

## 2014-12-29 DIAGNOSIS — I959 Hypotension, unspecified: Secondary | ICD-10-CM | POA: Diagnosis not present

## 2014-12-29 DIAGNOSIS — E785 Hyperlipidemia, unspecified: Secondary | ICD-10-CM | POA: Diagnosis present

## 2014-12-29 DIAGNOSIS — Z7901 Long term (current) use of anticoagulants: Secondary | ICD-10-CM

## 2014-12-29 DIAGNOSIS — F329 Major depressive disorder, single episode, unspecified: Secondary | ICD-10-CM | POA: Diagnosis present

## 2014-12-29 DIAGNOSIS — Z79899 Other long term (current) drug therapy: Secondary | ICD-10-CM

## 2014-12-29 DIAGNOSIS — Z96653 Presence of artificial knee joint, bilateral: Secondary | ICD-10-CM | POA: Diagnosis present

## 2014-12-29 DIAGNOSIS — E0590 Thyrotoxicosis, unspecified without thyrotoxic crisis or storm: Secondary | ICD-10-CM | POA: Diagnosis present

## 2014-12-29 DIAGNOSIS — R0602 Shortness of breath: Secondary | ICD-10-CM

## 2014-12-29 DIAGNOSIS — I10 Essential (primary) hypertension: Secondary | ICD-10-CM | POA: Diagnosis present

## 2014-12-29 DIAGNOSIS — IMO0001 Reserved for inherently not codable concepts without codable children: Secondary | ICD-10-CM

## 2014-12-29 DIAGNOSIS — I509 Heart failure, unspecified: Secondary | ICD-10-CM

## 2014-12-29 DIAGNOSIS — M810 Age-related osteoporosis without current pathological fracture: Secondary | ICD-10-CM | POA: Diagnosis present

## 2014-12-29 DIAGNOSIS — Z0389 Encounter for observation for other suspected diseases and conditions ruled out: Secondary | ICD-10-CM

## 2014-12-29 DIAGNOSIS — I480 Paroxysmal atrial fibrillation: Secondary | ICD-10-CM | POA: Diagnosis present

## 2014-12-29 DIAGNOSIS — I248 Other forms of acute ischemic heart disease: Secondary | ICD-10-CM | POA: Diagnosis present

## 2014-12-29 DIAGNOSIS — Z955 Presence of coronary angioplasty implant and graft: Secondary | ICD-10-CM

## 2014-12-29 DIAGNOSIS — Z8673 Personal history of transient ischemic attack (TIA), and cerebral infarction without residual deficits: Secondary | ICD-10-CM

## 2014-12-29 LAB — BASIC METABOLIC PANEL
Anion gap: 11 (ref 5–15)
BUN: 25 mg/dL — ABNORMAL HIGH (ref 6–20)
CO2: 28 mmol/L (ref 22–32)
Calcium: 8.8 mg/dL — ABNORMAL LOW (ref 8.9–10.3)
Chloride: 100 mmol/L — ABNORMAL LOW (ref 101–111)
Creatinine, Ser: 0.96 mg/dL (ref 0.44–1.00)
GFR calc Af Amer: >60 mL/min (ref >60)
GFR calc non Af Amer: 54 mL/min — ABNORMAL LOW (ref >60)
Glucose, Bld: 128 mg/dL — ABNORMAL HIGH (ref 65–99)
Potassium: 3.6 mmol/L (ref 3.5–5.1)
Sodium: 139 mmol/L (ref 135–145)

## 2014-12-29 LAB — CBC
HCT: 42.2 % (ref 36.0–46.0)
Hemoglobin: 13.4 g/dL (ref 12.0–15.0)
MCH: 27.3 pg (ref 26.0–34.0)
MCHC: 31.8 g/dL (ref 30.0–36.0)
MCV: 86.1 fL (ref 78.0–100.0)
Platelets: 218 10*3/uL (ref 150–400)
RBC: 4.9 MIL/uL (ref 3.87–5.11)
RDW: 15.9 % — ABNORMAL HIGH (ref 11.5–15.5)
WBC: 6.8 10*3/uL (ref 4.0–10.5)

## 2014-12-29 LAB — DIFFERENTIAL
Basophils Absolute: 0 10*3/uL (ref 0.0–0.1)
Basophils Relative: 1 % (ref 0–1)
Eosinophils Absolute: 0.1 10*3/uL (ref 0.0–0.7)
Eosinophils Relative: 1 % (ref 0–5)
Lymphocytes Relative: 24 % (ref 12–46)
Lymphs Abs: 1.6 10*3/uL (ref 0.7–4.0)
MONOS PCT: 10 % (ref 3–12)
Monocytes Absolute: 0.7 10*3/uL (ref 0.1–1.0)
Neutro Abs: 4.3 10*3/uL (ref 1.7–7.7)
Neutrophils Relative %: 64 % (ref 43–77)

## 2014-12-29 LAB — BRAIN NATRIURETIC PEPTIDE: B Natriuretic Peptide: 716 pg/mL — ABNORMAL HIGH (ref 0.0–100.0)

## 2014-12-29 LAB — PROTIME-INR
INR: 1.88 — AB (ref 0.00–1.49)
PROTHROMBIN TIME: 21.5 s — AB (ref 11.6–15.2)

## 2014-12-29 LAB — I-STAT TROPONIN, ED: Troponin i, poc: 0.08 ng/mL (ref 0.00–0.08)

## 2014-12-29 LAB — TROPONIN I: Troponin I: 0.06 ng/mL — ABNORMAL HIGH (ref <0.031)

## 2014-12-29 MED ORDER — FUROSEMIDE 10 MG/ML IJ SOLN
20.0000 mg | Freq: Once | INTRAMUSCULAR | Status: AC
  Administered 2014-12-30: 20 mg via INTRAVENOUS
  Filled 2014-12-29: qty 2

## 2014-12-29 NOTE — ED Provider Notes (Signed)
CSN: [id number]     Arrival date & time 12/29/14  2200 History   First MD Initiated Contact with Patient 12/29/14 2207     Chief Complaint  Patient presents with  . Leg Swelling   (Consider location/radiation/quality/duration/timing/severity/associated sxs/prior Treatment) Patient is a 81 y.o. female presenting with shortness of breath. The history is provided by the patient and a relative. A language interpreter was used (son [place])).  Shortness of Breath Severity:  Moderate Onset quality:  Gradual Timing:  Constant Progression:  Worsening Chronicity:  Recurrent Context: activity   Relieved by:  Rest Worsened by:  Activity Ineffective treatments:  None tried Associated symptoms: no abdominal pain, no chest pain, no cough, no diaphoresis, no ear pain, no fever, no headaches, no neck pain, no rash, no sputum production, no vomiting and no wheezing   Associated symptoms comment:  Lower extremity edema   Past Medical History  Diagnosis Date  . Lumbar back pain   . Other urinary incontinence   . Memory loss   . DJD (degenerative joint disease)   . Depression   . Hypertension   . CVA (cerebral infarction)   . CHF (congestive heart failure)     Chronic Diastolic  . CAD (coronary artery disease)   . Presence of permanent cardiac pacemaker   . Osteoporosis   . Hypercholesterolemia   . Hyperthyroidism   . Anxiety   . Vertigo   . Tachy-brady syndrome   . Ischemic cardiomyopathy   . Stroke   . TIA (transient ischemic attack)    Past Surgical History  Procedure Laterality Date  . Pacemaker insertion  2011  . Total knee arthroplasty  2002    bilateral  . Abdominal hysterectomy  1986  . Bilateral stenting  1999    to the RCA  . Cardiac catheterization  05/23/10  . Total abdominal hysterectomy w/ bilateral salpingoophorectomy     Family History  Problem Relation Age of Onset  . Thyroid disease Daughter   . Diabetes Other   . Hypertension Other   . Arthritis Other    . Coronary artery disease Mother 3  . Coronary artery disease Father 9   History  Substance Use Topics  . Smoking status: Never Smoker   . Smokeless tobacco: Not on file  . Alcohol Use: No   OB History    No data available     Review of Systems  Constitutional: Positive for fatigue. Negative for fever and diaphoresis.  HENT: Negative for ear pain.   Respiratory: Positive for shortness of breath. Negative for cough, sputum production, chest tightness and wheezing.   Cardiovascular: Positive for leg swelling. Negative for chest pain.  Gastrointestinal: Negative for nausea, vomiting and abdominal pain.  Musculoskeletal: Positive for gait problem (decreased ambulation 2/2 "heavy legs"). Negative for neck pain.  Skin: Negative for rash.  Neurological: Negative for speech difficulty, weakness, light-headedness and headaches.  Psychiatric/Behavioral: Negative for confusion.  All other systems reviewed and are negative.    Allergies  Namenda; Iodine; Iohexol; and Peanut-containing drug products  Home Medications   Prior to Admission medications   Medication Sig Start Date End Date Taking? Authorizing Provider  alendronate (FOSAMAX) 70 MG tablet Take 1 tablet (70 mg total) by mouth once a week. Take with a full glass of water on an empty stomach. 10/08/14  Yes [name], NP  apixaban (ELIQUIS) 5 MG TABS tablet Take 1 tablet (5 mg total) by mouth 2 (two) times daily. 09/28/14  Yes Dietrich Pates  V, MD  Ascorbic Acid (VITAMIN C) 500 MG tablet Take 500 mg by mouth daily.     Yes Historical Provider, MD  Calcium Carbonate-Vit D-Min 600-400 MG-UNIT TABS Take 1 tablet by mouth 2 (two) times daily.    Yes Historical Provider, MD  hydrochlorothiazide (MICROZIDE) 12.5 MG capsule Take 1 capsule (12.5 mg total) by mouth daily. 09/28/14  Yes Pricilla Riffle, MD  lisinopril (PRINIVIL,ZESTRIL) 5 MG tablet Take 1 tablet (5 mg total) by mouth daily. 09/28/14  Yes Pricilla Riffle, MD  methimazole  (TAPAZOLE) 5 MG tablet Take one tablet by mouth three days a week as prescribed by endocrinology 10/08/14  Yes [name], NP  Multiple Vitamins-Minerals (CENTRUM SILVER) tablet Take 1 tablet by mouth daily.     Yes Historical Provider, MD  nitroGLYCERIN (NITROSTAT) 0.4 MG SL tablet Place 1 tablet (0.4 mg total) under the tongue every 5 (five) minutes as needed. For chest pain 03/31/14  Yes Mahima Glade Lloyd, MD  pantoprazole (PROTONIX) 40 MG tablet Take 1 tablet (40 mg total) by mouth daily at 6 (six) AM. 09/28/14  Yes Pricilla Riffle, MD  polyethylene glycol (MIRALAX / GLYCOLAX) packet Take 17 g by mouth daily as needed for moderate constipation.   Yes Historical Provider, MD  sertraline (ZOLOFT) 50 MG tablet Take 1 tablet (50 mg total) by mouth daily. 11/18/14  Yes Marvel Plan, MD  simvastatin (ZOCOR) 40 MG tablet Take 1 tablet (40 mg total) by mouth daily at 6 PM. 02/13/14  Yes Pricilla Riffle, MD   ED Triage Vitals  Enc Vitals Group     BP 12/29/14 2213 117/76 mmHg     Pulse Rate 12/29/14 2213 79     Resp 12/29/14 2213 20     Temp 12/29/14 2213 97.9 F (36.6 C)     Temp Source 12/29/14 2213 Oral     SpO2 12/29/14 2212 93 %     Weight 12/29/14 2214 180 lb (81.647 kg)     Height 12/29/14 2214 5\' 2"  (1.575 m)     Head Cir --      Peak Flow --      Pain Score 12/29/14 2214 0     Pain Loc --      Pain Edu? --      Excl. in GC? --    Physical Exam  Constitutional: She appears well-developed and well-nourished. No distress.  HENT:  Head: Normocephalic and atraumatic.  Nose: Nose normal.  Mouth/Throat: Oropharynx is clear and moist. No oropharyngeal exudate.  Eyes: EOM are normal. Pupils are equal, round, and reactive to light.  Neck: Normal range of motion. Neck supple.  Cardiovascular: Normal rate, regular rhythm, normal heart sounds and intact distal pulses.   No murmur heard. Pulmonary/Chest: Effort normal. No respiratory distress. She has no wheezes. She exhibits no tenderness.   Decreased aeration at bilateral lung bases.  Mild rhonchi throughout  Abdominal: Soft. There is no tenderness. There is no rebound and no guarding.  Musculoskeletal: Normal range of motion. She exhibits edema (3+ bilateral pitting edema). She exhibits no tenderness.  Lymphadenopathy:    She has no cervical adenopathy.  Neurological: She is alert. No cranial nerve deficit. Coordination normal.  Skin: Skin is warm and dry. She is not diaphoretic.  Psychiatric: She has a normal mood and affect. Her behavior is normal. Judgment and thought content normal.  Nursing note and vitals reviewed.   ED Course  Procedures (including critical care time) Labs Review Labs  Reviewed  BASIC METABOLIC PANEL - Abnormal; Notable for the following:    Chloride 100 (*)    Glucose, Bld 128 (*)    BUN 25 (*)    Calcium 8.8 (*)    GFR calc non Af Amer 54 (*)    All other components within normal limits  CBC - Abnormal; Notable for the following:    RDW 15.9 (*)    All other components within normal limits  PROTIME-INR - Abnormal; Notable for the following:    Prothrombin Time 21.5 (*)    INR 1.88 (*)    All other components within normal limits  DIFFERENTIAL  BRAIN NATRIURETIC PEPTIDE  URINALYSIS, ROUTINE W REFLEX MICROSCOPIC (NOT AT [facility])  TROPONIN I  Rosezena Sensor, ED    Imaging Review Dg Chest 2 View  12/30/2014   CLINICAL DATA:  82 year old female with increased fluid retention and swelling.  EXAM: CHEST  2 VIEW  COMPARISON:  Chest radiograph dated 08/25/2014  FINDINGS: Two views of the chest demonstrate clear lungs. No focal consolidation, pleural effusion, or pneumothorax. Moderate cardiomegaly. Left pectoral pacemaker device. Osteopenia with degenerative changes of the spine. Age indeterminate old appearing lower thoracic compression fractures.  IMPRESSION: No active cardiopulmonary disease.  Cardiomegaly.   Electronically Signed   By: Elgie Collard M.D.   On: 12/30/2014 00:02     EKG  Interpretation   Date/Time:  Tuesday December 29 2014 22:11:05 EDT Ventricular Rate:  86 PR Interval:  246 QRS Duration: 114 QT Interval:  427 QTC Calculation: 511 R Axis:   -73 Text Interpretation:  Atrial-paced complexes Paired ventricular premature  complexes Prolonged PR interval Left anterior fascicular block Anterior  infarct, old Nonspecific T abnormalities, lateral leads When compared with  ECG of 08/23/2014, ATRIAL PACED RHYTHM has replaced Sinus rhythm Premature  ventricular complexes are now Present Confirmed by [facility]  MD, DAVID  ([id number]) on 12/29/2014 10:24:49 PM      MDM   Final diagnoses:  SOB (shortness of breath)  Peripheral edema  CHF exacerbation   Pt is a 81 yo [place] F with hx of CAD, CHF, HTN, A fib (on Eliquis), CVA (left sided weakness) who presents with several days of fatigue, dyspnea on exertion, and lower extremity swelling.  Lives with husband and son and has required increased amounts of assistance over the past few days due to difficultly ambulating.  Today she said her legs were too heavy to move and she couldn't even stand up from the bed without assistance by her son.  He eventually got her up and she was able to ambulate a short distance with her walker but had significantly more difficulty than normally.  Has also seemed to have increased SOB for the past few days, getting DOE with just walking across the house.    O2 sats dropped to 92% on room air, so placed on 2L O2 by [place].  Looks fluid overloaded, with 3+ bilateral pitting edema and decreased aeration at bilateral lung bases.    Will get labs and CXR to evaluate for CHF exacerbation.   BUN 25, Cr 0.96, BNP 716. Istat trop elevated at 0.06.  Lab troponin also bumped at 0.08.  Likely secondary to fluid overload.  Will continue to trend while in the ED.  CXR with significant pulmonary edema, bilateral pleural effusions, and significantly enlarged cardiac silhouette.   Is not on home diuretics other than  HCTZ 12.5.  Son reports she was previously on Aldactone prior to April, but has  not been on anything else since.  Denies ever being on lasix.  Will give 20 mg IV lasix now.    PCP is Abbey Chatters, NP at [facility].  Called for admission to Hospitalist for CHF exacerbation.  Admitted to Dr. Welton Flakes.    Labs, EKGs, and imaging were reviewed and interpreted by myself and my attending, and incorporated in the medical decision making.  Patient was seen with ED Attending, Dr. Sharl Ma, MD   Lenell Antu, MD 12/30/14 [id number]  Dione Booze, MD 12/30/14 [phone]

## 2014-12-29 NOTE — ED Notes (Signed)
MD at bedside. 

## 2014-12-29 NOTE — ED Notes (Signed)
Patient here for fluid accumulation, from home by ems, son states hx of chf and increased swelling, abd distention facial edema and leg edema over last several days, pt denies pain or or sob, and lsc withems.

## 2014-12-29 NOTE — ED Notes (Signed)
Family at bedside. 

## 2014-12-30 ENCOUNTER — Encounter (HOSPITAL_COMMUNITY): Payer: Self-pay | Admitting: General Practice

## 2014-12-30 DIAGNOSIS — I5022 Chronic systolic (congestive) heart failure: Secondary | ICD-10-CM | POA: Diagnosis not present

## 2014-12-30 DIAGNOSIS — F329 Major depressive disorder, single episode, unspecified: Secondary | ICD-10-CM | POA: Diagnosis present

## 2014-12-30 DIAGNOSIS — I5033 Acute on chronic diastolic (congestive) heart failure: Secondary | ICD-10-CM

## 2014-12-30 DIAGNOSIS — I5043 Acute on chronic combined systolic (congestive) and diastolic (congestive) heart failure: Secondary | ICD-10-CM | POA: Diagnosis present

## 2014-12-30 DIAGNOSIS — I481 Persistent atrial fibrillation: Secondary | ICD-10-CM | POA: Diagnosis not present

## 2014-12-30 DIAGNOSIS — M810 Age-related osteoporosis without current pathological fracture: Secondary | ICD-10-CM | POA: Diagnosis present

## 2014-12-30 DIAGNOSIS — R0602 Shortness of breath: Secondary | ICD-10-CM | POA: Diagnosis not present

## 2014-12-30 DIAGNOSIS — I482 Chronic atrial fibrillation: Secondary | ICD-10-CM | POA: Diagnosis not present

## 2014-12-30 DIAGNOSIS — Z955 Presence of coronary angioplasty implant and graft: Secondary | ICD-10-CM | POA: Diagnosis not present

## 2014-12-30 DIAGNOSIS — E0590 Thyrotoxicosis, unspecified without thyrotoxic crisis or storm: Secondary | ICD-10-CM | POA: Diagnosis present

## 2014-12-30 DIAGNOSIS — Z8673 Personal history of transient ischemic attack (TIA), and cerebral infarction without residual deficits: Secondary | ICD-10-CM | POA: Diagnosis not present

## 2014-12-30 DIAGNOSIS — I509 Heart failure, unspecified: Secondary | ICD-10-CM | POA: Diagnosis not present

## 2014-12-30 DIAGNOSIS — Z96653 Presence of artificial knee joint, bilateral: Secondary | ICD-10-CM | POA: Diagnosis present

## 2014-12-30 DIAGNOSIS — E876 Hypokalemia: Secondary | ICD-10-CM | POA: Diagnosis not present

## 2014-12-30 DIAGNOSIS — Z950 Presence of cardiac pacemaker: Secondary | ICD-10-CM | POA: Diagnosis not present

## 2014-12-30 DIAGNOSIS — I471 Supraventricular tachycardia: Secondary | ICD-10-CM | POA: Diagnosis not present

## 2014-12-30 DIAGNOSIS — K219 Gastro-esophageal reflux disease without esophagitis: Secondary | ICD-10-CM | POA: Diagnosis present

## 2014-12-30 DIAGNOSIS — I5020 Unspecified systolic (congestive) heart failure: Secondary | ICD-10-CM | POA: Diagnosis not present

## 2014-12-30 DIAGNOSIS — I248 Other forms of acute ischemic heart disease: Secondary | ICD-10-CM | POA: Diagnosis present

## 2014-12-30 DIAGNOSIS — Z79899 Other long term (current) drug therapy: Secondary | ICD-10-CM | POA: Diagnosis not present

## 2014-12-30 DIAGNOSIS — I4891 Unspecified atrial fibrillation: Secondary | ICD-10-CM | POA: Diagnosis not present

## 2014-12-30 DIAGNOSIS — I959 Hypotension, unspecified: Secondary | ICD-10-CM | POA: Diagnosis not present

## 2014-12-30 DIAGNOSIS — E785 Hyperlipidemia, unspecified: Secondary | ICD-10-CM | POA: Diagnosis present

## 2014-12-30 DIAGNOSIS — I428 Other cardiomyopathies: Secondary | ICD-10-CM | POA: Diagnosis present

## 2014-12-30 DIAGNOSIS — R7989 Other specified abnormal findings of blood chemistry: Secondary | ICD-10-CM | POA: Diagnosis not present

## 2014-12-30 DIAGNOSIS — R609 Edema, unspecified: Secondary | ICD-10-CM | POA: Diagnosis present

## 2014-12-30 DIAGNOSIS — Z7901 Long term (current) use of anticoagulants: Secondary | ICD-10-CM | POA: Diagnosis not present

## 2014-12-30 DIAGNOSIS — I5021 Acute systolic (congestive) heart failure: Secondary | ICD-10-CM | POA: Diagnosis not present

## 2014-12-30 DIAGNOSIS — I10 Essential (primary) hypertension: Secondary | ICD-10-CM | POA: Diagnosis present

## 2014-12-30 DIAGNOSIS — I480 Paroxysmal atrial fibrillation: Secondary | ICD-10-CM | POA: Diagnosis present

## 2014-12-30 DIAGNOSIS — I429 Cardiomyopathy, unspecified: Secondary | ICD-10-CM | POA: Diagnosis not present

## 2014-12-30 DIAGNOSIS — I2510 Atherosclerotic heart disease of native coronary artery without angina pectoris: Secondary | ICD-10-CM | POA: Diagnosis not present

## 2014-12-30 LAB — URINALYSIS, ROUTINE W REFLEX MICROSCOPIC
Bilirubin Urine: NEGATIVE
GLUCOSE, UA: NEGATIVE mg/dL
Hgb urine dipstick: NEGATIVE
Ketones, ur: NEGATIVE mg/dL
LEUKOCYTES UA: NEGATIVE
Nitrite: NEGATIVE
PROTEIN: 100 mg/dL — AB
Specific Gravity, Urine: 1.016 (ref 1.005–1.030)
UROBILINOGEN UA: 1 mg/dL (ref 0.0–1.0)
pH: 6.5 (ref 5.0–8.0)

## 2014-12-30 LAB — URINE MICROSCOPIC-ADD ON

## 2014-12-30 LAB — GLUCOSE, CAPILLARY
GLUCOSE-CAPILLARY: 105 mg/dL — AB (ref 65–99)
Glucose-Capillary: 125 mg/dL — ABNORMAL HIGH (ref 65–99)
Glucose-Capillary: 160 mg/dL — ABNORMAL HIGH (ref 65–99)
Glucose-Capillary: 135 mg/dL — ABNORMAL HIGH (ref 65–99)

## 2014-12-30 LAB — TROPONIN I
TROPONIN I: 0.07 ng/mL — AB (ref <0.031)
Troponin I: 0.07 ng/mL — ABNORMAL HIGH (ref <0.031)

## 2014-12-30 LAB — TSH: TSH: 0.514 u[IU]/mL (ref 0.350–4.500)

## 2014-12-30 MED ORDER — APIXABAN 5 MG PO TABS
5.0000 mg | ORAL_TABLET | Freq: Two times a day (BID) | ORAL | Status: DC
Start: 2014-12-30 — End: 2015-01-04
  Administered 2014-12-30 – 2015-01-03 (×10): 5 mg via ORAL
  Filled 2014-12-30 (×12): qty 1

## 2014-12-30 MED ORDER — CALCIUM CARBONATE-VIT D-MIN 600-400 MG-UNIT PO TABS: 1.0000 | ORAL_TABLET | Freq: Two times a day (BID) | ORAL | Status: DC

## 2014-12-30 MED ORDER — SODIUM CHLORIDE 0.9 % IJ SOLN: 3.0000 mL | INTRAMUSCULAR | Status: DC | PRN

## 2014-12-30 MED ORDER — SIMVASTATIN 40 MG PO TABS
40.0000 mg | ORAL_TABLET | Freq: Every day | ORAL | Status: DC
  Administered 2014-12-30 – 2015-01-06 (×8): 40 mg via ORAL
  Filled 2014-12-30 (×9): qty 1

## 2014-12-30 MED ORDER — ZOLPIDEM TARTRATE 5 MG PO TABS
5.0000 mg | ORAL_TABLET | Freq: Every evening | ORAL | Status: DC | PRN
  Administered 2015-01-04: 5 mg via ORAL
  Filled 2014-12-30: qty 1

## 2014-12-30 MED ORDER — ONDANSETRON HCL 4 MG/2ML IJ SOLN: 4.0000 mg | Freq: Four times a day (QID) | INTRAMUSCULAR | Status: DC | PRN

## 2014-12-30 MED ORDER — ACETAMINOPHEN 325 MG PO TABS
650.0000 mg | ORAL_TABLET | ORAL | Status: DC | PRN
  Administered 2014-12-31 – 2015-01-03 (×3): 650 mg via ORAL
  Filled 2014-12-30 (×3): qty 2

## 2014-12-30 MED ORDER — ALENDRONATE SODIUM 70 MG PO TABS: 70.0000 mg | ORAL_TABLET | ORAL | Status: DC

## 2014-12-30 MED ORDER — LISINOPRIL 5 MG PO TABS
5.0000 mg | ORAL_TABLET | Freq: Every day | ORAL | Status: DC
  Administered 2014-12-30 – 2015-01-07 (×9): 5 mg via ORAL
  Filled 2014-12-30 (×9): qty 1

## 2014-12-30 MED ORDER — FUROSEMIDE 10 MG/ML IJ SOLN
40.0000 mg | Freq: Every day | INTRAMUSCULAR | Status: DC
  Administered 2014-12-30: 40 mg via INTRAVENOUS
  Filled 2014-12-30: qty 4

## 2014-12-30 MED ORDER — METHIMAZOLE 5 MG PO TABS
5.0000 mg | ORAL_TABLET | ORAL | Status: DC
  Administered 2014-12-30 – 2015-01-04 (×4): 5 mg via ORAL
  Filled 2014-12-30 (×6): qty 1

## 2014-12-30 MED ORDER — SODIUM CHLORIDE 0.9 % IV SOLN: 250.0000 mL | INTRAVENOUS | Status: DC | PRN

## 2014-12-30 MED ORDER — PANTOPRAZOLE SODIUM 40 MG PO TBEC
40.0000 mg | DELAYED_RELEASE_TABLET | Freq: Every day | ORAL | Status: DC
  Administered 2014-12-30 – 2015-01-07 (×9): 40 mg via ORAL
  Filled 2014-12-30 (×10): qty 1

## 2014-12-30 MED ORDER — NITROGLYCERIN 0.4 MG SL SUBL
0.4000 mg | SUBLINGUAL_TABLET | SUBLINGUAL | Status: DC | PRN
  Administered 2015-01-04 (×3): 0.4 mg via SUBLINGUAL
  Filled 2014-12-30: qty 1

## 2014-12-30 MED ORDER — INSULIN ASPART 100 UNIT/ML ~~LOC~~ SOLN
0.0000 [IU] | Freq: Three times a day (TID) | SUBCUTANEOUS | Status: DC
  Administered 2014-12-30: 2 [IU] via SUBCUTANEOUS
  Administered 2014-12-30: 3 [IU] via SUBCUTANEOUS
  Administered 2014-12-31: 2 [IU] via SUBCUTANEOUS
  Administered 2015-01-01: 5 [IU] via SUBCUTANEOUS
  Administered 2015-01-01: 2 [IU] via SUBCUTANEOUS
  Administered 2015-01-02: 3 [IU] via SUBCUTANEOUS
  Administered 2015-01-02 (×2): 2 [IU] via SUBCUTANEOUS
  Administered 2015-01-03 (×2): 3 [IU] via SUBCUTANEOUS
  Administered 2015-01-03 – 2015-01-05 (×4): 2 [IU] via SUBCUTANEOUS
  Administered 2015-01-05: 3 [IU] via SUBCUTANEOUS
  Administered 2015-01-05: 2 [IU] via SUBCUTANEOUS
  Administered 2015-01-06: 3 [IU] via SUBCUTANEOUS

## 2014-12-30 MED ORDER — CARVEDILOL 3.125 MG PO TABS
3.1250 mg | ORAL_TABLET | Freq: Two times a day (BID) | ORAL | Status: DC
  Administered 2014-12-30 – 2014-12-31 (×2): 3.125 mg via ORAL
  Filled 2014-12-30 (×4): qty 1

## 2014-12-30 MED ORDER — FUROSEMIDE 10 MG/ML IJ SOLN
40.0000 mg | Freq: Two times a day (BID) | INTRAMUSCULAR | Status: DC
  Administered 2014-12-30 – 2015-01-02 (×7): 40 mg via INTRAVENOUS
  Filled 2014-12-30 (×9): qty 4

## 2014-12-30 MED ORDER — SERTRALINE HCL 50 MG PO TABS
50.0000 mg | ORAL_TABLET | Freq: Every day | ORAL | Status: DC
  Administered 2014-12-30 – 2015-01-07 (×9): 50 mg via ORAL
  Filled 2014-12-30 (×9): qty 1

## 2014-12-30 MED ORDER — VITAMIN C 500 MG PO TABS
500.0000 mg | ORAL_TABLET | Freq: Every day | ORAL | Status: DC
  Administered 2014-12-30 – 2015-01-07 (×8): 500 mg via ORAL
  Filled 2014-12-30 (×9): qty 1

## 2014-12-30 MED ORDER — SODIUM CHLORIDE 0.9 % IJ SOLN
3.0000 mL | Freq: Two times a day (BID) | INTRAMUSCULAR | Status: DC
  Administered 2014-12-30 – 2015-01-06 (×15): 3 mL via INTRAVENOUS

## 2014-12-30 MED ORDER — POLYETHYLENE GLYCOL 3350 17 G PO PACK
17.0000 g | PACK | Freq: Every day | ORAL | Status: DC | PRN
  Filled 2014-12-30: qty 1

## 2014-12-30 MED ORDER — CALCIUM CARBONATE-VITAMIN D 500-200 MG-UNIT PO TABS
1.0000 | ORAL_TABLET | Freq: Two times a day (BID) | ORAL | Status: DC
  Administered 2014-12-30 – 2014-12-31 (×4): 1 via ORAL
  Filled 2014-12-30 (×5): qty 1

## 2014-12-30 NOTE — Progress Notes (Signed)
PHARMACIST - PHYSICIAN COMMUNICATION  CONCERNING: P&T Medication Policy Regarding Oral Bisphosphonates  RECOMMENDATION: Your order for alendronate (Fosamax), ibandronate (Boniva), or risedronate (Actonel) has been discontinued at this time.  If the patient's post-hospital medical condition warrants safe use of this class of drugs, please resume the pre-hospital regimen upon discharge.  DESCRIPTION:  Alendronate (Fosamax), ibandronate (Boniva), and risedronate (Actonel) can cause severe esophageal erosions in patients who are unable to remain upright at least 30 minutes after taking this medication.   Since brief interruptions in therapy are thought to have minimal impact on bone mineral density, the Pharmacy & Therapeutics Committee has established that bisphosphonate orders should be routinely discontinued during hospitalization.   To override this safety policy and permit administration of Boniva, Fosamax, or Actonel in the hospital, prescribers must write "DO NOT HOLD" in the comments section when placing the order for this class of medications.  Christoper Fabian, PharmD, BCPS Clinical pharmacist, pager [phone] 12/30/2014 1:39 AM

## 2014-12-30 NOTE — Progress Notes (Signed)
Admitted pt from ed AAox4. Speaks mostly [place] with very little english. Oriented to call bell and room.VS take and recorded.tele on.

## 2014-12-30 NOTE — H&P (Addendum)
[facility] History and Physical  [name] QMV:[id number] DOB: [birth year] DOA: 12/29/2014  Referring physician: Lenell Antu MD PCP: [name], NP   Chief Complaint: Leg Edema  HPI: [name] is a 81 y.o. female with history of CAD A Fib Stroke HTN hyperlipidemia presents with increased leg swelling. Patient has a history of CHF. Over the last week she has been having increased swelling of her feet. Patient was seen by the PCP and was told to elevate the legs to help reduce the swelling. Patient now started to have increased SOB also and more fatigued. She has required more help getting around whereas the son states that she normally uses the walker to get around. Patient has been in the hospital previously and has required admission to rehab for severe deconditioning. The son states that she has been progressively declining. In the ED she was noted to have low saturations. She was also given lasix and she states that she feels "so-so". Her last EF was noted to be 50% in February of this year.   Review of Systems:  Patient does not speak English to perform a full ROS and she also has a history of stroke with some memory loss  Past Medical History  Diagnosis Date  . Lumbar back pain   . Other urinary incontinence   . Memory loss   . DJD (degenerative joint disease)   . Depression   . Hypertension   . CVA (cerebral infarction)   . CHF (congestive heart failure)     Chronic Diastolic  . CAD (coronary artery disease)   . Presence of permanent cardiac pacemaker   . Osteoporosis   . Hypercholesterolemia   . Hyperthyroidism   . Anxiety   . Vertigo   . Tachy-brady syndrome   . Ischemic cardiomyopathy   . Stroke   . TIA (transient ischemic attack)    Past Surgical History  Procedure Laterality Date  . Pacemaker insertion  2011  . Total knee arthroplasty  2002    bilateral  . Abdominal hysterectomy  1986  . Bilateral stenting  1999    to the RCA  .  Cardiac catheterization  05/23/10  . Total abdominal hysterectomy w/ bilateral salpingoophorectomy     Social History:  reports that she has never smoked. She does not have any smokeless tobacco history on file. She reports that she does not drink alcohol or use illicit drugs.  Allergies  Allergen Reactions  . Namenda [Memantine Hcl] Other (See Comments)    Dizzy  . Iodine Other (See Comments)    unknown  . Iohexol Hives     Code: HIVES, Desc: hives with nonionic contrast 7 yrs ago [place] iv benedryl given   . Peanut-Containing Drug Products Other (See Comments)    unknown    Family History  Problem Relation Age of Onset  . Thyroid disease Daughter   . Diabetes Other   . Hypertension Other   . Arthritis Other   . Coronary artery disease Mother 48  . Coronary artery disease Father 26     Prior to Admission medications   Medication Sig Start Date End Date Taking? Authorizing Provider  alendronate (FOSAMAX) 70 MG tablet Take 1 tablet (70 mg total) by mouth once a week. Take with a full glass of water on an empty stomach. 10/08/14  Yes [name], NP  apixaban (ELIQUIS) 5 MG TABS tablet Take 1 tablet (5 mg total) by mouth 2 (two) times daily. 09/28/14  Yes Pricilla Riffle, MD  Ascorbic Acid (VITAMIN C) 500 MG tablet Take 500 mg by mouth daily.     Yes Historical Provider, MD  Calcium Carbonate-Vit D-Min 600-400 MG-UNIT TABS Take 1 tablet by mouth 2 (two) times daily.    Yes Historical Provider, MD  hydrochlorothiazide (MICROZIDE) 12.5 MG capsule Take 1 capsule (12.5 mg total) by mouth daily. 09/28/14  Yes Pricilla Riffle, MD  lisinopril (PRINIVIL,ZESTRIL) 5 MG tablet Take 1 tablet (5 mg total) by mouth daily. 09/28/14  Yes Pricilla Riffle, MD  methimazole (TAPAZOLE) 5 MG tablet Take one tablet by mouth three days a week as prescribed by endocrinology 10/08/14  Yes [name], NP  Multiple Vitamins-Minerals (CENTRUM SILVER) tablet Take 1 tablet by mouth daily.     Yes Historical  Provider, MD  nitroGLYCERIN (NITROSTAT) 0.4 MG SL tablet Place 1 tablet (0.4 mg total) under the tongue every 5 (five) minutes as needed. For chest pain 03/31/14  Yes Mahima Glade Lloyd, MD  pantoprazole (PROTONIX) 40 MG tablet Take 1 tablet (40 mg total) by mouth daily at 6 (six) AM. 09/28/14  Yes Pricilla Riffle, MD  polyethylene glycol (MIRALAX / GLYCOLAX) packet Take 17 g by mouth daily as needed for moderate constipation.   Yes Historical Provider, MD  sertraline (ZOLOFT) 50 MG tablet Take 1 tablet (50 mg total) by mouth daily. 11/18/14  Yes Marvel Plan, MD  simvastatin (ZOCOR) 40 MG tablet Take 1 tablet (40 mg total) by mouth daily at 6 PM. 02/13/14  Yes Pricilla Riffle, MD   Physical Exam: Filed Vitals:   12/29/14 2214 12/29/14 2215 12/29/14 2230 12/29/14 2256  BP:  124/77 133/85   Pulse:  65 81   Temp:      TempSrc:      Resp:  20 20   Height:  (1.575 m)     Weight: 81.647 kg (180 lb)   86.722 kg (191 lb 3 oz)  SpO2:  98% 100%     Wt Readings from Last 3 Encounters:  12/29/14 86.722 kg (191 lb 3 oz)  12/24/14 83.462 kg (184 lb)  11/18/14 81.647 kg (180 lb)    General:  Appears calm and comfortable Eyes: PERRL, normal lids ENT: grossly normal hearing Neck: no LAD, masses or thyromegaly Cardiovascular: IRR, no m/r/g. +LE edema. Respiratory: CTA bilaterally, no w/r/r Abdomen: soft, ntnd Skin: no rash or induration seen on limited exam Musculoskeletal: grossly normal tone BUE/BLE Psychiatric: grossly normal mood Neurologic: left sided weakness          Labs on Admission:  Basic Metabolic Panel:  Recent Labs Lab 12/29/14 2249  NA 139  K 3.6  CL 100*  CO2 28  GLUCOSE 128*  BUN 25*  CREATININE 0.96  CALCIUM 8.8*   Liver Function Tests: No results for input(s): AST, ALT, ALKPHOS, BILITOT, PROT, ALBUMIN in the last 168 hours. No results for input(s): LIPASE, AMYLASE in the last 168 hours. No results for input(s): AMMONIA in the last 168 hours. CBC:  Recent Labs Lab  12/29/14 2249  WBC 6.8  NEUTROABS 4.3  HGB 13.4  HCT 42.2  MCV 86.1  PLT 218   Cardiac Enzymes:  Recent Labs Lab 12/29/14 2249  TROPONINI 0.06*    BNP (last 3 results)  Recent Labs  12/29/14 2249  BNP 716.0*    ProBNP (last 3 results) No results for input(s): PROBNP in the last 8760 hours.  CBG: No results for input(s): GLUCAP in the last  168 hours.  Radiological Exams on Admission: Dg Chest 2 View  12/30/2014   CLINICAL DATA:  81 year old female with increased fluid retention and swelling.  EXAM: CHEST  2 VIEW  COMPARISON:  Chest radiograph dated 08/25/2014  FINDINGS: Two views of the chest demonstrate clear lungs. No focal consolidation, pleural effusion, or pneumothorax. Moderate cardiomegaly. Left pectoral pacemaker device. Osteopenia with degenerative changes of the spine. Age indeterminate old appearing lower thoracic compression fractures.  IMPRESSION: No active cardiopulmonary disease.  Cardiomegaly.   Electronically Signed   By: Elgie Collard M.D.   On: 12/30/2014 00:02      Assessment/Plan Active Problems:   Essential hypertension   Atrial fibrillation   HLD (hyperlipidemia)   Acute systolic heart failure   1. Acute Systolic Heart failure -will admit to telemetry -will start on CHF pathway -get echo in am -will start on lasix 40mg  daily -strict IOs -will also check serial enzymes  2. HTN -will be continue on lisinopril  3. Atrial Fibrillation -rate controlled  -will continue eliquis  4. Hyperlipidemia -will continue with statins  5. Hyperthyroid -on tapazole will be continued as ordered  6. Depression -will continue with zoloft   Code Status: full code (must indicate code status--if unknown or must be presumed, indicate so) DVT Prophylaxis:eliquis Family Communication: son (indicate person spoken with, if applicable, with phone number if by telephone) Disposition Plan: home (indicate anticipated LOS)  Time spent:   [facility] Pager [phone]

## 2014-12-30 NOTE — Progress Notes (Signed)
Patient seen and examined. Admitted after midnight secondary to worsening LE edema and SOB over 1 week. Patient with acute on chronic CHF. Feeling slightly better better but still SOB, with positive JVD and 2-3++ lower extremity edema on exam. No CP, no fever. Please referred to H&P written by Dr. Welton Flakes for further info/details on admission.  Plan: -strict intake and output -IV lasix 40mg  BID -follow 2-D echo -start TED hoses -daily weight -continue lisinopril  -daily BMET  Vassie Loll [phone]

## 2014-12-30 NOTE — Progress Notes (Addendum)
Heart Failure Navigator Consult Note  Presentation: [name] is a 81 y.o. female with history of CAD A Fib Stroke HTN hyperlipidemia presents with increased leg swelling. Patient has a history of CHF. Over the last week she has been having increased swelling of her feet. Patient was seen by the PCP and was told to elevate the legs to help reduce the swelling. Patient now started to have increased SOB also and more fatigued. She has required more help getting around whereas the son states that she normally uses the walker to get around. Patient has been in the hospital previously and has required admission to rehab for severe deconditioning. The son states that she has been progressively declining. In the ED she was noted to have low saturations. She was also given lasix and she states that she feels "so-so". Her last EF was noted to be 50% in February of this year   Past Medical History  Diagnosis Date  . Lumbar back pain   . Other urinary incontinence   . Memory loss   . DJD (degenerative joint disease)   . Depression   . Hypertension   . CVA (cerebral infarction)   . CHF (congestive heart failure)     Chronic Diastolic  . CAD (coronary artery disease)   . Presence of permanent cardiac pacemaker   . Osteoporosis   . Hypercholesterolemia   . Hyperthyroidism   . Anxiety   . Vertigo   . Tachy-brady syndrome   . Ischemic cardiomyopathy   . Stroke   . TIA (transient ischemic attack)     History   Social History  . Marital Status: Married    Spouse Name: N/A  . Number of Children: 2  . Years of Education: 8   Occupational History  . retired     Programmer, applications at office   Social History Main Topics  . Smoking status: Never Smoker   . Smokeless tobacco: Never Used  . Alcohol Use: No  . Drug Use: No  . Sexual Activity: Not on file   Other Topics Concern  . None   Social History Narrative   Married   Right handed   Caffeine use - occasionally     ECHO:Study  Conclusions--12/31/14  - Left ventricle: Diffuse hypokinesis with mid and basal inferior akinesis The cavity size was severely dilated. Wall thickness was increased in a pattern of mild LVH. There was mild focal basal and mild concentric hypertrophy of the septum. The estimated ejection fraction was 25%. - Mitral valve: There was moderate regurgitation. - Left atrium: The atrium was severely dilated. - Right ventricle: The cavity size was mildly dilated. - Right atrium: The atrium was mildly dilated. - Atrial septum: No defect or patent foramen ovale was identified. - Tricuspid valve: There was moderate regurgitation. - Pulmonary arteries: PA peak pressure: 34 mm Hg (S).  ------------------------------------------------------------------- Labs, prior tests, procedures, and surgery: Permanent pacemaker system implantation.  Transthoracic echocardiography. M-mode, complete 2D, spectral Doppler, and color Doppler. Birthdate: Patient birthdate: 09-23-1931. Age: Patient is 81 yr old. Sex: Gender: female. BMI: 32.9 kg/m^2. Blood pressure:   116/77 Patient status: Inpatient. Study date: Study date: 12/31/2014. Study time: 10:54 AM. Location: Bedside.  BNP    Component Value Date/Time   BNP 716.0* 12/29/2014 2249    ProBNP    Component Value Date/Time   PROBNP 464.0* 08/25/2010 0515     Education Assessment and Provision:  Detailed education and instructions provided on heart failure disease management including  the following:  Signs and symptoms of Heart Failure When to call the physician Importance of daily weights Low sodium diet Fluid restriction Medication management Anticipated future follow-up appointments    I attempted to come and speak with [name] and family regarding her HF and HF recommendations for home.  She speaks [place] and very little [place].  She currently has no visitors in room and nurse was attempting to get interpreter to  complete H&P.  I will plan to return later when hopefully family is present so that I can educate all at one time.   I have returned to speak with [name]'s son.  He says that they have received all of the educational materials before--however also admits that they do not weigh her daily and that they "watch her legs for fluid".  I explained the importance of daily weights and how they relate to signs and symptoms of HF.  He says that they attempt to watch her salt/sodium intake--yet sometimes because of minimal intake the husband just tries to get her to eat--and those choices may be high sodium.  The son denies any issues with his mother getting or taking medications--yet does say that he feels too many people have been making med changes.   He tells me he fears that she has "given up" as she has "really declined" in the past few weeks.  She lives with her husband (in an in-law apt) and the son lives next door.  The son tells me that he works everyday and that his mother is often there with only her husband (who is in poor health himself).   Education Materials:  "Living Better With Heart Failure" Booklet, Daily Weight Tracker    High Risk Criteria for Readmission and/or Poor Patient Outcomes:   EF <30%-Yes 25%  2 or more admissions in 6 months- Yes  Difficult social situation- yes? Lives with son ? Ability to provide care   Demonstrates medication noncompliance- ? No   Barriers of Care:  Knowledge and compliance related to caregiver availability and ability.  Discharge Planning:  Plan to go to SNF

## 2014-12-31 ENCOUNTER — Ambulatory Visit (HOSPITAL_COMMUNITY): Payer: Medicare Other

## 2014-12-31 DIAGNOSIS — I10 Essential (primary) hypertension: Secondary | ICD-10-CM

## 2014-12-31 DIAGNOSIS — I509 Heart failure, unspecified: Secondary | ICD-10-CM

## 2014-12-31 DIAGNOSIS — I5022 Chronic systolic (congestive) heart failure: Secondary | ICD-10-CM

## 2014-12-31 DIAGNOSIS — I480 Paroxysmal atrial fibrillation: Secondary | ICD-10-CM

## 2014-12-31 LAB — BASIC METABOLIC PANEL
Anion gap: 8 (ref 5–15)
BUN: 20 mg/dL (ref 6–20)
CO2: 37 mmol/L — AB (ref 22–32)
CREATININE: 1.14 mg/dL — AB (ref 0.44–1.00)
Calcium: 8.7 mg/dL — ABNORMAL LOW (ref 8.9–10.3)
Chloride: 95 mmol/L — ABNORMAL LOW (ref 101–111)
GFR calc non Af Amer: 44 mL/min — ABNORMAL LOW (ref >60)
GFR, EST AFRICAN AMERICAN: 51 mL/min — AB (ref >60)
Glucose, Bld: 130 mg/dL — ABNORMAL HIGH (ref 65–99)
POTASSIUM: 3.4 mmol/L — AB (ref 3.5–5.1)
Sodium: 140 mmol/L (ref 135–145)

## 2014-12-31 LAB — GLUCOSE, CAPILLARY
GLUCOSE-CAPILLARY: 99 mg/dL (ref 65–99)
Glucose-Capillary: 119 mg/dL — ABNORMAL HIGH (ref 65–99)
Glucose-Capillary: 128 mg/dL — ABNORMAL HIGH (ref 65–99)

## 2014-12-31 LAB — HEMOGLOBIN A1C
Hgb A1c MFr Bld: 6.8 % — ABNORMAL HIGH (ref 4.8–5.6)
MEAN PLASMA GLUCOSE: 148 mg/dL

## 2014-12-31 LAB — TROPONIN I: Troponin I: 0.08 ng/mL — ABNORMAL HIGH (ref <0.031)

## 2014-12-31 MED ORDER — CARVEDILOL 3.125 MG PO TABS
3.1250 mg | ORAL_TABLET | Freq: Two times a day (BID) | ORAL | Status: DC
  Administered 2015-01-01 – 2015-01-07 (×12): 3.125 mg via ORAL
  Filled 2014-12-31 (×15): qty 1

## 2014-12-31 MED ORDER — CALCIUM CARBONATE-VITAMIN D 500-200 MG-UNIT PO TABS
1.0000 | ORAL_TABLET | Freq: Two times a day (BID) | ORAL | Status: DC
  Administered 2015-01-01 – 2015-01-07 (×11): 1 via ORAL
  Filled 2014-12-31 (×15): qty 1

## 2014-12-31 NOTE — Consult Note (Signed)
Patient ID: MARSI TURVEY MRN: [id number], DOB/AGE: 79-Jun-1933   Admit date: 12/29/2014   Primary Physician: [name], NP Primary Cardiologist: Dr. Tenny Craw  Pt. Profile:  81 y/o female with h/o PAF, stroke and symptomatic bradycardia s/p PMM admitted with new onset systolic CHF.   Problem List  Past Medical History  Diagnosis Date  . Lumbar back pain   . Other urinary incontinence   . Memory loss   . DJD (degenerative joint disease)   . Depression   . Hypertension   . CVA (cerebral infarction)   . CHF (congestive heart failure)     Chronic Diastolic  . CAD (coronary artery disease)   . Presence of permanent cardiac pacemaker   . Osteoporosis   . Hypercholesterolemia   . Hyperthyroidism   . Anxiety   . Vertigo   . Tachy-brady syndrome   . Ischemic cardiomyopathy   . Stroke   . TIA (transient ischemic attack)     Past Surgical History  Procedure Laterality Date  . Pacemaker insertion  2011  . Total knee arthroplasty  2002    bilateral  . Abdominal hysterectomy  1986  . Bilateral stenting  1999    to the RCA  . Cardiac catheterization  05/23/10  . Total abdominal hysterectomy w/ bilateral salpingoophorectomy       Allergies  Allergies  Allergen Reactions  . Namenda [Memantine Hcl] Other (See Comments)    Dizzy  . Iodine Other (See Comments)    unknown  . Iohexol Hives     Code: HIVES, Desc: hives with nonionic contrast [place] yrs ago ([place] iv benedryl given   . Peanut-Containing Drug Products Other (See Comments)    unknown    HPI  81 y.o. female, followed by Dr. Tenny Craw, with a history of stroke, symptomatic bradycardia, and paroxysmal atrial fibrillation, status post pacemaker insertion.  She take Eliquis for anticoagulation. She had a 2D echo 07/2014 which demonstrated low normal LVF with EF of 50%. However, she was just admitted yesterday for acute CHF and repeat 2D echo today demonstrated new reduction in EF to severe range of 25%. There is  also diffuse hypokinesis with mid and basal inferior akinesis The cavity size was severely dilated. Wall thickness was increased in a pattern of mild LVH. There was mild focal basal and mild concentric hypertrophy of the septum. Moderate MR also noted. She has had problems with increasing swelling of her feet and was seen by her PCP and was told to elevated her legs.  She then started to have increased SOB with increased fatigue.  Per the son, she has been progressively declining.  In ER she was hypoxic.  Cardiac enzymes minimally elevated with flat trend (0.07, 0.07, 0.08). BNP was 700 on admit. History is limited and most history obtained from hospitalist H&P.  She does not speak [place]. She speaks [place] and no interpreter is present at time of this consult.   Home Medications  Prior to Admission medications   Medication Sig Start Date End Date Taking? Authorizing Provider  alendronate (FOSAMAX) 70 MG tablet Take 1 tablet (70 mg total) by mouth once a week. Take with a full glass of water on an empty stomach. 10/08/14  Yes [name], NP  apixaban (ELIQUIS) 5 MG TABS tablet Take 1 tablet (5 mg total) by mouth 2 (two) times daily. 09/28/14  Yes Pricilla Riffle, MD  Ascorbic Acid (VITAMIN C) 500 MG tablet Take 500 mg by mouth daily.  Yes Historical Provider, MD  Calcium Carbonate-Vit D-Min 600-400 MG-UNIT TABS Take 1 tablet by mouth 2 (two) times daily.    Yes Historical Provider, MD  hydrochlorothiazide (MICROZIDE) 12.5 MG capsule Take 1 capsule (12.5 mg total) by mouth daily. 09/28/14  Yes Pricilla Riffle, MD  lisinopril (PRINIVIL,ZESTRIL) 5 MG tablet Take 1 tablet (5 mg total) by mouth daily. 09/28/14  Yes Pricilla Riffle, MD  methimazole (TAPAZOLE) 5 MG tablet Take one tablet by mouth three days a week as prescribed by endocrinology 10/08/14  Yes [name], NP  Multiple Vitamins-Minerals (CENTRUM SILVER) tablet Take 1 tablet by mouth daily.     Yes Historical Provider, MD  nitroGLYCERIN  (NITROSTAT) 0.4 MG SL tablet Place 1 tablet (0.4 mg total) under the tongue every 5 (five) minutes as needed. For chest pain 03/31/14  Yes Mahima Glade Lloyd, MD  pantoprazole (PROTONIX) 40 MG tablet Take 1 tablet (40 mg total) by mouth daily at 6 (six) AM. 09/28/14  Yes Pricilla Riffle, MD  polyethylene glycol (MIRALAX / GLYCOLAX) packet Take 17 g by mouth daily as needed for moderate constipation.   Yes Historical Provider, MD  sertraline (ZOLOFT) 50 MG tablet Take 1 tablet (50 mg total) by mouth daily. 11/18/14  Yes Marvel Plan, MD  simvastatin (ZOCOR) 40 MG tablet Take 1 tablet (40 mg total) by mouth daily at 6 PM. 02/13/14  Yes Pricilla Riffle, MD    Family History  Family History  Problem Relation Age of Onset  . Thyroid disease Daughter   . Diabetes Other   . Hypertension Other   . Arthritis Other   . Coronary artery disease Mother 57  . Coronary artery disease Father 51    Social History  History   Social History  . Marital Status: Married    Spouse Name: N/A  . Number of Children: 2  . Years of Education: 8   Occupational History  . retired     Programmer, applications at office   Social History Main Topics  . Smoking status: Never Smoker   . Smokeless tobacco: Never Used  . Alcohol Use: No  . Drug Use: No  . Sexual Activity: Not on file   Other Topics Concern  . Not on file   Social History Narrative   Married   Right handed   Caffeine use - occasionally      Review of Systems General:  No chills, fever, night sweats or weight changes.  Cardiovascular:  No chest pain, dyspnea on exertion, edema, orthopnea, palpitations, paroxysmal nocturnal dyspnea. Dermatological: No rash, lesions/masses Respiratory: No cough, dyspnea Urologic: No hematuria, dysuria Abdominal:   No nausea, vomiting, diarrhea, bright red blood per rectum, melena, or hematemesis Neurologic:  No visual changes, wkns, changes in mental status. All other systems reviewed and are otherwise negative except as noted  above.  Physical Exam  Blood pressure 92/75, pulse 79, temperature 97.9 F (36.6 C), temperature source Oral, resp. rate 20, height  (1.575 m), weight 180 lb 9.6 oz (81.92 kg), SpO2 99 %.  General: Pleasant, NAD Psych: Normal affect. Neuro: Alert and oriented X 3. Moves all extremities spontaneously. HEENT: Normal  Neck: Supple without bruits or JVD. Lungs:  Resp regular and unlabored, bilbasilar rales Heart: RRR no s3, s4, or murmurs. Abdomen: Soft, non-tender, non-distended, BS + x 4.  Extremities: No clubbing, cyanosis. 2+ pitting LEE. DP/PT/Radials 2+ and equal bilaterally.  Labs  Troponin [facility] of Care Test)  Recent Labs  12/29/14  2255  TROPIPOC 0.08    Recent Labs  12/29/14 2249 12/30/14 0418 12/30/14 1925 12/31/14 0318  TROPONINI 0.06* 0.07* 0.07* 0.08*   Lab Results  Component Value Date   WBC 6.8 12/29/2014   HGB 13.4 12/29/2014   HCT 42.2 12/29/2014   MCV 86.1 12/29/2014   PLT 218 12/29/2014    Recent Labs Lab 12/31/14 0318  NA 140  K 3.4*  CL 95*  CO2 37*  BUN 20  CREATININE 1.14*  CALCIUM 8.7*  GLUCOSE 130*   Lab Results  Component Value Date   CHOL 166 08/24/2014   HDL 45 08/24/2014   LDLCALC 83 08/24/2014   TRIG 190* 08/24/2014   No results found for: DDIMER   Radiology/Studies  Dg Chest 2 View  12/30/2014   CLINICAL DATA:  81 year old female with increased fluid retention and swelling.  EXAM: CHEST  2 VIEW  COMPARISON:  Chest radiograph dated 08/25/2014  FINDINGS: Two views of the chest demonstrate clear lungs. No focal consolidation, pleural effusion, or pneumothorax. Moderate cardiomegaly. Left pectoral pacemaker device. Osteopenia with degenerative changes of the spine. Age indeterminate old appearing lower thoracic compression fractures.  IMPRESSION: No active cardiopulmonary disease.  Cardiomegaly.   Electronically Signed   By: Elgie Collard M.D.   On: 12/30/2014 00:02     Echocardiogram  Study Conclusions  - Left  ventricle: Diffuse hypokinesis with mid and basal inferior akinesis The cavity size was severely dilated. Wall thickness was increased in a pattern of mild LVH. There was mild focal basal and mild concentric hypertrophy of the septum. The estimated ejection fraction was 25%. - Mitral valve: There was moderate regurgitation. - Left atrium: The atrium was severely dilated. - Right ventricle: The cavity size was mildly dilated. - Right atrium: The atrium was mildly dilated. - Atrial septum: No defect or patent foramen ovale was identified. - Tricuspid valve: There was moderate regurgitation. - Pulmonary arteries: PA peak pressure: 34 mm Hg (S).     ASSESSMENT AND PLAN 1  Acute on chronic systolic CHF with new onset DCM of unknown etiology.  She has evidence of CHF on exam with bibasilar rales and LE pitting edema.  BNP is elevated although chest xray did not show volume overload.  Agree with low dose carvedilol and ACE I.  IV lasix and follow renal function and potassium while diuresing.  Consider adding low dose spironolactone.   2.  New onset dilated CM of unknown etiology.  ? Progression of CAD.  She has apparently had progressive functional decline so will need to discuss goals of care with family before deciding on further ischemic w/u. 3.  Mildly elevated troponin with flat trend most likely related to CHF with demand ischemia.   4.  PAF maintained a paced rhythm on BB and NOAC 5.  Hyperthyroidism on methimazole - TSH ok    Signed, SIMMONS, BRITTAINY, PA-C 12/31/2014, 7:05 PM

## 2014-12-31 NOTE — Evaluation (Signed)
Physical Therapy Evaluation Patient Details Name: [name] MRN: [id number] DOB: [birth year] Today's Date: 12/31/2014   History of Present Illness  Pt admitted with LE swelling, acute heart failure. Pt report being on 02 at home.PMH: CAD, afib, CVA, HTN.  Per chart, son reports steady decline in functional status at home.  Clinical Impression  Pt admitted with above diagnosis. Pt currently with functional limitations due to the deficits listed below (see PT Problem List). Appears pt has been functioning marginally.  Will benefit from NH prior to d/c home.   Pt will benefit from skilled PT to increase their independence and safety with mobility to all discharge to the venue listed below.     Follow Up Recommendations SNF;Supervision/Assistance - 24 hour    Equipment Recommendations  None recommended by PT    Recommendations for Other Services       Precautions / Restrictions Precautions Precautions: Fall Restrictions Weight Bearing Restrictions: No      Mobility  Bed Mobility Overal bed mobility: Needs Assistance;+2 for physical assistance Bed Mobility: Supine to Sit;Sit to Supine     Supine to sit: Mod assist;+2 for physical assistance;HOB elevated Sit to supine: +2 for physical assistance;Mod assist   General bed mobility comments: returned to bed for echo  Transfers Overall transfer level: Needs assistance Equipment used: Rolling walker (2 wheeled) Transfers: Sit to/from [employer] Sit to Stand: +2 physical assistance;Mod assist Stand pivot transfers: +2 physical assistance;Mod assist       General transfer comment: assist to rise and balance as pt took shuffling steps from bed to [facility], BSC to chair, and chair to bed for test. Pt attempts to sit prematurely. Appeared to attempt to minimize weight bearing on R foot.  Ambulation/Gait                Stairs            Wheelchair Mobility    Modified Rankin (Stroke Patients Only)        Balance Overall balance assessment: Needs assistance Sitting-balance support: No upper extremity supported;Feet supported Sitting balance-Leahy Scale: Fair     Standing balance support: Bilateral upper extremity supported;During functional activity Standing balance-Leahy Scale: Poor Standing balance comment: requires UE support for balance.                              Pertinent Vitals/Pain Pain Assessment: Faces Faces Pain Scale: Hurts little more Pain Location: right LE Pain Descriptors / Indicators: Guarding;Grimacing Pain Intervention(s): Limited activity within patient's tolerance;Monitored during session;Repositioned  VSS    Home Living Family/patient expects to be discharged to:: Skilled nursing facility Living Arrangements: Children;Spouse/significant other Available Help at Discharge: Family;Available PRN/intermittently Type of Home: Apartment Home Access: Level entry     Home Layout: One level Home Equipment: Shower seat - built in;Shower seat;Grab bars - toilet;Hand held Careers information officer - 2 wheels (02) Additional Comments: home information from previous admission    Prior Function Level of Independence: Needs assistance   Gait / Transfers Assistance Needed: ambulated within her home with RW  ADL's / Homemaking Assistance Needed: Pt reports assistance for LB dressing PTA.  Comments: husband unable to assist, also on 02, son works     Higher education careers adviser Dominance   Dominant Hand: Right    Extremity/Trunk Assessment   Upper Extremity Assessment: Defer to OT evaluation           Lower Extremity Assessment: RLE deficits/detail RLE Deficits /  Details: grossly 3/5       Communication   Communication: Prefers language other than English [place])  Cognition Arousal/Alertness: Awake/alert Behavior During Therapy: WFL for tasks assessed/performed Overall Cognitive Status: Difficult to assess                      General Comments       Exercises General Exercises - Lower Extremity Ankle Circles/Pumps: AROM;Both;10 reps;Seated Long Arc Quad: AROM;Both;10 reps;Seated      Assessment/Plan    PT Assessment Patient needs continued PT services  PT Diagnosis Generalized weakness   PT Problem List Decreased range of motion;Decreased activity tolerance;Decreased strength;Decreased balance;Decreased mobility;Decreased knowledge of use of DME;Decreased safety awareness;Decreased knowledge of precautions;Pain  PT Treatment Interventions DME instruction;Gait training;Functional mobility training;Therapeutic activities;Therapeutic exercise;Balance training;Patient/family education   PT Goals (Current goals can be found in the Care Plan section) Acute Rehab PT Goals Patient Stated Goal: Pt asking for more help at home. PT Goal Formulation: With patient Time For Goal Achievement: 01/14/15 Potential to Achieve Goals: Good    Frequency Min 2X/week   Barriers to discharge Decreased caregiver support      Co-evaluation PT/OT/SLP Co-Evaluation/Treatment: Yes Reason for Co-Treatment: For patient/therapist safety PT goals addressed during session: Mobility/safety with mobility OT goals addressed during session: ADL's and self-care       End of Session Equipment Utilized During Treatment: Gait belt;Oxygen Activity Tolerance: Patient limited by fatigue Patient left: in chair;with call bell/phone within reach;with chair alarm set Nurse Communication: Mobility status         Time: 1030-1053 PT Time Calculation (min) (ACUTE ONLY): 23 min   Charges:   PT Evaluation $Initial PT Evaluation Tier I: 1 Procedure     PT G CodesBerline Lopes 2015/01/28, 2:20 PM Subrina Vecchiarelli [facility] Acute Rehabilitation [phone] (pager)

## 2014-12-31 NOTE — Progress Notes (Signed)
[facility] PROGRESS NOTE Interim History: 81 y.o. female with history of CAD A Fib Stroke HTN hyperlipidemia presents with increased leg swelling. Patient has a history of CHF. Over the last week she has been having increased swelling of her feet. Patient was seen by the PCP and was told to elevate the legs to help reduce the swelling. Patient now started to have increased SOB also and more fatigued. She has required more help getting around whereas the son states that she normally uses the walker to get around. Patient has been in the hospital previously and has required admission to rehab for severe deconditioning. The son states that she has been progressively declining. In the ED she was noted to have low saturations.   Filed Weights   12/29/14 2256 12/30/14 0128 12/31/14 0447  Weight: 86.722 kg (191 lb 3 oz) 86.546 kg (190 lb 12.8 oz) 81.92 kg (180 lb 9.6 oz)        Intake/Output Summary (Last 24 hours) at 12/31/14 1814 Last data filed at 12/31/14 1516  Gross per 24 hour  Intake    295 ml  Output      0 ml  Net    295 ml     Assessment/Plan: 1-Acute on chronic combined systolic and diastolic heart failure -patient with fluid overload on exam -mild elevation of troponin and Echo demonstrating significant reduction on her EF (now down to 25%) -will continue diuresis -coreg low dose added; will continue lisinopril -cardiology consulted -continue daily weights and low sodium diet -Strict intake and output -patient denies CP  2-HLD (hyperlipidemia): continue zocor  3-Atrial fibrillation: rate control -will continue eliquis  4-Essential hypertension: soft, but stable -will monitor and hold medications as needed  5-depression: continue sertraline  6-GERD: continue PPI  7-hyperthyroidism: continue current dose of methimazole -TSH WNL   Code Status: Full Family Communication: no family at bedside Disposition Plan: to be determine, most likely SNF at discharge  for rehab   Consultants:  Cardiology   Procedures: ECHO:  - Left ventricle: Diffuse hypokinesis with mid and basal inferior akinesis The cavity size was severely dilated. Wall thickness was increased in a pattern of mild LVH. There was mild focal basal and mild concentric hypertrophy of the septum. The estimated ejection fraction was 25%. - Mitral valve: There was moderate regurgitation. - Left atrium: The atrium was severely dilated. - Right ventricle: The cavity size was mildly dilated. - Right atrium: The atrium was mildly dilated. - Atrial septum: No defect or patent foramen ovale was identified. - Tricuspid valve: There was moderate regurgitation. - Pulmonary arteries: PA peak pressure: 34 mm Hg (S).  Antibiotics:  None   HPI/Subjective: Afebrile, feeling better and no complaining of CP. Breathing improved.  Objective: Filed Vitals:   12/31/14 0447 12/31/14 1200 12/31/14 1415 12/31/14 1749  BP: 116/77 107/70 114/66 92/75  Pulse: 60 68 63 79  Temp: 97.6 F (36.4 C)  97.9 F (36.6 C)   TempSrc: Oral  Oral   Resp: 20     Height:      Weight: 81.92 kg (180 lb 9.6 oz)     SpO2: 98%  99%      Exam:  General: in no acute distress. Denies CP and reports breathing is better. No fever. HEENT: No bruits, no goiter. No JVD appreciated due to body habitus Heart: no rubs, no gallops, rate controlled Lungs: positive crackles at bases, no wheezing, improve air movement  Abdomen: Soft, nontender, nondistended, positive bowel sounds.  Neuro: Grossly intact, nonfocal. Extremities: 2++ edema bilaterally   Data Reviewed: Basic Metabolic Panel:  Recent Labs Lab 12/29/14 2249 12/31/14 0318  NA 139 140  K 3.6 3.4*  CL 100* 95*  CO2 28 37*  GLUCOSE 128* 130*  BUN 25* 20  CREATININE 0.96 1.14*  CALCIUM 8.8* 8.7*   CBC:  Recent Labs Lab 12/29/14 2249  WBC 6.8  NEUTROABS 4.3  HGB 13.4  HCT 42.2  MCV 86.1  PLT 218   Cardiac Enzymes:  Recent  Labs Lab 12/29/14 2249 12/30/14 0418 12/30/14 1925 12/31/14 0318  TROPONINI 0.06* 0.07* 0.07* 0.08*   BNP (last 3 results)  Recent Labs  12/29/14 2249  BNP 716.0*   CBG:  Recent Labs Lab 12/30/14 1646 12/30/14 2054 12/31/14 0627 12/31/14 1114 12/31/14 1647  GLUCAP 160* 135* 119* 128* 99    Studies: Dg Chest 2 View  12/30/2014   CLINICAL DATA:  81 year old female with increased fluid retention and swelling.  EXAM: CHEST  2 VIEW  COMPARISON:  Chest radiograph dated 08/25/2014  FINDINGS: Two views of the chest demonstrate clear lungs. No focal consolidation, pleural effusion, or pneumothorax. Moderate cardiomegaly. Left pectoral pacemaker device. Osteopenia with degenerative changes of the spine. Age indeterminate old appearing lower thoracic compression fractures.  IMPRESSION: No active cardiopulmonary disease.  Cardiomegaly.   Electronically Signed   By: Elgie Collard M.D.   On: 12/30/2014 00:02    Scheduled Meds: . apixaban  5 mg Oral BID  . calcium-vitamin D  1 tablet Oral BID WC  . carvedilol  3.125 mg Oral BID WC  . furosemide  40 mg Intravenous BID  . insulin aspart  0-15 Units Subcutaneous TID WC  . lisinopril  5 mg Oral Daily  . methimazole  5 mg Oral Once per day on Mon Wed Fri  . pantoprazole  40 mg Oral Q0600  . sertraline  50 mg Oral Daily  . simvastatin  40 mg Oral q1800  . sodium chloride  3 mL Intravenous Q12H  . vitamin C  500 mg Oral Daily   Continuous Infusions:   Time: 35 minutes  Vassie Loll  [facility] Pager [phone]. If 7PM-7AM, please contact night-coverage at [email or web address], password [facility] 12/31/2014, 6:14 PM  LOS: 1 day

## 2014-12-31 NOTE — Progress Notes (Signed)
  Echocardiogram 2D Echocardiogram has been performed.  [name] 12/31/2014, 11:55 AM

## 2014-12-31 NOTE — Progress Notes (Signed)
UR COMPLETED  

## 2014-12-31 NOTE — Evaluation (Signed)
Occupational Therapy Evaluation Patient Details Name: [name] MRN: [id number] DOB: 1931-09-15 Today's Date: 12/31/2014    History of Present Illness Pt admitted with LE swelling, acute heart failure. Pt report being on 02 at home.PMH: CAD, afib, CVA, HTN.  Per chart, son reports steady decline in functional status at home.   Clinical Impression   Prior to admission, pt was assisted for ADL and ambulated short distances with RW at home.  Per chart, pt with declining functional status prior to admission and has been to SNF for rehab after prior hospitalization. Pt currently requiring +2 assistance for all mobility and assistance for bathing, dressing and toileting.  Pt reports her husband cannot assist as he is also sick and son works.  Recommending SNF.  Will defer further OT to SNF.    Follow Up Recommendations  SNF;Supervision/Assistance - 24 hour    Equipment Recommendations       Recommendations for Other Services       Precautions / Restrictions Precautions Precautions: Fall Restrictions Weight Bearing Restrictions: No      Mobility Bed Mobility Overal bed mobility: Needs Assistance;+2 for physical assistance Bed Mobility: Supine to Sit;Sit to Supine     Supine to sit: Mod assist;+2 for physical assistance;HOB elevated Sit to supine: +2 for physical assistance;Mod assist   General bed mobility comments: returned to bed for echo  Transfers Overall transfer level: Needs assistance Equipment used: Rolling walker (2 wheeled) Transfers: Sit to/from [employer] Sit to Stand: +2 physical assistance;Mod assist Stand pivot transfers: +2 physical assistance;Mod assist       General transfer comment: assist to rise and balance as pt took shuffling steps from bed to [facility], BSC to chair, and chair to bed for test. Pt attempts to sit prematurely. Appeared to attempt to minimize weight bearing on R foot.    Balance Overall balance assessment: Needs  assistance Sitting-balance support: No upper extremity supported Sitting balance-Leahy Scale: Fair       Standing balance-Leahy Scale: Poor                              ADL Overall ADL's : Needs assistance/impaired Eating/Feeding: Set up;Sitting   Grooming: Wash/dry face;Set up;Sitting   Upper Body Bathing: Minimal assitance;Sitting   Lower Body Bathing: Total assistance;Sit to/from stand   Upper Body Dressing : Set up;Sitting   Lower Body Dressing: Total assistance;Sit to/from stand Lower Body Dressing Details (indicate cue type and reason): pt indicated that she cannot donn her socks at baseline Toilet Transfer: +2 for physical assistance;Moderate assistance;Stand-pivot;BSC;RW   Toileting- Clothing Manipulation and Hygiene: +2 for physical assistance;Total assistance;Sit to/from stand               Vision     Perception     Praxis      Pertinent Vitals/Pain Pain Assessment: Faces Faces Pain Scale: Hurts little more Pain Location: R LE Pain Descriptors / Indicators: Guarding;Grimacing (minimizes WB) Pain Intervention(s): Limited activity within patient's tolerance;Monitored during session;Repositioned     Hand Dominance Right   Extremity/Trunk Assessment Upper Extremity Assessment Upper Extremity Assessment: Generalized weakness (full AROM)   Lower Extremity Assessment Lower Extremity Assessment: Defer to PT evaluation       Communication Communication Communication: Prefers language other than [place]   Cognition Arousal/Alertness: Awake/alert Behavior During Therapy: WFL for tasks assessed/performed Overall Cognitive Status: Difficult to assess  General Comments       Exercises       Shoulder Instructions      Home Living Family/patient expects to be discharged to:: Skilled nursing facility Living Arrangements: Children;Spouse/significant other Available Help at Discharge:  Family;Available PRN/intermittently Type of Home: Apartment Home Access: Level entry     Home Layout: One level     Bathroom Shower/Tub: Chief Strategy Officer: Standard     Home Equipment: Shower seat - built in;Shower seat;Grab bars - toilet;Hand held Careers information officer - 2 wheels (02)   Additional Comments: home information from previous admission      Prior Functioning/Environment Level of Independence: Needs assistance  Gait / Transfers Assistance Needed: ambulated within her home with RW ADL's / Homemaking Assistance Needed: Pt reports assistance for LB dressing PTA. Communication / Swallowing Assistance Needed: Pt's primary language in [place], speaks some [place]. Comments: husband unable to assist, also on 02, son works    OT Diagnosis: Generalized weakness;Cognitive deficits;Acute pain   OT Problem List:     OT Treatment/Interventions:      OT Goals(Current goals can be found in the care plan section) Acute Rehab OT Goals Patient Stated Goal: Pt asking for more help at home.  OT Frequency:     Barriers to D/C:            Co-evaluation PT/OT/SLP Co-Evaluation/Treatment: Yes Reason for Co-Treatment: For patient/therapist safety   OT goals addressed during session: ADL's and self-care      End of Session Equipment Utilized During Treatment: Rolling walker;Gait belt;Oxygen Nurse Communication: Mobility status  Activity Tolerance: Patient tolerated treatment well (02 sats remained 93% on 2L) Patient left: in bed;with call bell/phone within reach (for ECHO)   Time: 1030-1055 OT Time Calculation (min): 25 min Charges:  OT General Charges $OT Visit: 1 Procedure OT Evaluation $Initial OT Evaluation Tier I: 1 Procedure G-Codes:    Evern Bio 12/31/2014, 1:03 PM [phone]

## 2015-01-01 DIAGNOSIS — I5043 Acute on chronic combined systolic (congestive) and diastolic (congestive) heart failure: Secondary | ICD-10-CM

## 2015-01-01 DIAGNOSIS — R0602 Shortness of breath: Secondary | ICD-10-CM

## 2015-01-01 DIAGNOSIS — I5021 Acute systolic (congestive) heart failure: Secondary | ICD-10-CM

## 2015-01-01 LAB — BASIC METABOLIC PANEL
ANION GAP: 9 (ref 5–15)
BUN: 21 mg/dL — AB (ref 6–20)
CALCIUM: 8.7 mg/dL — AB (ref 8.9–10.3)
CHLORIDE: 93 mmol/L — AB (ref 101–111)
CO2: 38 mmol/L — AB (ref 22–32)
Creatinine, Ser: 1.09 mg/dL — ABNORMAL HIGH (ref 0.44–1.00)
GFR calc Af Amer: 54 mL/min — ABNORMAL LOW (ref >60)
GFR calc non Af Amer: 47 mL/min — ABNORMAL LOW (ref >60)
Glucose, Bld: 121 mg/dL — ABNORMAL HIGH (ref 65–99)
Potassium: 3.3 mmol/L — ABNORMAL LOW (ref 3.5–5.1)
Sodium: 140 mmol/L (ref 135–145)

## 2015-01-01 LAB — GLUCOSE, CAPILLARY
GLUCOSE-CAPILLARY: 124 mg/dL — AB (ref 65–99)
GLUCOSE-CAPILLARY: 142 mg/dL — AB (ref 65–99)
GLUCOSE-CAPILLARY: 138 mg/dL — AB (ref 65–99)
Glucose-Capillary: 209 mg/dL — ABNORMAL HIGH (ref 65–99)
Glucose-Capillary: 116 mg/dL — ABNORMAL HIGH (ref 65–99)

## 2015-01-01 MED ORDER — POTASSIUM CHLORIDE CRYS ER 20 MEQ PO TBCR
40.0000 meq | EXTENDED_RELEASE_TABLET | Freq: Once | ORAL | Status: AC
  Administered 2015-01-01: 40 meq via ORAL
  Filled 2015-01-01: qty 2

## 2015-01-01 NOTE — Progress Notes (Signed)
[facility]  Patient Status: Active (receiving services up to time of hospitalization)  [facility] is providing the following services: PT and HHA  Would benefit from RN and OT so please order if you agree.  If patient discharges after hours, please call [phone].   [name] 01/01/2015, 9:16 AM

## 2015-01-01 NOTE — Consult Note (Signed)
   [facility] Inpatient Consult   01/01/2015  [name] [birth year] [id number] Patient was assessed for care management services for HF follow up after discharge. Patient was sound asleep.  Patient has history of stroke.  Patient has had 3 hospital admissions/6 months, as well. Spoke with patient's son Anglica Allenbaugh, son, caregiver at via telephone, [facility] verified] [phone] regarding [facility] Care Management services. Explained that [facility] Care Management is a covered benefit of Medicare insurance.   Son states that he is interested in getting his mother some ST SNF care and had spoken with the inpatient social worker regarding his wishes for [employer].  He also states that he is hopeful to continue services with [facility] when she returns home.  Son requested information be left in the patient's room.  Explained care management's role in community follow up and care/disease management, Review information for [facility] Care Management and a brochure was provided with contact information at the patient's bedside.   Explained that [facility] Care Management does not interfere with or replace any services arranged by the inpatient care management staff.  Patient declined services with [facility] Care Management at this time stating, "I just need to see how she does when she returns home from [facility].  Updated inpatient RNCM of the information received from the patient's son.  For further questions, please contact: Charlesetta Shanks, RN BSN CCM [facility]  [phone] business mobile phone

## 2015-01-01 NOTE — Progress Notes (Signed)
Inpatient Diabetes Program Recommendations  AACE/ADA: New Consensus Statement on Inpatient Glycemic Control (2013)  Target Ranges:  Prepandial:   less than 140 mg/dL      Peak postprandial:   less than 180 mg/dL (1-2 hours)      Critically ill patients:  140 - 180 mg/dL   Diabetes history: None Current orders for Inpatient glycemic control: Novolog Moderate TID  Inpatient Diabetes Program Recommendations HgbA1C: A1c 6.8% on 12/30/14. This meets the ADAs criteria for new diagnosis of DM.  If patient is to be newly diagnosed, please inform patient and place a consult in for Dietician consult for diet education.  Thanks,  Christena Deem RN, MSN, [facility] Inpatient Diabetes Coordinator Team Pager [phone]

## 2015-01-01 NOTE — Clinical Social Work Placement (Signed)
   CLINICAL SOCIAL WORK PLACEMENT  NOTE  Date:  01/01/2015  Patient Details  Name: [name] MRN: [id number] Date of Birth: July 08, 1931  Clinical Social Work is seeking post-discharge placement for this patient at the Skilled  Nursing Facility level of care (*CSW will initial, date and re-position this form in  chart as items are completed):  Yes   Patient/family provided with Post Clinical Social Work Department's list of facilities offering this level of care within the geographic area requested by the patient (or if unable, by the patient's family).  Yes   Patient/family informed of their freedom to choose among providers that offer the needed level of care, that participate in Medicare, Medicaid or managed care program needed by the patient, have an available bed and are willing to accept the patient.  Yes   Patient/family informed of [facility]'s ownership interest in [facility] and [facility], as well as of the fact that they are under no obligation to receive care at these facilities.  PASRR submitted to EDS on       PASRR number received on       Existing PASRR number confirmed on 01/01/15     FL2 transmitted to all facilities in geographic area requested by pt/family on 01/01/15     FL2 transmitted to all facilities within larger geographic area on       Patient informed that his/her managed care company has contracts with or will negotiate with certain facilities, including the following:            Patient/family informed of bed offers received.  Patient chooses bed at       Physician recommends and patient chooses bed at      Patient to be transferred to   on  .  Patient to be transferred to facility by       Patient family notified on   of transfer.  Name of family member notified:        PHYSICIAN Please prepare priority discharge summary, including medications, Please sign FL2, Please prepare prescriptions     Additional Comment:     _______________________________________________ Sharol Harness, Theresia Majors [phone]

## 2015-01-01 NOTE — Clinical Social Work Note (Signed)
Clinical Social Work Assessment  Patient Details  Name: [name] MRN: [id number] Date of Birth: [birth year]  Date of referral:  01/01/15               Reason for consult:  Facility Placement                Permission sought to share information with:  Facility Medical sales representative, Family Supports Permission granted to share information::  Yes, Verbal Permission Granted  Name::     Son: [name] , Husband or daughters  Agency::     Relationship::     Contact Information:     Housing/Transportation Living arrangements for the past 2 months:  Single Family Home Source of Information:  Adult Children, Patient Patient Interpreter Needed:  [place] (She can understand some [place] ) Criminal Activity/Legal Involvement Pertinent to Current Situation/Hospitalization:  No - Comment as needed Significant Relationships:  Adult Children, Spouse Lives with:  Spouse (Son live next door; supportive family.) Do you feel safe going back to the place where you live?  Yes Need for family participation in patient care:  Yes (Comment) (Wants her son to be primary decision maker and defers to him)  Care giving concerns:  PT recommends SNF for rehab. Patient indicates agreement to seek SNF as she has been placed in the past. Wants too return home as soon as possible.  Social Worker assessment / plan:  Patient has been placed in the past- most recently at [facility] and prefers to return there. [facility] number in place and son states he would prefer that she return there. He will contact [name], Admissions Director at [facility] to request placement.  Fl2 initiated and placed on chart for MD's signature in place; active bed search initiated. CSW spoke to [name] who indicated that she would offer a bed to patient when stable.  Per MD- hopefully will be stable for d/c on Monday 01/04/15 Employment status:   Retired Health and safety inspector:  Medicare PT Recommendations:  Skilled Nursing  Facility Information / Referral to community resources:  Skilled Nursing Facility  Patient/Family's Response to care:  Patient, who speaks limited English- smiled and seemed happy when told of son's desire for her to return to [facility]. She asked that CSW work with her son to make the final arrangements. Son was noted to be very friendly and cooperative. He states that she received excellent care at the facility.  Patient/Family's Understanding of and Emotional Response to Diagnosis, Current Treatment, and Prognosis:  Patient and son appear to hve a very good understanding of the reason for admission, diagnosis and post d/c needs. Patient has been a fairly recent resident at [facility] and has had a 60 day wellness period for Medicare coverage. Son states that he fully supports need for SNF care and hopes she will return home when stronger.  Emotional Assessment Appearance:  Appears stated age Attitude/Demeanor/Rapport:   (Sleeping soundly but arousable, quiet and cooperative) Affect (typically observed):  Calm, Quiet, Pleasant Orientation:  Oriented to Self, Oriented to Place, Oriented to  Time, Oriented to Situation Alcohol / Substance use:  Never Used Psych involvement (Current and /or in the community):  No (Comment)  Discharge Needs  Concerns to be addressed:  Care Coordination Readmission within the last 30 days:  No Current discharge risk:  None Barriers to Discharge:  No Barriers Identified   Darylene Price, LCSW 01/01/2015, 3:45 PM

## 2015-01-01 NOTE — Progress Notes (Signed)
[facility] PROGRESS NOTE Interim History: 81 y.o. female with history of CAD A Fib Stroke HTN hyperlipidemia presents with increased leg swelling. Patient has a history of CHF. Over the last week she has been having increased swelling of her feet. Patient was seen by the PCP and was told to elevate the legs to help reduce the swelling. Patient now started to have increased SOB also and more fatigued. She has required more help getting around whereas the son states that she normally uses the walker to get around. Patient has been in the hospital previously and has required admission to rehab for severe deconditioning. The son states that she has been progressively declining. In the ED she was noted to have low saturations.   Filed Weights   12/30/14 0128 12/31/14 0447 01/01/15 0730  Weight: 86.546 kg (190 lb 12.8 oz) 81.92 kg (180 lb 9.6 oz) 79.379 kg (175 lb)        Intake/Output Summary (Last 24 hours) at 01/01/15 1305 Last data filed at 01/01/15 1042  Gross per 24 hour  Intake   1075 ml  Output    100 ml  Net    975 ml     Assessment/Plan: 1-Acute on chronic combined systolic and diastolic heart failure -patient with fluid overload on exam; even improved from admission  -mild elevation of troponin and Echo demonstrating significant reduction on her EF (now down to 25%); possible ischemia vs demand ischemia. -will continue diuresis -coreg low dose on board; will continue lisinopril -cardiology consulted, will follow rec's on need for invasive workup -continue daily weights and low sodium diet -Strict intake and output -patient denies CP  2-HLD (hyperlipidemia): continue zocor  3-Atrial fibrillation: rate control -will continue eliquis -patient also on coreg low dose now (well tolerated)  4-Essential hypertension: soft, but stable -will monitor and hold medications as needed  5-depression: continue sertraline  6-GERD: continue PPI  7-hyperthyroidism: continue  current dose of methimazole -TSH WNL   Code Status: Full Family Communication: no family at bedside Disposition Plan: to be determine, most likely SNF at discharge for rehab   Consultants:  Cardiology   Procedures: ECHO:  - Left ventricle: Diffuse hypokinesis with mid and basal inferior akinesis The cavity size was severely dilated. Wall thickness was increased in a pattern of mild LVH. There was mild focal basal and mild concentric hypertrophy of the septum. The estimated ejection fraction was 25%. - Mitral valve: There was moderate regurgitation. - Left atrium: The atrium was severely dilated. - Right ventricle: The cavity size was mildly dilated. - Right atrium: The atrium was mildly dilated. - Atrial septum: No defect or patent foramen ovale was identified. - Tricuspid valve: There was moderate regurgitation. - Pulmonary arteries: PA peak pressure: 34 mm Hg (S).  Antibiotics:  None   HPI/Subjective: Afebrile,breathing continue improving. No CP.  Objective: Filed Vitals:   12/31/14 2050 01/01/15 0603 01/01/15 0730 01/01/15 1044  BP: 101/71 116/80 109/73 117/80  Pulse: 61 74 62 64  Temp: 97.9 F (36.6 C) 97.9 F (36.6 C) 98.2 F (36.8 C)   TempSrc: Oral Oral Oral   Resp: 18 18 18    Height:      Weight:   79.379 kg (175 lb)   SpO2: 100% 93% 92%      Exam:  General: in no acute distress. Denies CP and reports breathing continue improving. No fever. No requiring oxygen supplementation currently.  HEENT: No bruits, no goiter. No JVD appreciated due to body habitus Heart:  no rubs, no gallops, rate controlled Lungs: positive crackles at bases (still present, but better), no wheezing, improve air movement  Abdomen: Soft, nontender, nondistended, positive bowel sounds.  Neuro: Grossly intact, nonfocal. Extremities: 2++ edema bilaterally   Data Reviewed: Basic Metabolic Panel:  Recent Labs Lab 12/29/14 2249 12/31/14 0318 01/01/15 0346  NA 139 140  140  K 3.6 3.4* 3.3*  CL 100* 95* 93*  CO2 28 37* 38*  GLUCOSE 128* 130* 121*  BUN 25* 20 21*  CREATININE 0.96 1.14* 1.09*  CALCIUM 8.8* 8.7* 8.7*   CBC:  Recent Labs Lab 12/29/14 2249  WBC 6.8  NEUTROABS 4.3  HGB 13.4  HCT 42.2  MCV 86.1  PLT 218   Cardiac Enzymes:  Recent Labs Lab 12/29/14 2249 12/30/14 0418 12/30/14 1925 12/31/14 0318  TROPONINI 0.06* 0.07* 0.07* 0.08*   BNP (last 3 results)  Recent Labs  12/29/14 2249  BNP 716.0*   CBG:  Recent Labs Lab 12/31/14 1114 12/31/14 1647 12/31/14 2114 01/01/15 0601 01/01/15 1145  GLUCAP 128* 99 124* 142* 209*    Studies: No results found.  Scheduled Meds: . apixaban  5 mg Oral BID  . calcium-vitamin D  1 tablet Oral BID WC  . carvedilol  3.125 mg Oral BID WC  . furosemide  40 mg Intravenous BID  . insulin aspart  0-15 Units Subcutaneous TID WC  . lisinopril  5 mg Oral Daily  . methimazole  5 mg Oral Once per day on Mon Wed Fri  . pantoprazole  40 mg Oral Q0600  . sertraline  50 mg Oral Daily  . simvastatin  40 mg Oral q1800  . sodium chloride  3 mL Intravenous Q12H  . vitamin C  500 mg Oral Daily   Continuous Infusions:   Time: 35 minutes  Vassie Loll  [facility] Pager [phone]. If 7PM-7AM, please contact night-coverage at [email or web address], password [facility] 01/01/2015, 1:05 PM  LOS: 2 days

## 2015-01-01 NOTE — Progress Notes (Signed)
Patient Profile: 81 y/o female, followed by Dr. Tenny Craw, with h/o PAF on Eliquis, prior stroke and symptomatic bradycardia s/p PMM admitted with new onset systolic CHF,with dyspnea and volume overload. She had a 2D echo 07/2014 which demonstrated low normal LVF with EF of 50%. However, repeat echo 7/14 showed new reduction in EF to severe range of 25%.  Subjective: Patient's primary language is not [place]. She speaks [place] and no interpreter present, though she understands a bit and able to respond to some basic questions. She reports that she is breathing ok. Does not appear to be in any distress and breathing is not labored. No use of supplemental O2. Denies any pain.   Objective: Vital signs in last 24 hours: Temp:  [97.9 F (36.6 C)-98.2 F (36.8 C)] 98.2 F (36.8 C) (07/15 0730) Pulse Rate:  [61-79] 64 (07/15 1044) Resp:  [18] 18 (07/15 0730) BP: (92-117)/(66-80) 117/80 mmHg (07/15 1044) SpO2:  [92 %-100 %] 92 % (07/15 0730) Weight:  [175 lb (79.379 kg)] 175 lb (79.379 kg) (07/15 0730) Last BM Date: 12/29/14  Intake/Output from previous day: 07/14 0701 - 07/15 0700 In: 835 [P.O.:835] Out: 100 [Urine:100] Intake/Output this shift: Total I/O In: 535 [P.O.:535] Out: -   Medications Current Facility-Administered Medications  Medication Dose Route Frequency Provider Last Rate Last Dose  . 0.9 %  sodium chloride infusion  250 mL Intravenous PRN Yevonne Pax, MD      . acetaminophen (TYLENOL) tablet 650 mg  650 mg Oral Q4H PRN Yevonne Pax, MD   650 mg at 12/31/14 1212  . apixaban (ELIQUIS) tablet 5 mg  5 mg Oral BID Yevonne Pax, MD   5 mg at 01/01/15 1042  . calcium-vitamin D (OSCAL WITH D) 500-200 MG-UNIT per tablet 1 tablet  1 tablet Oral BID WC Vassie Loll, MD   1 tablet at 01/01/15 1042  . carvedilol (COREG) tablet 3.125 mg  3.125 mg Oral BID WC Vassie Loll, MD   3.125 mg at 01/01/15 1045  . furosemide (LASIX) injection 40 mg  40 mg Intravenous BID Vassie Loll,  MD   40 mg at 01/01/15 1043  . insulin aspart (novoLOG) injection 0-15 Units  0-15 Units Subcutaneous TID WC Yevonne Pax, MD   2 Units at 01/01/15 [phone]  . lisinopril (PRINIVIL,ZESTRIL) tablet 5 mg  5 mg Oral Daily Yevonne Pax, MD   5 mg at 01/01/15 1045  . methimazole (TAPAZOLE) tablet 5 mg  5 mg Oral Once per day on Mon Wed Fri Yevonne Pax, MD   5 mg at 01/01/15 1042  . nitroGLYCERIN (NITROSTAT) SL tablet 0.4 mg  0.4 mg Sublingual Q5 min PRN Yevonne Pax, MD      . ondansetron [facility]) injection 4 mg  4 mg Intravenous Q6H PRN Yevonne Pax, MD      . pantoprazole (PROTONIX) EC tablet 40 mg  40 mg Oral Q0600 Yevonne Pax, MD   40 mg at 01/01/15 0535  . polyethylene glycol (MIRALAX / GLYCOLAX) packet 17 g  17 g Oral Daily PRN Yevonne Pax, MD      . sertraline (ZOLOFT) tablet 50 mg  50 mg Oral Daily Yevonne Pax, MD   50 mg at 01/01/15 1042  . simvastatin (ZOCOR) tablet 40 mg  40 mg Oral q1800 Yevonne Pax, MD   40 mg at 12/31/14 1747  . sodium chloride 0.9 % injection 3 mL  3 mL Intravenous Q12H  Yevonne Pax, MD   3 mL at 01/01/15 1046  . sodium chloride 0.9 % injection 3 mL  3 mL Intravenous PRN Yevonne Pax, MD      . vitamin C (ASCORBIC ACID) tablet 500 mg  500 mg Oral Daily Yevonne Pax, MD   500 mg at 01/01/15 1042  . zolpidem (AMBIEN) tablet 5 mg  5 mg Oral QHS PRN Yevonne Pax, MD        PE: General appearance: alert, cooperative and no distress Neck: no carotid bruit and no JVD Lungs: bibasilar rales Heart: regular rate and rhythm, S1, S2 normal, no murmur, click, rub or gallop Extremities: 2+ bilateral pitting LEE, with compression stockings Pulses: 2+ and symmetric Skin: warm and dry Neurologic: Grossly normal  Lab Results:   Recent Labs  12/29/14 2249  WBC 6.8  HGB 13.4  HCT 42.2  PLT 218   BMET  Recent Labs  12/29/14 2249 12/31/14 0318 01/01/15 0346  NA 139 140 140  K 3.6 3.4* 3.3*  CL 100* 95* 93*  CO2 28 37* 38*  GLUCOSE 128* 130* 121*  BUN  25* 20 21*  CREATININE 0.96 1.14* 1.09*  CALCIUM 8.8* 8.7* 8.7*   PT/INR  Recent Labs  12/29/14 2250  LABPROT 21.5*  INR 1.88*   Cardiac Panel (last 3 results)  Recent Labs  12/30/14 0418 12/30/14 1925 12/31/14 0318  TROPONINI 0.07* 0.07* 0.08*    Studies/Results: 2D echo 12/31/14 Study Conclusions  - Left ventricle: Diffuse hypokinesis with mid and basal inferior akinesis The cavity size was severely dilated. Wall thickness was increased in a pattern of mild LVH. There was mild focal basal and mild concentric hypertrophy of the septum. The estimated ejection fraction was 25%. - Mitral valve: There was moderate regurgitation. - Left atrium: The atrium was severely dilated. - Right ventricle: The cavity size was mildly dilated. - Right atrium: The atrium was mildly dilated. - Atrial septum: No defect or patent foramen ovale was identified. - Tricuspid valve: There was moderate regurgitation. - Pulmonary arteries: PA peak pressure: 34 mm Hg (S).   Assessment/Plan  Active Problems:   Essential hypertension   Atrial fibrillation   HLD (hyperlipidemia)   Acute systolic heart failure   CHF (congestive heart failure)   Acute on chronic diastolic heart failure   1. Acute on chronic systolic CHF with new onset DCM of unknown etiology: EF now 25%. ? Progression of CAD. She has apparently had progressive functional decline so will need to discuss goals of care with family before deciding on further ischemic w/u. For now, continue IV diuretic therapy for volume overload (continued bibasilar rales and 2+ bilateral pitting LEE). Unsure if I/Os are accurate. Will check with RN to ensure I/Os are being properly recorded to help guide therapy. She may need a foley. Continue daily weights and low sodium diet. Continue BB and ACE-I. Monitor renal function and electrolytes.   2. Mildly elevated troponin with flat trend: 0.07, 0.07, 0.08. Most likely related to CHF with demand  ischemia.   3. PAF:  maintaining a paced rhythm on BB.  Continue Eliquis for a/c.  4. Hyperthyroidism:  on methimazole - TSH ok  5. Hypokalemia: K is 3.3. Supplemental K given earlier this am. F/u BMP in the am.   LOS: 2 days    Brittainy M. Sharol Harness, PA-C 01/01/2015 11:10 AM  Patinet seen and examined  Agree with findings as noted by B Simmons above  I know pt from  clinic  She does not have any family memebers present On exam evid of volume increase  This is new for her  LVEF is down, again new for her. WIll review echo. Agree with medical Rx.  She needs diuresis. Overall her acitvity is very limited.   Dietrich Pates

## 2015-01-01 NOTE — Care Management Important Message (Signed)
Important Message  Patient Details  Name: [name] MRN: [id number] Date of Birth: [birth year]   Medicare Important Message Given:  Yes-second notification given    Yvonna Alanis 01/01/2015, 11:37 AM

## 2015-01-02 DIAGNOSIS — I5043 Acute on chronic combined systolic (congestive) and diastolic (congestive) heart failure: Principal | ICD-10-CM

## 2015-01-02 DIAGNOSIS — R0602 Shortness of breath: Secondary | ICD-10-CM

## 2015-01-02 DIAGNOSIS — I482 Chronic atrial fibrillation: Secondary | ICD-10-CM

## 2015-01-02 DIAGNOSIS — R778 Other specified abnormalities of plasma proteins: Secondary | ICD-10-CM

## 2015-01-02 DIAGNOSIS — E785 Hyperlipidemia, unspecified: Secondary | ICD-10-CM

## 2015-01-02 DIAGNOSIS — R7989 Other specified abnormal findings of blood chemistry: Secondary | ICD-10-CM

## 2015-01-02 LAB — BASIC METABOLIC PANEL
Anion gap: 10 (ref 5–15)
BUN: 20 mg/dL (ref 6–20)
CHLORIDE: 92 mmol/L — AB (ref 101–111)
CO2: 37 mmol/L — AB (ref 22–32)
Calcium: 8.7 mg/dL — ABNORMAL LOW (ref 8.9–10.3)
Creatinine, Ser: 0.96 mg/dL (ref 0.44–1.00)
GFR calc non Af Amer: 54 mL/min — ABNORMAL LOW (ref >60)
Glucose, Bld: 130 mg/dL — ABNORMAL HIGH (ref 65–99)
Potassium: 3.2 mmol/L — ABNORMAL LOW (ref 3.5–5.1)
Sodium: 139 mmol/L (ref 135–145)

## 2015-01-02 LAB — GLUCOSE, CAPILLARY
GLUCOSE-CAPILLARY: 126 mg/dL — AB (ref 65–99)
Glucose-Capillary: 130 mg/dL — ABNORMAL HIGH (ref 65–99)
Glucose-Capillary: 151 mg/dL — ABNORMAL HIGH (ref 65–99)
Glucose-Capillary: 141 mg/dL — ABNORMAL HIGH (ref 65–99)

## 2015-01-02 MED ORDER — POTASSIUM CHLORIDE CRYS ER 20 MEQ PO TBCR
40.0000 meq | EXTENDED_RELEASE_TABLET | Freq: Once | ORAL | Status: AC
  Administered 2015-01-02: 40 meq via ORAL

## 2015-01-02 NOTE — Progress Notes (Signed)
[facility] PROGRESS NOTE Interim History: 81 y.o. female with history of CAD A Fib Stroke HTN hyperlipidemia presents with increased leg swelling. Patient has a history of CHF. Over the last week she has been having increased swelling of her feet. Patient was seen by the PCP and was told to elevate the legs to help reduce the swelling. Patient now started to have increased SOB also and more fatigued. She has required more help getting around whereas the son states that she normally uses the walker to get around. Patient has been in the hospital previously and has required admission to rehab for severe deconditioning. The son states that she has been progressively declining. In the ED she was noted to have low saturations.   Filed Weights   12/31/14 0447 01/01/15 0730 01/02/15 8921  Weight: 81.92 kg (180 lb 9.6 oz) 79.379 kg (175 lb) 79.303 kg (174 lb 13.3 oz)        Intake/Output Summary (Last 24 hours) at 01/02/15 1559 Last data filed at 01/02/15 1300  Gross per 24 hour  Intake    540 ml  Output      1 ml  Net    539 ml     Assessment/Plan: 1-Acute on chronic combined systolic and diastolic heart failure -patient still with fluid overload on exam; even improved from admission  -mild elevation of troponin and Echo demonstrating significant reduction on her EF (now down to 25%); possible ischemia vs demand ischemia. -will continue diuresis IV, with potential PO transition in the next 1-2 days -coreg low dose on board; will continue lisinopril as well -cardiology consulted, will follow rec's on need for invasive workup. Plan is for potential at least diagnostic cath 7/18 or 7/19 -continue daily weights and low sodium diet -Strict intake and output -patient denies CP  2-HLD (hyperlipidemia): continue zocor  3-Atrial fibrillation: rate control -will continue eliquis -patient also on coreg low dose now (well tolerated)  4-Essential hypertension: soft, but stable -will  monitor and hold medications as needed -follow VS  5-depression: continue sertraline  6-GERD: continue PPI  7-hyperthyroidism: continue current dose of methimazole -TSH WNL   Code Status: Full Family Communication: no family at bedside Disposition Plan: to be determine, most likely SNF at discharge for rehab   Consultants:  Cardiology   Procedures: ECHO:  - Left ventricle: Diffuse hypokinesis with mid and basal inferior akinesis The cavity size was severely dilated. Wall thickness was increased in a pattern of mild LVH. There was mild focal basal and mild concentric hypertrophy of the septum. The estimated ejection fraction was 25%. - Mitral valve: There was moderate regurgitation. - Left atrium: The atrium was severely dilated. - Right ventricle: The cavity size was mildly dilated. - Right atrium: The atrium was mildly dilated. - Atrial septum: No defect or patent foramen ovale was identified. - Tricuspid valve: There was moderate regurgitation. - Pulmonary arteries: PA peak pressure: 34 mm Hg (S).  Antibiotics:  None   HPI/Subjective: Afebrile,breathing continue improving/stable. Patient denies CP, nausea, vomiting and diaphoresis. Still with fluid overload on exam (but improved from admission)  Objective: Filed Vitals:   01/01/15 2000 01/01/15 2100 01/02/15 0632 01/02/15 1300  BP:  102/73 105/73 110/84  Pulse:  64 68 72  Temp:  98.3 F (36.8 C) 98.5 F (36.9 C) 98.1 F (36.7 C)  TempSrc:  Oral Oral Oral  Resp: 18 18 20 20   Height:      Weight:   79.303 kg (174 lb 13.3 oz)  SpO2:  96% 98% 98%     Exam:  General: in no acute distress. Denies CP and reports breathing improved/stable. No requiring oxygen supplementation currently. Still with LE edema up to 2++ and bibasilar crackles  HEENT: No bruits, no goiter. No JVD appreciated due to body habitus Heart: no rubs, no gallops, rate controlled Lungs: positive crackles at bases (still present, but  less accentuated), no wheezing, improve air movement overall  Abdomen: Soft, nontender, nondistended, positive bowel sounds.  Neuro: Grossly intact, nonfocal. Extremities: 2++ edema bilaterally; TED hoses in place   Data Reviewed: Basic Metabolic Panel:  Recent Labs Lab 12/29/14 2249 12/31/14 0318 01/01/15 0346 01/02/15 0305  NA 139 140 140 139  K 3.6 3.4* 3.3* 3.2*  CL 100* 95* 93* 92*  CO2 28 37* 38* 37*  GLUCOSE 128* 130* 121* 130*  BUN 25* 20 21* 20  CREATININE 0.96 1.14* 1.09* 0.96  CALCIUM 8.8* 8.7* 8.7* 8.7*   CBC:  Recent Labs Lab 12/29/14 2249  WBC 6.8  NEUTROABS 4.3  HGB 13.4  HCT 42.2  MCV 86.1  PLT 218   Cardiac Enzymes:  Recent Labs Lab 12/29/14 2249 12/30/14 0418 12/30/14 1925 12/31/14 0318  TROPONINI 0.06* 0.07* 0.07* 0.08*   BNP (last 3 results)  Recent Labs  12/29/14 2249  BNP 716.0*   CBG:  Recent Labs Lab 01/01/15 1145 01/01/15 1641 01/01/15 2130 01/02/15 0605 01/02/15 1135  GLUCAP 209* 116* 138* 130* 151*    Studies: No results found.  Scheduled Meds: . apixaban  5 mg Oral BID  . calcium-vitamin D  1 tablet Oral BID WC  . carvedilol  3.125 mg Oral BID WC  . furosemide  40 mg Intravenous BID  . insulin aspart  0-15 Units Subcutaneous TID WC  . lisinopril  5 mg Oral Daily  . methimazole  5 mg Oral Once per day on Mon Wed Fri  . pantoprazole  40 mg Oral Q0600  . sertraline  50 mg Oral Daily  . simvastatin  40 mg Oral q1800  . sodium chloride  3 mL Intravenous Q12H  . vitamin C  500 mg Oral Daily   Continuous Infusions:   Time: 35 minutes  Vassie Loll  [facility] Pager [phone]. If 7PM-7AM, please contact night-coverage at [email or web address], password [facility] 01/02/2015, 3:59 PM  LOS: 3 days

## 2015-01-02 NOTE — Progress Notes (Signed)
Patient Profile: 81 y/o female, followed by Dr. Tenny Craw, with h/o PAF on Eliquis, prior stroke and symptomatic bradycardia s/p PMM admitted with new onset systolic CHF,with dyspnea and volume overload. She had a 2D echo 07/2014 which demonstrated low normal LVF with EF of 50%. However, repeat echo 7/14 showed new reduction in EF to severe range of 25%.  Subjective: Breathing fairly. I's/O's positive overnight, however, may not be accurately recording output. Weight is down 1 lb to 174 lbs. On lasix 40 mg IV BID.   Objective: Vital signs in last 24 hours: Temp:  [98.3 F (36.8 C)-98.5 F (36.9 C)] 98.5 F (36.9 C) (07/16 1610) Pulse Rate:  [64-69] 68 (07/16 0632) Resp:  [18-20] 20 (07/16 9604) BP: (96-105)/(73-79) 105/73 mmHg (07/16 0632) SpO2:  [96 %-98 %] 98 % (07/16 5409) Weight:  [174 lb 13.3 oz (79.303 kg)] 174 lb 13.3 oz (79.303 kg) (07/16 8119) Last BM Date: 12/29/14  Intake/Output from previous day: 07/15 0701 - 07/16 0700 In: 835 [P.O.:835] Out: 0  Intake/Output this shift:    Medications Current Facility-Administered Medications  Medication Dose Route Frequency Provider Last Rate Last Dose  . 0.9 %  sodium chloride infusion  250 mL Intravenous PRN Yevonne Pax, MD      . acetaminophen (TYLENOL) tablet 650 mg  650 mg Oral Q4H PRN Yevonne Pax, MD   650 mg at 01/01/15 1728  . apixaban (ELIQUIS) tablet 5 mg  5 mg Oral BID Yevonne Pax, MD   5 mg at 01/02/15 1000  . calcium-vitamin D (OSCAL WITH D) 500-200 MG-UNIT per tablet 1 tablet  1 tablet Oral BID WC Vassie Loll, MD   1 tablet at 01/02/15 1049  . carvedilol (COREG) tablet 3.125 mg  3.125 mg Oral BID WC Vassie Loll, MD   3.125 mg at 01/02/15 1049  . furosemide (LASIX) injection 40 mg  40 mg Intravenous BID Vassie Loll, MD   40 mg at 01/02/15 1050  . insulin aspart (novoLOG) injection 0-15 Units  0-15 Units Subcutaneous TID WC Yevonne Pax, MD   2 Units at 01/02/15 [phone]  . lisinopril (PRINIVIL,ZESTRIL) tablet 5  mg  5 mg Oral Daily Yevonne Pax, MD   5 mg at 01/02/15 1050  . methimazole (TAPAZOLE) tablet 5 mg  5 mg Oral Once per day on Mon Wed Fri Yevonne Pax, MD   5 mg at 01/01/15 1042  . nitroGLYCERIN (NITROSTAT) SL tablet 0.4 mg  0.4 mg Sublingual Q5 min PRN Yevonne Pax, MD      . ondansetron [facility]) injection 4 mg  4 mg Intravenous Q6H PRN Yevonne Pax, MD      . pantoprazole (PROTONIX) EC tablet 40 mg  40 mg Oral Q0600 Yevonne Pax, MD   40 mg at 01/02/15 0519  . polyethylene glycol (MIRALAX / GLYCOLAX) packet 17 g  17 g Oral Daily PRN Yevonne Pax, MD      . sertraline (ZOLOFT) tablet 50 mg  50 mg Oral Daily Yevonne Pax, MD   50 mg at 01/02/15 1050  . simvastatin (ZOCOR) tablet 40 mg  40 mg Oral q1800 Yevonne Pax, MD   40 mg at 01/01/15 1723  . sodium chloride 0.9 % injection 3 mL  3 mL Intravenous Q12H Yevonne Pax, MD   3 mL at 01/02/15 1000  . sodium chloride 0.9 % injection 3 mL  3 mL Intravenous PRN Yevonne Pax, MD      .  vitamin C (ASCORBIC ACID) tablet 500 mg  500 mg Oral Daily Yevonne Pax, MD   500 mg at 01/02/15 1000  . zolpidem (AMBIEN) tablet 5 mg  5 mg Oral QHS PRN Yevonne Pax, MD        PE: General appearance: alert, cooperative and no distress Neck: no carotid bruit and no JVD Lungs: bibasilar rales Heart: regular rate and rhythm, S1, S2 normal, no murmur, click, rub or gallop Extremities: 2+ bilateral pitting LEE, with compression stockings Pulses: 2+ and symmetric Skin: warm and dry Neurologic: Grossly normal  Lab Results:  No results for input(s): WBC, HGB, HCT, PLT in the last 72 hours. BMET  Recent Labs  12/31/14 0318 01/01/15 0346 01/02/15 0305  NA 140 140 139  K 3.4* 3.3* 3.2*  CL 95* 93* 92*  CO2 37* 38* 37*  GLUCOSE 130* 121* 130*  BUN 20 21* 20  CREATININE 1.14* 1.09* 0.96  CALCIUM 8.7* 8.7* 8.7*   PT/INR No results for input(s): LABPROT, INR in the last 72 hours. Cardiac Panel (last 3 results)  Recent Labs  12/30/14 1925  12/31/14 0318  TROPONINI 0.07* 0.08*    Studies/Results: 2D echo 12/31/14 Study Conclusions  - Left ventricle: Diffuse hypokinesis with mid and basal inferior akinesis The cavity size was severely dilated. Wall thickness was increased in a pattern of mild LVH. There was mild focal basal and mild concentric hypertrophy of the septum. The estimated ejection fraction was 25%. - Mitral valve: There was moderate regurgitation. - Left atrium: The atrium was severely dilated. - Right ventricle: The cavity size was mildly dilated. - Right atrium: The atrium was mildly dilated. - Atrial septum: No defect or patent foramen ovale was identified. - Tricuspid valve: There was moderate regurgitation. - Pulmonary arteries: PA peak pressure: 34 mm Hg (S).   Assessment/Plan  Active Problems:   Essential hypertension   Atrial fibrillation   HLD (hyperlipidemia)   Acute systolic heart failure   CHF (congestive heart failure)   Acute on chronic diastolic heart failure   SOB (shortness of breath)   Acute on chronic combined systolic and diastolic CHF (congestive heart failure)   1. Acute on chronic systolic CHF with new onset DCM of unknown etiology: EF now 25%. ? Progression of CAD. She has apparently had progressive functional decline so will need to discuss goals of care with family before deciding on further ischemic w/u. For now, continue IV diuretic therapy for volume overload (continued bibasilar rales and 2+ bilateral pitting LEE). Unsure if I/Os are accurate.   - continue IV lasix today, probably switch to PO Lasix tomorrow  - check BNP, BMP and magnesium tomorrow  2. Mildly elevated troponin with flat trend: 0.07, 0.07, 0.08. Most likely related to CHF with demand ischemia.   - long discussion with son and daughter today about LHC, seem to be leaning to at least a diagnostic cath on Monday or Tuesday next week.  3. PAF:  maintaining a paced rhythm on BB.  Continue Eliquis  for a/c.  4. Hyperthyroidism:  on methimazole - TSH ok  5. Hypokalemia: K is 3.2 today. Replete today. Check magnesium.  Chrystie Nose, MD, [facility] Attending Cardiologist [facility]   LOS: 3 days  01/02/2015 11:45 AM

## 2015-01-02 NOTE — Discharge Instructions (Signed)

## 2015-01-03 DIAGNOSIS — I481 Persistent atrial fibrillation: Secondary | ICD-10-CM

## 2015-01-03 DIAGNOSIS — R7989 Other specified abnormal findings of blood chemistry: Secondary | ICD-10-CM

## 2015-01-03 LAB — BRAIN NATRIURETIC PEPTIDE: B NATRIURETIC PEPTIDE 5: 1027.9 pg/mL — AB (ref 0.0–100.0)

## 2015-01-03 LAB — GLUCOSE, CAPILLARY
GLUCOSE-CAPILLARY: 125 mg/dL — AB (ref 65–99)
Glucose-Capillary: 130 mg/dL — ABNORMAL HIGH (ref 65–99)
Glucose-Capillary: 166 mg/dL — ABNORMAL HIGH (ref 65–99)
Glucose-Capillary: 151 mg/dL — ABNORMAL HIGH (ref 65–99)

## 2015-01-03 LAB — BASIC METABOLIC PANEL
ANION GAP: 8 (ref 5–15)
BUN: 21 mg/dL — AB (ref 6–20)
CALCIUM: 8.7 mg/dL — AB (ref 8.9–10.3)
CHLORIDE: 93 mmol/L — AB (ref 101–111)
CO2: 38 mmol/L — ABNORMAL HIGH (ref 22–32)
CREATININE: 1 mg/dL (ref 0.44–1.00)
GFR calc Af Amer: >60 mL/min (ref >60)
GFR, EST NON AFRICAN AMERICAN: 52 mL/min — AB (ref >60)
Glucose, Bld: 130 mg/dL — ABNORMAL HIGH (ref 65–99)
Potassium: 3.4 mmol/L — ABNORMAL LOW (ref 3.5–5.1)
Sodium: 139 mmol/L (ref 135–145)

## 2015-01-03 LAB — MAGNESIUM: Magnesium: 2.2 mg/dL (ref 1.7–2.4)

## 2015-01-03 MED ORDER — ASPIRIN 81 MG PO CHEW
81.0000 mg | CHEWABLE_TABLET | ORAL | Status: AC
  Administered 2015-01-04: 81 mg via ORAL
  Filled 2015-01-03: qty 1

## 2015-01-03 MED ORDER — SODIUM CHLORIDE 0.9 % IJ SOLN
3.0000 mL | Freq: Two times a day (BID) | INTRAMUSCULAR | Status: DC
  Administered 2015-01-03 – 2015-01-05 (×4): 3 mL via INTRAVENOUS

## 2015-01-03 MED ORDER — SODIUM CHLORIDE 0.9 % IJ SOLN: 3.0000 mL | INTRAMUSCULAR | Status: DC | PRN

## 2015-01-03 MED ORDER — FUROSEMIDE 40 MG PO TABS
40.0000 mg | ORAL_TABLET | Freq: Two times a day (BID) | ORAL | Status: DC
  Administered 2015-01-03 – 2015-01-07 (×9): 40 mg via ORAL
  Filled 2015-01-03 (×11): qty 1

## 2015-01-03 MED ORDER — SODIUM CHLORIDE 0.9 % IV SOLN
INTRAVENOUS | Status: DC
  Administered 2015-01-04: 06:00:00 via INTRAVENOUS

## 2015-01-03 MED ORDER — SODIUM CHLORIDE 0.9 % IV SOLN: 250.0000 mL | INTRAVENOUS | Status: DC | PRN

## 2015-01-03 NOTE — Progress Notes (Signed)
Patient Profile: 81 y/o female, followed by Dr. Tenny Craw, with h/o PAF on Eliquis, prior stroke and symptomatic bradycardia s/p PMM admitted with new onset systolic CHF,with dyspnea and volume overload. She had a 2D echo 07/2014 which demonstrated low normal LVF with EF of 50%. However, repeat echo 7/14 showed new reduction in EF to severe range of 25%.  Subjective: Sleepy today - aroused for exam. Diuresed more overnight.  I's/O's positive overnight, however, weight is down to 172 lbs. On lasix 40 mg IV BID. Discussion with family about possible catheterization on Monday - they want to tentatively arrange it.  Objective: Vital signs in last 24 hours: Temp:  [97.2 F (36.2 C)-98.1 F (36.7 C)] 97.2 F (36.2 C) (07/17 0555) Pulse Rate:  [72-82] 82 (07/17 0555) Resp:  [18-20] 18 (07/17 0555) BP: (110-139)/(77-90) 139/90 mmHg (07/17 0555) SpO2:  [98 %] 98 % (07/17 0555) Weight:  [172 lb 13.5 oz (78.4 kg)] 172 lb 13.5 oz (78.4 kg) (07/17 0555) Last BM Date: 12/29/14  Intake/Output from previous day: 07/16 0701 - 07/17 0700 In: 360 [P.O.:360] Out: 1 [Urine:1] Intake/Output this shift:    Medications Current Facility-Administered Medications  Medication Dose Route Frequency Provider Last Rate Last Dose  . 0.9 %  sodium chloride infusion  250 mL Intravenous PRN Yevonne Pax, MD      . acetaminophen (TYLENOL) tablet 650 mg  650 mg Oral Q4H PRN Yevonne Pax, MD   650 mg at 01/03/15 6553  . apixaban (ELIQUIS) tablet 5 mg  5 mg Oral BID Yevonne Pax, MD   5 mg at 01/02/15 2147  . calcium-vitamin D (OSCAL WITH D) 500-200 MG-UNIT per tablet 1 tablet  1 tablet Oral BID WC Vassie Loll, MD   1 tablet at 01/02/15 1630  . carvedilol (COREG) tablet 3.125 mg  3.125 mg Oral BID WC Vassie Loll, MD   3.125 mg at 01/02/15 1630  . furosemide (LASIX) injection 40 mg  40 mg Intravenous BID Vassie Loll, MD   40 mg at 01/02/15 1800  . insulin aspart (novoLOG) injection 0-15 Units  0-15 Units  Subcutaneous TID WC Yevonne Pax, MD   2 Units at 01/03/15 [phone]  . lisinopril (PRINIVIL,ZESTRIL) tablet 5 mg  5 mg Oral Daily Yevonne Pax, MD   5 mg at 01/02/15 1050  . methimazole (TAPAZOLE) tablet 5 mg  5 mg Oral Once per day on Mon Wed Fri Yevonne Pax, MD   5 mg at 01/01/15 1042  . nitroGLYCERIN (NITROSTAT) SL tablet 0.4 mg  0.4 mg Sublingual Q5 min PRN Yevonne Pax, MD      . ondansetron [facility]) injection 4 mg  4 mg Intravenous Q6H PRN Yevonne Pax, MD      . pantoprazole (PROTONIX) EC tablet 40 mg  40 mg Oral Q0600 Yevonne Pax, MD   40 mg at 01/03/15 0510  . polyethylene glycol (MIRALAX / GLYCOLAX) packet 17 g  17 g Oral Daily PRN Yevonne Pax, MD      . sertraline (ZOLOFT) tablet 50 mg  50 mg Oral Daily Yevonne Pax, MD   50 mg at 01/02/15 1050  . simvastatin (ZOCOR) tablet 40 mg  40 mg Oral q1800 Yevonne Pax, MD   40 mg at 01/02/15 1805  . sodium chloride 0.9 % injection 3 mL  3 mL Intravenous Q12H Yevonne Pax, MD   3 mL at 01/02/15 2148  . sodium chloride 0.9 %  injection 3 mL  3 mL Intravenous PRN Yevonne Pax, MD      . vitamin C (ASCORBIC ACID) tablet 500 mg  500 mg Oral Daily Yevonne Pax, MD   500 mg at 01/02/15 1000  . zolpidem (AMBIEN) tablet 5 mg  5 mg Oral QHS PRN Yevonne Pax, MD        PE: General appearance: alert, cooperative and no distress Neck: no carotid bruit and no JVD Lungs: bibasilar rales Heart: regular rate and rhythm, S1, S2 normal, no murmur, click, rub or gallop Extremities: 2+ bilateral pitting LEE, with compression stockings Pulses: 2+ and symmetric Skin: warm and dry Neurologic: Grossly normal  Lab Results:  No results for input(s): WBC, HGB, HCT, PLT in the last 72 hours. BMET  Recent Labs  01/01/15 0346 01/02/15 0305 01/03/15 0353  NA 140 139 139  K 3.3* 3.2* 3.4*  CL 93* 92* 93*  CO2 38* 37* 38*  GLUCOSE 121* 130* 130*  BUN 21* 20 21*  CREATININE 1.09* 0.96 1.00  CALCIUM 8.7* 8.7* 8.7*   PT/INR No results for  input(s): LABPROT, INR in the last 72 hours. Cardiac Panel (last 3 results) No results for input(s): CKTOTAL, CKMB, TROPONINI, RELINDX in the last 72 hours.  Studies/Results: 2D echo 12/31/14 Study Conclusions  - Left ventricle: Diffuse hypokinesis with mid and basal inferior akinesis The cavity size was severely dilated. Wall thickness was increased in a pattern of mild LVH. There was mild focal basal and mild concentric hypertrophy of the septum. The estimated ejection fraction was 25%. - Mitral valve: There was moderate regurgitation. - Left atrium: The atrium was severely dilated. - Right ventricle: The cavity size was mildly dilated. - Right atrium: The atrium was mildly dilated. - Atrial septum: No defect or patent foramen ovale was identified. - Tricuspid valve: There was moderate regurgitation. - Pulmonary arteries: PA peak pressure: 34 mm Hg (S).   Assessment/Plan  Active Problems:   Essential hypertension   Atrial fibrillation   HLD (hyperlipidemia)   Acute systolic heart failure   CHF (congestive heart failure)   Acute on chronic diastolic heart failure   SOB (shortness of breath)   Acute on chronic combined systolic and diastolic CHF (congestive heart failure)   Elevated troponin   1. Acute on chronic systolic CHF with new onset DCM of unknown etiology: EF now 25%. ? Progression of CAD. She has apparently had progressive functional decline so will need to discuss goals of care with family before deciding on further ischemic w/u. For now, continue IV diuretic therapy for volume overload (continued bibasilar rales and 2+ bilateral pitting LEE). Unsure if I/Os are accurate.   - switch to po lasix today.  - BNP elevated at 1027, creatinine stable. Magnesium at goal.   2. Mildly elevated troponin with flat trend: 0.07, 0.07, 0.08. Most likely related to CHF with demand ischemia.   - long discussion with son and daughter today about LHC, seem to be leaning to  at least a diagnostic cath   on Monday or Tuesday next week.  3. PAF:  maintaining a paced rhythm on BB.  Continue Eliquis for a/c.  4. Hyperthyroidism:  on methimazole - TSH ok  5. Hypokalemia: K is 3.4 today. Replete today.  NPO p MN for possible left heart cath tomorrow.  Chrystie Nose, MD, [facility] Attending Cardiologist [facility]   LOS: 4 days  01/03/2015 8:17 AM

## 2015-01-03 NOTE — Progress Notes (Signed)
[facility] PROGRESS NOTE Interim History: 81 y.o. female with history of CAD A Fib Stroke HTN hyperlipidemia presents with increased leg swelling. Patient has a history of CHF. Over the last week she has been having increased swelling of her feet. Patient was seen by the PCP and was told to elevate the legs to help reduce the swelling. Patient now started to have increased SOB also and more fatigued. She has required more help getting around whereas the son states that she normally uses the walker to get around. Patient has been in the hospital previously and has required admission to rehab for severe deconditioning. The son states that she has been progressively declining. In the ED she was noted to have low saturations.   Filed Weights   01/01/15 0730 01/02/15 8676 01/03/15 0555  Weight: 79.379 kg (175 lb) 79.303 kg (174 lb 13.3 oz) 78.4 kg (172 lb 13.5 oz)        Intake/Output Summary (Last 24 hours) at 01/03/15 1624 Last data filed at 01/03/15 1300  Gross per 24 hour  Intake    480 ml  Output      0 ml  Net    480 ml     Assessment/Plan: 1-Acute on chronic combined systolic and diastolic heart failure -patient still with fluid overload on exam; even improved from admission  -mild elevation of troponin and Echo demonstrating significant reduction on her EF (now down to 25%); possible ischemia vs demand ischemia. -will continue diuresis IV, with potential PO transition in the next 1-2 days -coreg low dose on board; will continue lisinopril as well -cardiology consulted, will follow rec's on need for invasive workup. Plan is for potential at least diagnostic cath 7/18 or 7/19 -continue daily weights and low sodium diet -Strict intake and output -patient denies CP  2-HLD (hyperlipidemia): continue zocor  3-Atrial fibrillation: rate control -will continue eliquis -patient also on coreg low dose now (well tolerated)  4-Essential hypertension: soft, but stable -will  monitor and hold medications as needed -follow VS  5-depression: continue sertraline  6-GERD: continue PPI  7-hyperthyroidism: continue current dose of methimazole -TSH WNL   Code Status: Full Family Communication: no family at bedside Disposition Plan: to be determine, most likely SNF at discharge for rehab   Consultants:  Cardiology   Procedures: ECHO:  - Left ventricle: Diffuse hypokinesis with mid and basal inferior akinesis The cavity size was severely dilated. Wall thickness was increased in a pattern of mild LVH. There was mild focal basal and mild concentric hypertrophy of the septum. The estimated ejection fraction was 25%. - Mitral valve: There was moderate regurgitation. - Left atrium: The atrium was severely dilated. - Right ventricle: The cavity size was mildly dilated. - Right atrium: The atrium was mildly dilated. - Atrial septum: No defect or patent foramen ovale was identified. - Tricuspid valve: There was moderate regurgitation. - Pulmonary arteries: PA peak pressure: 34 mm Hg (S).  Antibiotics:  None   HPI/Subjective: Afebrile,breathing continue improving/stable. Patient denies CP, nausea, vomiting and diaphoresis. Still with fluid overload on exam (but significant improved from admission). Family will like diagnostic cath (hopefully in am tomorrow 7/18)  Objective: Filed Vitals:   01/02/15 1300 01/02/15 2050 01/03/15 0555 01/03/15 1300  BP: 110/84 118/77 139/90 95/61  Pulse: 72 76 82 66  Temp: 98.1 F (36.7 C) 98 F (36.7 C) 97.2 F (36.2 C) 98 F (36.7 C)  TempSrc: Oral Oral Oral Oral  Resp: 20 18 18 18   Height:  Weight:   78.4 kg (172 lb 13.5 oz)   SpO2: 98% 98% 98% 95%     Exam:  General: in no acute distress. Denies CP and reports breathing improved/stable. LE edema improved and now 1++; no frank crackles   HEENT: No bruits, no goiter. No JVD appreciated due to body habitus Heart: no rubs, no gallops, rate  controlled Lungs: no frank crackles on exam; but still with decrease BS at bases, no wheezing, improve air movement overall  Abdomen: Soft, nontender, nondistended, positive bowel sounds.  Neuro: Grossly intact, nonfocal. Extremities: 1++ edema bilaterally; TED hoses in place   Data Reviewed: Basic Metabolic Panel:  Recent Labs Lab 12/29/14 2249 12/31/14 0318 01/01/15 0346 01/02/15 0305 01/03/15 0353  NA 139 140 140 139 139  K 3.6 3.4* 3.3* 3.2* 3.4*  CL 100* 95* 93* 92* 93*  CO2 28 37* 38* 37* 38*  GLUCOSE 128* 130* 121* 130* 130*  BUN 25* 20 21* 20 21*  CREATININE 0.96 1.14* 1.09* 0.96 1.00  CALCIUM 8.8* 8.7* 8.7* 8.7* 8.7*  MG  --   --   --   --  2.2   CBC:  Recent Labs Lab 12/29/14 2249  WBC 6.8  NEUTROABS 4.3  HGB 13.4  HCT 42.2  MCV 86.1  PLT 218   Cardiac Enzymes:  Recent Labs Lab 12/29/14 2249 12/30/14 0418 12/30/14 1925 12/31/14 0318  TROPONINI 0.06* 0.07* 0.07* 0.08*   BNP (last 3 results)  Recent Labs  12/29/14 2249 01/03/15 0353  BNP 716.0* 1027.9*   CBG:  Recent Labs Lab 01/02/15 1135 01/02/15 1641 01/02/15 2119 01/03/15 0612 01/03/15 1218  GLUCAP 151* 141* 126* 130* 166*    Studies: No results found.  Scheduled Meds: . apixaban  5 mg Oral BID  . calcium-vitamin D  1 tablet Oral BID WC  . carvedilol  3.125 mg Oral BID WC  . furosemide  40 mg Oral BID  . insulin aspart  0-15 Units Subcutaneous TID WC  . lisinopril  5 mg Oral Daily  . methimazole  5 mg Oral Once per day on Mon Wed Fri  . pantoprazole  40 mg Oral Q0600  . sertraline  50 mg Oral Daily  . simvastatin  40 mg Oral q1800  . sodium chloride  3 mL Intravenous Q12H  . vitamin C  500 mg Oral Daily   Continuous Infusions:   Time: 35 minutes  Vassie Loll  [facility] Pager [phone]. If 7PM-7AM, please contact night-coverage at [email or web address], password [facility] 01/03/2015, 4:24 PM  LOS: 4 days

## 2015-01-04 DIAGNOSIS — R600 Localized edema: Secondary | ICD-10-CM | POA: Insufficient documentation

## 2015-01-04 DIAGNOSIS — R609 Edema, unspecified: Secondary | ICD-10-CM

## 2015-01-04 LAB — GLUCOSE, CAPILLARY
Glucose-Capillary: 115 mg/dL — ABNORMAL HIGH (ref 65–99)
Glucose-Capillary: 127 mg/dL — ABNORMAL HIGH (ref 65–99)
Glucose-Capillary: 145 mg/dL — ABNORMAL HIGH (ref 65–99)
Glucose-Capillary: 183 mg/dL — ABNORMAL HIGH (ref 65–99)

## 2015-01-04 MED ORDER — NITROGLYCERIN 0.4 MG SL SUBL: 0.4000 mg | SUBLINGUAL_TABLET | SUBLINGUAL | Status: DC | PRN

## 2015-01-04 NOTE — Progress Notes (Signed)
Per MD note- cath deferred until Wednesday.  Message left for [facility]- Admission at [facility] re: above.  Also spoke to patient's son Ed to provide update.  Lorri Frederick. Jaci Lazier, [place] [phone]

## 2015-01-04 NOTE — Progress Notes (Signed)
PT Cancellation Note  Patient Details Name: ALYRIA SWEIGERT MRN: [id number] DOB: 25-Jun-1931   Cancelled Treatment:    Reason Eval/Treat Not Completed: Patient declined, no reason specified. Pt finishing lunch and husband says "not today".  Pt with earlier episode of chest pain within the last hour which nursing confirms.  Will check back tomorrow. Pt scheduled for heart cath Wednesday.   [place] Guitron LUBECK 01/04/2015, 12:54 PM

## 2015-01-04 NOTE — Progress Notes (Signed)
BP 93/58;  HR 65.  Dr. Gwenlyn Perking states to go ahead and give scheduled coreg 3.125.

## 2015-01-04 NOTE — Progress Notes (Signed)
No change in d/c plan at this time. When medically stable-  Plan is for d/c to [facility] for rehab. Possible heart cath today per nursing.  Will continue to monitor. Above information updated with Tresa Endo- Admissions at [facility].  Lorri Frederick. Jaci Lazier, [place] [phone]

## 2015-01-04 NOTE — Progress Notes (Signed)
Patient Profile: 81 y/o female, followed by Dr. Tenny Craw, with h/o PAF on Eliquis, prior stroke and symptomatic bradycardia s/p PMM admitted with new onset systolic CHF,with dyspnea and volume overload. She had a 2D echo 07/2014 which demonstrated low normal LVF with EF of 50%. However, repeat echo 7/14 showed new reduction in EF to severe range of 25%.  Subjective: Diuresing well - unfortunately she has been on Eliquis which we are holding today. Will defer cath until Wednesday.  Objective: Vital signs in last 24 hours: Temp:  [98 F (36.7 C)-98.4 F (36.9 C)] 98.4 F (36.9 C) (07/18 0601) Pulse Rate:  [62-69] 64 (07/18 0833) Resp:  [18] 18 (07/18 0601) BP: (95-126)/(61-79) 126/79 mmHg (07/18 0833) SpO2:  [95 %-100 %] 100 % (07/18 0601) Weight:  [171 lb 11.5 oz (77.891 kg)] 171 lb 11.5 oz (77.891 kg) (07/18 0601) Last BM Date: 12/29/14  Intake/Output from previous day: 07/17 0701 - 07/18 0700 In: 600 [P.O.:600] Out: 0  Intake/Output this shift: Total I/O In: 60 [P.O.:60] Out: -   Medications Current Facility-Administered Medications  Medication Dose Route Frequency Provider Last Rate Last Dose  . 0.9 %  sodium chloride infusion  250 mL Intravenous PRN Yevonne Pax, MD      . 0.9 %  sodium chloride infusion  250 mL Intravenous PRN Chrystie Nose, MD      . 0.9 %  sodium chloride infusion   Intravenous Continuous Chrystie Nose, MD 10 mL/hr at 01/04/15 0544    . acetaminophen (TYLENOL) tablet 650 mg  650 mg Oral Q4H PRN Yevonne Pax, MD   650 mg at 01/03/15 5400  . calcium-vitamin D (OSCAL WITH D) 500-200 MG-UNIT per tablet 1 tablet  1 tablet Oral BID WC Vassie Loll, MD   1 tablet at 01/03/15 1630  . carvedilol (COREG) tablet 3.125 mg  3.125 mg Oral BID WC Vassie Loll, MD   3.125 mg at 01/04/15 8676  . furosemide (LASIX) tablet 40 mg  40 mg Oral BID Chrystie Nose, MD   40 mg at 01/04/15 1950  . insulin aspart (novoLOG) injection 0-15 Units  0-15 Units Subcutaneous TID  WC Yevonne Pax, MD   Stopped at 01/04/15 [phone]  . lisinopril (PRINIVIL,ZESTRIL) tablet 5 mg  5 mg Oral Daily Yevonne Pax, MD   5 mg at 01/04/15 1000  . methimazole (TAPAZOLE) tablet 5 mg  5 mg Oral Once per day on Mon Wed Fri Yevonne Pax, MD   5 mg at 01/04/15 7124  . nitroGLYCERIN (NITROSTAT) SL tablet 0.4 mg  0.4 mg Sublingual Q5 min PRN Yevonne Pax, MD      . ondansetron [facility]) injection 4 mg  4 mg Intravenous Q6H PRN Yevonne Pax, MD      . pantoprazole (PROTONIX) EC tablet 40 mg  40 mg Oral Q0600 Yevonne Pax, MD   40 mg at 01/04/15 0503  . polyethylene glycol (MIRALAX / GLYCOLAX) packet 17 g  17 g Oral Daily PRN Yevonne Pax, MD      . sertraline (ZOLOFT) tablet 50 mg  50 mg Oral Daily Yevonne Pax, MD   50 mg at 01/04/15 1000  . simvastatin (ZOCOR) tablet 40 mg  40 mg Oral q1800 Yevonne Pax, MD   40 mg at 01/03/15 1801  . sodium chloride 0.9 % injection 3 mL  3 mL Intravenous Q12H Yevonne Pax, MD   3 mL at 01/04/15 1002  .  sodium chloride 0.9 % injection 3 mL  3 mL Intravenous PRN Yevonne Pax, MD      . sodium chloride 0.9 % injection 3 mL  3 mL Intravenous Q12H Chrystie Nose, MD   3 mL at 01/04/15 1001  . sodium chloride 0.9 % injection 3 mL  3 mL Intravenous PRN Chrystie Nose, MD      . vitamin C (ASCORBIC ACID) tablet 500 mg  500 mg Oral Daily Yevonne Pax, MD   Stopped at 01/04/15 1001  . zolpidem (AMBIEN) tablet 5 mg  5 mg Oral QHS PRN Yevonne Pax, MD        PE: General appearance: alert, cooperative and no distress Neck: no carotid bruit and no JVD Lungs: bibasilar rales Heart: regular rate and rhythm, S1, S2 normal, no murmur, click, rub or gallop Extremities: 2+ bilateral pitting LEE, with compression stockings Pulses: 2+ and symmetric Skin: warm and dry Neurologic: Grossly normal  Lab Results:  No results for input(s): WBC, HGB, HCT, PLT in the last 72 hours. BMET  Recent Labs  01/02/15 0305 01/03/15 0353  NA 139 139  K 3.2* 3.4*  CL 92*  93*  CO2 37* 38*  GLUCOSE 130* 130*  BUN 20 21*  CREATININE 0.96 1.00  CALCIUM 8.7* 8.7*   PT/INR No results for input(s): LABPROT, INR in the last 72 hours. Cardiac Panel (last 3 results) No results for input(s): CKTOTAL, CKMB, TROPONINI, RELINDX in the last 72 hours.  Studies/Results: 2D echo 12/31/14 Study Conclusions  - Left ventricle: Diffuse hypokinesis with mid and basal inferior akinesis The cavity size was severely dilated. Wall thickness was increased in a pattern of mild LVH. There was mild focal basal and mild concentric hypertrophy of the septum. The estimated ejection fraction was 25%. - Mitral valve: There was moderate regurgitation. - Left atrium: The atrium was severely dilated. - Right ventricle: The cavity size was mildly dilated. - Right atrium: The atrium was mildly dilated. - Atrial septum: No defect or patent foramen ovale was identified. - Tricuspid valve: There was moderate regurgitation. - Pulmonary arteries: PA peak pressure: 34 mm Hg (S).   Assessment/Plan  Active Problems:   Essential hypertension   Atrial fibrillation   HLD (hyperlipidemia)   Acute systolic heart failure   CHF (congestive heart failure)   Acute on chronic diastolic heart failure   SOB (shortness of breath)   Acute on chronic combined systolic and diastolic CHF (congestive heart failure)   Elevated troponin   1. Acute on chronic systolic CHF with new onset DCM of unknown etiology: EF now 25%. ? Progression of CAD. She has apparently had progressive functional decline so will need to discuss goals of care with family before deciding on further ischemic w/u. For now, continue IV diuretic therapy for volume overload (continued bibasilar rales and 2+ bilateral pitting LEE). Unsure if I/Os are accurate.   - Continue po lasix 40 mg BID  - BNP elevated at 1027, creatinine stable. Magnesium at goal.   2. Mildly elevated troponin with flat trend: 0.07, 0.07, 0.08. Most  likely related to CHF with demand ischemia.   - long discussion with son and daughter today about LHC, seem to be leaning to at least a diagnostic cath   on Wednesday-  Will need to hold Eliquis.  3. PAF:  maintaining a paced rhythm on BB.  Hold Eliquis for LHC.  4. Hyperthyroidism:  on methimazole - TSH ok  5. Hypokalemia: K is  3.4 today. Replete today.  Will arrange for left heart cath Wednesday.  Chrystie Nose, MD, [facility] Attending Cardiologist [facility]   LOS: 5 days  01/04/2015 11:32 AM

## 2015-01-04 NOTE — Progress Notes (Addendum)
[facility] PROGRESS NOTE Interim History: 81 y.o. female with history of CAD A Fib Stroke HTN hyperlipidemia presents with increased leg swelling. Patient has a history of CHF. Over the last week she has been having increased swelling of her feet. Patient was seen by the PCP and was told to elevate the legs to help reduce the swelling. Patient now started to have increased SOB also and more fatigued. She has required more help getting around whereas the son states that she normally uses the walker to get around. Patient has been in the hospital previously and has required admission to rehab for severe deconditioning. The son states that she has been progressively declining. In the ED she was noted to have low saturations.   Filed Weights   01/02/15 0632 01/03/15 0555 01/04/15 0601  Weight: 79.303 kg (174 lb 13.3 oz) 78.4 kg (172 lb 13.5 oz) 77.891 kg (171 lb 11.5 oz)        Intake/Output Summary (Last 24 hours) at 01/04/15 1117 Last data filed at 01/04/15 1000  Gross per 24 hour  Intake    540 ml  Output      0 ml  Net    540 ml     Assessment/Plan: 1-Acute on chronic combined systolic and diastolic heart failure -patient still with fluid overload on exam; even improved from admission  -mild elevation of troponin and Echo demonstrating significant reduction on her EF (now down to 25%); possible ischemia vs demand ischemia. -will continue lasix, now PO. Still with good diuresis -coreg low dose on board; will continue lisinopril as well -cardiology consulted, will follow rec's on need for invasive workup. Plan is for potential at least diagnostic cath on 7/20; needs today to flush eliquis  -continue daily weights and low sodium diet -Strict intake and output -patient denies CP  2-HLD (hyperlipidemia): continue zocor  3-Atrial fibrillation: rate control -will continue eliquis -patient also on coreg low dose now (well tolerated)  4-Essential hypertension: soft, but  stable -will monitor and hold medications as needed -follow VS  5-depression: continue sertraline  6-GERD: continue PPI  7-hyperthyroidism: continue current dose of methimazole -TSH WNL   Code Status: Full Family Communication: no family at bedside Disposition Plan: to be determine, most likely SNF at discharge for rehab   Consultants:  Cardiology   Procedures: ECHO:  - Left ventricle: Diffuse hypokinesis with mid and basal inferior akinesis The cavity size was severely dilated. Wall thickness was increased in a pattern of mild LVH. There was mild focal basal and mild concentric hypertrophy of the septum. The estimated ejection fraction was 25%. - Mitral valve: There was moderate regurgitation. - Left atrium: The atrium was severely dilated. - Right ventricle: The cavity size was mildly dilated. - Right atrium: The atrium was mildly dilated. - Atrial septum: No defect or patent foramen ovale was identified. - Tricuspid valve: There was moderate regurgitation. - Pulmonary arteries: PA peak pressure: 34 mm Hg (S).  Antibiotics:  None   HPI/Subjective: Afebrile,breathing continue improving/stable. No overnight complaints   Objective: Filed Vitals:   01/03/15 1300 01/03/15 2133 01/04/15 0601 01/04/15 0833  BP: 95/61 104/70 110/76 126/79  Pulse: 66 69 62 64  Temp: 98 F (36.7 C) 98 F (36.7 C) 98.4 F (36.9 C)   TempSrc: Oral Oral Oral   Resp: Height:      Weight:   77.891 kg (171 lb 11.5 oz)   SpO2: 95% 98% 100%  Exam:  General: in no acute distress. Denies CP and reports breathing is stable. LE edema improving    HEENT: No bruits, no goiter. No JVD appreciated due to body habitus Heart: no rubs, no gallops, rate controlled Lungs: no frank crackles on exam; but still with decrease BS at bases, no wheezing, improve air movement overall  Abdomen: Soft, nontender, nondistended, positive bowel sounds.  Neuro: Grossly intact,  nonfocal. Extremities: 1++ edema bilaterally; TED hoses in place   Data Reviewed: Basic Metabolic Panel:  Recent Labs Lab 12/29/14 2249 12/31/14 0318 01/01/15 0346 01/02/15 0305 01/03/15 0353  NA 139 140 140 139 139  K 3.6 3.4* 3.3* 3.2* 3.4*  CL 100* 95* 93* 92* 93*  CO2 28 37* 38* 37* 38*  GLUCOSE 128* 130* 121* 130* 130*  BUN 25* 20 21* 20 21*  CREATININE 0.96 1.14* 1.09* 0.96 1.00  CALCIUM 8.8* 8.7* 8.7* 8.7* 8.7*  MG  --   --   --   --  2.2   CBC:  Recent Labs Lab 12/29/14 2249  WBC 6.8  NEUTROABS 4.3  HGB 13.4  HCT 42.2  MCV 86.1  PLT 218   Cardiac Enzymes:  Recent Labs Lab 12/29/14 2249 12/30/14 0418 12/30/14 1925 12/31/14 0318  TROPONINI 0.06* 0.07* 0.07* 0.08*   BNP (last 3 results)  Recent Labs  12/29/14 2249 01/03/15 0353  BNP 716.0* 1027.9*   CBG:  Recent Labs Lab 01/03/15 0612 01/03/15 1218 01/03/15 1648 01/03/15 2135 01/04/15 0557  GLUCAP 130* 166* 151* 125* 115*    Studies: No results found.  Scheduled Meds: . apixaban  5 mg Oral BID  . calcium-vitamin D  1 tablet Oral BID WC  . carvedilol  3.125 mg Oral BID WC  . furosemide  40 mg Oral BID  . insulin aspart  0-15 Units Subcutaneous TID WC  . lisinopril  5 mg Oral Daily  . methimazole  5 mg Oral Once per day on Mon Wed Fri  . pantoprazole  40 mg Oral Q0600  . sertraline  50 mg Oral Daily  . simvastatin  40 mg Oral q1800  . sodium chloride  3 mL Intravenous Q12H  . sodium chloride  3 mL Intravenous Q12H  . vitamin C  500 mg Oral Daily   Continuous Infusions: . sodium chloride 10 mL/hr at 01/04/15 0544    Time: 35 minutes  Vassie Loll  [facility] Pager [phone]. If 7PM-7AM, please contact night-coverage at [email or web address], password [facility] 01/04/2015, 11:17 AM  LOS: 5 days

## 2015-01-04 NOTE — Care Management Important Message (Signed)
Important Message  Patient Details  Name: [name] MRN: [id number] Date of Birth: Apr 02, 1932   Medicare Important Message Given:  Yes-third notification given    Yvonna Alanis 01/04/2015, 2:54 PM

## 2015-01-04 NOTE — Progress Notes (Signed)
Dr. Rennis Golden called, stating while he was in pt's room, she c/o chest pain, and asked if I would assess and admin Nitro as ordered.  At 1147,pt c/o chest pain 7/10. Pt was given Nitro 0.04 SL tab. After 5 min, chest pain was still 7/10.  Gave 2nd Nitro. At 1202,chest pain was still 7/10. Gave 3rd Nitro.  The final nitro tab relieved her chest pain to 2/10.  EKG (unconfirmed) done and placed in shadow chart, A-paced rhythm.  VSS.

## 2015-01-05 DIAGNOSIS — I5020 Unspecified systolic (congestive) heart failure: Secondary | ICD-10-CM

## 2015-01-05 LAB — GLUCOSE, CAPILLARY
GLUCOSE-CAPILLARY: 180 mg/dL — AB (ref 65–99)
GLUCOSE-CAPILLARY: 158 mg/dL — AB (ref 65–99)
Glucose-Capillary: 125 mg/dL — ABNORMAL HIGH (ref 65–99)
Glucose-Capillary: 133 mg/dL — ABNORMAL HIGH (ref 65–99)

## 2015-01-05 LAB — CBC
HCT: 41.6 % (ref 36.0–46.0)
HEMOGLOBIN: 13.1 g/dL (ref 12.0–15.0)
MCH: 27.3 pg (ref 26.0–34.0)
MCHC: 31.5 g/dL (ref 30.0–36.0)
MCV: 86.8 fL (ref 78.0–100.0)
Platelets: 219 10*3/uL (ref 150–400)
RBC: 4.79 MIL/uL (ref 3.87–5.11)
RDW: 15.5 % (ref 11.5–15.5)
WBC: 7.1 10*3/uL (ref 4.0–10.5)

## 2015-01-05 LAB — PROTIME-INR
INR: 1.47 (ref 0.00–1.49)
Prothrombin Time: 17.9 seconds — ABNORMAL HIGH (ref 11.6–15.2)

## 2015-01-05 MED ORDER — ASPIRIN 81 MG PO CHEW
81.0000 mg | CHEWABLE_TABLET | ORAL | Status: AC
  Administered 2015-01-06: 81 mg via ORAL
  Filled 2015-01-05: qty 1

## 2015-01-05 MED ORDER — SODIUM CHLORIDE 0.9 % IV SOLN
INTRAVENOUS | Status: DC
  Administered 2015-01-06: 06:00:00 via INTRAVENOUS

## 2015-01-05 MED ORDER — SODIUM CHLORIDE 0.9 % IJ SOLN: 3.0000 mL | INTRAMUSCULAR | Status: DC | PRN

## 2015-01-05 MED ORDER — SODIUM CHLORIDE 0.9 % IV SOLN: 250.0000 mL | INTRAVENOUS | Status: DC | PRN

## 2015-01-05 MED ORDER — SODIUM CHLORIDE 0.9 % IJ SOLN
3.0000 mL | Freq: Two times a day (BID) | INTRAMUSCULAR | Status: DC
  Administered 2015-01-05: 3 mL via INTRAVENOUS

## 2015-01-05 NOTE — Progress Notes (Signed)
PT Cancellation Note  Patient Details Name: [name] MRN: [id number] DOB: 1931/06/29   Cancelled Treatment:    Reason Eval/Treat Not Completed: Patient declined, no reason specified (pt stating "I'm sick" pt will not attempt mobility or even exercises. Spouse present and stated she would frequently refuse HHPT as well. Will attempt at later date. Educated pt and spouse for need to move to improve strength. Aware and still denied P.T.)   Toney Sang [facility] 01/05/2015, 11:33 AM Delaney Meigs, PT [phone]

## 2015-01-05 NOTE — Progress Notes (Signed)
Patient Profile: 81 y/o female, followed by Dr. Tenny Craw, with h/o PAF on Eliquis, prior stroke and symptomatic bradycardia s/p PMM admitted with new onset systolic CHF,with dyspnea and volume overload. She had a 2D echo 07/2014 which demonstrated low normal LVF with EF of 50%. However, repeat echo 7/14 showed new reduction in EF to severe range of 25%.  Subjective: No complaints. Breathing ok on [place]. No chest pain. Family by beside.   Objective: Vital signs in last 24 hours: Temp:  [97.8 F (36.6 C)-98.1 F (36.7 C)] 97.8 F (36.6 C) (07/19 0537) Pulse Rate:  [62-69] 63 (07/19 1018) Resp:  [18] 18 (07/19 0537) BP: (93-116)/(49-75) 113/74 mmHg (07/19 1018) SpO2:  [98 %-100 %] 98 % (07/19 0537) Weight:  [168 lb 3.2 oz (76.295 kg)] 168 lb 3.2 oz (76.295 kg) (07/19 0537) Last BM Date: 01/04/15  Intake/Output from previous day: 07/18 0701 - 07/19 0700 In: 927.7 [P.O.:675; I.V.:252.7] Out: -  Intake/Output this shift: Total I/O In: 395 [P.O.:395] Out: -   Medications Current Facility-Administered Medications  Medication Dose Route Frequency Provider Last Rate Last Dose  . 0.9 %  sodium chloride infusion  250 mL Intravenous PRN Yevonne Pax, MD      . 0.9 %  sodium chloride infusion  250 mL Intravenous PRN Chrystie Nose, MD      . 0.9 %  sodium chloride infusion   Intravenous Continuous Chrystie Nose, MD 10 mL/hr at 01/04/15 0544    . acetaminophen (TYLENOL) tablet 650 mg  650 mg Oral Q4H PRN Yevonne Pax, MD   650 mg at 01/03/15 5956  . calcium-vitamin D (OSCAL WITH D) 500-200 MG-UNIT per tablet 1 tablet  1 tablet Oral BID WC Vassie Loll, MD   1 tablet at 01/05/15 1018  . carvedilol (COREG) tablet 3.125 mg  3.125 mg Oral BID WC Vassie Loll, MD   3.125 mg at 01/05/15 1019  . furosemide (LASIX) tablet 40 mg  40 mg Oral BID Chrystie Nose, MD   40 mg at 01/05/15 1017  . insulin aspart (novoLOG) injection 0-15 Units  0-15 Units Subcutaneous TID WC Yevonne Pax, MD   2 Units  at 01/05/15 [phone]  . lisinopril (PRINIVIL,ZESTRIL) tablet 5 mg  5 mg Oral Daily Yevonne Pax, MD   5 mg at 01/05/15 1019  . methimazole (TAPAZOLE) tablet 5 mg  5 mg Oral Once per day on Mon Wed Fri Yevonne Pax, MD   5 mg at 01/04/15 6433  . nitroGLYCERIN (NITROSTAT) SL tablet 0.4 mg  0.4 mg Sublingual Q5 min PRN Yevonne Pax, MD   0.4 mg at 01/04/15 1202  . ondansetron (ZOFRAN) injection 4 mg  4 mg Intravenous Q6H PRN Yevonne Pax, MD      . pantoprazole (PROTONIX) EC tablet 40 mg  40 mg Oral Q0600 Yevonne Pax, MD   40 mg at 01/05/15 1018  . polyethylene glycol (MIRALAX / GLYCOLAX) packet 17 g  17 g Oral Daily PRN Yevonne Pax, MD      . sertraline (ZOLOFT) tablet 50 mg  50 mg Oral Daily Yevonne Pax, MD   50 mg at 01/05/15 1017  . simvastatin (ZOCOR) tablet 40 mg  40 mg Oral q1800 Yevonne Pax, MD   40 mg at 01/04/15 1626  . sodium chloride 0.9 % injection 3 mL  3 mL Intravenous Q12H Yevonne Pax, MD   3 mL at 01/05/15 1020  .  sodium chloride 0.9 % injection 3 mL  3 mL Intravenous PRN Saadat A Khan, MD      . sodium chloride 0.9 % injection 3 mL  3 mL Intravenous Q12H Kenneth C Hilty, MD   3 mL at 01/05/15 1019  . sodium chloride 0.9 % injection 3 mL  3 mL Intravenous PRN Kenneth C Hilty, MD      . vitamin C (ASCORBIC ACID) tablet 500 mg  500 mg Oral Daily Saadat A Khan, MD   500 mg at 01/05/15 1017  . zolpidem (AMBIEN) tablet 5 mg  5 mg Oral QHS PRN Saadat A Khan, MD   5 mg at 01/04/15 2351    PE: General appearance: alert, cooperative and no distress Neck: no carotid bruit and no JVD Lungs: bibasilar rales Heart: regular rate and rhythm, S1, S2 normal, no murmur, click, rub or gallop Extremities: no LEE, with compression stockings Pulses: 2+ and symmetric Skin: warm and dry Neurologic: Grossly normal  Lab Results:  No results for input(s): WBC, HGB, HCT, PLT in the last 72 hours. BMET  Recent Labs  01/03/15 0353  NA 139  K 3.4*  CL 93*  CO2 38*  GLUCOSE 130*  BUN  21*  CREATININE 1.00  CALCIUM 8.7*   PT/INR No results for input(s): LABPROT, INR in the last 72 hours. Cardiac Panel (last 3 results) No results for input(s): CKTOTAL, CKMB, TROPONINI, RELINDX in the last 72 hours.  Studies/Results: 2D echo 12/31/14 Study Conclusions  - Left ventricle: Diffuse hypokinesis with mid and basal inferior akinesis The cavity size was severely dilated. Wall thickness was increased in a pattern of mild LVH. There was mild focal basal and mild concentric hypertrophy of the septum. The estimated ejection fraction was 25%. - Mitral valve: There was moderate regurgitation. - Left atrium: The atrium was severely dilated. - Right ventricle: The cavity size was mildly dilated. - Right atrium: The atrium was mildly dilated. - Atrial septum: No defect or patent foramen ovale was identified. - Tricuspid valve: There was moderate regurgitation. - Pulmonary arteries: PA peak pressure: 34 mm Hg (S).   Assessment/Plan  Active Problems:   Essential hypertension   Atrial fibrillation   HLD (hyperlipidemia)   Acute systolic heart failure   CHF (congestive heart failure)   Acute on chronic diastolic heart failure   SOB (shortness of breath)   Acute on chronic combined systolic and diastolic CHF (congestive heart failure)   Elevated troponin   Peripheral edema   1. Acute on chronic systolic CHF with new onset DCM of unknown etiology: EF now 25%. ? Progression of CAD. Plan for LHC tomorrow to assess. Volume status significanlty improved however I/Os not accurate as output not being recorded.    - Continue po lasix 40 mg BID  -  Continue BB and ACE-I for LV dysfunction.   2. Mildly elevated troponin with flat trend: 0.07, 0.07, 0.08. Most likely related to CHF with demand ischemia.However with new LV dysfunction, plan for cath tomorrow to r/o CAD.     3. PAF:  maintaining a paced rhythm on BB.  Hold Eliquis for LHC.  4. Hyperthyroidism:  on  methimazole - TSH ok   Elin Fenley, PA-C  01/05/2015   LOS: 6 days  01/05/2015 11:44 AM 

## 2015-01-05 NOTE — Progress Notes (Signed)
[facility] PROGRESS NOTE Interim History: 81 y.o. female with history of CAD A Fib Stroke HTN hyperlipidemia presents with increased leg swelling. Patient has a history of CHF. Over the last week she has been having increased swelling of her feet. Patient was seen by the PCP and was told to elevate the legs to help reduce the swelling. Patient now started to have increased SOB also and more fatigued. She has required more help getting around whereas the son states that she normally uses the walker to get around. Patient has been in the hospital previously and has required admission to rehab for severe deconditioning. The son states that she has been progressively declining. In the ED she was noted to have low saturations. Cath on 7/20. Adequate diuresis and with good heart failure drugs on board.   Filed Weights   01/03/15 0555 01/04/15 0601 01/05/15 0537  Weight: 78.4 kg (172 lb 13.5 oz) 77.891 kg (171 lb 11.5 oz) 76.295 kg (168 lb 3.2 oz)        Intake/Output Summary (Last 24 hours) at 01/05/15 1704 Last data filed at 01/05/15 1253  Gross per 24 hour  Intake    875 ml  Output      0 ml  Net    875 ml     Assessment/Plan: 1-Acute on chronic combined systolic and diastolic heart failure -patient still with fluid overload on exam; even improved from admission  -mild elevation of troponin and Echo demonstrating significant reduction on her EF (now down to 25%); possible ischemia vs demand ischemia. -will continue lasix, now PO. Still with good diuresis -coreg low dose on board; will continue lisinopril as well -cardiology consulted, will follow rec's on need for invasive workup. Plan is for potential at least diagnostic cath on 7/20; needs two days to flush eliquis  -continue daily weights and low sodium diet -Strict intake and output -patient denies CP  2-HLD (hyperlipidemia): continue zocor  3-Atrial fibrillation: rate control -will continue eliquis (after  cath) -patient also on coreg low dose now (well tolerated) -cardiology on board will follow rec's  4-Essential hypertension: soft, but stable -will monitor and hold medications as needed -follow VS  5-depression: continue sertraline  6-GERD: continue PPI  7-hyperthyroidism: continue current dose of methimazole -TSH WNL   Code Status: Full Family Communication: no family at bedside Disposition Plan: to be determine, most likely SNF at discharge for rehab   Consultants:  Cardiology   Procedures: ECHO:  - Left ventricle: Diffuse hypokinesis with mid and basal inferior akinesis The cavity size was severely dilated. Wall thickness was increased in a pattern of mild LVH. There was mild focal basal and mild concentric hypertrophy of the septum. The estimated ejection fraction was 25%. - Mitral valve: There was moderate regurgitation. - Left atrium: The atrium was severely dilated. - Right ventricle: The cavity size was mildly dilated. - Right atrium: The atrium was mildly dilated. - Atrial septum: No defect or patent foramen ovale was identified. - Tricuspid valve: There was moderate regurgitation. - Pulmonary arteries: PA peak pressure: 34 mm Hg (S).  Antibiotics:  None   HPI/Subjective: Afebrile, denies CP, breathing stable and tolerating been more flat on bed w/o orthopnea   Objective: Filed Vitals:   01/04/15 1953 01/05/15 0537 01/05/15 1018 01/05/15 1342  BP: 107/58 116/75 113/74 90/54  Pulse: 64 64 63 67  Temp: 97.9 F (36.6 C) 97.8 F (36.6 C)  98.6 F (37 C)  TempSrc: Oral Axillary  Oral  Resp:  Height:      Weight:  76.295 kg (168 lb 3.2 oz)    SpO2: 100% 98%  93%     Exam:  General: in no acute distress. Denies CP and endorses that her breathing is stable. LE edema significantly improved HEENT: No bruits, no goiter. No JVD appreciated due to body habitus Heart: no rubs, no gallops, rate controlled Lungs: no frank crackles on  exam; improved air movement, no wheezing  Abdomen: Soft, nontender, nondistended, positive bowel sounds.  Neuro: Grossly intact, nonfocal. Extremities: 1++ edema bilaterally; TED hoses in place   Data Reviewed: Basic Metabolic Panel:  Recent Labs Lab 12/29/14 2249 12/31/14 0318 01/01/15 0346 01/02/15 0305 01/03/15 0353  NA 139 140 140 139 139  K 3.6 3.4* 3.3* 3.2* 3.4*  CL 100* 95* 93* 92* 93*  CO2 28 37* 38* 37* 38*  GLUCOSE 128* 130* 121* 130* 130*  BUN 25* 20 21* 20 21*  CREATININE 0.96 1.14* 1.09* 0.96 1.00  CALCIUM 8.8* 8.7* 8.7* 8.7* 8.7*  MG  --   --   --   --  2.2   CBC:  Recent Labs Lab 12/29/14 2249  WBC 6.8  NEUTROABS 4.3  HGB 13.4  HCT 42.2  MCV 86.1  PLT 218   Cardiac Enzymes:  Recent Labs Lab 12/29/14 2249 12/30/14 0418 12/30/14 1925 12/31/14 0318  TROPONINI 0.06* 0.07* 0.07* 0.08*   BNP (last 3 results)  Recent Labs  12/29/14 2249 01/03/15 0353  BNP 716.0* 1027.9*   CBG:  Recent Labs Lab 01/04/15 1612 01/04/15 2052 01/05/15 0640 01/05/15 1120 01/05/15 1621  GLUCAP 145* 183* 125* 133* 180*    Studies: No results found.  Scheduled Meds: . [START ON 01/06/2015] aspirin  81 mg Oral Pre-Cath  . calcium-vitamin D  1 tablet Oral BID WC  . carvedilol  3.125 mg Oral BID WC  . furosemide  40 mg Oral BID  . insulin aspart  0-15 Units Subcutaneous TID WC  . lisinopril  5 mg Oral Daily  . methimazole  5 mg Oral Once per day on Mon Wed Fri  . pantoprazole  40 mg Oral Q0600  . sertraline  50 mg Oral Daily  . simvastatin  40 mg Oral q1800  . sodium chloride  3 mL Intravenous Q12H  . sodium chloride  3 mL Intravenous Q12H  . sodium chloride  3 mL Intravenous Q12H  . vitamin C  500 mg Oral Daily   Continuous Infusions: . sodium chloride 10 mL/hr at 01/04/15 0544  . [START ON 01/06/2015] sodium chloride      Time: 35 minutes  Vassie Loll  [facility] Pager [phone]. If 7PM-7AM, please contact night-coverage at  [email or web address], password [facility] 01/05/2015, 5:04 PM  LOS: 6 days

## 2015-01-06 ENCOUNTER — Encounter (HOSPITAL_COMMUNITY): Payer: Self-pay | Admitting: Cardiovascular Disease

## 2015-01-06 ENCOUNTER — Encounter (HOSPITAL_COMMUNITY)
Admission: EM | Disposition: A | Discharge status: Skilled Nursing Facility | Payer: Self-pay | Source: Home / Self Care | Attending: Internal Medicine

## 2015-01-06 DIAGNOSIS — I429 Cardiomyopathy, unspecified: Secondary | ICD-10-CM

## 2015-01-06 DIAGNOSIS — I428 Other cardiomyopathies: Secondary | ICD-10-CM

## 2015-01-06 DIAGNOSIS — I2510 Atherosclerotic heart disease of native coronary artery without angina pectoris: Secondary | ICD-10-CM

## 2015-01-06 HISTORY — PX: CARDIAC CATHETERIZATION: SHX172

## 2015-01-06 LAB — CBC
HCT: 44.1 % (ref 36.0–46.0)
Hemoglobin: 13.8 g/dL (ref 12.0–15.0)
MCH: 27.4 pg (ref 26.0–34.0)
MCHC: 31.3 g/dL (ref 30.0–36.0)
MCV: 87.5 fL (ref 78.0–100.0)
PLATELETS: 208 10*3/uL (ref 150–400)
RBC: 5.04 MIL/uL (ref 3.87–5.11)
RDW: 15.8 % — AB (ref 11.5–15.5)
WBC: 7.5 10*3/uL (ref 4.0–10.5)

## 2015-01-06 LAB — BASIC METABOLIC PANEL
ANION GAP: 10 (ref 5–15)
BUN: 24 mg/dL — AB (ref 6–20)
CO2: 35 mmol/L — AB (ref 22–32)
Calcium: 8.4 mg/dL — ABNORMAL LOW (ref 8.9–10.3)
Chloride: 95 mmol/L — ABNORMAL LOW (ref 101–111)
Creatinine, Ser: 0.79 mg/dL (ref 0.44–1.00)
GFR calc Af Amer: >60 mL/min (ref >60)
GFR calc non Af Amer: >60 mL/min (ref >60)
GLUCOSE: 132 mg/dL — AB (ref 65–99)
Potassium: 3.8 mmol/L (ref 3.5–5.1)
Sodium: 140 mmol/L (ref 135–145)

## 2015-01-06 LAB — GLUCOSE, CAPILLARY
GLUCOSE-CAPILLARY: 117 mg/dL — AB (ref 65–99)
GLUCOSE-CAPILLARY: 146 mg/dL — AB (ref 65–99)
Glucose-Capillary: 128 mg/dL — ABNORMAL HIGH (ref 65–99)
Glucose-Capillary: 116 mg/dL — ABNORMAL HIGH (ref 65–99)
Glucose-Capillary: 170 mg/dL — ABNORMAL HIGH (ref 65–99)

## 2015-01-06 LAB — PROTIME-INR
INR: 1.47 (ref 0.00–1.49)
Prothrombin Time: 17.9 seconds — ABNORMAL HIGH (ref 11.6–15.2)

## 2015-01-06 SURGERY — LEFT HEART CATH AND CORONARY ANGIOGRAPHY
Anesthesia: LOCAL | Site: Groin

## 2015-01-06 MED ORDER — ACETAMINOPHEN 325 MG PO TABS: 650.0000 mg | ORAL_TABLET | ORAL | Status: DC | PRN

## 2015-01-06 MED ORDER — SODIUM CHLORIDE 0.9 % IV SOLN: 250.0000 mL | INTRAVENOUS | Status: DC | PRN

## 2015-01-06 MED ORDER — HEPARIN (PORCINE) IN NACL 2-0.9 UNIT/ML-% IJ SOLN
INTRAMUSCULAR | Status: AC
  Filled 2015-01-06: qty 1500

## 2015-01-06 MED ORDER — DIPHENHYDRAMINE HCL 50 MG/ML IJ SOLN
INTRAMUSCULAR | Status: AC
  Filled 2015-01-06: qty 1

## 2015-01-06 MED ORDER — DIPHENHYDRAMINE HCL 50 MG/ML IJ SOLN
INTRAMUSCULAR | Status: DC | PRN
  Administered 2015-01-06: 25 mg via INTRAVENOUS

## 2015-01-06 MED ORDER — LACTULOSE 10 GM/15ML PO SOLN
20.0000 g | Freq: Every day | ORAL | Status: DC
  Administered 2015-01-06 – 2015-01-07 (×2): 20 g via ORAL
  Filled 2015-01-06 (×2): qty 30

## 2015-01-06 MED ORDER — ONDANSETRON HCL 4 MG/2ML IJ SOLN: 4.0000 mg | Freq: Four times a day (QID) | INTRAMUSCULAR | Status: DC | PRN

## 2015-01-06 MED ORDER — SODIUM CHLORIDE 0.9 % IJ SOLN: 3.0000 mL | INTRAMUSCULAR | Status: DC | PRN

## 2015-01-06 MED ORDER — DOCUSATE SODIUM 100 MG PO CAPS
100.0000 mg | ORAL_CAPSULE | Freq: Two times a day (BID) | ORAL | Status: DC
  Administered 2015-01-06 – 2015-01-07 (×3): 100 mg via ORAL
  Filled 2015-01-06 (×4): qty 1

## 2015-01-06 MED ORDER — IOHEXOL 350 MG/ML SOLN
INTRAVENOUS | Status: DC | PRN
  Administered 2015-01-06: 60 mL via INTRA_ARTERIAL

## 2015-01-06 MED ORDER — LIDOCAINE HCL (PF) 1 % IJ SOLN
INTRAMUSCULAR | Status: DC | PRN
  Administered 2015-01-06: 11:00:00

## 2015-01-06 MED ORDER — SODIUM CHLORIDE 0.9 % IJ SOLN
3.0000 mL | Freq: Two times a day (BID) | INTRAMUSCULAR | Status: DC
  Administered 2015-01-06 – 2015-01-07 (×3): 3 mL via INTRAVENOUS

## 2015-01-06 MED ORDER — APIXABAN 5 MG PO TABS
5.0000 mg | ORAL_TABLET | Freq: Two times a day (BID) | ORAL | Status: DC
  Administered 2015-01-07: 5 mg via ORAL
  Filled 2015-01-06 (×2): qty 1

## 2015-01-06 MED ORDER — LIDOCAINE HCL (PF) 1 % IJ SOLN
INTRAMUSCULAR | Status: AC
  Filled 2015-01-06: qty 30

## 2015-01-06 MED ORDER — SODIUM CHLORIDE 0.9 % IV SOLN
INTRAVENOUS | Status: DC
  Administered 2015-01-06: 75 mL/h via INTRAVENOUS

## 2015-01-06 MED ORDER — CARVEDILOL 3.125 MG PO TABS
3.1250 mg | ORAL_TABLET | Freq: Once | ORAL | Status: AC
  Administered 2015-01-06: 3.125 mg via ORAL
  Filled 2015-01-06: qty 1

## 2015-01-06 SURGICAL SUPPLY — 8 items
CATH INFINITI 5FR JL5 (CATHETERS) ×3 IMPLANT
CATH INFINITI 5FR MULTPACK ANG (CATHETERS) ×3 IMPLANT
KIT HEART LEFT (KITS) ×3 IMPLANT
PACK CARDIAC CATHETERIZATION (CUSTOM PROCEDURE TRAY) ×3 IMPLANT
SHEATH PINNACLE 5F 10CM (SHEATH) ×3 IMPLANT
SYR MEDRAD MARK V 150ML (SYRINGE) ×3 IMPLANT
TRANSDUCER W/STOPCOCK (MISCELLANEOUS) ×3 IMPLANT
WIRE EMERALD 3MM-J .035X150CM (WIRE) ×3 IMPLANT

## 2015-01-06 NOTE — Progress Notes (Signed)
Patient in SVT with pulse of 140.  Skin warm and dry.  B/P = 97/42.  Dr. Vanessa Barbara in department and called to bedside.  Patient stated to Dr. Abigail Butts that she was having chest pain.  AM medications given around 1330, post-cath, including heart medications.  STAT 12 lead in progress.

## 2015-01-06 NOTE — H&P (View-Only) (Signed)
Patient Profile: 81 y/o female, followed by Dr. Tenny Craw, with h/o PAF on Eliquis, prior stroke and symptomatic bradycardia s/p PMM admitted with new onset systolic CHF,with dyspnea and volume overload. She had a 2D echo 07/2014 which demonstrated low normal LVF with EF of 50%. However, repeat echo 7/14 showed new reduction in EF to severe range of 25%.  Subjective: No complaints. Breathing ok on [place]. No chest pain. Family by beside.   Objective: Vital signs in last 24 hours: Temp:  [97.8 F (36.6 C)-98.1 F (36.7 C)] 97.8 F (36.6 C) (07/19 0537) Pulse Rate:  [62-69] 63 (07/19 1018) Resp:  [18] 18 (07/19 0537) BP: (93-116)/(49-75) 113/74 mmHg (07/19 1018) SpO2:  [98 %-100 %] 98 % (07/19 0537) Weight:  [168 lb 3.2 oz (76.295 kg)] 168 lb 3.2 oz (76.295 kg) (07/19 0537) Last BM Date: 01/04/15  Intake/Output from previous day: 07/18 0701 - 07/19 0700 In: 927.7 [P.O.:675; I.V.:252.7] Out: -  Intake/Output this shift: Total I/O In: 395 [P.O.:395] Out: -   Medications Current Facility-Administered Medications  Medication Dose Route Frequency Provider Last Rate Last Dose  . 0.9 %  sodium chloride infusion  250 mL Intravenous PRN Yevonne Pax, MD      . 0.9 %  sodium chloride infusion  250 mL Intravenous PRN Chrystie Nose, MD      . 0.9 %  sodium chloride infusion   Intravenous Continuous Chrystie Nose, MD 10 mL/hr at 01/04/15 0544    . acetaminophen (TYLENOL) tablet 650 mg  650 mg Oral Q4H PRN Yevonne Pax, MD   650 mg at 01/03/15 5956  . calcium-vitamin D (OSCAL WITH D) 500-200 MG-UNIT per tablet 1 tablet  1 tablet Oral BID WC Vassie Loll, MD   1 tablet at 01/05/15 1018  . carvedilol (COREG) tablet 3.125 mg  3.125 mg Oral BID WC Vassie Loll, MD   3.125 mg at 01/05/15 1019  . furosemide (LASIX) tablet 40 mg  40 mg Oral BID Chrystie Nose, MD   40 mg at 01/05/15 1017  . insulin aspart (novoLOG) injection 0-15 Units  0-15 Units Subcutaneous TID WC Yevonne Pax, MD   2 Units  at 01/05/15 [phone]  . lisinopril (PRINIVIL,ZESTRIL) tablet 5 mg  5 mg Oral Daily Yevonne Pax, MD   5 mg at 01/05/15 1019  . methimazole (TAPAZOLE) tablet 5 mg  5 mg Oral Once per day on Mon Wed Fri Yevonne Pax, MD   5 mg at 01/04/15 6433  . nitroGLYCERIN (NITROSTAT) SL tablet 0.4 mg  0.4 mg Sublingual Q5 min PRN Yevonne Pax, MD   0.4 mg at 01/04/15 1202  . ondansetron (ZOFRAN) injection 4 mg  4 mg Intravenous Q6H PRN Yevonne Pax, MD      . pantoprazole (PROTONIX) EC tablet 40 mg  40 mg Oral Q0600 Yevonne Pax, MD   40 mg at 01/05/15 1018  . polyethylene glycol (MIRALAX / GLYCOLAX) packet 17 g  17 g Oral Daily PRN Yevonne Pax, MD      . sertraline (ZOLOFT) tablet 50 mg  50 mg Oral Daily Yevonne Pax, MD   50 mg at 01/05/15 1017  . simvastatin (ZOCOR) tablet 40 mg  40 mg Oral q1800 Yevonne Pax, MD   40 mg at 01/04/15 1626  . sodium chloride 0.9 % injection 3 mL  3 mL Intravenous Q12H Yevonne Pax, MD   3 mL at 01/05/15 1020  .  sodium chloride 0.9 % injection 3 mL  3 mL Intravenous PRN Yevonne Pax, MD      . sodium chloride 0.9 % injection 3 mL  3 mL Intravenous Q12H Chrystie Nose, MD   3 mL at 01/05/15 1019  . sodium chloride 0.9 % injection 3 mL  3 mL Intravenous PRN Chrystie Nose, MD      . vitamin C (ASCORBIC ACID) tablet 500 mg  500 mg Oral Daily Yevonne Pax, MD   500 mg at 01/05/15 1017  . zolpidem (AMBIEN) tablet 5 mg  5 mg Oral QHS PRN Yevonne Pax, MD   5 mg at 01/04/15 2351    PE: General appearance: alert, cooperative and no distress Neck: no carotid bruit and no JVD Lungs: bibasilar rales Heart: regular rate and rhythm, S1, S2 normal, no murmur, click, rub or gallop Extremities: no LEE, with compression stockings Pulses: 2+ and symmetric Skin: warm and dry Neurologic: Grossly normal  Lab Results:  No results for input(s): WBC, HGB, HCT, PLT in the last 72 hours. BMET  Recent Labs  01/03/15 0353  NA 139  K 3.4*  CL 93*  CO2 38*  GLUCOSE 130*  BUN  21*  CREATININE 1.00  CALCIUM 8.7*   PT/INR No results for input(s): LABPROT, INR in the last 72 hours. Cardiac Panel (last 3 results) No results for input(s): CKTOTAL, CKMB, TROPONINI, RELINDX in the last 72 hours.  Studies/Results: 2D echo 12/31/14 Study Conclusions  - Left ventricle: Diffuse hypokinesis with mid and basal inferior akinesis The cavity size was severely dilated. Wall thickness was increased in a pattern of mild LVH. There was mild focal basal and mild concentric hypertrophy of the septum. The estimated ejection fraction was 25%. - Mitral valve: There was moderate regurgitation. - Left atrium: The atrium was severely dilated. - Right ventricle: The cavity size was mildly dilated. - Right atrium: The atrium was mildly dilated. - Atrial septum: No defect or patent foramen ovale was identified. - Tricuspid valve: There was moderate regurgitation. - Pulmonary arteries: PA peak pressure: 34 mm Hg (S).   Assessment/Plan  Active Problems:   Essential hypertension   Atrial fibrillation   HLD (hyperlipidemia)   Acute systolic heart failure   CHF (congestive heart failure)   Acute on chronic diastolic heart failure   SOB (shortness of breath)   Acute on chronic combined systolic and diastolic CHF (congestive heart failure)   Elevated troponin   Peripheral edema   1. Acute on chronic systolic CHF with new onset DCM of unknown etiology: EF now 25%. ? Progression of CAD. Plan for [facility] tomorrow to assess. Volume status significanlty improved however I/Os not accurate as output not being recorded.    - Continue po lasix 40 mg BID  -  Continue BB and ACE-I for LV dysfunction.   2. Mildly elevated troponin with flat trend: 0.07, 0.07, 0.08. Most likely related to CHF with demand ischemia.However with new LV dysfunction, plan for cath tomorrow to r/o CAD.     3. PAF:  maintaining a paced rhythm on BB.  Hold Eliquis for LHC.  4. Hyperthyroidism:  on  methimazole - TSH ok   Rooney Swails, PA-C  01/05/2015   LOS: 6 days  01/05/2015 11:44 AM

## 2015-01-06 NOTE — Progress Notes (Signed)
CSW received call from Nathalie- Admissions at [facility]- update provided. Possible d/c tomorrow to SNF if stable.  She states that she has held the SNF bed for patient almost a week and will have to release the bed tomorrow afternoon if patient is unable to be d/c'd.  She would offer next available bed if needed. CSW will monitor and provide update in the morning after progression meeting.  Lorri Frederick. Jaci Lazier, [place] [phone]

## 2015-01-06 NOTE — Progress Notes (Signed)
DAILY PROGRESS NOTE  Subjective:  Cath results reviewed, non-obstructive mild coronary disease.   Objective:  Temp:  [97.6 F (36.4 C)-98.6 F (37 C)] 98.5 F (36.9 C) (07/20 1250) Pulse Rate:  [0-224] 72 (07/20 1250) Resp:  [0-88] 20 (07/20 1250) BP: (90-154)/(54-98) 131/81 mmHg (07/20 1250) SpO2:  [0 %-100 %] 97 % (07/20 1250) Weight:  [168 lb 8 oz (76.431 kg)] 168 lb 8 oz (76.431 kg) (07/20 0527) Weight change: 4.8 oz (0.136 kg)  Intake/Output from previous day: 07/19 0701 - 07/20 0700 In: 515 [P.O.:515] Out: -   Intake/Output from this shift:    Medications: Current Facility-Administered Medications  Medication Dose Route Frequency Provider Last Rate Last Dose  . 0.9 %  sodium chloride infusion  250 mL Intravenous PRN Allyne Gee, MD      . 0.9 %  sodium chloride infusion  250 mL Intravenous PRN Troy Sine, MD      . 0.9 %  sodium chloride infusion   Intravenous Continuous Troy Sine, MD 100 mL/hr at 01/06/15 1229    . acetaminophen (TYLENOL) tablet 650 mg  650 mg Oral Q4H PRN Allyne Gee, MD   650 mg at 01/03/15 2248  . [START ON 01/07/2015] apixaban (ELIQUIS) tablet 5 mg  5 mg Oral BID Troy Sine, MD      . calcium-vitamin D (OSCAL WITH D) 500-200 MG-UNIT per tablet 1 tablet  1 tablet Oral BID WC Barton Dubois, MD   Stopped at 01/06/15 1034  . carvedilol (COREG) tablet 3.125 mg  3.125 mg Oral BID WC Barton Dubois, MD   Stopped at 01/06/15 1034  . docusate sodium (COLACE) capsule 100 mg  100 mg Oral BID Kelvin Cellar, MD   Stopped at 01/06/15 1035  . furosemide (LASIX) tablet 40 mg  40 mg Oral BID Pixie Casino, MD   Stopped at 01/06/15 1035  . insulin aspart (novoLOG) injection 0-15 Units  0-15 Units Subcutaneous TID WC Allyne Gee, MD   Stopped at 01/06/15 1036  . lactulose (CHRONULAC) 10 GM/15ML solution 20 g  20 g Oral Daily Kelvin Cellar, MD   Stopped at 01/06/15 1036  . lisinopril (PRINIVIL,ZESTRIL) tablet 5 mg  5 mg Oral Daily Allyne Gee, MD   Stopped at 01/06/15 1037  . methimazole (TAPAZOLE) tablet 5 mg  5 mg Oral Once per day on Mon Wed Fri Allyne Gee, MD   Stopped at 01/06/15 1037  . nitroGLYCERIN (NITROSTAT) SL tablet 0.4 mg  0.4 mg Sublingual Q5 min PRN Allyne Gee, MD   0.4 mg at 01/04/15 1202  . ondansetron (ZOFRAN) injection 4 mg  4 mg Intravenous Q6H PRN Allyne Gee, MD      . pantoprazole (PROTONIX) EC tablet 40 mg  40 mg Oral Q0600 Allyne Gee, MD   40 mg at 01/06/15 0450  . polyethylene glycol (MIRALAX / GLYCOLAX) packet 17 g  17 g Oral Daily PRN Allyne Gee, MD      . sertraline (ZOLOFT) tablet 50 mg  50 mg Oral Daily Allyne Gee, MD   Stopped at 01/06/15 1037  . simvastatin (ZOCOR) tablet 40 mg  40 mg Oral q1800 Allyne Gee, MD   40 mg at 01/05/15 1821  . sodium chloride 0.9 % injection 3 mL  3 mL Intravenous Q12H Allyne Gee, MD   3 mL at 01/05/15 1020  . sodium chloride 0.9 % injection 3 mL  3 mL Intravenous PRN Allyne Gee, MD      . sodium chloride 0.9 % injection 3 mL  3 mL Intravenous Q12H Troy Sine, MD      . sodium chloride 0.9 % injection 3 mL  3 mL Intravenous PRN Troy Sine, MD      . vitamin C (ASCORBIC ACID) tablet 500 mg  500 mg Oral Daily Allyne Gee, MD   Stopped at 01/06/15 1039  . zolpidem (AMBIEN) tablet 5 mg  5 mg Oral QHS PRN Allyne Gee, MD   5 mg at 01/04/15 2351    Physical Exam: General appearance: alert and no distress Lungs: clear to auscultation bilaterally Heart: regular rate and rhythm, S1, S2 normal, no murmur, click, rub or gallop Extremities: extremities normal, atraumatic, no cyanosis or edema Pulses: 2+ and symmetric  Lab Results: Results for orders placed or performed during the hospital encounter of 12/29/14 (from the past 48 hour(s))  Glucose, capillary     Status: Abnormal   Collection Time: 01/04/15  4:12 PM  Result Value Ref Range   Glucose-Capillary 145 (H) 65 - 99 mg/dL  Glucose, capillary     Status: Abnormal   Collection  Time: 01/04/15  8:52 PM  Result Value Ref Range   Glucose-Capillary 183 (H) 65 - 99 mg/dL  Glucose, capillary     Status: Abnormal   Collection Time: 01/05/15  6:40 AM  Result Value Ref Range   Glucose-Capillary 125 (H) 65 - 99 mg/dL  Glucose, capillary     Status: Abnormal   Collection Time: 01/05/15 11:20 AM  Result Value Ref Range   Glucose-Capillary 133 (H) 65 - 99 mg/dL   Comment 1 Notify RN   Glucose, capillary     Status: Abnormal   Collection Time: 01/05/15  4:21 PM  Result Value Ref Range   Glucose-Capillary 180 (H) 65 - 99 mg/dL  CBC     Status: None   Collection Time: 01/05/15  7:21 PM  Result Value Ref Range   WBC 7.1 4.0 - 10.5 K/uL   RBC 4.79 3.87 - 5.11 MIL/uL   Hemoglobin 13.1 12.0 - 15.0 g/dL   HCT 41.6 36.0 - 46.0 %   MCV 86.8 78.0 - 100.0 fL   MCH 27.3 26.0 - 34.0 pg   MCHC 31.5 30.0 - 36.0 g/dL   RDW 15.5 11.5 - 15.5 %   Platelets 219 150 - 400 K/uL  Protime-INR     Status: Abnormal   Collection Time: 01/05/15  7:21 PM  Result Value Ref Range   Prothrombin Time 17.9 (H) 11.6 - 15.2 seconds   INR 1.47 0.00 - 1.49  Glucose, capillary     Status: Abnormal   Collection Time: 01/05/15  8:30 PM  Result Value Ref Range   Glucose-Capillary 158 (H) 65 - 99 mg/dL   Comment 1 Notify RN    Comment 2 Document in Chart   Basic metabolic panel     Status: Abnormal   Collection Time: 01/06/15  3:17 AM  Result Value Ref Range   Sodium 140 135 - 145 mmol/L   Potassium 3.8 3.5 - 5.1 mmol/L   Chloride 95 (L) 101 - 111 mmol/L   CO2 35 (H) 22 - 32 mmol/L   Glucose, Bld 132 (H) 65 - 99 mg/dL   BUN 24 (H) 6 - 20 mg/dL   Creatinine, Ser 0.79 0.44 - 1.00 mg/dL   Calcium 8.4 (L) 8.9 - 10.3 mg/dL  GFR calc non Af Amer >60 >60 mL/min   GFR calc Af Amer >60 >60 mL/min    Comment: (NOTE) The eGFR has been calculated using the CKD EPI equation. This calculation has not been validated in all clinical situations. eGFR's persistently <60 mL/min signify possible Chronic  Kidney Disease.    Anion gap 10 5 - 15  Protime-INR     Status: Abnormal   Collection Time: 01/06/15  3:17 AM  Result Value Ref Range   Prothrombin Time 17.9 (H) 11.6 - 15.2 seconds   INR 1.47 0.00 - 1.49  CBC     Status: Abnormal   Collection Time: 01/06/15  3:17 AM  Result Value Ref Range   WBC 7.5 4.0 - 10.5 K/uL   RBC 5.04 3.87 - 5.11 MIL/uL   Hemoglobin 13.8 12.0 - 15.0 g/dL   HCT 44.1 36.0 - 46.0 %   MCV 87.5 78.0 - 100.0 fL   MCH 27.4 26.0 - 34.0 pg   MCHC 31.3 30.0 - 36.0 g/dL   RDW 15.8 (H) 11.5 - 15.5 %   Platelets 208 150 - 400 K/uL  Glucose, capillary     Status: Abnormal   Collection Time: 01/06/15  6:12 AM  Result Value Ref Range   Glucose-Capillary 117 (H) 65 - 99 mg/dL   Comment 1 Notify RN    Comment 2 Document in Chart   Glucose, capillary     Status: Abnormal   Collection Time: 01/06/15  9:40 AM  Result Value Ref Range   Glucose-Capillary 128 (H) 65 - 99 mg/dL   Comment 1 Notify RN     Imaging: No results found.  Assessment:  1. Active Problems: 2.   Essential hypertension 3.   Atrial fibrillation 4.   HLD (hyperlipidemia) 5.   Acute systolic heart failure 6.   CHF (congestive heart failure) 7.   Acute on chronic diastolic heart failure 8.   SOB (shortness of breath) 9.   Acute on chronic combined systolic and diastolic CHF (congestive heart failure) 10.   Elevated troponin 11.   Peripheral edema 12.   NICM (nonischemic cardiomyopathy) 13.   Coronary artery disease due to lipid rich plaque 14.   Plan:  1. Resume Eliquis tomorrow at 5 mg BID. Also on low dose aspirin 81 mg daily. Coreg and lisinopril for CHF. Simvastatin 40 mg daily. Lasix 40 mg BID. Discussed with son, plan for possible transfer to [facility] tomorrow for SNF rehab.  Time Spent Directly with Patient:  15 minutes  Length of Stay:  LOS: 7 days   Pixie Casino, MD, [facility] Attending Cardiologist [place] 01/06/2015, 12:52 PM

## 2015-01-06 NOTE — Progress Notes (Signed)
Site area: rt groin Site Prior to Removal:  Level 0 Pressure Applied For:  25 minutes Manual:   Yes  Patient Status During Pull:  stable Post Pull Site:  Level  0 Post Pull Instructions Given:  Yes . Son at bedside translating instructions to patient. Post Pull Pulses Present: yes Dressing Applied:  yes Bedrest begins @  1240 Comments:  0

## 2015-01-06 NOTE — Progress Notes (Signed)
B/P = 108/66 and Heart rate 74.  12 Lead EKG obtained and patient back in Atrial paced rhythm @ 62.  Corine [facility]ter, [place] to bedside and episode of SVT with low b/p and 12 Lead EKG results, plus medication review relayed to him.  He stated he will review and adjust meds as needed.

## 2015-01-06 NOTE — Progress Notes (Signed)
[facility] PROGRESS NOTE Interim History: 81 y.o. female with history of CAD A Fib Stroke HTN hyperlipidemia presents with increased leg swelling. Patient has a history of CHF. Over the last week she has been having increased swelling of her feet. Patient was seen by the PCP and was told to elevate the legs to help reduce the swelling. Patient now started to have increased SOB also and more fatigued. She has required more help getting around whereas the son states that she normally uses the walker to get around. Patient has been in the hospital previously and has required admission to rehab for severe deconditioning. The son states that she has been progressively declining. In the ED she was noted to have low saturations. Cath on 7/20. Adequate diuresis and with good heart failure drugs on board.   Filed Weights   01/04/15 0601 01/05/15 0537 01/06/15 0527  Weight: 77.891 kg (171 lb 11.5 oz) 76.295 kg (168 lb 3.2 oz) 76.431 kg (168 lb 8 oz)       No intake or output data in the 24 hours ending 01/06/15 1348   Assessment/Plan: 1-Acute on chronic combined systolic and diastolic heart failure -patient still with fluid overload on exam; even improved from admission  -mild elevation of troponin and Echo demonstrating significant reduction on her EF (now down to 25%); possible ischemia vs demand ischemia. -Cardiology consulted and she underwent cardiac catheterization on 01/06/2015 which showed mild nonobstructive coronary artery disease -CHF likely secondary to nonischemic cardiomyopathy -Cardiology recommending aggressive medical management  2-HLD (hyperlipidemia): continue zocor  3-Atrial fibrillation: rate control -Plan to resume eliquis in am  -patient also on coreg low dose now (well tolerated)  4-Essential hypertension: soft, but stable -will monitor and hold medications as needed  5-depression: continue sertraline  6-GERD: continue PPI  7-hyperthyroidism: continue  current dose of methimazole -TSH WNL   Code Status: Full Family Communication: no family at bedside Disposition Plan: Plan for skilled nursing facility placement, possibly tomorrow   Consultants:  Cardiology   Procedures: ECHO:  - Left ventricle: Diffuse hypokinesis with mid and basal inferior akinesis The cavity size was severely dilated. Wall thickness was increased in a pattern of mild LVH. There was mild focal basal and mild concentric hypertrophy of the septum. The estimated ejection fraction was 25%. - Mitral valve: There was moderate regurgitation. - Left atrium: The atrium was severely dilated. - Right ventricle: The cavity size was mildly dilated. - Right atrium: The atrium was mildly dilated. - Atrial septum: No defect or patent foramen ovale was identified. - Tricuspid valve: There was moderate regurgitation. - Pulmonary arteries: PA peak pressure: 34 mm Hg (S).  Antibiotics:  None   HPI/Subjective: Patient is awake and alert, can't really denies chest pain or shortness of breath  Objective: Filed Vitals:   01/06/15 1220 01/06/15 1225 01/06/15 1230 01/06/15 1250  BP: 139/79 129/88 154/86 131/81  Pulse: 73 73 73 72  Temp:    98.5 F (36.9 C)  TempSrc:    Axillary  Resp: Height:      Weight:      SpO2: 100% 100% 100% 97%     Exam:  General: She is awake and alert, oriented, denies chest pain HEENT: No bruits, no goiter. No JVD appreciated due to body habitus Heart: no rubs, no gallops, rate controlled Lungs: no frank crackles on exam; improved air movement, no wheezing  Abdomen: Soft, nontender, nondistended, positive bowel sounds.  Neuro: Grossly intact, nonfocal.  Extremities: Improved bilateral extremity pitting edema, TED hose in place   Data Reviewed: Basic Metabolic Panel:  Recent Labs Lab 12/31/14 0318 01/01/15 0346 01/02/15 0305 01/03/15 0353 01/06/15 0317  NA 140 140 139 139 140  K 3.4* 3.3* 3.2* 3.4* 3.8    CL 95* 93* 92* 93* 95*  CO2 37* 38* 37* 38* 35*  GLUCOSE 130* 121* 130* 130* 132*  BUN 20 21* 20 21* 24*  CREATININE 1.14* 1.09* 0.96 1.00 0.79  CALCIUM 8.7* 8.7* 8.7* 8.7* 8.4*  MG  --   --   --  2.2  --    CBC:  Recent Labs Lab 01/05/15 1921 01/06/15 0317  WBC 7.1 7.5  HGB 13.1 13.8  HCT 41.6 44.1  MCV 86.8 87.5  PLT 219 208   Cardiac Enzymes:  Recent Labs Lab 12/30/14 1925 12/31/14 0318  TROPONINI 0.07* 0.08*   BNP (last 3 results)  Recent Labs  12/29/14 2249 01/03/15 0353  BNP 716.0* 1027.9*   CBG:  Recent Labs Lab 01/05/15 1120 01/05/15 1621 01/05/15 2030 01/06/15 0612 01/06/15 0940  GLUCAP 133* 180* 158* 117* 128*    Studies: No results found.  Scheduled Meds: . [START ON 01/07/2015] apixaban  5 mg Oral BID  . calcium-vitamin D  1 tablet Oral BID WC  . carvedilol  3.125 mg Oral BID WC  . docusate sodium  100 mg Oral BID  . furosemide  40 mg Oral BID  . insulin aspart  0-15 Units Subcutaneous TID WC  . lactulose  20 g Oral Daily  . lisinopril  5 mg Oral Daily  . methimazole  5 mg Oral Once per day on Mon Wed Fri  . pantoprazole  40 mg Oral Q0600  . sertraline  50 mg Oral Daily  . simvastatin  40 mg Oral q1800  . sodium chloride  3 mL Intravenous Q12H  . sodium chloride  3 mL Intravenous Q12H  . vitamin C  500 mg Oral Daily   Continuous Infusions: . sodium chloride 100 mL/hr at 01/06/15 1229    Time: 25 minutes  Jeralyn Bennett  [facility] Pager [phone]. If 7PM-7AM, please contact night-coverage at [email or web address], password [facility] 01/06/2015, 1:48 PM  LOS: 7 days

## 2015-01-06 NOTE — Progress Notes (Signed)
Pt returned from cath lab, placed on tele. Family at the bedside. Instructed pt. To call for assistance. Call bell within reach. Rt goin assessed no bleeding or hematoma. Level 0

## 2015-01-06 NOTE — Progress Notes (Signed)
Called to see, pt went into a rapid PSVT with chest pain and hypotension. She converted spontaneously to NSR with resolution of her symptoms. She received only one Coreg dose today at 1pm- will give a second dose at 6 pm and watch overnight.  Corine Shelter PA-C 01/06/2015 3:49 PM

## 2015-01-06 NOTE — Interval H&P Note (Signed)
Cath Lab Visit (complete for each Cath Lab visit)  Clinical Evaluation Leading to the Procedure:   ACS: No.  Non-ACS:    Anginal Classification: CCS IV  Anti-ischemic medical therapy: Maximal Therapy (2 or more classes of medications)  Non-Invasive Test Results: No non-invasive testing performed  Prior CABG: No previous CABG      History and Physical Interval Note:  01/06/2015 10:33 AM  [name]  has presented today for surgery, with the diagnosis of Chest Pain  The various methods of treatment have been discussed with the patient and family. After consideration of risks, benefits and other options for treatment, the patient has consented to  Procedure(s): Left Heart Cath and Coronary Angiography (N/Holt) as Holt surgical intervention .  The patient's history has been reviewed, patient examined, no change in status, stable for surgery.  I have reviewed the patient's chart and labs.  Questions were answered to the patient's satisfaction.     [name]

## 2015-01-07 DIAGNOSIS — IMO0001 Reserved for inherently not codable concepts without codable children: Secondary | ICD-10-CM

## 2015-01-07 DIAGNOSIS — Z7901 Long term (current) use of anticoagulants: Secondary | ICD-10-CM

## 2015-01-07 DIAGNOSIS — Z950 Presence of cardiac pacemaker: Secondary | ICD-10-CM

## 2015-01-07 DIAGNOSIS — I4891 Unspecified atrial fibrillation: Secondary | ICD-10-CM

## 2015-01-07 DIAGNOSIS — Z0389 Encounter for observation for other suspected diseases and conditions ruled out: Secondary | ICD-10-CM

## 2015-01-07 LAB — BASIC METABOLIC PANEL
ANION GAP: 6 (ref 5–15)
BUN: 18 mg/dL (ref 6–20)
CO2: 37 mmol/L — AB (ref 22–32)
Calcium: 8.3 mg/dL — ABNORMAL LOW (ref 8.9–10.3)
Chloride: 99 mmol/L — ABNORMAL LOW (ref 101–111)
Creatinine, Ser: 0.68 mg/dL (ref 0.44–1.00)
GFR calc Af Amer: >60 mL/min (ref >60)
GFR calc non Af Amer: >60 mL/min (ref >60)
Glucose, Bld: 121 mg/dL — ABNORMAL HIGH (ref 65–99)
Potassium: 3.6 mmol/L (ref 3.5–5.1)
SODIUM: 142 mmol/L (ref 135–145)

## 2015-01-07 LAB — CBC
HEMATOCRIT: 41.3 % (ref 36.0–46.0)
Hemoglobin: 12.8 g/dL (ref 12.0–15.0)
MCH: 26.8 pg (ref 26.0–34.0)
MCHC: 31 g/dL (ref 30.0–36.0)
MCV: 86.4 fL (ref 78.0–100.0)
Platelets: 217 10*3/uL (ref 150–400)
RBC: 4.78 MIL/uL (ref 3.87–5.11)
RDW: 15.8 % — AB (ref 11.5–15.5)
WBC: 6.9 10*3/uL (ref 4.0–10.5)

## 2015-01-07 LAB — GLUCOSE, CAPILLARY
Glucose-Capillary: 119 mg/dL — ABNORMAL HIGH (ref 65–99)
Glucose-Capillary: 132 mg/dL — ABNORMAL HIGH (ref 65–99)

## 2015-01-07 LAB — MRSA PCR SCREENING: MRSA by PCR: NEGATIVE

## 2015-01-07 MED ORDER — LISINOPRIL 5 MG PO TABS: 2.5000 mg | ORAL_TABLET | Freq: Every day | ORAL | Status: DC

## 2015-01-07 MED ORDER — FUROSEMIDE 40 MG PO TABS: 40.0000 mg | ORAL_TABLET | Freq: Two times a day (BID) | ORAL | Status: DC

## 2015-01-07 MED ORDER — POTASSIUM CHLORIDE ER 10 MEQ PO TBCR: 10.0000 meq | EXTENDED_RELEASE_TABLET | Freq: Every day | ORAL | Status: DC

## 2015-01-07 MED ORDER — CARVEDILOL 6.25 MG PO TABS: 6.2500 mg | ORAL_TABLET | Freq: Two times a day (BID) | ORAL | Status: DC

## 2015-01-07 MED ORDER — CARVEDILOL 3.125 MG PO TABS: 3.1250 mg | ORAL_TABLET | Freq: Two times a day (BID) | ORAL | Status: DC

## 2015-01-07 NOTE — Progress Notes (Signed)
CSW (Clinical Social Worker) prepared pt dc packet and placed with shadow chart. CSW arranged non-emergent ambulance transport. Pt family, pt nurse, and facility informed. CSW signing off.  Pattricia Weiher, LCSWA [phone]  

## 2015-01-07 NOTE — Discharge Summary (Addendum)
Physician Discharge Summary  [name] WKG:[id number] DOB: 31-Aug-1931 DOA: 12/29/2014  PCP: [name], NP  Admit date: 12/29/2014 Discharge date: 01/07/2015  Time spent: 35 minutes  Recommendations for Outpatient Follow-up:  1. Please follow-up on patient's volume status, he was diagnosed with nonischemic cardiomyopathy and discharged on Lasix 40 mg twice a day 2. Obtain a BMP in 4-5 days, she is being discharged on Lasix. On 01/07/2015 she had a creatinine of 0.68 with BUN of 18. Her potassium was 3.6.  3. Patient discharged to skilled nursing facility  Discharge Diagnoses:  Principal Problem:   SOB (shortness of breath) Active Problems:   Essential hypertension   P[employer]EM[employer]   Atrial fibrillation with RVR   History of CVA (cerebrovascular accident)   HLD (hyperlipidemia)   Acute on chronic combined systolic and diastolic CHF (congestive heart failure)   Elevated troponin   NICM (nonischemic cardiomyopathy)   Normal coronary arteries   Chronic anticoagulation-Eliquis   Discharge Condition: Stable  Diet recommendation: Heart healthy  Filed Weights   01/05/15 0537 01/06/15 0527 01/07/15 0502  Weight: 76.295 kg (168 lb 3.2 oz) 76.431 kg (168 lb 8 oz) 76.794 kg (169 lb 4.8 oz)    History of present illness:  [name] is a 81 y.o. female with history of CAD A Fib Stroke HTN hyperlipidemia presents with increased leg swelling. Patient has a history of CHF. Over the last week she has been having increased swelling of her feet. Patient was seen by the PCP and was told to elevate the legs to help reduce the swelling. Patient now started to have increased SOB also and more fatigued. She has required more help getting around whereas the son states that she normally uses the walker to get around. Patient has been in the hospital previously and has required admission to rehab for severe deconditioning. The son states that she has been progressively declining.  In the ED she was noted to have low saturations. She was also given lasix and she states that she feels "so-so". Her last EF was noted to be 50% in February of this year.  Hospital Course:  1-Acute on chronic combined systolic and diastolic heart failure -Transthoracic echocardiogram performed on 12/31/2014 showed an ejection traction of 25% with cavity size was severely dilated, wall thickness increased in a pattern of mild LVH. She will be discharged on Lasix 40 mg by mouth twice a day.  -Cardiology consulted and she underwent cardiac catheterization on 01/06/2015 which showed mild nonobstructive coronary artery disease -CHF likely secondary to nonischemic cardiomyopathy -She was discharged on Coreg 6.25 mg by mouth twice a day along with Lasix 40 mg by mouth twice a day and lisinopril 2.5 mg by mouth daily. Please follow-up on blood pressures.  2-HLD (hyperlipidemia):  -Discharged on statin therapy  3-Atrial fibrillation:  -On morning of discharge she was rate controlled having ventricular rates in the 60s to 70s -Her eliquis was restarted on 01/07/2015  4-Essential hypertension:  -Because of soft blood pressures I decreased her lisinopril from 5-2.5 mg by mouth daily. Will continue Coreg at 6.25 mg by mouth twice a day. His follow-up on blood pressures.  Procedures:  Cardiac catheterization performed on 01/06/2015; showing mild nonobstructive coronary artery disease  Transthoracic echocardiogram performed on 12/31/2014 showed an estimated ejection fraction of 25%  Consultations:  Cardiology  Discharge Exam: Filed Vitals:   01/07/15 0502  BP: 117/75  Pulse: 63  Temp: 97.9 F (36.6 C)  Resp: 18  General: Patient is in no acute distress she is awake and alert Cardiovascular: Irregular rate and rhythm normal S1-S2, no extremity edema Respiratory: Normal auditory effort, lungs are clear to auscultation Abdomen: Soft nontender nondistended  Discharge  Instructions   Discharge Instructions    (HEART FAILURE PATIENTS) Call MD:  Anytime you have any of the following symptoms: 1) 3 pound weight gain in 24 hours or 5 pounds in 1 week 2) shortness of breath, with or without a dry hacking cough 3) swelling in the hands, feet or stomach 4) if you have to sleep on extra pillows at night in order to breathe.    Complete by:  As directed      Call MD for:  difficulty breathing, headache or visual disturbances    Complete by:  As directed      Call MD for:  extreme fatigue    Complete by:  As directed      Call MD for:  hives    Complete by:  As directed      Call MD for:  persistant dizziness or light-headedness    Complete by:  As directed      Call MD for:  persistant nausea and vomiting    Complete by:  As directed      Call MD for:  redness, tenderness, or signs of infection (pain, swelling, redness, odor or green/yellow discharge around incision site)    Complete by:  As directed      Call MD for:  severe uncontrolled pain    Complete by:  As directed      Call MD for:  temperature >100.4    Complete by:  As directed      Call MD for:    Complete by:  As directed      Diet - low sodium heart healthy    Complete by:  As directed      Increase activity slowly    Complete by:  As directed           Current Discharge Medication List    START taking these medications   Details  carvedilol (COREG) 6.25 MG tablet Take 1 tablet (6.25 mg total) by mouth 2 (two) times daily with a meal. Qty: 60 tablet, Refills: 1    furosemide (LASIX) 40 MG tablet Take 1 tablet (40 mg total) by mouth 2 (two) times daily. Qty: 60 tablet, Refills: 1    potassium chloride (K-DUR) 10 MEQ tablet Take 1 tablet (10 mEq total) by mouth daily. Qty: 30 tablet, Refills: 1      CONTINUE these medications which have CHANGED   Details  lisinopril (PRINIVIL,ZESTRIL) 5 MG tablet Take 0.5 tablets (2.5 mg total) by mouth daily. Qty: 30 tablet, Refills: 11       CONTINUE these medications which have NOT CHANGED   Details  alendronate (FOSAMAX) 70 MG tablet Take 1 tablet (70 mg total) by mouth once a week. Take with a full glass of water on an empty stomach. Qty: 4 tablet, Refills: 5    apixaban (ELIQUIS) 5 MG TABS tablet Take 1 tablet (5 mg total) by mouth 2 (two) times daily. Qty: 60 tablet, Refills: 11    Ascorbic Acid (VITAMIN C) 500 MG tablet Take 500 mg by mouth daily.      Calcium Carbonate-Vit D-Min 600-400 MG-UNIT TABS Take 1 tablet by mouth 2 (two) times daily.     methimazole (TAPAZOLE) 5 MG tablet Take one tablet by mouth three days  a week as prescribed by endocrinology Qty: 12 tablet, Refills: 5   Associated Diagnoses: Thyrotoxicosis without thyroid storm, unspecified thyrotoxicosis type    Multiple Vitamins-Minerals (CENTRUM SILVER) tablet Take 1 tablet by mouth daily.      nitroGLYCERIN (NITROSTAT) 0.4 MG SL tablet Place 1 tablet (0.4 mg total) under the tongue every 5 (five) minutes as needed. For chest pain Qty: 30 tablet, Refills: 0    pantoprazole (PROTONIX) 40 MG tablet Take 1 tablet (40 mg total) by mouth daily at 6 (six) AM. Qty: 30 tablet, Refills: 11    polyethylene glycol (MIRALAX / GLYCOLAX) packet Take 17 g by mouth daily as needed for moderate constipation.    sertraline (ZOLOFT) 50 MG tablet Take 1 tablet (50 mg total) by mouth daily. Qty: 30 tablet, Refills: 5    simvastatin (ZOCOR) 40 MG tablet Take 1 tablet (40 mg total) by mouth daily at 6 PM. Qty: 30 tablet, Refills: 11      STOP taking these medications     hydrochlorothiazide (MICROZIDE) 12.5 MG capsule        Allergies  Allergen Reactions  . Namenda [Memantine Hcl] Other (See Comments)    Dizzy  . Iodine Other (See Comments)    unknown  . Iohexol Hives     Code: HIVES, Desc: hives with nonionic contrast 7 yrs ago ([place] iv benedryl given   . Peanut-Containing Drug Products Other (See Comments)    unknown   Follow-up Information     Follow up with Abbey Chatters K, NP On 01/11/2015.   Specialty:  Nurse Practitioner   Why:  Appt. @ 11;30am   Contact information:   [address] [place] [address] [phone]       Follow up with Chrystie Nose, MD In 2 weeks.   Specialty:  Cardiology   Contact information:   [address] [place] [address] [phone]        The results of significant diagnostics from this hospitalization (including imaging, microbiology, ancillary and laboratory) are listed below for reference.    Significant Diagnostic Studies: Dg Chest 2 View  12/30/2014   CLINICAL DATA:  81 year old female with increased fluid retention and swelling.  EXAM: CHEST  2 VIEW  COMPARISON:  Chest radiograph dated 08/25/2014  FINDINGS: Two views of the chest demonstrate clear lungs. No focal consolidation, pleural effusion, or pneumothorax. Moderate cardiomegaly. Left pectoral pacemaker device. Osteopenia with degenerative changes of the spine. Age indeterminate old appearing lower thoracic compression fractures.  IMPRESSION: No active cardiopulmonary disease.  Cardiomegaly.   Electronically Signed   By: Elgie Collard M.D.   On: 12/30/2014 00:02    Microbiology: Recent Results (from the past 240 hour(s))  MRSA PCR Screening     Status: None   Collection Time: 01/06/15 11:41 PM  Result Value Ref Range Status   MRSA by PCR NEGATIVE NEGATIVE Final    Comment:        The GeneXpert MRSA Assay (FDA approved for NASAL specimens only), is one component of a comprehensive MRSA colonization surveillance program. It is not intended to diagnose MRSA infection nor to guide or monitor treatment for MRSA infections.      Labs: Basic Metabolic Panel:  Recent Labs Lab 01/01/15 0346 01/02/15 0305 01/03/15 0353 01/06/15 0317 01/07/15 0528  NA 140 139 139 140 142  K 3.3* 3.2* 3.4* 3.8 3.6  CL 93* 92* 93* 95* 99*  CO2 38* 37* 38* 35* 37*  GLUCOSE 121* 130*  130* 132* 121*  BUN  21* 20 21* 24* 18  CREATININE 1.09* 0.96 1.00 0.79 0.68  CALCIUM 8.7* 8.7* 8.7* 8.4* 8.3*  MG  --   --  2.2  --   --    Liver Function Tests: No results for input(s): AST, ALT, ALKPHOS, BILITOT, PROT, ALBUMIN in the last 168 hours. No results for input(s): LIPASE, AMYLASE in the last 168 hours. No results for input(s): AMMONIA in the last 168 hours. CBC:  Recent Labs Lab 01/05/15 1921 01/06/15 0317 01/07/15 0528  WBC 7.1 7.5 6.9  HGB 13.1 13.8 12.8  HCT 41.6 44.1 41.3  MCV 86.8 87.5 86.4  PLT 219 208 217   Cardiac Enzymes: No results for input(s): CKTOTAL, CKMB, CKMBINDEX, TROPONINI in the last 168 hours. BNP: BNP (last 3 results)  Recent Labs  12/29/14 2249 01/03/15 0353  BNP 716.0* 1027.9*    ProBNP (last 3 results) No results for input(s): PROBNP in the last 8760 hours.  CBG:  Recent Labs Lab 01/06/15 1302 01/06/15 1710 01/06/15 2109 01/07/15 0638 01/07/15 1032  GLUCAP 116* 146* 170* 119* 132*       Signed:  Rainee Sweatt  [facility] 01/07/2015, 12:22 PM

## 2015-01-07 NOTE — Care Management Important Message (Signed)
Important Message  Patient Details  Name: [name] MRN: [id number] Date of Birth: 06/30/1931   Medicare Important Message Given:  Yes-fourth notification given    Yvonna Alanis 01/07/2015, 1:42 PM

## 2015-01-07 NOTE — Clinical Social Work Placement (Signed)
   CLINICAL SOCIAL WORK PLACEMENT  NOTE  Date:  01/07/2015  Patient Details  Name: [name] MRN: [id number] Date of Birth: Sep 19, 1931  Clinical Social Work is seeking post-discharge placement for this patient at the Skilled  Nursing Facility level of care (*CSW will initial, date and re-position this form in  chart as items are completed):  Yes   Patient/family provided with [facility] Department's list of facilities offering this level of care within the geographic area requested by the patient (or if unable, by the patient's family).  Yes   Patient/family informed of their freedom to choose among providers that offer the needed level of care, that participate in Medicare, Medicaid or managed care program needed by the patient, have an available bed and are willing to accept the patient.  Yes   Patient/family informed of [facility]'s ownership interest in [facility] and [facility], as well as of the fact that they are under no obligation to receive care at these facilities.  PASRR submitted to EDS on       PASRR number received on       Existing PASRR number confirmed on 01/01/15     FL2 transmitted to all facilities in geographic area requested by pt/family on 01/01/15     FL2 transmitted to all facilities within larger geographic area on       Patient informed that his/her managed care company has contracts with or will negotiate with certain facilities, including the following:        Yes   Patient/family informed of bed offers received.  Patient chooses bed at [facility]     Physician recommends and patient chooses bed at      Patient to be transferred to [facility] on 01/07/15.  Patient to be transferred to facility by [facility]     Patient family notified on 01/07/15 of transfer.  Name of family member notified:  Dessiree Portalatin (son)     PHYSICIAN Please sign FL2     Additional Comment:     _______________________________________________ Sharol Harness, Theresia Majors [phone]

## 2015-01-07 NOTE — Progress Notes (Signed)
DAILY PROGRESS NOTE  Subjective:  Cath results reviewed, non-obstructive mild coronary disease.  Pt went into rapid (134) with hypotension yesterday PM. DC delayed to today if stable.   Objective:  Temp:  [97.9 F (36.6 C)-98.5 F (36.9 C)] 97.9 F (36.6 C) (07/21 0502) Pulse Rate:  [0-224] 63 (07/21 0502) Resp:  [0-26] 18 (07/21 0502) BP: (97-154)/(42-100) 117/75 mmHg (07/21 0502) SpO2:  [0 %-100 %] 96 % (07/21 0502) Weight:  [169 lb 4.8 oz (76.794 kg)] 169 lb 4.8 oz (76.794 kg) (07/21 0502) Weight change: 12.8 oz (0.363 kg)  Intake/Output from previous day: 07/20 0701 - 07/21 0700 In: 690.4 [P.O.:300; I.V.:390.4] Out: -   Intake/Output from this shift:    Medications: Current Facility-Administered Medications  Medication Dose Route Frequency Provider Last Rate Last Dose  . 0.9 %  sodium chloride infusion  250 mL Intravenous PRN Allyne Gee, MD      . 0.9 %  sodium chloride infusion  250 mL Intravenous PRN Troy Sine, MD      . 0.9 %  sodium chloride infusion   Intravenous Continuous Erlene Quan, PA-C   Stopped at 01/06/15 1712  . acetaminophen (TYLENOL) tablet 650 mg  650 mg Oral Q4H PRN Allyne Gee, MD   650 mg at 01/03/15 5093  . apixaban (ELIQUIS) tablet 5 mg  5 mg Oral BID Troy Sine, MD   5 mg at 01/07/15 1006  . calcium-vitamin D (OSCAL WITH D) 500-200 MG-UNIT per tablet 1 tablet  1 tablet Oral BID WC Barton Dubois, MD   1 tablet at 01/07/15 1006  . carvedilol (COREG) tablet 3.125 mg  3.125 mg Oral BID WC Barton Dubois, MD   3.125 mg at 01/07/15 1006  . docusate sodium (COLACE) capsule 100 mg  100 mg Oral BID Kelvin Cellar, MD   100 mg at 01/07/15 1006  . furosemide (LASIX) tablet 40 mg  40 mg Oral BID Pixie Casino, MD   40 mg at 01/07/15 1006  . insulin aspart (novoLOG) injection 0-15 Units  0-15 Units Subcutaneous TID WC Allyne Gee, MD   3 Units at 01/06/15 1807  . lactulose (CHRONULAC) 10 GM/15ML solution 20 g  20 g Oral Daily Kelvin Cellar, MD   20 g at 01/07/15 1005  . lisinopril (PRINIVIL,ZESTRIL) tablet 5 mg  5 mg Oral Daily Allyne Gee, MD   5 mg at 01/07/15 1005  . methimazole (TAPAZOLE) tablet 5 mg  5 mg Oral Once per day on Mon Wed Fri Allyne Gee, MD   Stopped at 01/06/15 1037  . nitroGLYCERIN (NITROSTAT) SL tablet 0.4 mg  0.4 mg Sublingual Q5 min PRN Allyne Gee, MD   0.4 mg at 01/04/15 1202  . ondansetron (ZOFRAN) injection 4 mg  4 mg Intravenous Q6H PRN Allyne Gee, MD      . pantoprazole (PROTONIX) EC tablet 40 mg  40 mg Oral Q0600 Allyne Gee, MD   40 mg at 01/07/15 0544  . polyethylene glycol (MIRALAX / GLYCOLAX) packet 17 g  17 g Oral Daily PRN Allyne Gee, MD      . sertraline (ZOLOFT) tablet 50 mg  50 mg Oral Daily Allyne Gee, MD   50 mg at 01/07/15 1005  . simvastatin (ZOCOR) tablet 40 mg  40 mg Oral q1800 Allyne Gee, MD   40 mg at 01/06/15 1809  . sodium chloride 0.9 % injection 3 mL  3 mL Intravenous Q12H Allyne Gee, MD   3 mL at 01/06/15 1323  . sodium chloride 0.9 % injection 3 mL  3 mL Intravenous PRN Allyne Gee, MD      . sodium chloride 0.9 % injection 3 mL  3 mL Intravenous Q12H Troy Sine, MD   3 mL at 01/07/15 1007  . sodium chloride 0.9 % injection 3 mL  3 mL Intravenous PRN Troy Sine, MD      . vitamin C (ASCORBIC ACID) tablet 500 mg  500 mg Oral Daily Allyne Gee, MD   500 mg at 01/07/15 1005  . zolpidem (AMBIEN) tablet 5 mg  5 mg Oral QHS PRN Allyne Gee, MD   5 mg at 01/04/15 2351    Physical Exam: General appearance: obese, sleepy this am, NAD Lungs: decreased breath sounds (poor effort) Heart: regular rate and rhythm, S1, S2 normal, 2/6 systolic murmur AOV Extremities: extremities normal, atraumatic, no cyanosis or edema Pulses: 2+ and symmetric  Lab Results: Results for orders placed or performed during the hospital encounter of 12/29/14 (from the past 48 hour(s))  Glucose, capillary     Status: Abnormal   Collection Time: 01/05/15 11:20 AM    Result Value Ref Range   Glucose-Capillary 133 (H) 65 - 99 mg/dL   Comment 1 Notify RN   Glucose, capillary     Status: Abnormal   Collection Time: 01/05/15  4:21 PM  Result Value Ref Range   Glucose-Capillary 180 (H) 65 - 99 mg/dL  CBC     Status: None   Collection Time: 01/05/15  7:21 PM  Result Value Ref Range   WBC 7.1 4.0 - 10.5 K/uL   RBC 4.79 3.87 - 5.11 MIL/uL   Hemoglobin 13.1 12.0 - 15.0 g/dL   HCT 41.6 36.0 - 46.0 %   MCV 86.8 78.0 - 100.0 fL   MCH 27.3 26.0 - 34.0 pg   MCHC 31.5 30.0 - 36.0 g/dL   RDW 15.5 11.5 - 15.5 %   Platelets 219 150 - 400 K/uL  Protime-INR     Status: Abnormal   Collection Time: 01/05/15  7:21 PM  Result Value Ref Range   Prothrombin Time 17.9 (H) 11.6 - 15.2 seconds   INR 1.47 0.00 - 1.49  Glucose, capillary     Status: Abnormal   Collection Time: 01/05/15  8:30 PM  Result Value Ref Range   Glucose-Capillary 158 (H) 65 - 99 mg/dL   Comment 1 Notify RN    Comment 2 Document in Chart   Basic metabolic panel     Status: Abnormal   Collection Time: 01/06/15  3:17 AM  Result Value Ref Range   Sodium 140 135 - 145 mmol/L   Potassium 3.8 3.5 - 5.1 mmol/L   Chloride 95 (L) 101 - 111 mmol/L   CO2 35 (H) 22 - 32 mmol/L   Glucose, Bld 132 (H) 65 - 99 mg/dL   BUN 24 (H) 6 - 20 mg/dL   Creatinine, Ser 0.79 0.44 - 1.00 mg/dL   Calcium 8.4 (L) 8.9 - 10.3 mg/dL   GFR calc non Af Amer >60 >60 mL/min   GFR calc Af Amer >60 >60 mL/min    Comment: (NOTE) The eGFR has been calculated using the CKD EPI equation. This calculation has not been validated in all clinical situations. eGFR's persistently <60 mL/min signify possible Chronic Kidney Disease.    Anion gap 10 5 - 15  Protime-INR  Status: Abnormal   Collection Time: 01/06/15  3:17 AM  Result Value Ref Range   Prothrombin Time 17.9 (H) 11.6 - 15.2 seconds   INR 1.47 0.00 - 1.49  CBC     Status: Abnormal   Collection Time: 01/06/15  3:17 AM  Result Value Ref Range   WBC 7.5 4.0 - 10.5  K/uL   RBC 5.04 3.87 - 5.11 MIL/uL   Hemoglobin 13.8 12.0 - 15.0 g/dL   HCT 44.1 36.0 - 46.0 %   MCV 87.5 78.0 - 100.0 fL   MCH 27.4 26.0 - 34.0 pg   MCHC 31.3 30.0 - 36.0 g/dL   RDW 15.8 (H) 11.5 - 15.5 %   Platelets 208 150 - 400 K/uL  Glucose, capillary     Status: Abnormal   Collection Time: 01/06/15  6:12 AM  Result Value Ref Range   Glucose-Capillary 117 (H) 65 - 99 mg/dL   Comment 1 Notify RN    Comment 2 Document in Chart   Glucose, capillary     Status: Abnormal   Collection Time: 01/06/15  9:40 AM  Result Value Ref Range   Glucose-Capillary 128 (H) 65 - 99 mg/dL   Comment 1 Notify RN   Glucose, capillary     Status: Abnormal   Collection Time: 01/06/15  1:02 PM  Result Value Ref Range   Glucose-Capillary 116 (H) 65 - 99 mg/dL   Comment 1 Notify RN    Comment 2 Document in Chart   Glucose, capillary     Status: Abnormal   Collection Time: 01/06/15  5:10 PM  Result Value Ref Range   Glucose-Capillary 146 (H) 65 - 99 mg/dL   Comment 1 Notify RN    Comment 2 Document in Chart   Glucose, capillary     Status: Abnormal   Collection Time: 01/06/15  9:09 PM  Result Value Ref Range   Glucose-Capillary 170 (H) 65 - 99 mg/dL   Comment 1 Notify RN    Comment 2 Document in Chart   MRSA PCR Screening     Status: None   Collection Time: 01/06/15 11:41 PM  Result Value Ref Range   MRSA by PCR NEGATIVE NEGATIVE    Comment:        The GeneXpert MRSA Assay (FDA approved for NASAL specimens only), is one component of a comprehensive MRSA colonization surveillance program. It is not intended to diagnose MRSA infection nor to guide or monitor treatment for MRSA infections.   CBC     Status: Abnormal   Collection Time: 01/07/15  5:28 AM  Result Value Ref Range   WBC 6.9 4.0 - 10.5 K/uL   RBC 4.78 3.87 - 5.11 MIL/uL   Hemoglobin 12.8 12.0 - 15.0 g/dL   HCT 41.3 36.0 - 46.0 %   MCV 86.4 78.0 - 100.0 fL   MCH 26.8 26.0 - 34.0 pg   MCHC 31.0 30.0 - 36.0 g/dL   RDW 15.8  (H) 11.5 - 15.5 %   Platelets 217 150 - 400 K/uL  Basic metabolic panel     Status: Abnormal   Collection Time: 01/07/15  5:28 AM  Result Value Ref Range   Sodium 142 135 - 145 mmol/L   Potassium 3.6 3.5 - 5.1 mmol/L   Chloride 99 (L) 101 - 111 mmol/L   CO2 37 (H) 22 - 32 mmol/L   Glucose, Bld 121 (H) 65 - 99 mg/dL   BUN 18 6 - 20 mg/dL   Creatinine,  Ser 0.68 0.44 - 1.00 mg/dL   Calcium 8.3 (L) 8.9 - 10.3 mg/dL   GFR calc non Af Amer >60 >60 mL/min   GFR calc Af Amer >60 >60 mL/min    Comment: (NOTE) The eGFR has been calculated using the CKD EPI equation. This calculation has not been validated in all clinical situations. eGFR's persistently <60 mL/min signify possible Chronic Kidney Disease.    Anion gap 6 5 - 15  Glucose, capillary     Status: Abnormal   Collection Time: 01/07/15  6:38 AM  Result Value Ref Range   Glucose-Capillary 119 (H) 65 - 99 mg/dL  Glucose, capillary     Status: Abnormal   Collection Time: 01/07/15 10:32 AM  Result Value Ref Range   Glucose-Capillary 132 (H) 65 - 99 mg/dL    Imaging: CXR 12/29/14- NAD    Assessment:  Principal Problem:   SOB (shortness of breath)   Active Problems:   Atrial fibrillation with RVR   Acute on chronic combined systolic and diastolic CHF    Essential hypertension   Elevated troponin   NICM (nonischemic cardiomyopathy)   Normal coronary arteries   PACEM[employer]ER-[employer]   HLD (hyperlipidemia)   Plan: No further PSVT. Current rhythm paced. Her B/P is a little soft, not sure we can increase Coreg, ? add Lanoxin.    Jodel Mayhall K PA 01/07/2015, 11:18 AM

## 2015-01-11 ENCOUNTER — Ambulatory Visit: Payer: Medicare Other | Admitting: Internal Medicine

## 2015-01-12 ENCOUNTER — Telehealth: Payer: Self-pay | Admitting: Internal Medicine

## 2015-01-12 NOTE — Telephone Encounter (Signed)
Closed encounter °

## 2015-01-13 ENCOUNTER — Other Ambulatory Visit: Payer: Self-pay

## 2015-01-13 MED ORDER — LISINOPRIL 5 MG PO TABS: 2.5000 mg | ORAL_TABLET | Freq: Every day | ORAL | Status: DC

## 2015-01-13 MED ORDER — SIMVASTATIN 40 MG PO TABS: 40.0000 mg | ORAL_TABLET | Freq: Every day | ORAL | Status: DC

## 2015-01-17 ENCOUNTER — Other Ambulatory Visit: Payer: Self-pay | Admitting: Internal Medicine

## 2015-01-21 ENCOUNTER — Ambulatory Visit (INDEPENDENT_AMBULATORY_CARE_PROVIDER_SITE_OTHER): Payer: Medicare Other | Admitting: Nurse Practitioner

## 2015-01-21 ENCOUNTER — Encounter: Payer: Self-pay | Admitting: Nurse Practitioner

## 2015-01-21 VITALS — BP 118/68 | HR 66 | Ht 62.0 in | Wt 168.0 lb

## 2015-01-21 DIAGNOSIS — I5032 Chronic diastolic (congestive) heart failure: Secondary | ICD-10-CM

## 2015-01-21 DIAGNOSIS — E7800 Pure hypercholesterolemia, unspecified: Secondary | ICD-10-CM

## 2015-01-21 DIAGNOSIS — I2510 Atherosclerotic heart disease of native coronary artery without angina pectoris: Secondary | ICD-10-CM | POA: Diagnosis not present

## 2015-01-21 DIAGNOSIS — I480 Paroxysmal atrial fibrillation: Secondary | ICD-10-CM

## 2015-01-21 DIAGNOSIS — I471 Supraventricular tachycardia, unspecified: Secondary | ICD-10-CM

## 2015-01-21 DIAGNOSIS — I4891 Unspecified atrial fibrillation: Secondary | ICD-10-CM | POA: Diagnosis not present

## 2015-01-21 DIAGNOSIS — I5043 Acute on chronic combined systolic (congestive) and diastolic (congestive) heart failure: Secondary | ICD-10-CM | POA: Diagnosis not present

## 2015-01-21 DIAGNOSIS — I5022 Chronic systolic (congestive) heart failure: Secondary | ICD-10-CM | POA: Insufficient documentation

## 2015-01-21 DIAGNOSIS — I10 Essential (primary) hypertension: Secondary | ICD-10-CM

## 2015-01-21 DIAGNOSIS — I5042 Chronic combined systolic (congestive) and diastolic (congestive) heart failure: Secondary | ICD-10-CM

## 2015-01-21 DIAGNOSIS — I639 Cerebral infarction, unspecified: Secondary | ICD-10-CM

## 2015-01-21 NOTE — Patient Instructions (Signed)
Medication Instructions:   Your physician recommends that you continue on your current medications as directed. Please refer to the Current Medication list given to you today.    Labwork:  BMET   Testing/Procedures:  NONE ORDER TODAY   Follow-Up:  IN 4 TO 6 WEEKS WITH DR ROSS    Any Other Special Instructions Will Be Listed Below (If Applicable).

## 2015-01-21 NOTE — Progress Notes (Signed)
Patient Name: [name] Date of Encounter: 01/21/2015  Primary Care Provider:  [name], NP Primary Cardiologist:  Lovina Reach, MD   Chief Complaint  81 year old female who was recently admitted to [facility] with acute systolic heart failure who presents for follow-up.  Past Medical History   Past Medical History  Diagnosis Date  . Lumbar back pain   . CAD (coronary artery disease)     a. s/p prior RCA stenting;  b. 12/2014 Cath: LM nl, LAD 25p, LCX 20p/m, RCA 25ost, 20d ISR, EF 25-30%.  . Memory loss   . DJD (degenerative joint disease)   . Depression   . Essential hypertension   . CVA (cerebral infarction)   . Chronic systolic CHF (congestive heart failure)     a. 12/2014 Echo: EF 25%, mild LVH, mod MR, sev dil LA, mildly dil RA, PASP .  Marland Kitchen NICM (nonischemic cardiomyopathy)     a. 07/2014 EF 50%;  b. 12/2014 Echo: EF 25%.  . Presence of permanent cardiac pacemaker     a. 10/2009 s/p MDT [id number] Adapta DC PPM, Ser # [id number] H.  . Osteoporosis   . Hypercholesterolemia   . Hyperthyroidism   . Anxiety   . Vertigo   . Tachy-brady syndrome     a. 10/2009 s/p MDT PPM.  . TIA (transient ischemic attack)   . PSVT (paroxysmal supraventricular tachycardia)     a. 12/2009 while in hospital.   Past Surgical History  Procedure Laterality Date  . Pacemaker insertion  2011  . Total knee arthroplasty  2002    bilateral  . Abdominal hysterectomy  1986  . Bilateral stenting  1999    to the RCA  . Cardiac catheterization  05/23/10  . Total abdominal hysterectomy w/ bilateral salpingoophorectomy    . Cardiac catheterization N/A 01/06/2015    Procedure: Left Heart Cath and Coronary Angiography;  Surgeon: Lennette Bihari, MD;  Location: [facility] INVASIVE CV LAB;  Service: Cardiovascular;  Laterality: N/A;    Allergies  Allergies  Allergen Reactions  . Namenda [Memantine Hcl] Other (See Comments)    Dizzy  . Iodine Other (See Comments)    unknown  . Iohexol Hives   Code: HIVES, Desc: hives with nonionic contrast 7 yrs ago ([place] iv benedryl given   . Peanut-Containing Drug Products Other (See Comments)    unknown    HPI  81 year old female with a prior history of coronary artery disease as well as paroxysmal atrial fibrillation, and strokes. She is on chronic eliquis anticoagulation. Per her son, she has been experiencing increasing dyspnea and lower extremity edema for the past few months and was subsequently admitted to [facility] due to heart failure. On admission, she was diuresed and lost approximately 20 pounds. Echo showed new LV dysfunction with an EF of 25%. Catheterization was subsequent performed revealing nonobstructive coronary artery disease with patent RCA stent. She has been medically managed with beta blocker and ACE inhibitor therapy was also been placed back on oral Lasix. She was discharged to rehabilitation and has since been doing reasonably well. Her son is with her today and translates. Per her son, she has not had any chest pain or dyspnea. She has not had any recurrence of edema and her weight has been stable. He feels that the food at the nursing home is too salty that overall he is pleased that she has opportunity to rehabilitate. She spends most of her time either in a chair or bed.  Home  Medications  Prior to Admission medications   Medication Sig Start Date End Date Taking? Authorizing Provider  alendronate (FOSAMAX) 70 MG tablet Take 1 tablet (70 mg total) by mouth once a week. Take with a full glass of water on an empty stomach. 10/08/14  Yes [name], NP  apixaban (ELIQUIS) 5 MG TABS tablet Take 1 tablet (5 mg total) by mouth 2 (two) times daily. 09/28/14  Yes Pricilla Riffle, MD  Ascorbic Acid (VITAMIN C) 500 MG tablet Take 500 mg by mouth daily.     Yes Historical Provider, MD  Calcium Carbonate-Vit D-Min 600-400 MG-UNIT TABS Take 1 tablet by mouth 2 (two) times daily.    Yes Historical Provider, MD  carvedilol (COREG) 6.25  MG tablet Take 1 tablet (6.25 mg total) by mouth 2 (two) times daily with a meal. 01/07/15  Yes Jeralyn Bennett, MD  furosemide (LASIX) 40 MG tablet Take 1 tablet (40 mg total) by mouth 2 (two) times daily. 01/07/15  Yes Jeralyn Bennett, MD  lisinopril (PRINIVIL,ZESTRIL) 5 MG tablet Take 0.5 tablets (2.5 mg total) by mouth daily. 01/13/15  Yes Pricilla Riffle, MD  methimazole (TAPAZOLE) 5 MG tablet Take one tablet by mouth three days a week as prescribed by endocrinology 10/08/14  Yes [name], NP  Multiple Vitamins-Minerals (CENTRUM SILVER) tablet Take 1 tablet by mouth daily.     Yes Historical Provider, MD  nitroGLYCERIN (NITROSTAT) 0.4 MG SL tablet Place 1 tablet (0.4 mg total) under the tongue every 5 (five) minutes as needed. For chest pain 03/31/14  Yes Mahima Glade Lloyd, MD  pantoprazole (PROTONIX) 40 MG tablet Take 1 tablet (40 mg total) by mouth daily at 6 (six) AM. 09/28/14  Yes Pricilla Riffle, MD  polyethylene glycol (MIRALAX / GLYCOLAX) packet Take 17 g by mouth daily as needed for moderate constipation.   Yes Historical Provider, MD  potassium chloride (K-DUR) 10 MEQ tablet Take 1 tablet (10 mEq total) by mouth daily. 01/07/15  Yes Jeralyn Bennett, MD  sertraline (ZOLOFT) 50 MG tablet Take 1 tablet (50 mg total) by mouth daily. 11/18/14  Yes Marvel Plan, MD  simvastatin (ZOCOR) 40 MG tablet Take 1 tablet (40 mg total) by mouth daily at 6 PM. 01/13/15  Yes Pricilla Riffle, MD    Review of Systems  As above, patient's son says she is doing well without chest pain, dyspnea, PND, orthopnea, dizziness, syncope, edema, or early satiety. She is very sedentary, spending most of her time in a wheelchair or bed.  All other systems reviewed and are otherwise negative except as noted above.  Physical Exam  VS:  BP 118/68 mmHg  Pulse 66  Ht  (1.575 m)  Wt 168 lb (76.204 kg)  BMI 30.72 kg/m2 , BMI Body mass index is 30.72 kg/(m^2). GEN: Well nourished, well developed, in no acute distress. HEENT:  normal. Neck: Supple, no JVD, carotid bruits, or masses. Cardiac: RRR, soft systolic murmur at base, no rubs, or gallops. No clubbing, cyanosis, edema.  Radials/DP/PT 2+ and equal bilaterally.  Respiratory:  Respirations regular and unlabored, clear to auscultation bilaterally. GI: Soft, nontender, nondistended, BS + x 4. MS: no deformity or atrophy. Skin: warm and dry, no rash. Neuro:  Strength and sensation are intact. Psych: Normal affect.  Accessory Clinical Findings  ECG - a paced, V sensed, 66, left axis, nonspecific T changes. No acute ST or T changes.  Assessment & Plan  1.  Chronic systolic congestive heart failure/mixed ischemic  and nonischemic cardiomyopathy: Patient was recently admitted with heart failure and volume overload. EF was found to be reduced to 25%, which is a new finding. Catheterization showed patent RCA stent and otherwise nonobstructive disease. She is being medically managed with beta blocker and ACE inhibitor therapy and was placed back on oral diuretic.  Her weight has been stable at the skilled nursing facility and she has not had any recurrence of dyspnea. I will check a basic metabolic panel today. I did briefly discuss repeating her echocardiogram in approximately 40 days to evaluate LV function and determine her candidacy for ICD therapy. Patient's son does not think that she is a good candidate for an ICD she is fairly debilitated, does not walk on her own, and is more or less bed/chair ridden. I agree with that assessment.  2. Coronary artery disease: Status post recent catheterization revealing stable anatomy with patent RCA stent. Continue medical therapy including beta blocker, ACE inhibitor, and statin. She is not on aspirin as she is on eliquis.  3. Paroxysmal atrial fibrillation: She is in sinus rhythm today. She remains on beta blocker and eliquis therapy.  4. Paroxysmal supraventricular tachycardia: This was noted during her hospitalization.  Continue beta blocker therapy. She has not had any recurrence of palpitations.  5. Essential hypertension: Blood pressure is stable on beta blocker and ACE inhibitor therapy.  6. Hyperlipidemia: Continue simvastatin. LDL was 83 in March.  7. Disposition: Follow-up basic metabolic panel today. Follow-up with Dr. Tenny Craw in 4-6 weeks.    Nicolasa Ducking, NP 01/21/2015, 4:26 PM

## 2015-01-22 ENCOUNTER — Telehealth: Payer: Self-pay | Admitting: Internal Medicine

## 2015-01-22 LAB — BASIC METABOLIC PANEL
BUN: 25 mg/dL — AB (ref 6–23)
CO2: 35 mEq/L — ABNORMAL HIGH (ref 19–32)
Calcium: 9.2 mg/dL (ref 8.4–10.5)
Chloride: 100 mEq/L (ref 96–112)
Creatinine, Ser: 0.8 mg/dL (ref 0.40–1.20)
GFR: 73.21 mL/min (ref >60.00)
Glucose, Bld: 65 mg/dL — ABNORMAL LOW (ref 70–99)
Potassium: 3.9 mEq/L (ref 3.5–5.1)
Sodium: 143 mEq/L (ref 135–145)

## 2015-01-22 NOTE — Telephone Encounter (Signed)
Stable lab results reviewed. No further questions from son.

## 2015-01-22 NOTE — Telephone Encounter (Signed)
New message     Pt son returning call to office about lab results

## 2015-02-16 ENCOUNTER — Telehealth: Payer: Self-pay | Admitting: Internal Medicine

## 2015-02-16 NOTE — Telephone Encounter (Signed)
Left voicemail for patient's son. Adv I will try to call him back tomorrow.

## 2015-02-16 NOTE — Telephone Encounter (Signed)
New Message  Pt calling to speak w/ RN concerning pt's fluid retention. Pt has appt w/ Dr Tenny Craw on 10/3. Please call back and discuss.

## 2015-02-17 NOTE — Telephone Encounter (Signed)
Stop afternoon dose of lasix. Cut coreg to 3.125 2x per day Call in a couple wks with BP and swelling

## 2015-02-17 NOTE — Telephone Encounter (Signed)
Patient is taking 40 mg lasix bid She has no edema (wears compression stockings) No SOB.  She maintains her weight between 164-162 lbs.   Pt up through the night urinating and also soaking her bed with urine. Her BP drops shortly after am meds from 130s/80s to 90s/60s.  He wants to know if this is too much lasix for patient now.   Reviewed other medications that could affect her blood pressure and advised to give 2nd dose of lasix earlier in the day (around 2:30-3pm)  He is aware that I will forward to Dr. Tenny Craw for her review and recommendations and will call him back with any recommended changes.

## 2015-02-18 MED ORDER — CARVEDILOL 3.125 MG PO TABS: 3.1250 mg | ORAL_TABLET | Freq: Two times a day (BID) | ORAL | Status: AC

## 2015-02-18 MED ORDER — FUROSEMIDE 40 MG PO TABS: 40.0000 mg | ORAL_TABLET | Freq: Every day | ORAL | Status: DC

## 2015-02-18 NOTE — Telephone Encounter (Signed)
Spoke with [name] pt's son designated care taker, of Dr. Tenny Craw' recommendations. Pt to take 40 mg Lasix AM only. Decreased Coreg to 3.125 mg twice a day. Pt to call 2 weeks of BP readings and edema status. Pt's son verbalized understanding.

## 2015-03-04 ENCOUNTER — Telehealth: Payer: Self-pay | Admitting: Internal Medicine

## 2015-03-04 NOTE — Telephone Encounter (Signed)
New message    Pt weight went back up a little bit; pt weighs 169.5 right now; pt had gotten down to 162 a few weeks ago PT was started her back on lasix to stabilize weight Pt is also getting up 3-4 times during the night; son is wanting to know if she could have something to help her sleep Pt has been "down" (not as energetic) the last couple of days

## 2015-03-04 NOTE — Telephone Encounter (Signed)
See other phone encounter from same date.

## 2015-03-04 NOTE — Telephone Encounter (Signed)
Called and spoke to patient's son [name].  Update on patient after decreasing lasix and coreg on 02/17/15. She is having difficulty walking; weaker. Weight had increased to 169.5 so on 9/12 they increased lasix back to 40 mg BID from daily. Her lower extremities are swollen and she is SOB.  Coreg was decreased to 3.125 at that time also, wants to know if this should be increased back to 6.25 BID. Her blood pressures were 90s systolic, now running 140s/90s.  Also requests sometime to help patient sleep longer at night. She sleeps 3-4 straight hours and then is just staying up or up and down all night after that. I suggested PCP manage this but patient's son states that everyone has decided that they prefer Dr. Tenny Craw manage his mother's medicines.  He is aware that I am forwarding to Dr. Tenny Craw for recommendations and will call him back with update. Pt has appointment 10/3 with NP.

## 2015-03-04 NOTE — Telephone Encounter (Signed)
Pt is becoming weaker with walking. Family has noticed this also. Family started her back on furosemide. Family stopped this medication about 72mo ago. They started her back on it because of increased weakness, sob at rest. Wt is between 168 and 169 since 02-22-15. Nurse saw pt today. She was sob and respir 28, bp 138/92. Son wanted to ask if miralax should be increased--no BM today. Calling to report medication change and get instructions about miralax.        CALL FROM [facility] ALSO--HER MESSAGE ABOVE.

## 2015-03-04 NOTE — Telephone Encounter (Signed)
New message      Pt is becoming weaker with walking. Family has noticed this also.  Family started her back on furosemide.  Family stopped this medication about 22mo ago.  They started her back on it because of increased weakness, sob at rest.  Wt is between 168 and 169 since 02-22-15. Nurse saw pt today.  She was sob and respir 28, bp 138/92.  Son wanted to ask if miralax should be increased--no BM today.  Calling to report medication change and get instructions about miralax.

## 2015-03-05 MED ORDER — FUROSEMIDE 40 MG PO TABS: 40.0000 mg | ORAL_TABLET | Freq: Every day | ORAL | Status: DC

## 2015-03-05 MED ORDER — FUROSEMIDE 40 MG PO TABS: 40.0000 mg | ORAL_TABLET | Freq: Two times a day (BID) | ORAL | Status: AC

## 2015-03-05 NOTE — Telephone Encounter (Signed)
I left msg on home phone  Aware of reinstitution of lasix If had questions or concerned I left msg that they call back today

## 2015-03-05 NOTE — Telephone Encounter (Signed)
Spoke with patient's son Ed. There was a mis communication between him and his dad.--- however-- The patient as of yesterday did increase lasix to BID.  Coreg dose staying same at 3.125 BID.  Pts son asked if they could give a whole pill (6.25) daily.  Educated on this beta blocker and importance of Q 12 hr doses.  Verbalizes understanding.  In regard to her taking something for sleep--I suggested trying tylenol before bed.  He asked about benadryl, advised that can make her drowsy during day or can opposite effect and keep her awake.     The patient's son requests again that I ask Dr. Tenny Craw for a medicine for her to stay asleep at night.

## 2015-03-08 ENCOUNTER — Other Ambulatory Visit: Payer: Self-pay | Admitting: Internal Medicine

## 2015-03-12 ENCOUNTER — Telehealth: Payer: Self-pay | Admitting: Internal Medicine

## 2015-03-12 NOTE — Telephone Encounter (Signed)
New message      Pt c/o Shortness Of Breath: STAT if SOB developed within the last 24 hours or pt is noticeably SOB on the phone  1. Are you currently SOB (can you hear that pt is SOB on the phone)?  Not sure talking to son  2. How long have you been experiencing SOB? Last few days  3. Are you SOB when sitting or when up moving around? Pt is in bed  4. Are you currently experiencing any other symptoms? Sweating and diarrhea, fatigue and headache

## 2015-03-12 NOTE — Telephone Encounter (Signed)
He states this week the patient has been increasingly weak, SOB with minimal exertion and sweating a lot, with today being the worst day this week.   He is unsure if she has a fever, he has just left their house.  He states her abd was very hard all day, she had a bm that was diarrhea.  The patient does not voice chest pain. His concern is that she may be having a heart attack, because her pumping function is poor.  He is unsure if he should have her evaluated at ED.  Last week the physical therapist reported increase in weakness, difficulty with ambulation. Her weight is 164-166 lb, she has no increase in edema, but worsening SOB.  Advised patient's son to take her to ED for eval, her condition is worse overall and he is worried that she is having a heart attack.

## 2015-03-12 NOTE — Telephone Encounter (Signed)
Call transferred into triage. Spoke with the patients son, Ed.

## 2015-03-13 ENCOUNTER — Emergency Department (HOSPITAL_COMMUNITY): Payer: Medicare Other

## 2015-03-13 ENCOUNTER — Inpatient Hospital Stay (HOSPITAL_COMMUNITY)
Admission: EM | Admit: 2015-03-13 | Discharge: 2015-03-18 | DRG: 291 | Disposition: A | Discharge status: Skilled Nursing Facility | Payer: Medicare Other | Attending: Internal Medicine | Admitting: Internal Medicine

## 2015-03-13 ENCOUNTER — Encounter (HOSPITAL_COMMUNITY): Payer: Self-pay | Admitting: *Deleted

## 2015-03-13 DIAGNOSIS — F419 Anxiety disorder, unspecified: Secondary | ICD-10-CM | POA: Diagnosis present

## 2015-03-13 DIAGNOSIS — B9689 Other specified bacterial agents as the cause of diseases classified elsewhere: Secondary | ICD-10-CM | POA: Diagnosis present

## 2015-03-13 DIAGNOSIS — I10 Essential (primary) hypertension: Secondary | ICD-10-CM | POA: Diagnosis present

## 2015-03-13 DIAGNOSIS — M1990 Unspecified osteoarthritis, unspecified site: Secondary | ICD-10-CM | POA: Diagnosis present

## 2015-03-13 DIAGNOSIS — F329 Major depressive disorder, single episode, unspecified: Secondary | ICD-10-CM | POA: Diagnosis present

## 2015-03-13 DIAGNOSIS — G92 Toxic encephalopathy: Secondary | ICD-10-CM | POA: Diagnosis not present

## 2015-03-13 DIAGNOSIS — I471 Supraventricular tachycardia: Secondary | ICD-10-CM | POA: Diagnosis present

## 2015-03-13 DIAGNOSIS — Z8673 Personal history of transient ischemic attack (TIA), and cerebral infarction without residual deficits: Secondary | ICD-10-CM

## 2015-03-13 DIAGNOSIS — I495 Sick sinus syndrome: Secondary | ICD-10-CM | POA: Diagnosis present

## 2015-03-13 DIAGNOSIS — I480 Paroxysmal atrial fibrillation: Secondary | ICD-10-CM | POA: Diagnosis present

## 2015-03-13 DIAGNOSIS — R471 Dysarthria and anarthria: Secondary | ICD-10-CM | POA: Diagnosis not present

## 2015-03-13 DIAGNOSIS — I509 Heart failure, unspecified: Secondary | ICD-10-CM | POA: Diagnosis not present

## 2015-03-13 DIAGNOSIS — G9340 Encephalopathy, unspecified: Secondary | ICD-10-CM

## 2015-03-13 DIAGNOSIS — Z823 Family history of stroke: Secondary | ICD-10-CM

## 2015-03-13 DIAGNOSIS — R4781 Slurred speech: Secondary | ICD-10-CM | POA: Diagnosis not present

## 2015-03-13 DIAGNOSIS — N390 Urinary tract infection, site not specified: Secondary | ICD-10-CM | POA: Diagnosis present

## 2015-03-13 DIAGNOSIS — Z96653 Presence of artificial knee joint, bilateral: Secondary | ICD-10-CM | POA: Diagnosis present

## 2015-03-13 DIAGNOSIS — M810 Age-related osteoporosis without current pathological fracture: Secondary | ICD-10-CM | POA: Diagnosis present

## 2015-03-13 DIAGNOSIS — Z79899 Other long term (current) drug therapy: Secondary | ICD-10-CM

## 2015-03-13 DIAGNOSIS — Z950 Presence of cardiac pacemaker: Secondary | ICD-10-CM | POA: Diagnosis not present

## 2015-03-13 DIAGNOSIS — Z888 Allergy status to other drugs, medicaments and biological substances status: Secondary | ICD-10-CM | POA: Diagnosis not present

## 2015-03-13 DIAGNOSIS — E780 Pure hypercholesterolemia: Secondary | ICD-10-CM | POA: Diagnosis present

## 2015-03-13 DIAGNOSIS — Z90710 Acquired absence of both cervix and uterus: Secondary | ICD-10-CM

## 2015-03-13 DIAGNOSIS — G9341 Metabolic encephalopathy: Secondary | ICD-10-CM | POA: Diagnosis present

## 2015-03-13 DIAGNOSIS — I5023 Acute on chronic systolic (congestive) heart failure: Secondary | ICD-10-CM | POA: Diagnosis not present

## 2015-03-13 DIAGNOSIS — Z955 Presence of coronary angioplasty implant and graft: Secondary | ICD-10-CM | POA: Diagnosis not present

## 2015-03-13 DIAGNOSIS — Z91010 Allergy to peanuts: Secondary | ICD-10-CM

## 2015-03-13 DIAGNOSIS — I5021 Acute systolic (congestive) heart failure: Secondary | ICD-10-CM

## 2015-03-13 DIAGNOSIS — E0590 Thyrotoxicosis, unspecified without thyrotoxic crisis or storm: Secondary | ICD-10-CM | POA: Diagnosis present

## 2015-03-13 DIAGNOSIS — M545 Low back pain: Secondary | ICD-10-CM | POA: Diagnosis present

## 2015-03-13 DIAGNOSIS — I2510 Atherosclerotic heart disease of native coronary artery without angina pectoris: Secondary | ICD-10-CM | POA: Diagnosis present

## 2015-03-13 DIAGNOSIS — Z7901 Long term (current) use of anticoagulants: Secondary | ICD-10-CM | POA: Diagnosis not present

## 2015-03-13 DIAGNOSIS — Z8249 Family history of ischemic heart disease and other diseases of the circulatory system: Secondary | ICD-10-CM | POA: Diagnosis not present

## 2015-03-13 DIAGNOSIS — R4182 Altered mental status, unspecified: Secondary | ICD-10-CM

## 2015-03-13 DIAGNOSIS — Z91041 Radiographic dye allergy status: Secondary | ICD-10-CM | POA: Diagnosis not present

## 2015-03-13 DIAGNOSIS — Z90722 Acquired absence of ovaries, bilateral: Secondary | ICD-10-CM | POA: Diagnosis present

## 2015-03-13 DIAGNOSIS — I5043 Acute on chronic combined systolic (congestive) and diastolic (congestive) heart failure: Secondary | ICD-10-CM | POA: Diagnosis present

## 2015-03-13 DIAGNOSIS — I429 Cardiomyopathy, unspecified: Secondary | ICD-10-CM | POA: Diagnosis present

## 2015-03-13 DIAGNOSIS — R531 Weakness: Secondary | ICD-10-CM | POA: Diagnosis not present

## 2015-03-13 DIAGNOSIS — G928 Other toxic encephalopathy: Secondary | ICD-10-CM

## 2015-03-13 DIAGNOSIS — I5022 Chronic systolic (congestive) heart failure: Secondary | ICD-10-CM | POA: Diagnosis present

## 2015-03-13 DIAGNOSIS — M25561 Pain in right knee: Secondary | ICD-10-CM | POA: Diagnosis present

## 2015-03-13 LAB — CBC WITH DIFFERENTIAL/PLATELET
BASOS ABS: 0.1 10*3/uL (ref 0.0–0.1)
Basophils Relative: 1 %
EOS ABS: 0.1 10*3/uL (ref 0.0–0.7)
Eosinophils Relative: 2 %
HCT: 45.6 % (ref 36.0–46.0)
Hemoglobin: 14.3 g/dL (ref 12.0–15.0)
LYMPHS ABS: 1.8 10*3/uL (ref 0.7–4.0)
Lymphocytes Relative: 26 %
MCH: 27.3 pg (ref 26.0–34.0)
MCHC: 31.4 g/dL (ref 30.0–36.0)
MCV: 87 fL (ref 78.0–100.0)
Monocytes Absolute: 0.4 10*3/uL (ref 0.1–1.0)
Monocytes Relative: 6 %
Neutro Abs: 4.6 10*3/uL (ref 1.7–7.7)
Neutrophils Relative %: 65 %
PLATELETS: 234 10*3/uL (ref 150–400)
RBC: 5.24 MIL/uL — ABNORMAL HIGH (ref 3.87–5.11)
RDW: 22.2 % — ABNORMAL HIGH (ref 11.5–15.5)
WBC: 7 10*3/uL (ref 4.0–10.5)

## 2015-03-13 LAB — URINE MICROSCOPIC-ADD ON

## 2015-03-13 LAB — URINALYSIS, ROUTINE W REFLEX MICROSCOPIC
BILIRUBIN URINE: NEGATIVE
Glucose, UA: NEGATIVE mg/dL
HGB URINE DIPSTICK: NEGATIVE
KETONES UR: NEGATIVE mg/dL
Nitrite: NEGATIVE
PROTEIN: 100 mg/dL — AB
Specific Gravity, Urine: 1.019 (ref 1.005–1.030)
UROBILINOGEN UA: 0.2 mg/dL (ref 0.0–1.0)
pH: 6 (ref 5.0–8.0)

## 2015-03-13 LAB — TROPONIN I: TROPONIN I: 0.05 ng/mL — AB (ref <0.031)

## 2015-03-13 LAB — COMPREHENSIVE METABOLIC PANEL
ALT: 22 U/L (ref 14–54)
AST: 32 U/L (ref 15–41)
Albumin: 3.3 g/dL — ABNORMAL LOW (ref 3.5–5.0)
Alkaline Phosphatase: 66 U/L (ref 38–126)
Anion gap: 11 (ref 5–15)
BUN: 16 mg/dL (ref 6–20)
CO2: 31 mmol/L (ref 22–32)
Calcium: 9.1 mg/dL (ref 8.9–10.3)
Chloride: 101 mmol/L (ref 101–111)
Creatinine, Ser: 0.9 mg/dL (ref 0.44–1.00)
GFR calc Af Amer: >60 mL/min (ref >60)
GFR, EST NON AFRICAN AMERICAN: 59 mL/min — AB (ref >60)
Glucose, Bld: 140 mg/dL — ABNORMAL HIGH (ref 65–99)
Potassium: 3.8 mmol/L (ref 3.5–5.1)
Sodium: 143 mmol/L (ref 135–145)
Total Bilirubin: 0.9 mg/dL (ref 0.3–1.2)
Total Protein: 6.1 g/dL — ABNORMAL LOW (ref 6.5–8.1)

## 2015-03-13 LAB — BRAIN NATRIURETIC PEPTIDE: B Natriuretic Peptide: 1709.5 pg/mL — ABNORMAL HIGH (ref 0.0–100.0)

## 2015-03-13 LAB — PROTIME-INR
INR: 1.87 — AB (ref 0.00–1.49)
Prothrombin Time: 21.4 seconds — ABNORMAL HIGH (ref 11.6–15.2)

## 2015-03-13 MED ORDER — ONDANSETRON HCL 4 MG/2ML IJ SOLN: 4.0000 mg | Freq: Four times a day (QID) | INTRAMUSCULAR | Status: DC | PRN

## 2015-03-13 MED ORDER — ONDANSETRON HCL 4 MG PO TABS: 4.0000 mg | ORAL_TABLET | Freq: Four times a day (QID) | ORAL | Status: DC | PRN

## 2015-03-13 MED ORDER — METHIMAZOLE 10 MG PO TABS
10.0000 mg | ORAL_TABLET | ORAL | Status: DC
  Administered 2015-03-15 – 2015-03-17 (×2): 10 mg via ORAL
  Filled 2015-03-13 (×2): qty 1

## 2015-03-13 MED ORDER — SIMVASTATIN 40 MG PO TABS
40.0000 mg | ORAL_TABLET | Freq: Every day | ORAL | Status: DC
  Administered 2015-03-13 – 2015-03-17 (×5): 40 mg via ORAL
  Filled 2015-03-13 (×5): qty 1

## 2015-03-13 MED ORDER — SODIUM CHLORIDE 0.9 % IV SOLN: INTRAVENOUS | Status: DC

## 2015-03-13 MED ORDER — ACETAMINOPHEN 650 MG RE SUPP: 650.0000 mg | Freq: Four times a day (QID) | RECTAL | Status: DC | PRN

## 2015-03-13 MED ORDER — FUROSEMIDE 10 MG/ML IJ SOLN
60.0000 mg | Freq: Two times a day (BID) | INTRAMUSCULAR | Status: DC
  Administered 2015-03-13 – 2015-03-17 (×8): 60 mg via INTRAVENOUS
  Filled 2015-03-13 (×8): qty 6

## 2015-03-13 MED ORDER — LISINOPRIL 2.5 MG PO TABS
2.5000 mg | ORAL_TABLET | Freq: Every day | ORAL | Status: DC
  Administered 2015-03-13 – 2015-03-18 (×6): 2.5 mg via ORAL
  Filled 2015-03-13 (×6): qty 1

## 2015-03-13 MED ORDER — ACETAMINOPHEN 325 MG PO TABS: 650.0000 mg | ORAL_TABLET | Freq: Four times a day (QID) | ORAL | Status: DC | PRN

## 2015-03-13 MED ORDER — PANTOPRAZOLE SODIUM 40 MG PO TBEC
40.0000 mg | DELAYED_RELEASE_TABLET | Freq: Every day | ORAL | Status: DC
  Administered 2015-03-14 – 2015-03-18 (×5): 40 mg via ORAL
  Filled 2015-03-13 (×6): qty 1

## 2015-03-13 MED ORDER — DEXTROSE 5 % IV SOLN
1.0000 g | Freq: Once | INTRAVENOUS | Status: AC
  Administered 2015-03-13: 1 g via INTRAVENOUS
  Filled 2015-03-13: qty 10

## 2015-03-13 MED ORDER — POLYETHYLENE GLYCOL 3350 17 G PO PACK
17.0000 g | PACK | Freq: Every day | ORAL | Status: DC
  Administered 2015-03-14 – 2015-03-18 (×5): 17 g via ORAL
  Filled 2015-03-13 (×6): qty 1

## 2015-03-13 MED ORDER — DEXTROSE 5 % IV SOLN
1.0000 g | INTRAVENOUS | Status: DC
  Administered 2015-03-14 – 2015-03-17 (×4): 1 g via INTRAVENOUS
  Filled 2015-03-13 (×5): qty 10

## 2015-03-13 MED ORDER — CARVEDILOL 3.125 MG PO TABS
3.1250 mg | ORAL_TABLET | Freq: Two times a day (BID) | ORAL | Status: DC
  Administered 2015-03-13 – 2015-03-18 (×10): 3.125 mg via ORAL
  Filled 2015-03-13 (×10): qty 1

## 2015-03-13 MED ORDER — APIXABAN 5 MG PO TABS
5.0000 mg | ORAL_TABLET | Freq: Two times a day (BID) | ORAL | Status: DC
  Administered 2015-03-13 – 2015-03-18 (×10): 5 mg via ORAL
  Filled 2015-03-13 (×10): qty 1

## 2015-03-13 NOTE — ED Notes (Addendum)
Pt presents via [facility] with c/o generalized weakness x 3 days.  Per family pt has had increased weakness x 3 days, unable to go to bathroom without assistance, also found her soaked in urine in the bed which is abnormal for her, although incontinent at baseline.  No neuro deficits with EMS, generalized weakness.  EKG unremarkable, BP-146/94, P-73 R-16, CBG-165.

## 2015-03-13 NOTE — ED Notes (Signed)
Attempted report 

## 2015-03-13 NOTE — ED Notes (Signed)
Interpreter used at bedside, pt denies pain at this time or any needs, pt informed of transport upstairs.  Pt verbalized understanding.

## 2015-03-13 NOTE — Progress Notes (Signed)
Patient came to the floor around 1730, alert, possible confusion, difficult to understand patient, she speak [place] very little english, denies pain per phone interpreter. No shortness of breath, BP elevated. Apace on the monitor. Will continue to monitor patient.

## 2015-03-13 NOTE — ED Notes (Signed)
Family at bedside. 

## 2015-03-13 NOTE — ED Notes (Signed)
MD at bedside. 

## 2015-03-13 NOTE — ED Notes (Signed)
Son at bedside, states pt speech is very slurred to him, beginning yesterday.  MD aware.

## 2015-03-13 NOTE — ED Provider Notes (Signed)
CSN: [id number]     Arrival date & time 03/13/15  6213 History   First MD Initiated Contact with Patient 03/13/15 [phone]     Chief Complaint  Patient presents with  . Weakness     (Consider location/radiation/quality/duration/timing/severity/associated sxs/prior Treatment) HPI Comments: Level V caveat for altered mental status and language barrier. Patient from home with generalized weakness, increasing confusion, difficulty with urination, increasing shortness of breath. No known fevers. No reported chest pain. Patient confused at baseline and incontinent of urine. No focal neurological deficits. Patient with history of systolic heart failure, nonischemic cardiomyopathy status post pacemaker.  Patient is a 81 y.o. female presenting with weakness. The history is provided by the patient, a caregiver and the EMS personnel. The history is limited by the condition of the patient.  Weakness    Past Medical History  Diagnosis Date  . Lumbar back pain   . CAD (coronary artery disease)     a. s/p prior RCA stenting;  b. 12/2014 Cath: LM nl, LAD 25p, LCX 20p/m, RCA 25ost, 20d ISR, EF 25-30%.  . Memory loss   . DJD (degenerative joint disease)   . Depression   . Essential hypertension   . CVA (cerebral infarction)   . Chronic systolic CHF (congestive heart failure)     a. 12/2014 Echo: EF 25%, mild LVH, mod MR, sev dil LA, mildly dil RA, PASP .  Marland Kitchen NICM (nonischemic cardiomyopathy)     a. 07/2014 EF 50%;  b. 12/2014 Echo: EF 25%.  . Presence of permanent cardiac pacemaker     a. 10/2009 s/p MDT [id number] Adapta DC PPM, Ser # [id number] H.  . Osteoporosis   . Hypercholesterolemia   . Hyperthyroidism   . Anxiety   . Vertigo   . Tachy-brady syndrome     a. 10/2009 s/p MDT PPM.  . TIA (transient ischemic attack)   . PSVT (paroxysmal supraventricular tachycardia)     a. 12/2009 while in hospital.   Past Surgical History  Procedure Laterality Date  . Pacemaker insertion  2011  . Total knee  arthroplasty  2002    bilateral  . Abdominal hysterectomy  1986  . Bilateral stenting  1999    to the RCA  . Cardiac catheterization  05/23/10  . Total abdominal hysterectomy w/ bilateral salpingoophorectomy    . Cardiac catheterization N/A 01/06/2015    Procedure: Left Heart Cath and Coronary Angiography;  Surgeon: Lennette Bihari, MD;  Location: [facility] INVASIVE CV LAB;  Service: Cardiovascular;  Laterality: N/A;   Family History  Problem Relation Age of Onset  . Thyroid disease Daughter   . Diabetes Other   . Hypertension Other   . Arthritis Other   . Coronary artery disease Mother 70  . Coronary artery disease Father 51  . Heart attack Father   . Heart attack Sister   . Heart attack Brother   . Hypertension Sister   . Hypertension Brother   . Stroke Brother   . Stroke Sister    Social History  Substance Use Topics  . Smoking status: Never Smoker   . Smokeless tobacco: Never Used  . Alcohol Use: No   OB History    No data available     Review of Systems  Unable to perform ROS: Mental status change  Neurological: Positive for weakness.      Allergies  Namenda; Iodine; Iohexol; and Peanut-containing drug products  Home Medications   Prior to Admission medications   Medication Sig Start Date End  Date Taking? Authorizing Provider  alendronate (FOSAMAX) 70 MG tablet Take 1 tablet (70 mg total) by mouth once a week. Take with a full glass of water on an empty stomach. Patient taking differently: Take 70 mg by mouth once a week. Take on Friday with a full glass of water on an empty stomach. 10/08/14  Yes [name], NP  apixaban (ELIQUIS) 5 MG TABS tablet Take 1 tablet (5 mg total) by mouth 2 (two) times daily. 09/28/14  Yes Pricilla Riffle, MD  Ascorbic Acid (VITAMIN C) 500 MG tablet Take 500 mg by mouth daily.     Yes Historical Provider, MD  Calcium Carbonate-Vit D-Min 600-400 MG-UNIT TABS Take 1 tablet by mouth 2 (two) times daily.    Yes Historical Provider, MD   carvedilol (COREG) 3.125 MG tablet Take 1 tablet (3.125 mg total) by mouth 2 (two) times daily with a meal. 02/18/15  Yes Pricilla Riffle, MD  furosemide (LASIX) 40 MG tablet Take 1 tablet (40 mg total) by mouth 2 (two) times daily. 03/05/15  Yes Pricilla Riffle, MD  lisinopril (PRINIVIL,ZESTRIL) 2.5 MG tablet TAKE 1 TABLET BY MOUTH EVERY DAY Patient taking differently: TAKE 1 TABLET BY MOUTH TWICE DAILY 03/09/15  Yes Pricilla Riffle, MD  methimazole (TAPAZOLE) 10 MG tablet Take 10 mg by mouth 3 (three) times a week. Monday, Wednesday, and Friday 02/11/15  Yes Historical Provider, MD  Multiple Vitamins-Minerals (CENTRUM SILVER) tablet Take 1 tablet by mouth daily.     Yes Historical Provider, MD  nitroGLYCERIN (NITROSTAT) 0.4 MG SL tablet Place 1 tablet (0.4 mg total) under the tongue every 5 (five) minutes as needed. For chest pain 03/31/14  Yes Mahima Glade Lloyd, MD  pantoprazole (PROTONIX) 40 MG tablet Take 1 tablet (40 mg total) by mouth daily at 6 (six) AM. 09/28/14  Yes Pricilla Riffle, MD  polyethylene glycol (MIRALAX / GLYCOLAX) packet Take 17 g by mouth daily.    Yes Historical Provider, MD  potassium chloride (K-DUR) 10 MEQ tablet Take 1 tablet (10 mEq total) by mouth daily. 01/07/15  Yes Jeralyn Bennett, MD  sertraline (ZOLOFT) 50 MG tablet Take 1 tablet (50 mg total) by mouth daily. 11/18/14  Yes Marvel Plan, MD  simvastatin (ZOCOR) 40 MG tablet TAKE 1 TABLET BY MOUTH EVERY DAY AT 6PM 03/09/15  Yes Pricilla Riffle, MD  carvedilol (COREG) 6.25 MG tablet TAKE 1 TABLET BY MOUTH TWICE A DAY Patient not taking: Reported on 03/13/2015 03/09/15   Pricilla Riffle, MD  lisinopril (PRINIVIL,ZESTRIL) 5 MG tablet Take 0.5 tablets (2.5 mg total) by mouth daily. Patient not taking: Reported on 03/13/2015 01/13/15   Pricilla Riffle, MD  methimazole (TAPAZOLE) 5 MG tablet Take one tablet by mouth three days a week as prescribed by endocrinology Patient not taking: Reported on 03/13/2015 10/08/14   [name], NP   BP 149/89 mmHg   Pulse 63  Temp(Src) 97.5 F (36.4 C) (Oral)  Resp 22  Ht 5' (1.524 m)  Wt 162 lb 7.7 oz (73.7 kg)  BMI 31.73 kg/m2  SpO2 92% Physical Exam  Constitutional: She appears well-developed and well-nourished. No distress.  HENT:  Head: Atraumatic.  Mouth/Throat: Oropharynx is clear and moist. No oropharyngeal exudate.  Eyes: Conjunctivae and EOM are normal. Pupils are equal, round, and reactive to light.  Neck: Normal range of motion. Neck supple.  Cardiovascular: Normal rate, regular rhythm and normal heart sounds.   No murmur heard. Pulmonary/Chest: Effort normal.  Bibasilar crackles  Abdominal: Soft. There is no tenderness. There is no rebound and no guarding.  Musculoskeletal: Normal range of motion. She exhibits no edema or tenderness.  Neurological: She is alert. No cranial nerve deficit.  Oriented x2.  Moving all extremities, CN 2-12 intact    ED Course  Procedures (including critical care time) Labs Review Labs Reviewed  URINALYSIS, ROUTINE W REFLEX MICROSCOPIC (NOT AT [facility]) - Abnormal; Notable for the following:    APPearance CLOUDY (*)    Protein, ur 100 (*)    Leukocytes, UA SMALL (*)    All other components within normal limits  CBC WITH DIFFERENTIAL/PLATELET - Abnormal; Notable for the following:    RBC 5.24 (*)    RDW 22.2 (*)    All other components within normal limits  COMPREHENSIVE METABOLIC PANEL - Abnormal; Notable for the following:    Glucose, Bld 140 (*)    Total Protein 6.1 (*)    Albumin 3.3 (*)    GFR calc non Af Amer 59 (*)    All other components within normal limits  BRAIN NATRIURETIC PEPTIDE - Abnormal; Notable for the following:    B Natriuretic Peptide 1709.5 (*)    All other components within normal limits  TROPONIN I - Abnormal; Notable for the following:    Troponin I 0.05 (*)    All other components within normal limits  PROTIME-INR - Abnormal; Notable for the following:    Prothrombin Time 21.4 (*)    INR 1.87 (*)    All other  components within normal limits  URINE MICROSCOPIC-ADD ON - Abnormal; Notable for the following:    Bacteria, UA MANY (*)    All other components within normal limits  URINE CULTURE  COMPREHENSIVE METABOLIC PANEL  CBC    Imaging Review Dg Chest 2 View  03/13/2015   CLINICAL DATA:  Altered mental status  EXAM: CHEST  2 VIEW  COMPARISON:  12/29/2014  FINDINGS: There is cardiomegaly. Left pacer in stable position. Elevation of the right hemidiaphragm. Minimal bibasilar atelectasis. No overt edema or effusions.  IMPRESSION: Cardiomegaly. Bibasilar atelectasis with elevation of the right hemidiaphragm   Electronically Signed   By: Charlett Nose M.D.   On: 03/13/2015 10:49   Ct Head Wo Contrast  03/13/2015   CLINICAL DATA:  Altered mental status. Increased weakness for 3 days.  EXAM: CT HEAD WITHOUT CONTRAST  TECHNIQUE: Contiguous axial images were obtained from the base of the skull through the vertex without intravenous contrast.  COMPARISON:  08/25/2014  FINDINGS: There is atrophy and chronic small vessel disease changes. No acute intracranial abnormality. Specifically, no hemorrhage, hydrocephalus, mass lesion, acute infarction, or significant intracranial injury. No acute calvarial abnormality. Visualized paranasal sinuses and mastoids clear. Orbital soft tissues unremarkable.  IMPRESSION: No acute intracranial abnormality.  Atrophy, chronic microvascular disease.   Electronically Signed   By: Charlett Nose M.D.   On: 03/13/2015 10:30   I have personally reviewed and evaluated these images and lab results as part of my medical decision-making.   EKG Interpretation   Date/Time:  Saturday March 13 2015 09:36:33 EDT Ventricular Rate:  61 PR Interval:  261 QRS Duration: 111 QT Interval:  461 QTC Calculation: 464 R Axis:   -72 Text Interpretation:  Sinus or ectopic atrial rhythm Paired ventricular  premature complexes Prolonged PR interval Consider left atrial enlargement  Left anterior  fascicular block Abnormal R-wave progression, late  transition LVH with secondary repolarization abnormality Nonspecific ST  abnormality Confirmed by  RANCOUR  MD, Jeannett Senior [id number]) on 03/13/2015 9:48:47  AM      MDM   Final diagnoses:  Urinary tract infection without hematuria, site unspecified  Altered mental status, unspecified altered mental status type  Altered mental status, generalized weakness x 3 days.   Labs show stable elevation of troponin that is chronic. Renal function is at baseline. CT head is negative. Cath July 2016 without critical stenosis.  Son at bedside states patient has had worsening weakness and inability to get out of bed for the past several days. She was diaphoretic and has shortness of breath yesterday and he was worried that she was having a heart attack. She's not had any chest pain.  Workup remarkable for urinary tract infection. Son also states she's had slurred speech and is not at her baseline. She is unable to get an MRI due to her pacemaker.  Suspect AMS and slurred speech due to UTI. Admission dw Dr. [name].   Glynn Octave, MD 03/13/15 [phone]

## 2015-03-13 NOTE — ED Notes (Signed)
Pt sleeping, O2 dropping to 89%, 2L applied.  O2 99%

## 2015-03-13 NOTE — ED Notes (Signed)
Patient transported to CT 

## 2015-03-13 NOTE — H&P (Signed)
History and Physical  [name] ZOX:[id number] DOB: July 31, 1931 DOA: 03/13/2015  Referring physician: Dr. Manus Gunning (EDP) PCP: [name], NP   Chief Complaint: AMS, generalized weakness, dyspnea, slurred speach  HPI: 81 y.o. female with a significant history of systolic congestive heart failure and coronary artery disease and paroxysmal atrial fibrillation who was brought to the ED via EMS accompanied by her son due to altered mental status, severe generalized weakness, dyspnea, and slurring of speech.  The patient speaks [place] and her son translates for her though at the present time he states she is confused and he can't understand what she is saying because she is also slurring her words.  Full history is provided by the son who states that 3-4 days ago he began to notice her becoming more and more dyspneic and weak in general with no focal deficits.  Yesterday her dyspnea seemed to worsen and then today he found that she was significantly confused and also slurring her words.  She is normally able to ambulate with assistance using a walker but at this time is not been him to get up out of bed.  She has not complained of chest pain nausea vomiting or abdominal pain.  She did have a self-limited episode of diarrhea yesterday.  In the ED the patient was found to have evidence of UTI.  Clinical exam suggests mild volume overload.  She is alert but does remain altered.  CT scan of the head reveals no acute findings and MRI cannot be accomplished as the patient has a pacemaker.  Assessment/Plan  UTI Send urine for culture - empiric coverage with Rocephin  Altered mental status - toxic metabolic encephalopathy Possibly related to UTI - must consider the possibility of a stroke as well - there are no further test acutely that could be done to definitively rule out a CVA - family understands and agrees the patient would not be a candidate for acute aggressive intervention even if she had  suffered a CVA - will rule out metabolic causes, treat her UTI, and follow her mental status - if her slurring of speech and confusion did not improve we will consider repeating a CT scan in 48 hours   Acute exacerbation of Chronic systolic congestive heart failure EF 25% via echo July 2016 - baseline weight appears to be approximately 76 kg with patient presenting to the hospital at approximately 80 kg - gently diurese - follow daily weights and I's/O's - consider reevaluation of LV function  Coronary artery disease status post RCA stent  Last cath July 2016 w/o critical stenosis - No evidence acutely to suggest acute coronary syndrome  Parox Afib  On chronic Eliquis - Rate presently well controlled  Hx of CVA   PSVT  Status post pacemaker placement 2011 for Tachy-brady  Hypertension Blood pressure modestly elevated  - follow with attempts to improve diuresis  Hyperthyroidism  Continue home Dose  Hyperlipidemia Continue home medical therapy  Code Status: FULL DVT Prophylaxis: Eliquis  Family Communication: Spoke with son at bedside at length  Disposition Plan: Admit to telemetry bed - treat UTI - diurese - follow mental status for improvement  Past Medical History  Diagnosis Date  . Lumbar back pain   . CAD (coronary artery disease)     a. s/p prior RCA stenting;  b. 12/2014 Cath: LM nl, LAD 25p, LCX 20p/m, RCA 25ost, 20d ISR, EF 25-30%.  . Memory loss   . DJD (degenerative joint disease)   .  Depression   . Essential hypertension   . CVA (cerebral infarction)   . Chronic systolic CHF (congestive heart failure)     a. 12/2014 Echo: EF 25%, mild LVH, mod MR, sev dil LA, mildly dil RA, PASP .  Marland Kitchen NICM (nonischemic cardiomyopathy)     a. 07/2014 EF 50%;  b. 12/2014 Echo: EF 25%.  . Presence of permanent cardiac pacemaker     a. 10/2009 s/p MDT [id number] Adapta DC PPM, Ser # [id number] H.  . Osteoporosis   . Hypercholesterolemia   . Hyperthyroidism   . Anxiety   . Vertigo     . Tachy-brady syndrome     a. 10/2009 s/p MDT PPM.  . TIA (transient ischemic attack)   . PSVT (paroxysmal supraventricular tachycardia)     a. 12/2009 while in hospital.   Past Surgical History  Procedure Laterality Date  . Pacemaker insertion  2011  . Total knee arthroplasty  2002    bilateral  . Abdominal hysterectomy  1986  . Bilateral stenting  1999    to the RCA  . Cardiac catheterization  05/23/10  . Total abdominal hysterectomy w/ bilateral salpingoophorectomy    . Cardiac catheterization N/A 01/06/2015    Procedure: Left Heart Cath and Coronary Angiography;  Surgeon: Lennette Bihari, MD;  Location: [facility] INVASIVE CV LAB;  Service: Cardiovascular;  Laterality: N/A;    Allergies  Allergen Reactions  . Namenda [Memantine Hcl] Other (See Comments)    Dizzy  . Iodine Other (See Comments)    unknown  . Iohexol Hives     Code: HIVES, Desc: hives with nonionic contrast 7 yrs ago ([place] iv benedryl given   . Peanut-Containing Drug Products Other (See Comments)    unknown    Social Hx:  reports that she has never smoked. She has never used smokeless tobacco. She reports that she does not drink alcohol or use illicit drugs.  She presently lives in a mother-in-law suite at her son's home and has assistance for 3-4 hours a day and also is alone 4-6 hours a day.  Family Hx:   Family History  Problem Relation Age of Onset  . Thyroid disease Daughter   . Diabetes Other   . Hypertension Other   . Arthritis Other   . Coronary artery disease Mother 72  . Coronary artery disease Father 14  . Heart attack Father   . Heart attack Sister   . Heart attack Brother   . Hypertension Sister   . Hypertension Brother   . Stroke Brother   . Stroke Sister      Review of Systems:  Constitutional:  No weight loss, night sweats, Fevers, chills.  HEENT:  No headaches, Difficulty swallowing,Tooth/dental problems,Sore throat,  No sneezing, itching, ear ache, nasal congestion, post nasal drip,   Cardio-vascular:  No chest pain, anasarca, dizziness, palpitations  GI:  No heartburn, indigestion, abdominal pain, nausea, vomiting, diarrhea, change in bowel habits, loss of appetite  Resp:  No excess mucus, no productive cough, No non-productive cough, No coughing up of blood.No change in color of mucus.No wheezing.No chest wall deformity  Skin:  no rash or lesions.  GU:  no dysuria, change in color of urine, no urgency or frequency. No flank pain.  Musculoskeletal:  No joint pain or swelling. No decreased range of motion. No back pain.  Psych:  Positive as noted above with altered mental status.   Prior to Admission medications   Medication Sig Start Date End Date Taking? Authorizing  Provider  alendronate (FOSAMAX) 70 MG tablet Take 1 tablet (70 mg total) by mouth once a week. Take with a full glass of water on an empty stomach. Patient taking differently: Take 70 mg by mouth once a week. Take on Friday with a full glass of water on an empty stomach. 10/08/14  Yes [name], NP  apixaban (ELIQUIS) 5 MG TABS tablet Take 1 tablet (5 mg total) by mouth 2 (two) times daily. 09/28/14  Yes Pricilla Riffle, MD  Ascorbic Acid (VITAMIN C) 500 MG tablet Take 500 mg by mouth daily.     Yes Historical Provider, MD  Calcium Carbonate-Vit D-Min 600-400 MG-UNIT TABS Take 1 tablet by mouth 2 (two) times daily.    Yes Historical Provider, MD  carvedilol (COREG) 3.125 MG tablet Take 1 tablet (3.125 mg total) by mouth 2 (two) times daily with a meal. 02/18/15  Yes Pricilla Riffle, MD  furosemide (LASIX) 40 MG tablet Take 1 tablet (40 mg total) by mouth 2 (two) times daily. 03/05/15  Yes Pricilla Riffle, MD  lisinopril (PRINIVIL,ZESTRIL) 2.5 MG tablet TAKE 1 TABLET BY MOUTH EVERY DAY Patient taking differently: TAKE 1 TABLET BY MOUTH TWICE DAILY 03/09/15  Yes Pricilla Riffle, MD  methimazole (TAPAZOLE) 10 MG tablet Take 10 mg by mouth 3 (three) times a week. Monday, Wednesday, and Friday 02/11/15  Yes Historical  Provider, MD  Multiple Vitamins-Minerals (CENTRUM SILVER) tablet Take 1 tablet by mouth daily.     Yes Historical Provider, MD  nitroGLYCERIN (NITROSTAT) 0.4 MG SL tablet Place 1 tablet (0.4 mg total) under the tongue every 5 (five) minutes as needed. For chest pain 03/31/14  Yes Mahima Glade Lloyd, MD  pantoprazole (PROTONIX) 40 MG tablet Take 1 tablet (40 mg total) by mouth daily at 6 (six) AM. 09/28/14  Yes Pricilla Riffle, MD  polyethylene glycol (MIRALAX / GLYCOLAX) packet Take 17 g by mouth daily.    Yes Historical Provider, MD  potassium chloride (K-DUR) 10 MEQ tablet Take 1 tablet (10 mEq total) by mouth daily. 01/07/15  Yes Jeralyn Bennett, MD  sertraline (ZOLOFT) 50 MG tablet Take 1 tablet (50 mg total) by mouth daily. 11/18/14  Yes Marvel Plan, MD  simvastatin (ZOCOR) 40 MG tablet TAKE 1 TABLET BY MOUTH EVERY DAY AT 6PM 03/09/15  Yes Pricilla Riffle, MD  carvedilol (COREG) 6.25 MG tablet TAKE 1 TABLET BY MOUTH TWICE A DAY Patient not taking: Reported on 03/13/2015 03/09/15   Pricilla Riffle, MD  lisinopril (PRINIVIL,ZESTRIL) 5 MG tablet Take 0.5 tablets (2.5 mg total) by mouth daily. Patient not taking: Reported on 03/13/2015 01/13/15   Pricilla Riffle, MD  methimazole (TAPAZOLE) 5 MG tablet Take one tablet by mouth three days a week as prescribed by endocrinology Patient not taking: Reported on 03/13/2015 10/08/14   [name], NP    Physical Exam: Filed Vitals:   03/13/15 1201 03/13/15 1215 03/13/15 1230 03/13/15 1245  BP:  149/87 148/84 147/89  Pulse:  59 60 63  Temp:      TempSrc:      Resp:   25   Weight: 79.601 kg (175 lb 7.8 oz)     SpO2:  97% 97% 96%    Wt Readings from Last 3 Encounters:  03/13/15 79.601 kg (175 lb 7.8 oz)  01/21/15 76.204 kg (168 lb)  01/07/15 76.794 kg (169 lb 4.8 oz)    General: No acute respiratory distress - alert but confused  HEENT: Normocephalic,atraumatic,  pupils equal round reactive to light and accommodation, extraocular muscles intact bilaterally, OC/OP  clear, sclera nonicteric Neck: JVD to angle of jaw  Lungs: Clear to auscultation bilaterally without wheezes or rhonchi, with good air movement throughout all fields w/ exception to modest crackles in B bases  Cardiovascular: Regular rate and rhythm without murmur gallop or rub  Abdomen: Nontender, slightly overweight, soft, bowel sounds present, no organomegaly, no rebound, no ascites Extremities: No significant cyanosis, or clubbing; 1+ edema bilateral lower extremities Neurologic: Alert but not oriented, speech reportedly slurred per son (pt speaks only [place]),4 over 5 strength bilateral upper and lower extremities w/o focal weakness - no Babinski   Labs on Admission:  Basic Metabolic Panel:  Recent Labs Lab 03/13/15 1000  NA 143  K 3.8  CL 101  CO2 31  GLUCOSE 140*  BUN 16  CREATININE 0.90  CALCIUM 9.1   Liver Function Tests:  Recent Labs Lab 03/13/15 1000  AST 32  ALT 22  ALKPHOS 66  BILITOT 0.9  PROT 6.1*  ALBUMIN 3.3*   CBC:  Recent Labs Lab 03/13/15 1000  WBC 7.0  NEUTROABS 4.6  HGB 14.3  HCT 45.6  MCV 87.0  PLT 234   Cardiac Enzymes:  Recent Labs Lab 03/13/15 1000  TROPONINI 0.05*    BNP (last 3 results)  Recent Labs  12/29/14 2249 01/03/15 0353 03/13/15 1000  BNP 716.0* 1027.9* 1709.5*   Radiological Exams on Admission: Dg Chest 2 View  03/13/2015   CLINICAL DATA:  Altered mental status  EXAM: CHEST  2 VIEW  COMPARISON:  12/29/2014  FINDINGS: There is cardiomegaly. Left pacer in stable position. Elevation of the right hemidiaphragm. Minimal bibasilar atelectasis. No overt edema or effusions.  IMPRESSION: Cardiomegaly. Bibasilar atelectasis with elevation of the right hemidiaphragm   Electronically Signed   By: Charlett Nose M.D.   On: 03/13/2015 10:49   Ct Head Wo Contrast  03/13/2015   CLINICAL DATA:  Altered mental status. Increased weakness for 3 days.  EXAM: CT HEAD WITHOUT CONTRAST  TECHNIQUE: Contiguous axial images were obtained  from the base of the skull through the vertex without intravenous contrast.  COMPARISON:  08/25/2014  FINDINGS: There is atrophy and chronic small vessel disease changes. No acute intracranial abnormality. Specifically, no hemorrhage, hydrocephalus, mass lesion, acute infarction, or significant intracranial injury. No acute calvarial abnormality. Visualized paranasal sinuses and mastoids clear. Orbital soft tissues unremarkable.  IMPRESSION: No acute intracranial abnormality.  Atrophy, chronic microvascular disease.   Electronically Signed   By: Charlett Nose M.D.   On: 03/13/2015 10:30    EKG: noted per this MD - PVCs noted - no acute ST elevation   Time spent: 70+ mins  [name], MD [facility] For Consults/Admissions - Flow Manager - [phone] Office  [phone] Pager [phone]  On-Call/Text Page:      [email or web address]      password [facility]

## 2015-03-14 DIAGNOSIS — R531 Weakness: Secondary | ICD-10-CM

## 2015-03-14 DIAGNOSIS — I5021 Acute systolic (congestive) heart failure: Secondary | ICD-10-CM

## 2015-03-14 LAB — CBC
HEMATOCRIT: 46.7 % — AB (ref 36.0–46.0)
Hemoglobin: 14.6 g/dL (ref 12.0–15.0)
MCH: 27.7 pg (ref 26.0–34.0)
MCHC: 31.3 g/dL (ref 30.0–36.0)
MCV: 88.6 fL (ref 78.0–100.0)
Platelets: 233 10*3/uL (ref 150–400)
RBC: 5.27 MIL/uL — AB (ref 3.87–5.11)
RDW: 22.2 % — ABNORMAL HIGH (ref 11.5–15.5)
WBC: 9.9 10*3/uL (ref 4.0–10.5)

## 2015-03-14 LAB — COMPREHENSIVE METABOLIC PANEL
ALT: 24 U/L (ref 14–54)
AST: 28 U/L (ref 15–41)
Albumin: 3.2 g/dL — ABNORMAL LOW (ref 3.5–5.0)
Alkaline Phosphatase: 66 U/L (ref 38–126)
Anion gap: 9 (ref 5–15)
BUN: 19 mg/dL (ref 6–20)
CHLORIDE: 97 mmol/L — AB (ref 101–111)
CO2: 35 mmol/L — ABNORMAL HIGH (ref 22–32)
Calcium: 8.7 mg/dL — ABNORMAL LOW (ref 8.9–10.3)
Creatinine, Ser: 1.05 mg/dL — ABNORMAL HIGH (ref 0.44–1.00)
GFR, EST AFRICAN AMERICAN: 57 mL/min — AB (ref >60)
GFR, EST NON AFRICAN AMERICAN: 49 mL/min — AB (ref >60)
Glucose, Bld: 138 mg/dL — ABNORMAL HIGH (ref 65–99)
POTASSIUM: 3.2 mmol/L — AB (ref 3.5–5.1)
Sodium: 141 mmol/L (ref 135–145)
Total Bilirubin: 0.7 mg/dL (ref 0.3–1.2)
Total Protein: 6.5 g/dL (ref 6.5–8.1)

## 2015-03-14 MED ORDER — POTASSIUM CHLORIDE CRYS ER 20 MEQ PO TBCR
40.0000 meq | EXTENDED_RELEASE_TABLET | Freq: Once | ORAL | Status: AC
  Administered 2015-03-14: 40 meq via ORAL
  Filled 2015-03-14: qty 2

## 2015-03-14 NOTE — Progress Notes (Signed)
[facility]                                                                              Patient Demographics  [name], is a 81 y.o. female, DOB - May 07, 1932, ZOX:[id number]  Admit date - 03/13/2015   Admitting Physician [name], MD  Outpatient Primary MD for the patient is [name], NP  LOS - 1   Chief Complaint  Patient presents with  . Weakness      HPI on 03/13/2015 by Dr. Jetty Duhamel 81 y.o. female with a significant history of systolic congestive heart failure and coronary artery disease and paroxysmal atrial fibrillation who was brought to the ED via EMS accompanied by her son due to altered mental status, severe generalized weakness, dyspnea, and slurring of speech. The patient speaks [place] and her son translates for her though at the present time he states she is confused and he can't understand what she is saying because she is also slurring her words. Full history is provided by the son who states that 3-4 days ago he began to notice her becoming more and more dyspneic and weak in general with no focal deficits. Yesterday her dyspnea seemed to worsen and then today he found that she was significantly confused and also slurring her words. She is normally able to ambulate with assistance using a walker but at this time is not been him to get up out of bed. She has not complained of chest pain nausea vomiting or abdominal pain. She did have a self-limited episode of diarrhea yesterday.  In the ED the patient was found to have evidence of UTI. Clinical exam suggests mild volume overload. She is alert but does remain altered. CT scan of the head reveals no acute findings and MRI cannot be accomplished as the patient has a pacemaker.  Assessment & Plan   Acute systolic CHF exacerbation -BNP 1709 -Per son, patient's weight is normally mid 160s -Last echocardiogram July 2016: EF 25%  -Will obtain limited echo as patient's EF has steadily  dropped from Feb 2016 to July 2016 -Continue to monitor Daily weights, intake and output -Continue Lasix  Acute metabolic encephalopathy -Possibly related to UTI -Appears to be improving -CT head: no acute intracranial abnormality -Cannot obtain MRI due to pacemaker -Possibly consider repeat CT 03/15/2015  UTI -Pending urine culture -Continue ceftriaxone  Coronary artery disease status post RCA stent  -Last cath July 2016 w/o critical stenosis  -No evidence acutely to suggest acute coronary syndrome  Parox Afib  -On chronic Eliquis - Rate presently well controlled  Hx of CVA  -CT head negative -Continue Eliquis  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 Pending   Time Spent in minutes   30 minutes  Procedures  None  Consults   None  DVT Prophylaxis  Eliquis  Lab Results  Component Value Date   PLT 233 03/14/2015    Medications  Scheduled Meds: . apixaban  5 mg Oral BID  . carvedilol  3.125 mg Oral BID WC  . cefTRIAXone (ROCEPHIN)  IV  1 g Intravenous Q24H  . furosemide  60 mg Intravenous Q12H  . lisinopril  2.5 mg Oral Daily  . [START ON 03/15/2015] methimazole  10 mg Oral Once per day on Mon Wed Fri  . pantoprazole  40 mg Oral Q0600  . polyethylene glycol  17 g Oral Daily  . simvastatin  40 mg Oral q1800   Continuous Infusions:  PRN Meds:.acetaminophen **OR** acetaminophen, ondansetron **OR** ondansetron (ZOFRAN) IV  Antibiotics    Anti-infectives    Start     Dose/Rate Route Frequency Ordered Stop   03/14/15 1200  cefTRIAXone (ROCEPHIN) 1 g in dextrose 5 % 50 mL IVPB     1 g 100 mL/hr over 30 Minutes Intravenous Every 24 hours 03/13/15 1342     03/13/15 1215  cefTRIAXone (ROCEPHIN) 1 g in dextrose 5 % 50 mL  IVPB     1 g 100 mL/hr over 30 Minutes Intravenous  Once 03/13/15 1214 03/13/15 1306        Subjective:   [facility] seen and examined today.  Patient does speak some [place].  Currently complains of lower abdominal pain and feels constipated.  Denies chest pain, shortness of breath, headache, dizziness, nausea, vomiting.    Objective:   Filed Vitals:   03/13/15 2040 03/14/15 0041 03/14/15 0429 03/14/15 0938  BP: 103/67 108/62 135/82 104/56  Pulse: 66 64 64 67  Temp: 97.7 F (36.5 C) 97.5 F (36.4 C) 98 F (36.7 C)   TempSrc: Oral Oral Oral   Resp: Height:      Weight:   72.3 kg (159 lb 6.3 oz)   SpO2: 95% 93% 95% 96%    Wt Readings from Last 3 Encounters:  03/14/15 72.3 kg (159 lb 6.3 oz)  01/21/15 76.204 kg (168 lb)  01/07/15 76.794 kg (169 lb 4.8 oz)     Intake/Output Summary (Last 24 hours) at 03/14/15 1151 Last data filed at 03/14/15 0600  Gross per 24 hour  Intake    180 ml  Output      0 ml  Net    180 ml    Exam  General: Well developed, well nourished, NAD  HEENT: NCAT, mucous membranes moist.   Neck: Supple, + JVD, no masses  Cardiovascular: S1 S2 auscultated, RRR, no murmurs   Respiratory: Clear to auscultation bilaterally  Abdomen: Soft, nontender, nondistended, + bowel sounds  Extremities: warm dry without cyanosis clubbing. +1 LE edema  Neuro: AAOx3, nonfocal  Data Review   Micro Results No results found for this or any previous visit (from the past 240 hour(s)).  Radiology Reports Dg Chest 2 View  03/13/2015   CLINICAL DATA:  Altered mental status  EXAM: CHEST  2 VIEW  COMPARISON:  12/29/2014  FINDINGS: There is cardiomegaly. Left pacer in stable position. Elevation of the right hemidiaphragm. Minimal bibasilar atelectasis. No overt edema or effusions.  IMPRESSION: Cardiomegaly. Bibasilar atelectasis with elevation of the right hemidiaphragm   Electronically Signed   By: Charlett Nose M.D.   On: 03/13/2015 10:49   Ct  Head Wo Contrast  03/13/2015   CLINICAL DATA:  Altered mental status. Increased weakness for 3 days.  EXAM: CT HEAD WITHOUT CONTRAST  TECHNIQUE: Contiguous axial images were obtained from the base of the skull through the vertex without intravenous contrast.  COMPARISON:  08/25/2014  FINDINGS: There is atrophy and chronic small vessel disease changes. No  acute intracranial abnormality. Specifically, no hemorrhage, hydrocephalus, mass lesion, acute infarction, or significant intracranial injury. No acute calvarial abnormality. Visualized paranasal sinuses and mastoids clear. Orbital soft tissues unremarkable.  IMPRESSION: No acute intracranial abnormality.  Atrophy, chronic microvascular disease.   Electronically Signed   By: Charlett Nose M.D.   On: 03/13/2015 10:30    CBC  Recent Labs Lab 03/13/15 1000 03/14/15 0252  WBC 7.0 9.9  HGB 14.3 14.6  HCT 45.6 46.7*  PLT 234 233  MCV 87.0 88.6  MCH 27.3 27.7  MCHC 31.4 31.3  RDW 22.2* 22.2*  LYMPHSABS 1.8  --   MONOABS 0.4  --   EOSABS 0.1  --   BASOSABS 0.1  --     Chemistries   Recent Labs Lab 03/13/15 1000 03/14/15 0252  NA 143 141  K 3.8 3.2*  CL 101 97*  CO2 31 35*  GLUCOSE 140* 138*  BUN 16 19  CREATININE 0.90 1.05*  CALCIUM 9.1 8.7*  AST 32 28  ALT 22 24  ALKPHOS 66 66  BILITOT 0.9 0.7   ------------------------------------------------------------------------------------------------------------------ estimated creatinine clearance is 37.9 mL/min (by C-G formula based on Cr of 1.05). ------------------------------------------------------------------------------------------------------------------ No results for input(s): HGBA1C in the last 72 hours. ------------------------------------------------------------------------------------------------------------------ No results for input(s): CHOL, HDL, LDLCALC, TRIG, CHOLHDL, LDLDIRECT in the last 72  hours. ------------------------------------------------------------------------------------------------------------------ No results for input(s): TSH, T4TOTAL, T3FREE, THYROIDAB in the last 72 hours.  Invalid input(s): FREET3 ------------------------------------------------------------------------------------------------------------------ No results for input(s): VITAMINB12, FOLATE, FERRITIN, TIBC, IRON, RETICCTPCT in the last 72 hours.  Coagulation profile  Recent Labs Lab 03/13/15 1000  INR 1.87*    No results for input(s): DDIMER in the last 72 hours.  Cardiac Enzymes  Recent Labs Lab 03/13/15 1000  TROPONINI 0.05*   ------------------------------------------------------------------------------------------------------------------ Invalid input(s): POCBNP    Sable Knoles D.O. on 03/14/2015 at 11:51 AM  Between 7am to 7pm - Pager - [phone]  After 7pm go to [email or web address] - password [email or web address]  And look for the night coverage person covering for me after hours  [facility] Office  [phone]

## 2015-03-15 ENCOUNTER — Inpatient Hospital Stay (HOSPITAL_COMMUNITY): Payer: Medicare Other

## 2015-03-15 ENCOUNTER — Other Ambulatory Visit (HOSPITAL_COMMUNITY): Payer: Medicare Other

## 2015-03-15 DIAGNOSIS — I509 Heart failure, unspecified: Secondary | ICD-10-CM

## 2015-03-15 DIAGNOSIS — R471 Dysarthria and anarthria: Secondary | ICD-10-CM

## 2015-03-15 LAB — CBC
HCT: 47.2 % — ABNORMAL HIGH (ref 36.0–46.0)
Hemoglobin: 14.6 g/dL (ref 12.0–15.0)
MCH: 27.7 pg (ref 26.0–34.0)
MCHC: 30.9 g/dL (ref 30.0–36.0)
MCV: 89.4 fL (ref 78.0–100.0)
PLATELETS: 243 10*3/uL (ref 150–400)
RBC: 5.28 MIL/uL — AB (ref 3.87–5.11)
RDW: 22.2 % — ABNORMAL HIGH (ref 11.5–15.5)
WBC: 8.5 10*3/uL (ref 4.0–10.5)

## 2015-03-15 LAB — BASIC METABOLIC PANEL
ANION GAP: 7 (ref 5–15)
BUN: 15 mg/dL (ref 6–20)
CO2: 36 mmol/L — ABNORMAL HIGH (ref 22–32)
Calcium: 8.6 mg/dL — ABNORMAL LOW (ref 8.9–10.3)
Chloride: 101 mmol/L (ref 101–111)
Creatinine, Ser: 0.96 mg/dL (ref 0.44–1.00)
GFR, EST NON AFRICAN AMERICAN: 54 mL/min — AB (ref >60)
Glucose, Bld: 119 mg/dL — ABNORMAL HIGH (ref 65–99)
Potassium: 3.4 mmol/L — ABNORMAL LOW (ref 3.5–5.1)
SODIUM: 144 mmol/L (ref 135–145)

## 2015-03-15 MED ORDER — POTASSIUM CHLORIDE CRYS ER 20 MEQ PO TBCR
40.0000 meq | EXTENDED_RELEASE_TABLET | Freq: Once | ORAL | Status: AC
  Administered 2015-03-15: 40 meq via ORAL
  Filled 2015-03-15: qty 2

## 2015-03-15 NOTE — Progress Notes (Signed)
[facility]                                                                              Patient Demographics  [name], is a 81 y.o. female, DOB - [birth year], YYQ:[id number]  Admit date - 03/13/2015   Admitting Physician [name], MD  Outpatient Primary MD for the patient is [name], NP  LOS - 2   Chief Complaint  Patient presents with  . Weakness      HPI on 03/13/2015 by Dr. Jetty Duhamel 81 y.o. female with a significant history of systolic congestive heart failure and coronary artery disease and paroxysmal atrial fibrillation who was brought to the ED via EMS accompanied by her son due to altered mental status, severe generalized weakness, dyspnea, and slurring of speech. The patient speaks [name] and her son translates for her though at the present time he states she is confused and he can't understand what she is saying because she is also slurring her words. Full history is provided by the son who states that 3-4 days ago he began to notice her becoming more and more dyspneic and weak in general with no focal deficits. Yesterday her dyspnea seemed to worsen and then today he found that she was significantly confused and also slurring her words. She is normally able to ambulate with assistance using a walker but at this time is not been him to get up out of bed. She has not complained of chest pain nausea vomiting or abdominal pain. She did have a self-limited episode of diarrhea yesterday.  In the ED the patient was found to have evidence of UTI. Clinical exam suggests mild volume overload. She is alert but does remain altered. CT scan of the head reveals no acute findings and MRI cannot be accomplished as the patient has a pacemaker.  Assessment & Plan   Acute systolic CHF exacerbation -BNP 1709 -Per son, patient's weight is normally mid 160s -Last echocardiogram July 2016: EF 25%  -Pending echo (patient's EF has steadily dropped from  Feb 2016 to July 2016 ) -Continue to monitor Daily weights, intake and output -Continue Lasix -Will consult cardio pending results of echocardiogram  Acute metabolic encephalopathy -Possibly related to UTI -Appears to be improving -CT head: no acute intracranial abnormality -Cannot obtain MRI due to pacemaker -Possibly consider repeat CT 03/15/2015 -Of note, patient's son states that she has "some memory loss and forgetfulness"  UTI -Pending urine culture -Continue ceftriaxone  Coronary artery disease status post RCA stent  -Last cath July 2016 w/o critical stenosis  -No evidence acutely to suggest acute coronary syndrome  Parox Afib  -On chronic Eliquis - Rate presently well controlled  Hx of CVA  -CT head negative -Continue Eliquis  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 Pending echocardiogram  Time Spent in minutes   30 minutes  Procedures  Echocardiogram  Consults   None  DVT Prophylaxis  Eliquis  Lab Results  Component Value Date   PLT 243  03/15/2015    Medications  Scheduled Meds: . apixaban  5 mg Oral BID  . carvedilol  3.125 mg Oral BID WC  . cefTRIAXone (ROCEPHIN)  IV  1 g Intravenous Q24H  . furosemide  60 mg Intravenous Q12H  . lisinopril  2.5 mg Oral Daily  . methimazole  10 mg Oral Once per day on Mon Wed Fri  . pantoprazole  40 mg Oral Q0600  . polyethylene glycol  17 g Oral Daily  . simvastatin  40 mg Oral q1800   Continuous Infusions:  PRN Meds:.acetaminophen **OR** acetaminophen, ondansetron **OR** ondansetron (ZOFRAN) IV  Antibiotics    Anti-infectives    Start     Dose/Rate Route Frequency Ordered Stop   03/14/15 1200  cefTRIAXone (ROCEPHIN) 1 g in dextrose 5 % 50 mL IVPB     1 g 100  mL/hr over 30 Minutes Intravenous Every 24 hours 03/13/15 1342     03/13/15 1215  cefTRIAXone (ROCEPHIN) 1 g in dextrose 5 % 50 mL IVPB     1 g 100 mL/hr over 30 Minutes Intravenous  Once 03/13/15 1214 03/13/15 1306      Subjective:   [facility] seen and examined today.  Patient does speak some [place].  Denies constipation, abdominal pain, shortness of breath, chest pain.  States she had to urinate.     Objective:   Filed Vitals:   03/14/15 2002 03/15/15 0023 03/15/15 0511 03/15/15 1206  BP: 108/61 110/74 108/69 106/59  Pulse: 65 68 64 71  Temp: 98.1 F (36.7 C) 98.3 F (36.8 C) 98.6 F (37 C) 98.5 F (36.9 C)  TempSrc: Oral Oral Oral Oral  Resp: Height:      Weight:   72.6 kg (160 lb 0.9 oz)   SpO2: 96% 98% 99% 97%    Wt Readings from Last 3 Encounters:  03/15/15 72.6 kg (160 lb 0.9 oz)  01/21/15 76.204 kg (168 lb)  01/07/15 76.794 kg (169 lb 4.8 oz)     Intake/Output Summary (Last 24 hours) at 03/15/15 1225 Last data filed at 03/15/15 0907  Gross per 24 hour  Intake    918 ml  Output      0 ml  Net    918 ml    Exam  General: Well developed, well nourished, NAD  HEENT: NCAT, mucous membranes moist.   Cardiovascular: S1 S2 auscultated, RRR, no murmurs   Respiratory: Clear to auscultation bilaterally  Abdomen: Soft, nontender, nondistended, + bowel sounds  Extremities: warm dry without cyanosis clubbing. +1 LE edema  Neuro: AAOx3, nonfocal  Data Review   Micro Results Recent Results (from the past 240 hour(s))  Urine culture     Status: None (Preliminary result)   Collection Time: 03/13/15 11:42 AM  Result Value Ref Range Status   Specimen Description URINE, RANDOM  Final   Special Requests NONE  Final   Culture CULTURE REINCUBATED FOR BETTER GROWTH  Final   Report Status PENDING  Incomplete    Radiology Reports Dg Chest 2 View  03/13/2015   CLINICAL DATA:  Altered mental status  EXAM: CHEST  2 VIEW  COMPARISON:  12/29/2014   FINDINGS: There is cardiomegaly. Left pacer in stable position. Elevation of the right hemidiaphragm. Minimal bibasilar atelectasis. No overt edema or effusions.  IMPRESSION: Cardiomegaly. Bibasilar atelectasis with elevation of the right hemidiaphragm   Electronically Signed   By: Charlett Nose M.D.   On: 03/13/2015 10:49   Ct Head Wo  Contrast  03/13/2015   CLINICAL DATA:  Altered mental status. Increased weakness for 3 days.  EXAM: CT HEAD WITHOUT CONTRAST  TECHNIQUE: Contiguous axial images were obtained from the base of the skull through the vertex without intravenous contrast.  COMPARISON:  08/25/2014  FINDINGS: There is atrophy and chronic small vessel disease changes. No acute intracranial abnormality. Specifically, no hemorrhage, hydrocephalus, mass lesion, acute infarction, or significant intracranial injury. No acute calvarial abnormality. Visualized paranasal sinuses and mastoids clear. Orbital soft tissues unremarkable.  IMPRESSION: No acute intracranial abnormality.  Atrophy, chronic microvascular disease.   Electronically Signed   By: Charlett Nose M.D.   On: 03/13/2015 10:30    CBC  Recent Labs Lab 03/13/15 1000 03/14/15 0252 03/15/15 0320  WBC 7.0 9.9 8.5  HGB 14.3 14.6 14.6  HCT 45.6 46.7* 47.2*  PLT 234 233 243  MCV 87.0 88.6 89.4  MCH 27.3 27.7 27.7  MCHC 31.4 31.3 30.9  RDW 22.2* 22.2* 22.2*  LYMPHSABS 1.8  --   --   MONOABS 0.4  --   --   EOSABS 0.1  --   --   BASOSABS 0.1  --   --     Chemistries   Recent Labs Lab 03/13/15 1000 03/14/15 0252 03/15/15 0320  NA 143 141 144  K 3.8 3.2* 3.4*  CL 101 97* 101  CO2 31 35* 36*  GLUCOSE 140* 138* 119*  BUN CREATININE 0.90 1.05* 0.96  CALCIUM 9.1 8.7* 8.6*  AST 32 28  --   ALT 22 24  --   ALKPHOS 66 66  --   BILITOT 0.9 0.7  --    ------------------------------------------------------------------------------------------------------------------ estimated creatinine clearance is 41.5 mL/min (by C-G  formula based on Cr of 0.96). ------------------------------------------------------------------------------------------------------------------ No results for input(s): HGBA1C in the last 72 hours. ------------------------------------------------------------------------------------------------------------------ No results for input(s): CHOL, HDL, LDLCALC, TRIG, CHOLHDL, LDLDIRECT in the last 72 hours. ------------------------------------------------------------------------------------------------------------------ No results for input(s): TSH, T4TOTAL, T3FREE, THYROIDAB in the last 72 hours.  Invalid input(s): FREET3 ------------------------------------------------------------------------------------------------------------------ No results for input(s): VITAMINB12, FOLATE, FERRITIN, TIBC, IRON, RETICCTPCT in the last 72 hours.  Coagulation profile  Recent Labs Lab 03/13/15 1000  INR 1.87*    No results for input(s): DDIMER in the last 72 hours.  Cardiac Enzymes  Recent Labs Lab 03/13/15 1000  TROPONINI 0.05*   ------------------------------------------------------------------------------------------------------------------ Invalid input(s): POCBNP    MIKHAIL, MARYANN D.O. on 03/15/2015 at 12:25 PM  Between 7am to 7pm - Pager - [phone]  After 7pm go to [email or web address] - password [email or web address]  And look for the night coverage person covering for me after hours  [facility] Office  [phone]

## 2015-03-15 NOTE — Discharge Instructions (Signed)

## 2015-03-15 NOTE — Progress Notes (Signed)
  Echocardiogram 2D Echocardiogram has been performed.  Cathie Beams 03/15/2015, 11:52 AM

## 2015-03-16 ENCOUNTER — Inpatient Hospital Stay (HOSPITAL_COMMUNITY): Payer: Medicare Other

## 2015-03-16 LAB — BASIC METABOLIC PANEL
Anion gap: 11 (ref 5–15)
BUN: 21 mg/dL — AB (ref 6–20)
CHLORIDE: 96 mmol/L — AB (ref 101–111)
CO2: 35 mmol/L — ABNORMAL HIGH (ref 22–32)
Calcium: 8.6 mg/dL — ABNORMAL LOW (ref 8.9–10.3)
Creatinine, Ser: 0.93 mg/dL (ref 0.44–1.00)
GFR calc Af Amer: >60 mL/min (ref >60)
GFR, EST NON AFRICAN AMERICAN: 57 mL/min — AB (ref >60)
Glucose, Bld: 116 mg/dL — ABNORMAL HIGH (ref 65–99)
POTASSIUM: 4.6 mmol/L (ref 3.5–5.1)
Sodium: 142 mmol/L (ref 135–145)

## 2015-03-16 LAB — URINE CULTURE

## 2015-03-16 NOTE — Progress Notes (Signed)
[facility]                                                                              Patient Demographics  [name], is a 81 y.o. female, DOB - [birth year], [id number]  Admit date - 03/13/2015   Admitting Physician Shannon Blood, MD  Outpatient Primary MD for the patient is Shannon Seller, NP  LOS - 3   Chief Complaint  Patient presents with  . Weakness      HPI on 03/13/2015 by Dr. Jetty Holt 81 y.o. female with a significant history of systolic congestive heart failure and coronary artery disease and paroxysmal atrial fibrillation who was brought to the ED via EMS accompanied by her son due to altered mental status, severe generalized weakness, dyspnea, and slurring of speech. The patient speaks [place] and her son translates for her though at the present time he states she is confused and he can't understand what she is saying because she is also slurring her words. Full history is provided by the son who states that 3-4 days ago he began to notice her becoming more and more dyspneic and weak in general with no focal deficits. Yesterday her dyspnea seemed to worsen and then today he found that she was significantly confused and also slurring her words. She is normally able to ambulate with assistance using a walker but at this time is not been him to get up out of bed. She has not complained of chest pain nausea vomiting or abdominal pain. She did have a self-limited episode of diarrhea yesterday.  In the ED the patient was found to have evidence of UTI. Clinical exam suggests mild volume overload. She is alert but does remain altered. CT scan of the head reveals no acute findings and MRI cannot be accomplished as the patient has a pacemaker.  Assessment & Plan   Acute systolic CHF exacerbation -BNP 1709 -Per son, patient's weight is normally mid 160s -Last echocardiogram July 2016: EF 25%  -Repeat limited echo: EF 20-25% -Continue to monitor  Daily weights, intake and output -Continue Lasix -Cardiology consulted.  Son worried about EF not improving, ?PM change.  Acute metabolic encephalopathy -Possibly related to UTI -Appears to be improving -CT head: no acute intracranial abnormality -Cannot obtain MRI due to pacemaker -Possibly consider repeat CT 03/15/2015 -Of note, patient's son states that she has "some memory loss and forgetfulness" -Will obtain repeat CT head to rule out stroke  UTI -Urine culture >100K GPC -Continue ceftriaxone (spoke with pharmacy, will continue ceftriaxone pending species) -Patient currently afebrile, no leukocytosis -Holt cultures pending  Coronary artery disease status post RCA stent  -Last cath July 2016 w/o critical stenosis  -No evidence acutely to suggest acute coronary syndrome  Parox Afib  -On chronic Eliquis - Rate presently well controlled  Hx of CVA  -CT head negative -Continue Eliquis  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 Pending repeat CT head, urine culture. Will need  SNF. Expect ?D/c in 24-48 hours.  Time Spent in minutes   30 minutes  Procedures  Echocardiogram  Consults   None  DVT Prophylaxis  Eliquis  Lab Results  Component Value Date   PLT 243 03/15/2015    Medications  Scheduled Meds: . apixaban  5 mg Oral BID  . carvedilol  3.125 mg Oral BID WC  . cefTRIAXone (ROCEPHIN)  IV  1 g Intravenous Q24H  . furosemide  60 mg Intravenous Q12H  . lisinopril  2.5 mg Oral Daily  . methimazole  10 mg Oral Once per day on Mon Wed Fri  . pantoprazole  40 mg Oral Q0600  . polyethylene glycol  17 g Oral Daily  . simvastatin  40 mg Oral q1800   Continuous Infusions:   PRN Meds:.acetaminophen **OR** acetaminophen, ondansetron **OR** ondansetron (ZOFRAN) IV  Antibiotics    Anti-infectives    Start     Dose/Rate Route Frequency Ordered Stop   03/14/15 1200  cefTRIAXone (ROCEPHIN) 1 g in dextrose 5 % 50 mL IVPB     1 g 100 mL/hr over 30 Minutes Intravenous Every 24 hours 03/13/15 1342     03/13/15 1215  cefTRIAXone (ROCEPHIN) 1 g in dextrose 5 % 50 mL IVPB     1 g 100 mL/hr over 30 Minutes Intravenous  Once 03/13/15 1214 03/13/15 1306      Subjective:   [facility] seen and examined today.  Patient does speak some [place].  Denies constipation, abdominal pain, shortness of breath, chest pain.  Objective:   Filed Vitals:   03/15/15 2036 03/16/15 0048 03/16/15 0400 03/16/15 0447  BP: 111/68 119/57 102/62 136/63  Pulse: 65 64 65 68  Temp: 98.9 F (37.2 C) 97.5 F (36.4 C) 97.6 F (36.4 C) 98.3 F (36.8 C)  TempSrc: Oral Oral Oral Oral  Resp: Height:      Weight:   72.6 kg (160 lb 0.9 oz)   SpO2: 97% 100% 97%     Wt Readings from Last 3 Encounters:  03/16/15 72.6 kg (160 lb 0.9 oz)  01/21/15 76.204 kg (168 lb)  01/07/15 76.794 kg (169 lb 4.8 oz)     Intake/Output Summary (Last 24 hours) at 03/16/15 1058 Last data filed at 03/16/15 0943  Gross per 24 hour  Intake    850 ml  Output      0 ml  Net    850 ml    Exam  General: Well developed, well nourished, NAD  HEENT: NCAT, mucous membranes moist.   Cardiovascular: S1 S2 auscultated, RRR, no murmurs   Respiratory: Clear to auscultation bilaterally  Abdomen: Soft, nontender, nondistended, + bowel sounds  Extremities: warm dry without cyanosis clubbing. +1 LE edema  Neuro: AAOx3, nonfocal  Psych: appropriate mood and affect  Data Review   Micro Results Recent Results (from the past 240 hour(s))  Urine culture     Status: None (Preliminary result)   Collection Time: 03/13/15 11:42 AM  Result Value Ref Range Status   Specimen Description URINE, RANDOM   Final   Special Requests NONE  Final   Culture   Final    >=100,000 COLONIES/mL GRAM POSITIVE COCCI IDENTIFICATION TO FOLLOW    Report Status PENDING  Incomplete    Radiology Reports Dg Chest 2 View  03/13/2015   CLINICAL DATA:  Altered mental status  EXAM: CHEST  2 VIEW  COMPARISON:  12/29/2014  FINDINGS: There is cardiomegaly. Left pacer in stable position.  Elevation of the right hemidiaphragm. Minimal bibasilar atelectasis. No overt edema or effusions.  IMPRESSION: Cardiomegaly. Bibasilar atelectasis with elevation of the right hemidiaphragm   Electronically Signed   By: Charlett Nose M.D.   On: 03/13/2015 10:49   Ct Head Wo Contrast  03/13/2015   CLINICAL DATA:  Altered mental status. Increased weakness for 3 days.  EXAM: CT HEAD WITHOUT CONTRAST  TECHNIQUE: Contiguous axial images were obtained from the base of the skull through the vertex without intravenous contrast.  COMPARISON:  08/25/2014  FINDINGS: There is atrophy and chronic small vessel disease changes. No acute intracranial abnormality. Specifically, no hemorrhage, hydrocephalus, mass lesion, acute infarction, or significant intracranial injury. No acute calvarial abnormality. Visualized paranasal sinuses and mastoids clear. Orbital soft tissues unremarkable.  IMPRESSION: No acute intracranial abnormality.  Atrophy, chronic microvascular disease.   Electronically Signed   By: Charlett Nose M.D.   On: 03/13/2015 10:30    CBC  Recent Labs Lab 03/13/15 1000 03/14/15 0252 03/15/15 0320  WBC 7.0 9.9 8.5  HGB 14.3 14.6 14.6  HCT 45.6 46.7* 47.2*  PLT 234 233 243  MCV 87.0 88.6 89.4  MCH 27.3 27.7 27.7  MCHC 31.4 31.3 30.9  RDW 22.2* 22.2* 22.2*  LYMPHSABS 1.8  --   --   MONOABS 0.4  --   --   EOSABS 0.1  --   --   BASOSABS 0.1  --   --     Chemistries   Recent Labs Lab 03/13/15 1000 03/14/15 0252 03/15/15 0320 03/16/15 0315  NA 143 141 144 142  K 3.8 3.2* 3.4* 4.6  CL 101 97* 101 96*  CO2 31 35* 36* 35*    GLUCOSE 140* 138* 119* 116*  BUN 16 19 15  21*  CREATININE 0.90 1.05* 0.96 0.93  CALCIUM 9.1 8.7* 8.6* 8.6*  AST 32 28  --   --   ALT 22 24  --   --   ALKPHOS 66 66  --   --   BILITOT 0.9 0.7  --   --    ------------------------------------------------------------------------------------------------------------------ estimated creatinine clearance is 42.9 mL/min (by C-G formula based on Cr of 0.93). ------------------------------------------------------------------------------------------------------------------ No results for input(s): HGBA1C in the last 72 hours. ------------------------------------------------------------------------------------------------------------------ No results for input(s): CHOL, HDL, LDLCALC, TRIG, CHOLHDL, LDLDIRECT in the last 72 hours. ------------------------------------------------------------------------------------------------------------------ No results for input(s): TSH, T4TOTAL, T3FREE, THYROIDAB in the last 72 hours.  Invalid input(s): FREET3 ------------------------------------------------------------------------------------------------------------------ No results for input(s): VITAMINB12, FOLATE, FERRITIN, TIBC, IRON, RETICCTPCT in the last 72 hours.  Coagulation profile  Recent Labs Lab 03/13/15 1000  INR 1.87*    No results for input(s): DDIMER in the last 72 hours.  Cardiac Enzymes  Recent Labs Lab 03/13/15 1000  TROPONINI 0.05*   ------------------------------------------------------------------------------------------------------------------ Invalid input(s): POCBNP    MIKHAIL, MARYANN D.O. on 03/16/2015 at 10:58 AM  Between 7am to 7pm - Pager - [phone]  After 7pm go to [email or web address] - password [email or web address]  And look for the night coverage person covering for me after hours  [facility] Office  [phone]

## 2015-03-16 NOTE — Care Management Important Message (Signed)
Important Message  Patient Details  Name: [name] MRN: [id number] Date of Birth: Aug 04, 1931   Medicare Important Message Given:  Yes-second notification given    Orson Aloe 03/16/2015, 2:32 PM

## 2015-03-16 NOTE — Evaluation (Signed)
Physical Therapy Evaluation Patient Details Name: [name] MRN: [id number] DOB: 02-Jun-1932 Today's Date: 03/16/2015   History of Present Illness  81 y.o. female with a significant history of systolic congestive heart failure and coronary artery disease and paroxysmal atrial fibrillation who was brought to the ED via EMS accompanied by her son due to altered mental status, severe generalized weakness, dyspnea, and slurring of speech. The patient speaks [place] and her son translates for her though at the present time he states she is confused and he can't understand what she is saying because she is also slurring her words. Full history is provided by the son who states that 3-4 days ago he began to notice her becoming more and more dyspneic and weak in general with no focal deficits. Yesterday her dyspnea seemed to worsen and then today he found that she was significantly confused and also slurring her words. She is normally able to ambulate with assistance using a walker but at this time is not been him to get up out of bed. She has not complained of chest pain nausea vomiting or abdominal pain. She did have a self-limited episode of diarrhea yesterday.Positive for UTI.    Clinical Impression  Pt admitted with above diagnosis. Pt currently with functional limitations due to the deficits listed below (see PT Problem List). Pt appeared to understand gestures and some English.  She answered some questions about her home setup.  Pt was able to get to EOB with some assist but once at EOB pt would not attempt to stand even with max cues and encouragement.  She was sweaty as well.  No dizziness reported.  Pt just would not get up therefore told pt she could lie back down and pt was able to lie down without assist.  Will probably need NHP with therapy at d/c to incr strength prior to d/c home.  Pt will benefit from skilled PT to increase their independence and safety with mobility to allow discharge  to the venue listed below.      Follow Up Recommendations SNF;Supervision/Assistance - 24 hour    Equipment Recommendations  Other (comment) (TBA)    Recommendations for Other Services       Precautions / Restrictions Precautions Precautions: Fall Restrictions Weight Bearing Restrictions: No      Mobility  Bed Mobility Overal bed mobility: Needs Assistance Bed Mobility: Supine to Sit;Sit to Supine     Supine to sit: Min assist Sit to supine: Supervision   General bed mobility comments: Pt needed encouragement to sit up on EOB.  Once there, pt refused to stand up even with encouragement.    Transfers                 General transfer comment: Pt refused to get OOB.    Ambulation/Gait                Stairs            Wheelchair Mobility    Modified Rankin (Stroke Patients Only)       Balance Overall balance assessment: Needs assistance Sitting-balance support: No upper extremity supported;Feet supported Sitting balance-Leahy Scale: Fair Sitting balance - Comments: can sit EOB without UE support.                                       Pertinent Vitals/Pain Pain Assessment: No/denies pain  VSS with  2 LO2.      Home Living Family/patient expects to be discharged to:: Skilled nursing facility Living Arrangements: Children;Spouse/significant other Available Help at Discharge: Family;Available PRN/intermittently Type of Home: Apartment Home Access: Level entry     Home Layout: One level Home Equipment: Shower seat - built in;Shower seat;Grab bars - toilet;Hand held Careers information officer - 2 wheels (02) Additional Comments: home information from previous admission    Prior Function Level of Independence: Needs assistance   Gait / Transfers Assistance Needed: ambulated within her home with RW  ADL's / Homemaking Assistance Needed: Pt reports assistance for LB dressing PTA.  Comments: husband unable to assist, also on 02, son  works     Higher education careers adviser   Dominant Hand: Right    Extremity/Trunk Assessment   Upper Extremity Assessment: Defer to OT evaluation           Lower Extremity Assessment: Generalized weakness      Cervical / Trunk Assessment: Kyphotic  Communication   Communication: Prefers language other than English [place])  Cognition Arousal/Alertness: Awake/alert Behavior During Therapy: Anxious Overall Cognitive Status: History of cognitive impairments - at baseline       Memory: Decreased short-term memory              General Comments      Exercises        Assessment/Plan    PT Assessment Patient needs continued PT services  PT Diagnosis Generalized weakness   PT Problem List Decreased activity tolerance;Decreased balance;Decreased mobility;Decreased knowledge of use of DME;Decreased safety awareness;Decreased knowledge of precautions  PT Treatment Interventions DME instruction;Gait training;Functional mobility training;Therapeutic activities;Therapeutic exercise;Balance training;Patient/family education   PT Goals (Current goals can be found in the Care Plan section) Acute Rehab PT Goals Patient Stated Goal: to get better PT Goal Formulation: With patient Time For Goal Achievement: 03/30/15 Potential to Achieve Goals: Good    Frequency Min 3X/week   Barriers to discharge Decreased caregiver support      Co-evaluation               End of Session Equipment Utilized During Treatment: Gait belt;Oxygen Activity Tolerance: Patient limited by fatigue Patient left: with call bell/phone within reach;in bed;with bed alarm set Nurse Communication: Mobility status         Time: 0488-8916 PT Time Calculation (min) (ACUTE ONLY): 11 min   Charges:   PT Evaluation $Initial PT Evaluation Tier I: 1 Procedure     PT G CodesBerline Lopes 03/24/2015, 10:39 AM [facility] [phone] (pager)

## 2015-03-16 NOTE — Progress Notes (Signed)
[facility]  Patient Status: Active (receiving services up to time of hospitalization)  [facility] is providing the following services: PT  If patient discharges after hours, please call [phone].   [name] 03/16/2015, 10:35 AM

## 2015-03-16 NOTE — Clinical Social Work Note (Signed)
Clinical Social Work Assessment  Patient Details  Name: [name] MRN: [id number] Date of Birth: [birth year]  Date of referral:  03/16/15               Reason for consult:  Facility Placement                Permission sought to share information with:  Family Supports Permission granted to share information::  Yes, Verbal Permission Granted  Name::     [name]  Agency::     Relationship::  son  Contact Information:  [phone]  Housing/Transportation Living arrangements for the past 2 months:  Single Family Home Source of Information:  Adult Children Patient Interpreter Needed:  [place], Other (Comment Required) (Pt speaks broken English and able to understand basic conversation) Criminal Activity/Legal Involvement Pertinent to Current Situation/Hospitalization:  No - Comment as needed Significant Relationships:  Adult Children Lives with:  Adult Children Do you feel safe going back to the place where you live?  Yes Need for family participation in patient care:  Yes (Comment)  Care giving concerns:  Pt needing rehab per PT recommendation   Social Worker assessment / plan:  CSW visited pt room but because of language barrier unable to fully complete assessment. Pt agreeable to CSW calling her son Shannon to get necessary initial information and returning with interpreter to confirm information and wish with pt. Pt is agreeable to her son making decisions as well as needed. CSW spoke with Shannon who confirmed pt has been to rehab before at [facility]. Pt son unsure if he would like pt to return to facility. Pt has been to this facility multiple times but the last visit pt son feels pt was discharged too early. He did however note many positive to pt placement at facility. CSW explained SNF referral process. Pt son would like referral sent to all facilities including [facility] to keep his options open at this time.  He did ask CSW multiple questions regarding insurance coverage for ST and  long term care. CSW provided answers as best possible. CSW did notify pt son that facility of choice will need to run insurance to confirm if pt will be continuing on previous rehab days or has started fresh after qualifying wellness period. CSW will also ask SNFs during referral process for long term costs to provide to pt son.  Employment status:  Retired Health and safety inspector:  [employer] PT Recommendations:  Skilled Nursing Facility Information / Referral to community resources:  Skilled Nursing Facility  Patient/Family's Response to care:  Pt son agreeable to plan of care  Patient/Family's Understanding of and Emotional Response to Diagnosis, Current Treatment, and Prognosis:  Pt son has a strong understanding of pt condition. Pt son in good spirits but appropriately concerned for appropriate aftercare for pt.  Emotional Assessment Appearance:  Appears stated age, Well-Groomed Attitude/Demeanor/Rapport:  Unable to Assess Affect (typically observed):  Unable to Assess Orientation:  Oriented to Self, Oriented to Place, Oriented to  Time, Oriented to Situation Alcohol / Substance use:  Not Applicable Psych involvement (Current and /or in the community):     Discharge Needs  Concerns to be addressed:  Discharge Planning Concerns Readmission within the last 30 days:  No Current discharge risk:  Dependent with Mobility Barriers to Discharge:  Continued Medical Work up   [employer], LCSW [phone]

## 2015-03-17 DIAGNOSIS — E0590 Thyrotoxicosis, unspecified without thyrotoxic crisis or storm: Secondary | ICD-10-CM

## 2015-03-17 DIAGNOSIS — N390 Urinary tract infection, site not specified: Secondary | ICD-10-CM | POA: Insufficient documentation

## 2015-03-17 DIAGNOSIS — G92 Toxic encephalopathy: Secondary | ICD-10-CM

## 2015-03-17 DIAGNOSIS — R4781 Slurred speech: Secondary | ICD-10-CM | POA: Insufficient documentation

## 2015-03-17 DIAGNOSIS — G9340 Encephalopathy, unspecified: Secondary | ICD-10-CM

## 2015-03-17 LAB — BASIC METABOLIC PANEL
Anion gap: 9 (ref 5–15)
BUN: 21 mg/dL — ABNORMAL HIGH (ref 6–20)
CALCIUM: 8.4 mg/dL — AB (ref 8.9–10.3)
CHLORIDE: 95 mmol/L — AB (ref 101–111)
CO2: 35 mmol/L — ABNORMAL HIGH (ref 22–32)
CREATININE: 0.79 mg/dL (ref 0.44–1.00)
Glucose, Bld: 181 mg/dL — ABNORMAL HIGH (ref 65–99)
Potassium: 3.2 mmol/L — ABNORMAL LOW (ref 3.5–5.1)
SODIUM: 139 mmol/L (ref 135–145)

## 2015-03-17 MED ORDER — FUROSEMIDE 40 MG PO TABS
40.0000 mg | ORAL_TABLET | Freq: Two times a day (BID) | ORAL | Status: DC
  Administered 2015-03-17 – 2015-03-18 (×2): 40 mg via ORAL
  Filled 2015-03-17 (×2): qty 1

## 2015-03-17 NOTE — Clinical Social Work Placement (Signed)
   CLINICAL SOCIAL WORK PLACEMENT  NOTE  Date:  03/17/2015  Patient Details  Name: [name] MRN: [id number] Date of Birth: 29-Mar-1932  Clinical Social Work is seeking post-discharge placement for this patient at the Skilled  Nursing Facility level of care (*CSW will initial, date and re-position this form in  chart as items are completed):  Yes   Patient/family provided with [facility] Clinical Social Work Department's list of facilities offering this level of care within the geographic area requested by the patient (or if unable, by the patient's family).  Yes   Patient/family informed of their freedom to choose among providers that offer the needed level of care, that participate in Medicare, Medicaid or managed care program needed by the patient, have an available bed and are willing to accept the patient.  Yes   Patient/family informed of [facility]'s ownership interest in [facility] and [facility], as well as of the fact that they are under no obligation to receive care at these facilities.  PASRR submitted to EDS on       PASRR number received on       Existing PASRR number confirmed on 03/17/15     FL2 transmitted to all facilities in geographic area requested by pt/family on 03/17/15     FL2 transmitted to all facilities within larger geographic area on       Patient informed that his/her managed care company has contracts with or will negotiate with certain facilities, including the following:            Patient/family informed of bed offers received.  Patient chooses bed at       Physician recommends and patient chooses bed at      Patient to be transferred to   on  .  Patient to be transferred to facility by       Patient family notified on   of transfer.  Name of family member notified:        PHYSICIAN Please prepare priority discharge summary, including medications, Please sign FL2, Please prepare prescriptions     Additional Comment:     _______________________________________________ Darylene Price, LCSW 03/17/2015, 11:52 AM

## 2015-03-17 NOTE — Progress Notes (Signed)
Occupational Therapy Evaluation Patient Details Name: [name] MRN: [id number] DOB: 1931/09/15 Today's Date: 03/17/2015    History of Present Illness 81 y.o. female with a significant history of systolic congestive heart failure and coronary artery disease and paroxysmal atrial fibrillation who was brought to the ED via EMS accompanied by her son due to altered mental status, severe generalized weakness, dyspnea, and slurring of speech. She is normally able to ambulate with assistance using a walker but at this time is not able to get up out of bed. She has not complained of chest pain nausea vomiting or abdominal pain.episode of diarrhea yesterday.Positive for UTI.     Clinical Impression   PTA, pt was mod I with ambulation and required Min A with LB ADL. Pt currently presents with below deficits and will benefit from rehab at SNF to facilitate safe D/C home. [place] phone interpreter [phone] used during session. Nsg notified pt up in chair. Will follow acutely to address established goals.     Follow Up Recommendations  SNF;Supervision/Assistance - 24 hour    Equipment Recommendations  None recommended by OT    Recommendations for Other Services       Precautions / Restrictions Precautions Precautions: Fall Restrictions Weight Bearing Restrictions: No      Mobility Bed Mobility Overal bed mobility: Needs Assistance Bed Mobility: Supine to Sit;Sit to Supine     Supine to sit: Min assist     General bed mobility comments: HOB elevated  Transfers Overall transfer level: Needs assistance   Transfers: Sit to/from Stand;Stand Pivot Transfers Sit to Stand: Mod assist Stand pivot transfers: Mod assist       General transfer comment: not using RW. Pt expressing a fear of falling    Balance Overall balance assessment: Needs assistance   Sitting balance-Leahy Scale: Fair       Standing balance-Leahy Scale: Poor                               ADL Overall ADL's : Needs assistance/impaired     Grooming: Set up;Sitting   Upper Body Bathing: Minimal assitance;Sitting   Lower Body Bathing: Maximal assistance;Sit to/from stand   Upper Body Dressing : Moderate assistance;Sitting   Lower Body Dressing: Maximal assistance   Toilet Transfer: +2 for physical assistance;Moderate assistance   Toileting- Clothing Manipulation and Hygiene: Total assistance Toileting - Clothing Manipulation Details (indicate cue type and reason): incontinent     Functional mobility during ADLs: +2 for physical assistance;Moderate assistance - recommend for safety General ADL Comments: Decline in functional level     Vision     Perception     Praxis      Pertinent Vitals/Pain Pain Assessment: Faces Faces Pain Scale: Hurts a little bit Pain Location: stomach Pain Descriptors / Indicators: Discomfort Pain Intervention(s): Limited activity within patient's tolerance     Hand Dominance Right   Extremity/Trunk Assessment Upper Extremity Assessment Upper Extremity Assessment: Generalized weakness   Lower Extremity Assessment Lower Extremity Assessment: Generalized weakness   Cervical / Trunk Assessment Cervical / Trunk Assessment: Kyphotic   Communication Communication Communication: Prefers language other than English [place])   Cognition Arousal/Alertness: Awake/alert Behavior During Therapy: Anxious;Flat affect Overall Cognitive Status: No family/caregiver present to determine baseline cognitive functioning       Memory: Decreased short-term memory             General Comments       Exercises  Shoulder Instructions      Home Living Family/patient expects to be discharged to:: Skilled nursing facility Living Arrangements: Children;Spouse/significant other Available Help at Discharge: Family;Available PRN/intermittently Type of Home: Apartment Home Access: Level entry     Home Layout: One level      Bathroom Shower/Tub: Chief Strategy Officer: Standard     Home Equipment: Shower seat - built in;Shower seat;Grab bars - toilet;Hand held Careers information officer - 2 wheels (02)   Additional Comments: home information from previous admission      Prior Functioning/Environment Level of Independence: Needs assistance  Gait / Transfers Assistance Needed: ambulated within her home with RW ADL's / Homemaking Assistance Needed: Pt reports assistance for LB dressing PTA. Communication / Swallowing Assistance Needed: Pt's primary language in [place], speaks some [place]. Comments: husband unable to assist, also on 02, son works    OT Diagnosis: Generalized weakness   OT Problem List: Decreased strength;Decreased activity tolerance;Impaired balance (sitting and/or standing);Decreased knowledge of use of DME or AE   OT Treatment/Interventions: Self-care/ADL training;Therapeutic exercise;Energy conservation;DME and/or AE instruction;Therapeutic activities;Patient/family education;Balance training    OT Goals(Current goals can be found in the care plan section) Acute Rehab OT Goals Patient Stated Goal: to get stonger and go home OT Goal Formulation: With patient (through interpreter) Time For Goal Achievement: 03/31/15 Potential to Achieve Goals: Good ADL Goals Pt Will Perform Upper Body Bathing: with set-up;sitting Pt Will Perform Lower Body Bathing: with min assist;with adaptive equipment;sit to/from stand Pt Will Perform Upper Body Dressing: with set-up;sitting Pt Will Transfer to Toilet: with min assist;bedside commode;stand pivot transfer Pt Will Perform Toileting - Clothing Manipulation and hygiene: with min assist;sit to/from stand  OT Frequency: Min 2X/week   Barriers to D/C: Decreased caregiver support (elderly husband. Son works)          Biomedical scientist During Treatment: Gait belt;Oxygen (2L) Nurse Communication:  Mobility status  Activity Tolerance: Patient tolerated treatment well Patient left: in chair;with call bell/phone within reach;with chair alarm set   Time: 1410-1453 OT Time Calculation (min): 43 min Charges:  OT General Charges $OT Visit: 1 Procedure OT Evaluation $Initial OT Evaluation Tier I: 1 Procedure OT Treatments $Self Care/Home Management : 23-37 mins G-Codes:    WARD,HILLARY 03-30-2015, 3:05 PM   [facility], OTR/L  [phone] 2015/03/30

## 2015-03-17 NOTE — Progress Notes (Signed)
PROGRESS NOTE  SATSUKI BERGANZA LJQ:[id number] DOB: Feb 22, 1932 DOA: 03/13/2015 PCP: [name], NP  Brief History 81 y.o. female with a significant history of systolic congestive heart failure and coronary artery disease and paroxysmal atrial fibrillation who was brought to the ED via EMS accompanied by her son due to altered mental status, severe generalized weakness, dyspnea, and slurring of speech. The patient speaks [place] and her son translates for her though at the present time he stated she was confused and he could not understand what she was saying because she was slurring her words. Full history is provided by the son who states that 3-4 days ago he began to notice her becoming more and more dyspneic and weak in general with no focal deficits.On 03/12/15, her dyspnea seemed to worsen and on 9/24,  he found that she was significantly confused and also slurring her words. She is normally able to ambulate with assistance using a walker but at this time is not been him to get up out of bed. She has not complained of chest pain nausea vomiting or abdominal pain. She did have a self-limited episode of diarrhea yesterday. Assessment/Plan: Acute systolic CHF exacerbation -BNP 1709 -Per son, patient's weight is normally mid 160s -03/17/2015 weight 158 pounds -NEG 4 pounds since admission -Last echocardiogram July 2016: EF 25%  -03/15/2015 Repeat limited echo: EF 20-25% -Continue to monitor Daily weights, intake and output -03/17/2015--appears euvolemic -Discontinue intravenous furosemide, start by mouth furosemide  Acute metabolic encephalopathy -related to UTI -Appears to be improving -03/16/2015 repeat CT head: no acute intracranial abnormality -Cannot obtain MRI due to pacemaker -Of note, patient's son states that she has "some memory loss and forgetfulness"  UTI -Urine culture >100K Aerococcus urinae -Continue ceftriaxone  -Patient currently afebrile, no  leukocytosis -Blood cultures pending  Coronary artery disease status post RCA stent  -Heart catheterization 01/06/2015--mild nonobstructive CAD, patent RCA stent -No evidence acutely to suggest acute coronary syndrome -History of BMS to RCA +overlying DES to RCA 2011  Parox Afib  -On chronic Eliquis - Rate presently well controlled -Continue carvedilol-  Hx of CVA  -CT head negative x 2 -Continue Eliquis  SSS -s/p PPM  Hypertension -Continue lasix, coreg  Hyperthyroidism  -Continue methimazole  Hyperlipidemia -Continue statin  Deconditioning -PT recommended SNF -SW consulted    Family Communication:   Son updated on phone Disposition Plan:   SNF on 03/18/15       Procedures/Studies: Dg Chest 2 View  03/13/2015   CLINICAL DATA:  Altered mental status  EXAM: CHEST  2 VIEW  COMPARISON:  12/29/2014  FINDINGS: There is cardiomegaly. Left pacer in stable position. Elevation of the right hemidiaphragm. Minimal bibasilar atelectasis. No overt edema or effusions.  IMPRESSION: Cardiomegaly. Bibasilar atelectasis with elevation of the right hemidiaphragm   Electronically Signed   By: Charlett Nose M.D.   On: 03/13/2015 10:49   Ct Head Wo Contrast  03/16/2015   CLINICAL DATA:  Generalized weakness dyspnea and slurred speech  EXAM: CT HEAD WITHOUT CONTRAST  TECHNIQUE: Contiguous axial images were obtained from the base of the skull through the vertex without intravenous contrast.  COMPARISON:  03/13/2015  FINDINGS: Age-appropriate significant diffuse atrophy. Moderate to severe low attenuation in the deep periventricular white matter. Proportional prominence of ventricles without evidence of hydrocephalus. No evidence of vascular territory infarct. No evidence of mass. No hemorrhage or extra-axial fluid. Calvarium intact. Visualized portions of the paranasal sinuses are  clear.  IMPRESSION: Significant age-related involutional change with no acute findings   Electronically Signed    By: Esperanza Heir M.D.   On: 03/16/2015 13:32   Ct Head Wo Contrast  03/13/2015   CLINICAL DATA:  Altered mental status. Increased weakness for 3 days.  EXAM: CT HEAD WITHOUT CONTRAST  TECHNIQUE: Contiguous axial images were obtained from the base of the skull through the vertex without intravenous contrast.  COMPARISON:  08/25/2014  FINDINGS: There is atrophy and chronic small vessel disease changes. No acute intracranial abnormality. Specifically, no hemorrhage, hydrocephalus, mass lesion, acute infarction, or significant intracranial injury. No acute calvarial abnormality. Visualized paranasal sinuses and mastoids clear. Orbital soft tissues unremarkable.  IMPRESSION: No acute intracranial abnormality.  Atrophy, chronic microvascular disease.   Electronically Signed   By: Charlett Nose M.D.   On: 03/13/2015 10:30        Subjective: Patient is more alert. No reports of chest pain, shortness breath, increased work of breathing, vomiting, diarrhea, abdominal pain.   Objective: Filed Vitals:   03/16/15 1201 03/16/15 2006 03/17/15 0520 03/17/15 0822  BP: 99/64 96/41 112/71 112/70  Pulse: 62 70 62 69  Temp: 98.6 F (37 C) 98.4 F (36.9 C) 97.7 F (36.5 C) 97.8 F (36.6 C)  TempSrc: Oral Oral Oral Oral  Resp: Height:      Weight:   71.913 kg (158 lb 8.6 oz)   SpO2: 100% 98% 98% 99%    Intake/Output Summary (Last 24 hours) at 03/17/15 1133 Last data filed at 03/17/15 0945  Gross per 24 hour  Intake    775 ml  Output      0 ml  Net    775 ml   Weight change: -0.687 kg (-1 lb 8.2 oz) Exam:   General:  Pt is alert, follows commands appropriately, not in acute distress  HEENT: No icterus, No thrush, No neck mass, [place]/AT  Cardiovascular: RRR, S1/S2, no rubs, no gallops  Respiratory: Bibasilar crackles. No wheezing.   Abdomen: Soft/+BS, non tender, non distended, no guarding  Extremities: No edema, No lymphangitis, No petechiae, No rashes, no synovitis  Data  Reviewed: Basic Metabolic Panel:  Recent Labs Lab 03/13/15 1000 03/14/15 0252 03/15/15 0320 03/16/15 0315 03/17/15 0914  NA 143 141 144 142 139  K 3.8 3.2* 3.4* 4.6 3.2*  CL 101 97* 101 96* 95*  CO2 31 35* 36* 35* 35*  GLUCOSE 140* 138* 119* 116* 181*  BUN 21* 21*  CREATININE 0.90 1.05* 0.96 0.93 0.79  CALCIUM 9.1 8.7* 8.6* 8.6* 8.4*   Liver Function Tests:  Recent Labs Lab 03/13/15 1000 03/14/15 0252  AST 32 28  ALT 22 24  ALKPHOS 66 66  BILITOT 0.9 0.7  PROT 6.1* 6.5  ALBUMIN 3.3* 3.2*   No results for input(s): LIPASE, AMYLASE in the last 168 hours. No results for input(s): AMMONIA in the last 168 hours. CBC:  Recent Labs Lab 03/13/15 1000 03/14/15 0252 03/15/15 0320  WBC 7.0 9.9 8.5  NEUTROABS 4.6  --   --   HGB 14.3 14.6 14.6  HCT 45.6 46.7* 47.2*  MCV 87.0 88.6 89.4  PLT 234 233 243   Cardiac Enzymes:  Recent Labs Lab 03/13/15 1000  TROPONINI 0.05*   BNP: Invalid input(s): POCBNP CBG: No results for input(s): GLUCAP in the last 168 hours.  Recent Results (from the past 240 hour(s))  Urine culture     Status: None  Collection Time: 03/13/15 11:42 AM  Result Value Ref Range Status   Specimen Description URINE, RANDOM  Final   Special Requests NONE  Final   Culture >=100,000 COLONIES/mL AEROCOCCUS URINAE  Final   Report Status 03/16/2015 FINAL  Final     Scheduled Meds: . apixaban  5 mg Oral BID  . carvedilol  3.125 mg Oral BID WC  . cefTRIAXone (ROCEPHIN)  IV  1 g Intravenous Q24H  . furosemide  40 mg Oral BID  . lisinopril  2.5 mg Oral Daily  . methimazole  10 mg Oral Once per day on Mon Wed Fri  . pantoprazole  40 mg Oral Q0600  . polyethylene glycol  17 g Oral Daily  . simvastatin  40 mg Oral q1800   Continuous Infusions:    TAT, DAVID, DO  [facility] Pager [phone]  If 7PM-7AM, please contact night-coverage [email or web address] Password [facility] 03/17/2015, 11:33 AM   LOS: 4 days

## 2015-03-17 NOTE — Progress Notes (Signed)
CSW spoke to patient's son [name] via phone multiple times today and current bed offers provided.  He stated that he would look of the available facilities online and would call in the morning with bed choice. Discussed that per MD- patient will be medically stable for d/c tomorrow and son verbalized understanding of same.  Will speak with son tomorrow to determine bed choice.  [name]. Jaci Lazier, [place] [phone]

## 2015-03-18 ENCOUNTER — Inpatient Hospital Stay (HOSPITAL_COMMUNITY): Payer: Medicare Other

## 2015-03-18 ENCOUNTER — Encounter: Payer: Self-pay | Admitting: *Deleted

## 2015-03-18 ENCOUNTER — Telehealth: Payer: Self-pay

## 2015-03-18 DIAGNOSIS — I5043 Acute on chronic combined systolic (congestive) and diastolic (congestive) heart failure: Secondary | ICD-10-CM

## 2015-03-18 MED ORDER — AMOXICILLIN 500 MG PO CAPS
500.0000 mg | ORAL_CAPSULE | Freq: Three times a day (TID) | ORAL | Status: DC
Start: 2015-03-18 — End: 2015-03-22

## 2015-03-18 MED ORDER — AMOXICILLIN 500 MG PO CAPS
500.0000 mg | ORAL_CAPSULE | Freq: Three times a day (TID) | ORAL | Status: DC
Start: 2015-03-18 — End: 2015-03-18
  Administered 2015-03-18: 500 mg via ORAL
  Filled 2015-03-18: qty 1

## 2015-03-18 MED ORDER — POTASSIUM CHLORIDE CRYS ER 20 MEQ PO TBCR
40.0000 meq | EXTENDED_RELEASE_TABLET | Freq: Once | ORAL | Status: AC
  Administered 2015-03-18: 40 meq via ORAL
  Filled 2015-03-18: qty 2

## 2015-03-18 NOTE — Progress Notes (Signed)
Physical Therapy Treatment Patient Details Name: [name] MRN: [id number] DOB: 1931-07-15 Today's Date: 03/18/2015    History of Present Illness 81 y.o. female with a significant history of systolic congestive heart failure and coronary artery disease and paroxysmal atrial fibrillation who was brought to the ED via EMS accompanied by her son due to altered mental status, severe generalized weakness, dyspnea, and slurring of speech. The patient speaks [place] and her son translates for her though at the present time he states she is confused and he can't understand what she is saying because she is also slurring her words. Full history is provided by the son who states that 3-4 days ago he began to notice her becoming more and more dyspneic and weak in general with no focal deficits. Yesterday her dyspnea seemed to worsen and then today he found that she was significantly confused and also slurring her words. She is normally able to ambulate with assistance using a walker but at this time is not been him to get up out of bed. She has not complained of chest pain nausea vomiting or abdominal pain. She did have a self-limited episode of diarrhea yesterday.Positive for UTI.      PT Comments    Patient in bed, agreeable to participate in PT today after much encouragement. Interpreter [phone] was used to improve communication with patient - she understands some [place] but responds mostly in [place]. She reported dizziness in sitting that did not get better. She eventually was able to transfer as described below. Her hair was washed with a shampoo cap and she was able to wash her face while in the chair, was agreeable to sitting in the chair at least one hour. Of note, the paper pad under her in the bed was wet. Patient will benefit from continued PT to increase her mobility and transfers.   Follow Up Recommendations  SNF;Supervision/Assistance - 24 hour     Equipment Recommendations  Other (comment) (TBA)    Recommendations for Other Services       Precautions / Restrictions Precautions Precautions: Fall Precaution Comments: Speaks [place], understands some English Restrictions Weight Bearing Restrictions: No    Mobility  Bed Mobility Overal bed mobility: Needs Assistance Bed Mobility: Supine to Sit     Supine to sit: Mod assist     General bed mobility comments: Min A for LE to EOB and mod A to pull trunk up.  Transfers Overall transfer level: Needs assistance Equipment used: 2 person hand held assist Transfers: Sit to/from [employer] Sit to Stand: Mod assist;+2 physical assistance Stand pivot transfers: Mod assist;+2 physical assistance       General transfer comment: Patient stood with some difficulty. Was very hesitant to, took max encouragement and explanation of importance of mobility via interpreter to convince patient to transfer. Once in standing, she wanted to sit down quickly, plopped into chair.  Ambulation/Gait             General Gait Details: Deferred.   Stairs            Wheelchair Mobility    Modified Rankin (Stroke Patients Only)       Balance Overall balance assessment: Needs assistance Sitting-balance support: Single extremity supported Sitting balance-Leahy Scale: Fair Sitting balance - Comments: Was using R UE for support throughout sitting. Reported she felt dizzy. BP was 113/73 initially, rose to 117/64.   Standing balance support: Bilateral upper extremity supported Standing balance-Leahy Scale: Poor  Cognition Arousal/Alertness: Awake/alert Behavior During Therapy: WFL for tasks assessed/performed Overall Cognitive Status: No family/caregiver present to determine baseline cognitive functioning                      Exercises      General Comments        Pertinent Vitals/Pain Pain Assessment: 0-10 Pain Score: 8  Pain Location: R knee,  PMH of knee surgery per scar Pain Descriptors / Indicators: Discomfort Pain Intervention(s): Repositioned (Pillow under knee in chair, patient reports feels better.)  BP initially in sitting = 115/73 BP in sitting after a minute = 117/64 BP after transfer, sitting in chair = 106/59 Nursing in the room, aware of vitals.    Home Living                      Prior Function            PT Goals (current goals can now be found in the care plan section) Acute Rehab PT Goals Patient Stated Goal: Get better PT Goal Formulation: With patient Time For Goal Achievement: 03/30/15 Potential to Achieve Goals: Good Progress towards PT goals: Progressing toward goals    Frequency  Min 2X/week    PT Plan Frequency needs to be updated    Co-evaluation             End of Session   Activity Tolerance: Patient limited by fatigue Patient left: in chair;with call bell/phone within reach;with chair alarm set;with nursing/sitter in room     Time: 0932-0959 PT Time Calculation (min) (ACUTE ONLY): 27 min  Charges:  $Therapeutic Activity: 8-22 mins $Self Care/Home Management: Feb 19, 2023                    G CodesMichele Rockers, SPT [phone] 03/18/2015, 11:15 AM  I have read, reviewed and agree with student's note.   [facility] Acute Rehabilitation [phone] (pager)

## 2015-03-18 NOTE — Clinical Social Work Placement (Signed)
   CLINICAL SOCIAL WORK PLACEMENT  NOTE  Date:  03/18/2015  Patient Details  Name: [name] MRN: [id number] Date of Birth: [birth year]  Clinical Social Work is seeking post-discharge placement for this patient at the Skilled  Nursing Facility level of care (*CSW will initial, date and re-position this form in  chart as items are completed):  Yes   Patient/family provided with [place] Clinical Social Work Department's list of facilities offering this level of care within the geographic area requested by the patient (or if unable, by the patient's family).  Yes   Patient/family informed of their freedom to choose among providers that offer the needed level of care, that participate in Medicare, Medicaid or managed care program needed by the patient, have an available bed and are willing to accept the patient.  Yes   Patient/family informed of 's ownership interest in [facility] and [facility], as well as of the fact that they are under no obligation to receive care at these facilities.  PASRR submitted to EDS on       PASRR number received on       Existing PASRR number confirmed on 03/17/15     FL2 transmitted to all facilities in geographic area requested by pt/family on 03/17/15     FL2 transmitted to all facilities within larger geographic area on       Patient informed that his/her managed care company has contracts with or will negotiate with certain facilities, including the following:        Yes   Patient/family informed of bed offers received.  Patient chooses bed at [facility]     Physician recommends and patient chooses bed at      Patient to be transferred to [facility] on 03/18/15.  Patient to be transferred to facility by Ambulance Sharin Mons)     Patient family notified on 03/18/15 of transfer.  Name of family member notified:  Son:  Ivor Messier     PHYSICIAN Please sign FL2, Please prepare prescriptions, Please prepare priority  discharge summary, including medications     Additional Comment: DC to SNF this afternoon.  Lorri Frederick. Andria Rhein [phone]      _______________________________________________ Darylene Price, LCSW 03/18/2015, 1:51 PM

## 2015-03-18 NOTE — Discharge Summary (Addendum)
Physician Discharge Summary  GEMINI BUNTE ZOX:[id number] DOB: [birth year] DOA: 03/13/2015  PCP: [name], NP  Admit date: 03/13/2015 Discharge date: 03/18/2015  Recommendations for Outpatient Follow-up:  1. Pt will need to follow up with PCP in 2 weeks post discharge 2. Please obtain BMP in one week  Discharge Diagnoses:  Acute systolic CHF exacerbation -BNP 1709 -Per son, patient's weight is normally mid 160s -03/18/2015 weight 162 pounds -Last echocardiogram July 2016: EF 25%  -03/15/2015 Repeat limited echo: EF 20-25% -Continue to monitor Daily weights, intake and output -03/17/2015--appears euvolemic -9/28--Discontinue intravenous furosemide, start by mouth furosemide -Patient will be discharged with furosemide 40 mg po twice a day  Acute metabolic encephalopathy -related to UTI -Appears to be improving -03/16/2015 repeat CT head: no acute intracranial abnormality -Cannot obtain MRI due to pacemaker -Of note, patient's son states that she has "some memory loss and forgetfulness"  UTI -Urine culture >100K Aerococcus urinae -Continue ceftriaxone  -Discharge with 2 additional days of antibiotics to complete 7 days of therapy- -Patient currently afebrile, no leukocytosis -Blood cultures neg  Coronary artery disease status post RCA stent  -Heart catheterization 01/06/2015--mild nonobstructive CAD, patent RCA stent -No evidence acutely to suggest acute coronary syndrome -History of BMS to RCA +overlying DES to RCA 2011  Parox Afib  -On chronic Eliquis - Rate presently well controlled -Continue carvedilol-  Right Knee Pain -03/18/15 xray R-knee--Hemiarthroplasty in the medial compartment. Satisfactory appearanceof the implant.  Mild joint space narrowing and spurring laterally and in the patellofemoral joint.  Hx of CVA  -CT head negative x 2 -Continue Eliquis  SSS -s/p PPM  Hypertension -Continue lasix, coreg  Hyperthyroidism  -Continue  methimazole  Hyperlipidemia -Continue statin  Deconditioning -PT recommended SNF -SW consulted  Discharge Condition: stable  Disposition: SNF  Diet:heart healthy Wt Readings from Last 3 Encounters:  03/18/15 73.592 kg (162 lb 3.8 oz)  01/21/15 76.204 kg (168 lb)  01/07/15 76.794 kg (169 lb 4.8 oz)    History of present illness:  81 y.o. female with a significant history of systolic congestive heart failure and coronary artery disease and paroxysmal atrial fibrillation who was brought to the ED via EMS accompanied by her son due to altered mental status, severe generalized weakness, dyspnea, and slurring of speech. The patient speaks [place] and her son translates for her though at the present time he stated she was confused and he could not understand what she was saying because she was slurring her words. Full history is provided by the son who states that 3-4 days ago he began to notice her becoming more and more dyspneic and weak in general with no focal deficits.On 03/12/15, her dyspnea seemed to worsen and on 9/24, he found that she was significantly confused and also slurring her words. She is normally able to ambulate with assistance using a walker but at this time is not been him to get up out of bed. She has not complained of chest pain nausea vomiting or abdominal pain.The patient was found to have UTI. She was started on ceftriaxone. Her mental status improved. On 03/17/2015, the patient's son stated that her mental status was at baseline. Physical therapy evaluated the patient. They recommended skilled nursing facility with which the patient and son agreed.    Discharge Exam: Filed Vitals:   03/18/15 0454  BP: 126/65  Pulse: 63  Temp: 97.1 F (36.2 C)  Resp: 18   Filed Vitals:   03/17/15 2044 03/18/15 0029 03/18/15 0449 03/18/15 8119  BP: 112/64 116/69  126/65  Pulse: 65 64  63  Temp: 97.5 F (36.4 C) 97.7 F (36.5 C)  97.1 F (36.2 C)  TempSrc: Oral Oral   Oral  Resp: Height:      Weight:   73.592 kg (162 lb 3.8 oz)   SpO2: 97% 98%  98%   General: A&O x 3, NAD, pleasant, cooperative Cardiovascular: RRR, no rub, no gallop, no S3 Respiratory: Bibasilar crackles. No wheeze  Abdomen:soft, nontender, nondistended, positive bowel sounds Extremities: No edema, No lymphangitis, no petechiae  Discharge Instructions      Discharge Instructions    Diet - low sodium heart healthy    Complete by:  As directed      Increase activity slowly    Complete by:  As directed             Medication List    TAKE these medications        alendronate 70 MG tablet  Commonly known as:  FOSAMAX  Take 1 tablet (70 mg total) by mouth once a week. Take with a full glass of water on an empty stomach.     amoxicillin 500 MG capsule  Commonly known as:  AMOXIL  Take 1 capsule (500 mg total) by mouth every 8 (eight) hours.     apixaban 5 MG Tabs tablet  Commonly known as:  ELIQUIS  Take 1 tablet (5 mg total) by mouth 2 (two) times daily.     Calcium Carbonate-Vit D-Min 600-400 MG-UNIT Tabs  Take 1 tablet by mouth 2 (two) times daily.     carvedilol 3.125 MG tablet  Commonly known as:  COREG  Take 1 tablet (3.125 mg total) by mouth 2 (two) times daily with a meal.     CENTRUM SILVER tablet  Take 1 tablet by mouth daily.     furosemide 40 MG tablet  Commonly known as:  LASIX  Take 1 tablet (40 mg total) by mouth 2 (two) times daily.     lisinopril 2.5 MG tablet  Commonly known as:  PRINIVIL,ZESTRIL  TAKE 1 TABLET BY MOUTH EVERY DAY     methimazole 10 MG tablet  Commonly known as:  TAPAZOLE  Take 10 mg by mouth 3 (three) times a week. Monday, Wednesday, and Friday     nitroGLYCERIN 0.4 MG SL tablet  Commonly known as:  NITROSTAT  Place 1 tablet (0.4 mg total) under the tongue every 5 (five) minutes as needed. For chest pain     pantoprazole 40 MG tablet  Commonly known as:  PROTONIX  Take 1 tablet (40 mg total) by mouth daily  at 6 (six) AM.     polyethylene glycol packet  Commonly known as:  MIRALAX / GLYCOLAX  Take 17 g by mouth daily.     potassium chloride 10 MEQ tablet  Commonly known as:  K-DUR  Take 1 tablet (10 mEq total) by mouth daily.     sertraline 50 MG tablet  Commonly known as:  ZOLOFT  Take 1 tablet (50 mg total) by mouth daily.     simvastatin 40 MG tablet  Commonly known as:  ZOCOR  TAKE 1 TABLET BY MOUTH EVERY DAY AT 6PM     vitamin C 500 MG tablet  Commonly known as:  ASCORBIC ACID  Take 500 mg by mouth daily.         The results of significant diagnostics from this hospitalization (including imaging, microbiology, ancillary and  laboratory) are listed below for reference.    Significant Diagnostic Studies: Dg Chest 2 View  03/13/2015   CLINICAL DATA:  Altered mental status  EXAM: CHEST  2 VIEW  COMPARISON:  12/29/2014  FINDINGS: There is cardiomegaly. Left pacer in stable position. Elevation of the right hemidiaphragm. Minimal bibasilar atelectasis. No overt edema or effusions.  IMPRESSION: Cardiomegaly. Bibasilar atelectasis with elevation of the right hemidiaphragm   Electronically Signed   By: Charlett Nose M.D.   On: 03/13/2015 10:49   Ct Head Wo Contrast  03/16/2015   CLINICAL DATA:  Generalized weakness dyspnea and slurred speech  EXAM: CT HEAD WITHOUT CONTRAST  TECHNIQUE: Contiguous axial images were obtained from the base of the skull through the vertex without intravenous contrast.  COMPARISON:  03/13/2015  FINDINGS: Age-appropriate significant diffuse atrophy. Moderate to severe low attenuation in the deep periventricular white matter. Proportional prominence of ventricles without evidence of hydrocephalus. No evidence of vascular territory infarct. No evidence of mass. No hemorrhage or extra-axial fluid. Calvarium intact. Visualized portions of the paranasal sinuses are clear.  IMPRESSION: Significant age-related involutional change with no acute findings   Electronically  Signed   By: Esperanza Heir M.D.   On: 03/16/2015 13:32   Ct Head Wo Contrast  03/13/2015   CLINICAL DATA:  Altered mental status. Increased weakness for 3 days.  EXAM: CT HEAD WITHOUT CONTRAST  TECHNIQUE: Contiguous axial images were obtained from the base of the skull through the vertex without intravenous contrast.  COMPARISON:  08/25/2014  FINDINGS: There is atrophy and chronic small vessel disease changes. No acute intracranial abnormality. Specifically, no hemorrhage, hydrocephalus, mass lesion, acute infarction, or significant intracranial injury. No acute calvarial abnormality. Visualized paranasal sinuses and mastoids clear. Orbital soft tissues unremarkable.  IMPRESSION: No acute intracranial abnormality.  Atrophy, chronic microvascular disease.   Electronically Signed   By: Charlett Nose M.D.   On: 03/13/2015 10:30     Microbiology: Recent Results (from the past 240 hour(s))  Urine culture     Status: None   Collection Time: 03/13/15 11:42 AM  Result Value Ref Range Status   Specimen Description URINE, RANDOM  Final   Special Requests NONE  Final   Culture >=100,000 COLONIES/mL AEROCOCCUS URINAE  Final   Report Status 03/16/2015 FINAL  Final  Culture, blood (routine x 2)     Status: None (Preliminary result)   Collection Time: 03/16/15  2:53 PM  Result Value Ref Range Status   Specimen Description BLOOD HAND LEFT  Final   Special Requests BOTTLES DRAWN AEROBIC AND ANAEROBIC 5CC  Final   Culture NO GROWTH < 24 HOURS  Final   Report Status PENDING  Incomplete  Culture, blood (routine x 2)     Status: None (Preliminary result)   Collection Time: 03/16/15  2:56 PM  Result Value Ref Range Status   Specimen Description BLOOD HAND RIGHT  Final   Special Requests BOTTLES DRAWN AEROBIC AND ANAEROBIC 5CC  Final   Culture NO GROWTH < 24 HOURS  Final   Report Status PENDING  Incomplete     Labs: Basic Metabolic Panel:  Recent Labs Lab 03/13/15 1000 03/14/15 0252 03/15/15 0320  03/16/15 0315 03/17/15 0914  NA 143 141 144 142 139  K 3.8 3.2* 3.4* 4.6 3.2*  CL 101 97* 101 96* 95*  CO2 31 35* 36* 35* 35*  GLUCOSE 140* 138* 119* 116* 181*  BUN 21* 21*  CREATININE 0.90 1.05* 0.96 0.93 0.79  CALCIUM 9.1 8.7* 8.6* 8.6* 8.4*   Liver Function Tests:  Recent Labs Lab 03/13/15 1000 03/14/15 0252  AST 32 28  ALT 22 24  ALKPHOS 66 66  BILITOT 0.9 0.7  PROT 6.1* 6.5  ALBUMIN 3.3* 3.2*   No results for input(s): LIPASE, AMYLASE in the last 168 hours. No results for input(s): AMMONIA in the last 168 hours. CBC:  Recent Labs Lab 03/13/15 1000 03/14/15 0252 03/15/15 0320  WBC 7.0 9.9 8.5  NEUTROABS 4.6  --   --   HGB 14.3 14.6 14.6  HCT 45.6 46.7* 47.2*  MCV 87.0 88.6 89.4  PLT 234 233 243   Cardiac Enzymes:  Recent Labs Lab 03/13/15 1000  TROPONINI 0.05*   BNP: Invalid input(s): POCBNP CBG: No results for input(s): GLUCAP in the last 168 hours.  Time coordinating discharge:  Greater than 30 minutes  Signed:  TAT, DAVID, DO [facility] Pager: [phone] 03/18/2015, 8:17 AM

## 2015-03-18 NOTE — Telephone Encounter (Signed)
Left message on voicemail informing patient/family that she will need to schedule a hospital follow-up within 1-2 weeks

## 2015-03-18 NOTE — Progress Notes (Addendum)
CSW has been speaking with [name]'s son [name] throughout the day to discuss bed availability and he has toured 3 facilities. He has chosen [name] for [name] and she defers to him for decision making.  Son will go to facility to sign admit papers this afternoon. Awaiting results of a knee xray due to complaints of knee pain. As long as it is negative- will d/c [name] today via EMS. Nursing notified to call report. Facility aware. Son has discussed with [name] and Shannon father and they are agreeable to d/c plan.  Son translates to [name] per [name] request.   No further CSW needs were identified.  Lorri Frederick. Jaci Lazier, [place] [phone]

## 2015-03-18 NOTE — Progress Notes (Signed)
Called [facility] at [phone], and gave report to [facility].  IV removed.  Telemetry D/C'd.  Pt was in no acute distress at this time.  Family was notified that pt has been discharged.

## 2015-03-19 ENCOUNTER — Encounter: Payer: Self-pay | Admitting: Adult Health

## 2015-03-19 ENCOUNTER — Non-Acute Institutional Stay (SKILLED_NURSING_FACILITY): Payer: Medicare Other | Admitting: Adult Health

## 2015-03-19 DIAGNOSIS — E0590 Thyrotoxicosis, unspecified without thyrotoxic crisis or storm: Secondary | ICD-10-CM | POA: Diagnosis not present

## 2015-03-19 DIAGNOSIS — F329 Major depressive disorder, single episode, unspecified: Secondary | ICD-10-CM

## 2015-03-19 DIAGNOSIS — R5381 Other malaise: Secondary | ICD-10-CM | POA: Diagnosis not present

## 2015-03-19 DIAGNOSIS — M810 Age-related osteoporosis without current pathological fracture: Secondary | ICD-10-CM

## 2015-03-19 DIAGNOSIS — I5022 Chronic systolic (congestive) heart failure: Secondary | ICD-10-CM | POA: Diagnosis not present

## 2015-03-19 DIAGNOSIS — F32A Depression, unspecified: Secondary | ICD-10-CM

## 2015-03-19 DIAGNOSIS — I10 Essential (primary) hypertension: Secondary | ICD-10-CM

## 2015-03-19 DIAGNOSIS — I2510 Atherosclerotic heart disease of native coronary artery without angina pectoris: Secondary | ICD-10-CM | POA: Diagnosis not present

## 2015-03-19 DIAGNOSIS — I480 Paroxysmal atrial fibrillation: Secondary | ICD-10-CM

## 2015-03-19 DIAGNOSIS — E785 Hyperlipidemia, unspecified: Secondary | ICD-10-CM | POA: Diagnosis not present

## 2015-03-19 DIAGNOSIS — N390 Urinary tract infection, site not specified: Secondary | ICD-10-CM | POA: Diagnosis not present

## 2015-03-21 LAB — CULTURE, BLOOD (ROUTINE X 2)
Culture: NO GROWTH
Culture: NO GROWTH

## 2015-03-22 ENCOUNTER — Telehealth: Payer: Self-pay | Admitting: Internal Medicine

## 2015-03-22 ENCOUNTER — Non-Acute Institutional Stay (SKILLED_NURSING_FACILITY): Payer: Medicare Other | Admitting: Internal Medicine

## 2015-03-22 ENCOUNTER — Ambulatory Visit: Payer: Medicare Other | Admitting: Internal Medicine

## 2015-03-22 DIAGNOSIS — E0590 Thyrotoxicosis, unspecified without thyrotoxic crisis or storm: Secondary | ICD-10-CM | POA: Diagnosis not present

## 2015-03-22 DIAGNOSIS — M810 Age-related osteoporosis without current pathological fracture: Secondary | ICD-10-CM

## 2015-03-22 DIAGNOSIS — F32A Depression, unspecified: Secondary | ICD-10-CM

## 2015-03-22 DIAGNOSIS — I4891 Unspecified atrial fibrillation: Secondary | ICD-10-CM | POA: Diagnosis not present

## 2015-03-22 DIAGNOSIS — K219 Gastro-esophageal reflux disease without esophagitis: Secondary | ICD-10-CM | POA: Diagnosis not present

## 2015-03-22 DIAGNOSIS — R42 Dizziness and giddiness: Secondary | ICD-10-CM

## 2015-03-22 DIAGNOSIS — E785 Hyperlipidemia, unspecified: Secondary | ICD-10-CM

## 2015-03-22 DIAGNOSIS — N390 Urinary tract infection, site not specified: Secondary | ICD-10-CM

## 2015-03-22 DIAGNOSIS — R5381 Other malaise: Secondary | ICD-10-CM

## 2015-03-22 DIAGNOSIS — E876 Hypokalemia: Secondary | ICD-10-CM | POA: Diagnosis not present

## 2015-03-22 DIAGNOSIS — F329 Major depressive disorder, single episode, unspecified: Secondary | ICD-10-CM | POA: Diagnosis not present

## 2015-03-22 DIAGNOSIS — I5043 Acute on chronic combined systolic (congestive) and diastolic (congestive) heart failure: Secondary | ICD-10-CM

## 2015-03-22 NOTE — Telephone Encounter (Signed)
New message     Pt was discharged from hosp last thurs.  She is at [facility].  She has an appt already scheduled for today.  Will she still need to come in today or should she be seen a few weeks later?  Please call son

## 2015-03-22 NOTE — Telephone Encounter (Signed)
Left a voicemail message on son's self identified voice mail that [facility] rescheduled patient's appointment from today for this Friday 10/7 at 9:00 am due to they did not have transportation arranged.  Advised him to call them for details on what time she is leaving to come to the appointment.  Also advised him to call back with any further concerns/questions.

## 2015-03-22 NOTE — Progress Notes (Signed)
Patient ID: [name], female   DOB: [birth year], 81 y.o.   MRN: [id number]     [facility]   PCP: [name], NP  Code Status: DNR   Allergies  Allergen Reactions  . Namenda [Memantine Hcl] Other (See Comments)    Dizzy  . Iodine Other (See Comments)    unknown  . Iohexol Hives     Code: HIVES, Desc: hives with nonionic contrast 7 yrs ago ([place] iv benedryl given   . Peanut-Containing Drug Products Other (See Comments)    unknown    Chief Complaint  Patient presents with  . New Admit To SNF     HPI:  81 y.o. patient is here for short term rehabilitation post hospital admission from 03/13/15-03/18/15 with acute systolic chf exacerbation. Echocardiogram showed EF 20-25%. She required iv lasix and was later switched to po lasix. She also had acute encephalopathy in setting of UTI and required antibiotics. She has PMH of CAD s/p RCA stent, afib,CHF, CVA and HTN among others. She is seen in her room today. Her husband is present at bedside. She was working with therapy team and complained of dizziness. She was made to lie down. She complaints of feeling tired. Denies any other concerns.  Review of Systems:  Constitutional: Negative for fever, chills, diaphoresis.  HENT: Negative for headache, congestion, nasal discharge, hearing loss, earache, sore throat, difficulty swallowing.   Eyes: Negative for eye pain, blurred vision, double vision and discharge.  Respiratory: Negative for cough, shortness of breath and wheezing.   Cardiovascular: Negative for chest pain, palpitations, leg swelling.  Gastrointestinal: Negative for heartburn, nausea, vomiting, abdominal pain. Had bowel movement yesterday Genitourinary: Negative for dysuria, flank pain.  Musculoskeletal: Negative for back pain, falls Skin: Negative for itching, rash.  Neurological: Negative for dizziness, tingling, focal weakness Psychiatric/Behavioral: Negative for  depression, anxiety, insomnia and memory loss.    Past Medical History  Diagnosis Date  . Lumbar back pain   . CAD (coronary artery disease)     a. s/p prior RCA stenting;  b. 12/2014 Cath: LM nl, LAD 25p, LCX 20p/m, RCA 25ost, 20d ISR, EF 25-30%.  . Memory loss   . DJD (degenerative joint disease)   . Depression   . Essential hypertension   . CVA (cerebral infarction)   . Chronic systolic CHF (congestive heart failure)     a. 12/2014 Echo: EF 25%, mild LVH, mod MR, sev dil LA, mildly dil RA, PASP .  Marland Kitchen NICM (nonischemic cardiomyopathy)     a. 07/2014 EF 50%;  b. 12/2014 Echo: EF 25%.  . Presence of permanent cardiac pacemaker     a. 10/2009 s/p MDT [id number] Adapta DC PPM, Ser # [id number].  . Osteoporosis   . Hypercholesterolemia   . Hyperthyroidism   . Anxiety   . Vertigo   . Tachy-brady syndrome     a. 10/2009 s/p MDT PPM.  . TIA (transient ischemic attack)   . PSVT (paroxysmal supraventricular tachycardia)     a. 12/2009 while in hospital.   Past Surgical History  Procedure Laterality Date  . Pacemaker insertion  2011  . Total knee arthroplasty Bilateral 2002  . Abdominal hysterectomy  1986  . Bilateral stenting  1999    to the RCA  . Cardiac catheterization  05/23/10  . Total abdominal hysterectomy w/ bilateral salpingoophorectomy    . Cardiac catheterization N/A 01/06/2015    Procedure: Left Heart Cath and Coronary Angiography;  Surgeon: Maisie Fus  Alphonsus Sias, MD;  Location: [facility] INVASIVE CV LAB;  Service: Cardiovascular;  Laterality: N/A;   Social History:   reports that she has never smoked. She has never used smokeless tobacco. She reports that she does not drink alcohol or use illicit drugs.  Family History  Problem Relation Age of Onset  . Thyroid disease Daughter   . Diabetes Other   . Hypertension Other   . Arthritis Other   . Coronary artery disease Mother 51  . Coronary artery disease Father 81  . Heart attack Father   . Heart attack Sister   . Heart attack  Brother   . Hypertension Sister   . Hypertension Brother   . Stroke Brother   . Stroke Sister     Medications:   Medication List       This list is accurate as of: 03/22/15 11:22 AM.  Always use your most recent med list.               alendronate 70 MG tablet  Commonly known as:  FOSAMAX  Take 1 tablet (70 mg total) by mouth once a week. Take with a full glass of water on an empty stomach.     amoxicillin 500 MG capsule  Commonly known as:  AMOXIL  Take 1 capsule (500 mg total) by mouth every 8 (eight) hours.     apixaban 5 MG Tabs tablet  Commonly known as:  ELIQUIS  Take 1 tablet (5 mg total) by mouth 2 (two) times daily.     Calcium Carbonate-Vit D-Min 600-400 MG-UNIT Tabs  Take 1 tablet by mouth 2 (two) times daily.     carvedilol 3.125 MG tablet  Commonly known as:  COREG  Take 1 tablet (3.125 mg total) by mouth 2 (two) times daily with a meal.     CENTRUM SILVER tablet  Take 1 tablet by mouth daily.     furosemide 40 MG tablet  Commonly known as:  LASIX  Take 1 tablet (40 mg total) by mouth 2 (two) times daily.     lisinopril 2.5 MG tablet  Commonly known as:  PRINIVIL,ZESTRIL  TAKE 1 TABLET BY MOUTH EVERY DAY     methimazole 10 MG tablet  Commonly known as:  TAPAZOLE  Take 10 mg by mouth 3 (three) times a week. Monday, Wednesday, and Friday     nitroGLYCERIN 0.4 MG SL tablet  Commonly known as:  NITROSTAT  Place 1 tablet (0.4 mg total) under the tongue every 5 (five) minutes as needed. For chest pain     pantoprazole 40 MG tablet  Commonly known as:  PROTONIX  Take 1 tablet (40 mg total) by mouth daily at 6 (six) AM.     polyethylene glycol packet  Commonly known as:  MIRALAX / GLYCOLAX  Take 17 g by mouth daily.     potassium chloride 10 MEQ tablet  Commonly known as:  K-DUR  Take 1 tablet (10 mEq total) by mouth daily.     sertraline 50 MG tablet  Commonly known as:  ZOLOFT  Take 1 tablet (50 mg total) by mouth daily.     simvastatin 40  MG tablet  Commonly known as:  ZOCOR  TAKE 1 TABLET BY MOUTH EVERY DAY AT 6PM     vitamin C 500 MG tablet  Commonly known as:  ASCORBIC ACID  Take 500 mg by mouth daily.         Physical Exam: Filed Vitals:   03/22/15 1120  BP: 129/78  Pulse: 64  Temp: 97.4 F (36.3 C)  Resp: 18  Weight: 162 lb 3.2 oz (73.573 kg)  SpO2: 97%    General- elderly female, in no acute distress Head- normocephalic, atraumatic Nose- normal nasal mucosa, no maxillary or frontal sinus tenderness, no nasal discharge Throat- moist mucus membrane Eyes- PERRLA, EOMI, no pallor, no icterus, no discharge, normal conjunctiva, normal sclera Neck- no cervical lymphadenopathy, no jugular vein distension Cardiovascular- normal s1,s2, no murmurs, palpable dorsalis pedis and radial pulses, no leg edema Respiratory- bilateral clear to auscultation, no wheeze, no rhonchi, no crackles, no use of accessory muscles Abdomen- bowel sounds present, soft, non tender Musculoskeletal- able to move all 4 extremities, generalized weakness Neurological- no focal deficit, alert and oriented to person only Skin- warm and dry Psychiatry- normal mood and affect    Labs reviewed: Basic Metabolic Panel:  Recent Labs  04/59/13 0632 07/28/14 0838  01/03/15 0353  03/15/15 0320 03/16/15 0315 03/17/15 0914  NA 140 141  < > 139  < > 144 142 139  K 3.9 3.6  < > 3.4*  < > 3.4* 4.6 3.2*  CL 105 105  < > 93*  < > 101 96* 95*  CO2 31 29  < > 38*  < > 36* 35* 35*  GLUCOSE 111* 113*  < > 130*  < > 119* 116* 181*  BUN 17 13  < > 21*  < > 15 21* 21*  CREATININE 0.86 0.81  < > 1.00  < > 0.96 0.93 0.79  CALCIUM 9.5 9.4  < > 8.7*  < > 8.6* 8.6* 8.4*  MG 1.9 1.9  --  2.2  --   --   --   --   < > = values in this interval not displayed. Liver Function Tests:  Recent Labs  08/25/14 1822 03/13/15 1000 03/14/15 0252  AST 16 32 28  ALT 14 22 24   ALKPHOS 47 66 66  BILITOT 0.5 0.9 0.7  PROT 6.2 6.1* 6.5  ALBUMIN 3.0* 3.3* 3.2*      Recent Labs  08/25/14 1822  LIPASE 36   No results for input(s): AMMONIA in the last 8760 hours. CBC:  Recent Labs  08/23/14 1835  12/29/14 2249  03/13/15 1000 03/14/15 0252 03/15/15 0320  WBC 9.8  < > 6.8  < > 7.0 9.9 8.5  NEUTROABS 4.5  --  4.3  --  4.6  --   --   HGB 13.3  < > 13.4  < > 14.3 14.6 14.6  HCT 40.5  < > 42.2  < > 45.6 46.7* 47.2*  MCV 90.0  < > 86.1  < > 87.0 88.6 89.4  PLT 300  < > 218  < > 234 233 243  < > = values in this interval not displayed. Cardiac Enzymes:  Recent Labs  12/30/14 1925 12/31/14 0318 03/13/15 1000  TROPONINI 0.07* 0.08* 0.05*   BNP: Invalid input(s): POCBNP CBG:  Recent Labs  01/06/15 2109 01/07/15 0638 01/07/15 1032  GLUCAP 170* 119* 132*    Radiological Exams: Dg Chest 2 View  03/13/2015   CLINICAL DATA:  Altered mental status  EXAM: CHEST  2 VIEW  COMPARISON:  12/29/2014  FINDINGS: There is cardiomegaly. Left pacer in stable position. Elevation of the right hemidiaphragm. Minimal bibasilar atelectasis. No overt edema or effusions.  IMPRESSION: Cardiomegaly. Bibasilar atelectasis with elevation of the right hemidiaphragm   Electronically Signed   By: Charlett Nose M.D.  On: 03/13/2015 10:49   Ct Head Wo Contrast  03/13/2015   CLINICAL DATA:  Altered mental status. Increased weakness for 3 days.  EXAM: CT HEAD WITHOUT CONTRAST  TECHNIQUE: Contiguous axial images were obtained from the base of the skull through the vertex without intravenous contrast.  COMPARISON:  08/25/2014  FINDINGS: There is atrophy and chronic small vessel disease changes. No acute intracranial abnormality. Specifically, no hemorrhage, hydrocephalus, mass lesion, acute infarction, or significant intracranial injury. No acute calvarial abnormality. Visualized paranasal sinuses and mastoids clear. Orbital soft tissues unremarkable.  IMPRESSION: No acute intracranial abnormality.  Atrophy, chronic microvascular disease.   Electronically Signed   By: Charlett Nose M.D.   On: 03/13/2015 10:30     Assessment/Plan  Physical deconditioning Will have patient work with PT/OT as tolerated to regain strength and restore function.  Fall precautions are in place.  UTI Completed her amoxicillin, currently asymptomatic, monitor clinically  CHF Recent exacerbation. Continue coreg 3.125 mg bid, lasix 40 mg bid, lisinopril 2.5 mg bid and kcl supplement. Monitor weight and bmp  Dizziness Will check her orthostatics. Check cbc to rule out anemia  afib Stable, continue eliquis 5 mg bid with coreg 3.125 mg bid  Hyperthyroidism Continue methimazole 10 mg three times a week  gerd Stable symptom with protonix 40 mg daily  Constipation Continue miralax daily  Chronic depression' Continue zoloft 50 mg daily and monitor  HLD Continue zocor 40 mg daily  Osteoporosis Continue weekly fosamax and calcium-vitamin d  Hypokalemia Continue kcl 10 meq daily, check bmp   Goals of care: short term rehabilitation   Labs/tests ordered: cbc, cmp, orthostatics  Family/ staff Communication: reviewed care plan with patient and nursing supervisor    Oneal Grout, MD  [facility] [phone] (Monday-Friday 8 am - 5 pm) [phone] (afterhours)

## 2015-03-23 ENCOUNTER — Encounter: Payer: Self-pay | Admitting: *Deleted

## 2015-03-24 LAB — CBC AND DIFFERENTIAL
HEMATOCRIT: 48 % — AB (ref 36–46)
Hemoglobin: 14.9 g/dL (ref 12.0–16.0)
PLATELETS: 280 10*3/uL (ref 150–399)
WBC: 8.7 10^3/mL

## 2015-03-24 LAB — BASIC METABOLIC PANEL
BUN: 25 mg/dL — AB (ref 4–21)
CREATININE: 0.8 mg/dL (ref 0.5–1.1)
GLUCOSE: 122 mg/dL
POTASSIUM: 4 mmol/L (ref 3.4–5.3)
SODIUM: 156 mmol/L — AB (ref 137–147)

## 2015-03-26 ENCOUNTER — Encounter: Payer: Self-pay | Admitting: Internal Medicine

## 2015-03-26 ENCOUNTER — Ambulatory Visit (INDEPENDENT_AMBULATORY_CARE_PROVIDER_SITE_OTHER): Payer: Medicare Other | Admitting: Internal Medicine

## 2015-03-26 VITALS — BP 128/90 | HR 70 | Ht 60.0 in | Wt 178.0 lb

## 2015-03-26 DIAGNOSIS — I2510 Atherosclerotic heart disease of native coronary artery without angina pectoris: Secondary | ICD-10-CM | POA: Diagnosis not present

## 2015-03-26 DIAGNOSIS — I10 Essential (primary) hypertension: Secondary | ICD-10-CM

## 2015-03-26 DIAGNOSIS — I639 Cerebral infarction, unspecified: Secondary | ICD-10-CM

## 2015-03-26 DIAGNOSIS — I495 Sick sinus syndrome: Secondary | ICD-10-CM

## 2015-03-26 DIAGNOSIS — E7800 Pure hypercholesterolemia, unspecified: Secondary | ICD-10-CM | POA: Diagnosis not present

## 2015-03-26 DIAGNOSIS — I480 Paroxysmal atrial fibrillation: Secondary | ICD-10-CM | POA: Diagnosis not present

## 2015-03-26 DIAGNOSIS — I5042 Chronic combined systolic (congestive) and diastolic (congestive) heart failure: Secondary | ICD-10-CM

## 2015-03-26 DIAGNOSIS — I471 Supraventricular tachycardia, unspecified: Secondary | ICD-10-CM

## 2015-03-26 DIAGNOSIS — Z950 Presence of cardiac pacemaker: Secondary | ICD-10-CM

## 2015-03-26 LAB — CBC WITH DIFFERENTIAL/PLATELET
BASOS PCT: 0 % (ref 0–1)
Basophils Absolute: 0 10*3/uL (ref 0.0–0.1)
EOS ABS: 0.2 10*3/uL (ref 0.0–0.7)
Eosinophils Relative: 2 % (ref 0–5)
HCT: 45.2 % (ref 36.0–46.0)
HEMOGLOBIN: 14.6 g/dL (ref 12.0–15.0)
Lymphocytes Relative: 34 % (ref 12–46)
Lymphs Abs: 3.2 10*3/uL (ref 0.7–4.0)
MCH: 27.6 pg (ref 26.0–34.0)
MCHC: 32.3 g/dL (ref 30.0–36.0)
MCV: 85.4 fL (ref 78.0–100.0)
MPV: 10 fL (ref 8.6–12.4)
Monocytes Absolute: 0.8 10*3/uL (ref 0.1–1.0)
Monocytes Relative: 9 % (ref 3–12)
NEUTROS ABS: 5.1 10*3/uL (ref 1.7–7.7)
NEUTROS PCT: 55 % (ref 43–77)
PLATELETS: 320 10*3/uL (ref 150–400)
RBC: 5.29 MIL/uL — AB (ref 3.87–5.11)
RDW: 19.2 % — ABNORMAL HIGH (ref 11.5–15.5)
WBC: 9.3 10*3/uL (ref 4.0–10.5)

## 2015-03-26 LAB — BASIC METABOLIC PANEL
BUN: 19 mg/dL (ref 4–21)
BUN: 21 mg/dL (ref 7–25)
CALCIUM: 8.8 mg/dL (ref 8.6–10.4)
CO2: 31 mmol/L (ref 20–31)
CREATININE: 0.8 mg/dL (ref 0.5–1.1)
CREATININE: 0.68 mg/dL (ref 0.60–0.88)
Chloride: 100 mmol/L (ref 98–110)
Glucose, Bld: 96 mg/dL (ref 65–99)
Glucose: 154 mg/dL
Potassium: 3.3 mmol/L — ABNORMAL LOW (ref 3.5–5.3)
Potassium: 3.5 mmol/L (ref 3.4–5.3)
SODIUM: 141 mmol/L (ref 135–146)
Sodium: 141 mmol/L (ref 137–147)

## 2015-03-26 NOTE — Progress Notes (Addendum)
Cardiology Office Note   Date:  03/26/2015   ID:  [name], DOB Mar 07, 1932, MRN [id number]  PCP:  [name], NP  Cardiologist:   Dietrich Pates, MD   Chief Complaint  Patient presents with  . Congestive Heart Failure      History of Present Illness: [name] is a 80 y.o. female with a history of CAD and paroxysmal atrial fib.  Also a history of systolic CHF Echo showed LV EF of 25%. Catheterization was subsequent performed revealing nonobstructive coronary artery disease with patent RCA stent. She has been medically managed with beta blocker and ACE inhibitor therapy was also been placed back on oral Lasix.  Pt called last week with increased weakiness, increased SOB  She was admitted on 9/24 for CHF exacerbation  Repeat echo LVEF 25%  Today she is here alone  She denies CP  Breathing is OK  Legs OK  Answers in broken English  Current Outpatient Prescriptions  Medication Sig Dispense Refill  . alendronate (FOSAMAX) 70 MG tablet Take 1 tablet (70 mg total) by mouth once a week. Take with a full glass of water on an empty stomach. (Patient taking differently: Take 70 mg by mouth once a week. Take on Friday with a full glass of water on an empty stomach.) 4 tablet 5  . apixaban (ELIQUIS) 5 MG TABS tablet Take 1 tablet (5 mg total) by mouth 2 (two) times daily. 60 tablet 11  . Ascorbic Acid (VITAMIN C) 500 MG tablet Take 500 mg by mouth daily.      . Calcium Carbonate-Vit D-Min 600-400 MG-UNIT TABS Take 1 tablet by mouth 2 (two) times daily.     . carvedilol (COREG) 3.125 MG tablet Take 1 tablet (3.125 mg total) by mouth 2 (two) times daily with a meal.    . furosemide (LASIX) 40 MG tablet Take 1 tablet (40 mg total) by mouth 2 (two) times daily. 60 tablet 6  . lisinopril (PRINIVIL,ZESTRIL) 2.5 MG tablet TAKE 1 TABLET BY MOUTH EVERY DAY (Patient taking differently: TAKE 1 TABLET BY MOUTH TWICE DAILY) 30 tablet 5  . methimazole (TAPAZOLE) 10 MG tablet Take 10 mg by  mouth 3 (three) times a week. Monday, Wednesday, and Friday  0  . Multiple Vitamins-Minerals (CENTRUM SILVER) tablet Take 1 tablet by mouth daily.      . nitroGLYCERIN (NITROSTAT) 0.4 MG SL tablet Place 1 tablet (0.4 mg total) under the tongue every 5 (five) minutes as needed. For chest pain 30 tablet 0  . pantoprazole (PROTONIX) 40 MG tablet Take 1 tablet (40 mg total) by mouth daily at 6 (six) AM. 30 tablet 11  . polyethylene glycol (MIRALAX / GLYCOLAX) packet Take 17 g by mouth daily.     . potassium chloride (K-DUR) 10 MEQ tablet Take 1 tablet (10 mEq total) by mouth daily. 30 tablet 1  . sertraline (ZOLOFT) 50 MG tablet Take 1 tablet (50 mg total) by mouth daily. 30 tablet 5  . simvastatin (ZOCOR) 40 MG tablet TAKE 1 TABLET BY MOUTH EVERY DAY AT 6PM 30 tablet 5   No current facility-administered medications for this visit.    Allergies:   Namenda; Iodine; Iohexol; and Peanut-containing drug products   Past Medical History  Diagnosis Date  . Lumbar back pain   . CAD (coronary artery disease)     a. s/p prior RCA stenting;  b. 12/2014 Cath: LM nl, LAD 25p, LCX 20p/m, RCA 25ost, 20d ISR, EF 25-30%.  Marland Kitchen  Memory loss   . DJD (degenerative joint disease)   . Depression   . Essential hypertension   . CVA (cerebral infarction)   . Chronic systolic CHF (congestive heart failure) (HCC)     a. 12/2014 Echo: EF 25%, mild LVH, mod MR, sev dil LA, mildly dil RA, PASP .  Marland Kitchen NICM (nonischemic cardiomyopathy) (HCC)     a. 07/2014 EF 50%;  b. 12/2014 Echo: EF 25%.  . Presence of permanent cardiac pacemaker     a. 10/2009 s/p MDT [id number] Adapta DC PPM, Ser # [id number] H.  . Osteoporosis   . Hypercholesterolemia   . Hyperthyroidism   . Anxiety   . Vertigo   . Tachy-brady syndrome (HCC)     a. 10/2009 s/p MDT PPM.  . TIA (transient ischemic attack)   . PSVT (paroxysmal supraventricular tachycardia) (HCC)     a. 12/2009 while in hospital.    Past Surgical History  Procedure Laterality Date  .  Pacemaker insertion  2011  . Total knee arthroplasty Bilateral 2002  . Abdominal hysterectomy  1986  . Bilateral stenting  1999    to the RCA  . Cardiac catheterization  05/23/10  . Total abdominal hysterectomy w/ bilateral salpingoophorectomy    . Cardiac catheterization N/A 01/06/2015    Procedure: Left Heart Cath and Coronary Angiography;  Surgeon: Lennette Bihari, MD;  Location: [facility] INVASIVE CV LAB;  Service: Cardiovascular;  Laterality: N/A;     Social History:  The patient  reports that she has never smoked. She has never used smokeless tobacco. She reports that she does not drink alcohol or use illicit drugs.   Family History:  The patient's family history includes Arthritis in her other; Coronary artery disease (age of onset: 61) in her father; Coronary artery disease (age of onset: 18) in her mother; Diabetes in her other; Heart attack in her brother, father, and sister; Hypertension in her brother, other, and sister; Stroke in her brother and sister; Thyroid disease in her daughter.    ROS:  Please see the history of present illness. All other systems are reviewed and  Negative to the above problem except as noted.    PHYSICAL EXAM: VS:  BP 128/90 mmHg  Pulse 70  Ht 5' (1.524 m)  Wt 178 lb (80.74 kg)  BMI 34.76 kg/m2  GEN: Pt  in no acute distressExamined in chair. HEENT: normal Neck: no JVD, carotid bruits, or masses Cardiac: RRR; no murmurs, rubs, or gallops,Tr edema  Respiratory:  clear to auscultation bilaterally, normal work of breathing GI: soft, nontender, nondistended, + BS  No hepatomegaly  MS: no deformity Moving all extremities   Skin: warm and dry, no rash   EKG:  EKG is not ordered today.   Lipid Panel    Component Value Date/Time   CHOL 166 08/24/2014 0705   CHOL 165 07/14/2014 1559   TRIG 190* 08/24/2014 0705   TRIG 129 10/06/2009   HDL 45 08/24/2014 0705   HDL 72 07/14/2014 1559   CHOLHDL 3.7 08/24/2014 0705   CHOLHDL 2.3 07/14/2014 1559   VLDL  38 08/24/2014 0705   LDLCALC 83 08/24/2014 0705   LDLCALC 59 07/14/2014 1559      Wt Readings from Last 3 Encounters:  03/26/15 178 lb (80.74 kg)  03/22/15 162 lb 3.2 oz (73.573 kg)  03/18/15 162 lb 3.8 oz (73.592 kg)      ASSESSMENT AND PLAN:  1.  Chronic systolic CHF  Volume status looks pretty good  I would keep on same regimen and check BMET  2.  Hx CAD  No symptoms to sugg angina  3.  PAF  Continue on Eliquis    F/U in a few months  Sooner if changes   Signed, Dietrich Pates, MD  03/26/2015 9:34 AM    [facility] [address] [place], [place], [place]  [address] Phone: [phone]; Fax: [phone]

## 2015-03-26 NOTE — Patient Instructions (Signed)
Medication Instructions:  Your physician recommends that you continue on your current medications as directed. Please refer to the Current Medication list given to you today.   Labwork: TODAY:  CBC W/DIFF                 BMET                 BNP                 BMP  Testing/Procedures: None ordered  Follow-Up: Your physician wants you to follow-up in: 4 MONTHS WITH DR. Tenny Craw.  You will receive a reminder letter in the mail two months in advance. If you don't receive a letter, please call our office to schedule the follow-up appointment.   Any Other Special Instructions Will Be Listed Below (If Applicable). Fluid Restriction Some health conditions may require you to restrict your fluid intake. This means that you need to limit the amount of fluid you drink each day. When you have a fluid restriction, you must carefully measure and keep track of the amount of fluid you drink. Your health care provider will identify the specific amount of fluid you are allowed each day. This amount may depend on several things, such as:  The amount of urine you produce in a day.  How much fluid you are keeping (retaining) in your body.  Your blood pressure. WHAT IS MY PLAN? Your health care provider recommends that you limit your fluid intake to 1.5 - 1.8 LITERS PER DAY MAXIMUM.   WHAT COUNTS TOWARD MY FLUID INTAKE? Your fluid intake includes all liquids that you drink, as well as any foods that become liquid at room temperature.  The following are examples of some fluids that you will have to restrict:  Tea, coffee, soda, lemonade, milk, water, juice, sport drinks, and nutritional supplement beverages.  Alcoholic beverages.  Cream.  Gravy.  Ice cubes.  Soup and broth. The following are examples of foods that become liquid at room temperature. These foods will also count toward your fluid intake.  Ice cream and ice milk.  Frozen yogurt and sherbet.  Frozen ice pops.  Flavored  gelatin. HOW DO I KEEP TRACK OF MY FLUID INTAKE? Each morning, fill a jug with the amount of water that equals the amount of fluid you are allowed for the day. You can use this water as a guideline for fluid allowance. Each time you take in any form of fluid, including ice cubes and foods that become liquid at room temperature, pour an equal amount of water out of the container. This helps you to see how much fluid you are taking in. It also helps you to see how much of your fluid intake is left for the rest of the day. The following conversions may also be helpful in measuring your fluid intake:  1 cup equals 8 oz (240 mL).   cup equals 6 oz (180 mL).   cup equals 5 oz (160 mL).   cup equals 4 oz (120 mL).   cup equals 2 oz (80 mL).   cup equals 2 oz (60 mL).  2 Tbsp equals 1 oz (30 mL). WHAT HOME CARE INSTRUCTIONS SHOULD I FOLLOW WHILE RESTRICTING FLUIDS?  Make sure that you stay within the recommended limit each day. Always measure and keep track of your fluids, as well as any foods that turn liquid at room temperature.  Use small cups and glasses and learn to sip fluids slowly.  Add a slice of fresh lemon or lemon juice to water or ice. This helps to satisfy your thirst.  Freeze fruit juice or water in an ice cube tray. Use this as part of your fluid allowance. These cubes are useful for quenching your thirst. Measure the amount of liquid in each ice cube prior to freezing so you can subtract this amount from your day's allowance when you consume each frozen cube.  Try frozen fruits between meals, such as grapes or strawberries.  Swallow your pills along with meals or soft foods, such as applesauce or mashed potatoes. This helps you to save your fluid allowance for something that you enjoy.  Weigh yourself every day. Keeping track of your daily weight can help you and your health care provider to notice as soon as possible if you are retaining too much fluid in your  body.  Weigh yourself every morning after you urinate but before you eat breakfast.  Wear the same amount of clothing each time you weigh yourself.  Write down your daily weight. Give this weight record to your health care provider. If your weight is going up, you may be retaining too much fluid. Every 2 cups (480 mL) of fluid retained in the body becomes an extra 1 lb (0.45 kg) of body weight.  Avoid salty foods. These foods make you thirsty and make fluid control more difficult.  Brush your teeth often or rinse your mouth with mouthwash to help your dry mouth. Lemon wedges, hard sour candies, chewing gum, or breath spray may also help to moisten your mouth.  Keep the temperature in your home at a cooler level. Dry air increases thirst, so keep the air in your home as humid as possible.  Avoid being out in the hot sun, which can cause you to sweat and become thirsty. WHAT ARE SOME SIGNS THAT I MAY BE TAKING IN TOO MUCH FLUID? You may be taking in too much fluid if:  Your weight increases. Contact your health care provider if your weight increases 3 lb or more in a day or if it increases 5 lb or more in a week.  Your face, hands, legs, feet, and belly (abdomen) start to swell.  You have trouble breathing.   This information is not intended to replace advice given to you by your health care provider. Make sure you discuss any questions you have with your health care provider.   Document Released: 04/02/2007 Document Revised: 06/26/2014 Document Reviewed: 11/04/2013 Elsevier Interactive Patient Education 2016 Elsevier Inc.   Low-Sodium Eating Plan Sodium raises blood pressure and causes water to be held in the body. Getting less sodium from food will help lower your blood pressure, reduce any swelling, and protect your heart, liver, and kidneys. We get sodium by adding salt (sodium chloride) to food. Most of our sodium comes from canned, boxed, and frozen foods. Restaurant foods, fast  foods, and pizza are also very high in sodium. Even if you take medicine to lower your blood pressure or to reduce fluid in your body, getting less sodium from your food is important. WHAT IS MY PLAN? Most people should limit their sodium intake to 2,300 mg a day. Your health care provider recommends that you limit your sodium intake to 2 GRAMS OF SALT a day.  WHAT DO I NEED TO KNOW ABOUT THIS EATING PLAN? For the low-sodium eating plan, you will follow these general guidelines:  Choose foods with a % Daily Value for sodium of less  than 5% (as listed on the food label).   Use salt-free seasonings or herbs instead of table salt or sea salt.   Check with your health care provider or pharmacist before using salt substitutes.   Eat fresh foods.  Eat more vegetables and fruits.  Limit canned vegetables. If you do use them, rinse them well to decrease the sodium.   Limit cheese to 1 oz (28 g) per day.   Eat lower-sodium products, often labeled as "lower sodium" or "no salt added."  Avoid foods that contain monosodium glutamate (MSG). MSG is sometimes added to [place] food and some canned foods.  Check food labels (Nutrition Facts labels) on foods to learn how much sodium is in one serving.  Eat more home-cooked food and less restaurant, buffet, and fast food.  When eating at a restaurant, ask that your food be prepared with less salt, or no salt if possible.  HOW DO I READ FOOD LABELS FOR SODIUM INFORMATION? The Nutrition Facts label lists the amount of sodium in one serving of the food. If you eat more than one serving, you must multiply the listed amount of sodium by the number of servings. Food labels may also identify foods as:  Sodium free--Less than 5 mg in a serving.  Very low sodium--35 mg or less in a serving.  Low sodium--140 mg or less in a serving.  Light in sodium--50% less sodium in a serving. For example, if a food that usually has 300 mg of sodium is changed  to become light in sodium, it will have 150 mg of sodium.  Reduced sodium--25% less sodium in a serving. For example, if a food that usually has 400 mg of sodium is changed to reduced sodium, it will have 300 mg of sodium. WHAT FOODS CAN I EAT? Grains Low-sodium cereals, including oats, puffed wheat and rice, and shredded wheat cereals. Low-sodium crackers. Unsalted rice and pasta. Lower-sodium bread.  Vegetables Frozen or fresh vegetables. Low-sodium or reduced-sodium canned vegetables. Low-sodium or reduced-sodium tomato sauce and paste. Low-sodium or reduced-sodium tomato and vegetable juices.  Fruits Fresh, frozen, and canned fruit. Fruit juice.  Meat and Other Protein Products Low-sodium canned tuna and salmon. Fresh or frozen meat, poultry, seafood, and fish. Lamb. Unsalted nuts. Dried beans, peas, and lentils without added salt. Unsalted canned beans. Homemade soups without salt. Eggs.  Dairy Milk. Soy milk. Ricotta cheese. Low-sodium or reduced-sodium cheeses. Yogurt.  Condiments Fresh and dried herbs and spices. Salt-free seasonings. Onion and garlic powders. Low-sodium varieties of mustard and ketchup. Fresh or refrigerated horseradish. Lemon juice.  Fats and Oils Reduced-sodium salad dressings. Unsalted butter.  Other Unsalted popcorn and pretzels.  The items listed above may not be a complete list of recommended foods or beverages. Contact your dietitian for more options. WHAT FOODS ARE NOT RECOMMENDED? Grains Instant hot cereals. Bread stuffing, pancake, and biscuit mixes. Croutons. Seasoned rice or pasta mixes. Noodle soup cups. Boxed or frozen macaroni and cheese. Self-rising flour. Regular salted crackers. Vegetables Regular canned vegetables. Regular canned tomato sauce and paste. Regular tomato and vegetable juices. Frozen vegetables in sauces. Salted [place] fries. Olives. Rosita Fire. Relishes. Sauerkraut. Salsa. Meat and Other Protein Products Salted, canned,  smoked, spiced, or pickled meats, seafood, or fish. Bacon, ham, sausage, hot dogs, corned beef, chipped beef, and packaged luncheon meats. Salt pork. Jerky. Pickled herring. Anchovies, regular canned tuna, and sardines. Salted nuts. Dairy Processed cheese and cheese spreads. Cheese curds. Blue cheese and cottage cheese. Buttermilk.  Condiments Onion and  garlic salt, seasoned salt, table salt, and sea salt. Canned and packaged gravies. Worcestershire sauce. Tartar sauce. Barbecue sauce. Teriyaki sauce. Soy sauce, including reduced sodium. Steak sauce. Fish sauce. Oyster sauce. Cocktail sauce. Horseradish that you find on the shelf. Regular ketchup and mustard. Meat flavorings and tenderizers. Bouillon cubes. Hot sauce. Tabasco sauce. Marinades. Taco seasonings. Relishes. Fats and Oils Regular salad dressings. Salted butter. Margarine. Ghee. Bacon fat.  Other Potato and tortilla chips. Corn chips and puffs. Salted popcorn and pretzels. Canned or dried soups. Pizza. Frozen entrees and pot pies.  The items listed above may not be a complete list of foods and beverages to avoid. Contact your dietitian for more information.   This information is not intended to replace advice given to you by your health care provider. Make sure you discuss any questions you have with your health care provider.   Document Released: 11/25/2001 Document Revised: 06/26/2014 Document Reviewed: 04/09/2013 Elsevier Interactive Patient Education [employer]

## 2015-03-27 LAB — BRAIN NATRIURETIC PEPTIDE: BRAIN NATRIURETIC PEPTIDE: 428.1 pg/mL — AB (ref 0.0–100.0)

## 2015-03-29 LAB — BASIC METABOLIC PANEL
BUN: 19 mg/dL (ref 4–21)
CREATININE: 0.8 mg/dL (ref 0.5–1.1)
GLUCOSE: 122 mg/dL
POTASSIUM: 3.8 mmol/L (ref 3.4–5.3)
Sodium: 145 mmol/L (ref 137–147)

## 2015-03-30 ENCOUNTER — Encounter: Payer: Self-pay | Admitting: Neurology

## 2015-03-30 ENCOUNTER — Ambulatory Visit (INDEPENDENT_AMBULATORY_CARE_PROVIDER_SITE_OTHER): Payer: Medicare Other | Admitting: Neurology

## 2015-03-30 VITALS — BP 108/77 | HR 66 | Ht 61.0 in

## 2015-03-30 DIAGNOSIS — I639 Cerebral infarction, unspecified: Secondary | ICD-10-CM | POA: Diagnosis not present

## 2015-03-30 DIAGNOSIS — I4891 Unspecified atrial fibrillation: Secondary | ICD-10-CM | POA: Diagnosis not present

## 2015-03-30 NOTE — Patient Instructions (Signed)
I had a long discussion with the patient using her [place] language interpreter over the phone and she seems to be stable from neurovascular standpoint without recurrent stroke or TIA symptoms. Continue eliquis for secondary stroke prevention given history of atrial fibrillation. Continue physical and occupational therapy and ambulate with a walker with assistance. I suspect patient will need long-term nursing home placement. I will try to speak to the patient's son over the phone. No scheduled follow-up appointment is necessary but may be referred back to Dr. Roda Shutters in the future as necessary.

## 2015-03-30 NOTE — Progress Notes (Signed)
STROKE NEUROLOGY FOLLOW UP NOTE  NAME: [name] DOB: 03-Jul-1931  REASON FOR VISIT: stroke follow up HISTORY FROM: chart and  [place] language telephone interpreter  Today we had the pleasure of seeing [name] in follow-up at our Neurology Clinic. Pt was accompanied by son, who also served as interpreter.  History Summary [name] is a 81 y.o. female with history of stroke in the past, chronic diastolic CHF, hyperthyroidism, sick sinus syndrome status post pacemaker placement and A. fib not on anticoagulation due to fall risk, CAD, and dyslipidemia was brought to the emergency department on 08/23/14 for evaluation of left-sided weakness and left facial droop. CT head imaging reviewed and overall unremarkable. Patient was not administered TPA secondary to resolution of symptoms. After admission, repeat CT no acute abnormalities. Stroke work up negative including 2D echo and CUS. LDL 83 on zocor  and A1C 6.2. Due to afib, she was put on eliquis. Continued on zocor. Pt was discharged in good condition.   She was admitted in 07/2014 for dizziness, presumebly due to orthostatic hypotension with dehydration and diuretic use. Significantly improved with IV fluids. CT head on 2/6 and on 2/7 negative. Seen by physical therapy, recommendations are discharged to SNF. Orthostatic vital signs have resolved by the day of discharge.    11/18/14 Dr Roda Shutters During the interval time, the patient has been doing fair.  Still not active on exercise at home. Not motivated for rehabilitation exercises. Patient stated that she feels depressed due to stroke, and she does not feel well to work with PT OT. Son stated that patient is upset about her daughter in new work not able to come down to [place] to help out. Update 03/30/15 : She returns for follow-up after last visit with Dr. Roda Shutters 4 months ago. She is unfortunately not accompanied by her son today and not able to provide much history. I used [place]  language interpreter over the phone for this visit. The patient denies any new symptoms of stroke or TIAs. However she has been admitted to the hospital in the interim with CHF exacerbations., UTI and encephalopathy. I have reviewed hospital discharge summary and imaging studies studies which did not show any acute stroke. She is currently back in [facility] nursing facility. She is able to ambulate with one person assist using a walker. She is on eliquis and tolerating it well without any bleeding complications. She spends most of the time in a wheelchair. She wants to go home but is clearly not safe to go home yet. REVIEW OF SYSTEMS: Full 14 system review of systems performed and notable only for those listed below and in HPI above, all others are negative:   Gait difficulty, weakness The following represents the patient's updated allergies and side effects list: Allergies  Allergen Reactions  . Namenda [Memantine Hcl] Other (See Comments)    Dizzy  . Iodine Other (See Comments)    unknown  . Iohexol Hives     Code: HIVES, Desc: hives with nonionic contrast 7 yrs ago ([place] iv benedryl given   . Peanut-Containing Drug Products Other (See Comments)    unknown    The neurologically relevant items on the patient's problem list were reviewed on today's visit.  Neurologic Examination  A problem focused neurological exam (12 or more points of the single system neurologic examination, vital signs counts as 1 point, cranial nerves count for 8 points) was performed.  Blood pressure 108/77, pulse 66, height  (  1.549 m).  General - Well nourished, well developed, in no apparent distress.  Ophthalmologic - Fundi not visualized due to noncooperation.  Cardiovascular - Regular rate and rhythm.  Mental Status - exam limited due to using interpreter Level of arousal and orientation to place, and person were intact, however not orientated to time. Language including expression, naming,  repetition, comprehension was difficult to due to language barrier but seems grossly intact.  Cranial Nerves II - XII - II - blinking to visual threat bilaterally. III, IV, VI - eyes attending to both directions. V - Facial sensation intact bilaterally. VII -  left lower facial weakness. VIII - Hearing & vestibular intact bilaterally. X - Palate elevates symmetrically. XI - Chin turning & shoulder shrug intact bilaterally. XII - Tongue protrusion intact.  Motor Strength - The patient's strength was 4/5 LUE proximal and distal, decreased dexterity of left hand and pronator drift was present on the left. BLE 4/5 proximal and distal, RUE 5/5. Bulk was normal and fasciculations were absent.   Motor Tone - Muscle tone was assessed at the neck and appendages and was normal.  Reflexes - The patient's reflexes were 1+ in all extremities and she had no pathological reflexes.  Sensory - Light touch, temperature/pinprick were assessed and were normal.    Coordination - The patient had normal movements in the right but slow on the left hand on FTN with dysmetria.  Tremor was absent.  Gait and Station - not tested, wheelchair-bound.  Data reviewed: I personally reviewed the images and agree with the radiology interpretations.  2D Echocardiogram EF 50% with no source of embolus.   Carotid Doppler There is 1-39% bilateral ICA stenosis. Vertebral artery flow is antegrade.   CT head - Atrophy with small vessel chronic ischemic changes of deep cerebralwhite matter. Old LEFT cerebellar and question old LEFT caudate infarct. No acute intracranial abnormalities  Component     Latest Ref Rng 08/24/2014  Cholesterol     0 - 200 mg/dL 080  Triglycerides     <150 mg/dL 223 (H)  HDL Cholesterol     >39 mg/dL 45  Total CHOL/HDL Ratio      3.7  VLDL     0 - 40 mg/dL 38  LDL (calc)     0 - 99 mg/dL 83  Hemoglobin V6P     4.8 - 5.6 % 6.2 (H)  Mean Plasma Glucose      131    Assessment: As  you may recall, she is a 81 y.o. [place] female with PMH of stroke in the past, hyperthyroidism, chronic diastolic CHF, sick sinus syndrome status post pacemaker placement and A. fib not on anticoagulation due to fall risk, CAD, and dyslipidemia was brought to the emergency department on 08/23/14 for TIA episode. Patient was not administered TPA secondary to resolution of symptoms. Repeat CT no acute abnormalities. Stroke work up negative including 2D echo and CUS. LDL 83 on zocor 40mg  and A1C 6.2. Due to afib, she was put on eliquis. Continued on zocor. After discharge, patient was sent to SNF and currently back in  SNFdue to recurrent admissions for CHF , UTI and encephalopathy .  Plan:  -I had a long discussion with the patient using her [place] language interpreter over the phone and she seems to be stable from neurovascular standpoint without recurrent stroke or TIA symptoms. Continue eliquis for secondary stroke prevention given history of atrial fibrillation. Maintain strict control of hypertension with blood pressure goal below 130/90  and lipids with LDL cholesterol goal below 70 mg percent. Continue physical and occupational therapy and ambulate with a walker with assistance. I suspect patient will need long-term nursing home placement. I will try to speak to the patient's son over the phone. I called and left a message on his cell phone to call me back. No scheduled follow-up appointment is necessary but may be referred back to Dr. Roda Shutters in the future as necessary.  No orders of the defined types were placed in this encounter.    No orders of the defined types were placed in this encounter.      I     Delia Heady, MD [facility] [address], [address] [place], [place] [address] [phone]

## 2015-03-30 NOTE — Progress Notes (Unsigned)
Rn and Md use the interpreter line for [place] language.

## 2015-04-01 ENCOUNTER — Telehealth: Payer: Self-pay

## 2015-04-01 NOTE — Telephone Encounter (Signed)
Rn call [facility] at [phone] about patient left her DNR form with MD. Rn got the correct mailing address for [facility]  [address] [place] [address]. Rn talk to [employer] at facility and she stated it put it to her attention. DNR form mail back.

## 2015-04-14 LAB — LIPID PANEL
Cholesterol: 172 mg/dL (ref 0–200)
HDL: 52 mg/dL (ref 35–70)
LDL CALC: 100 mg/dL
Triglycerides: 102 mg/dL (ref 40–160)

## 2015-04-14 LAB — TSH: TSH: 0.67 u[IU]/mL (ref 0.41–5.90)

## 2015-04-20 ENCOUNTER — Encounter: Payer: Self-pay | Admitting: *Deleted

## 2015-04-26 ENCOUNTER — Other Ambulatory Visit: Payer: 59

## 2015-04-29 ENCOUNTER — Ambulatory Visit: Payer: 59 | Admitting: Nurse Practitioner

## 2015-05-17 ENCOUNTER — Encounter: Payer: Self-pay | Admitting: *Deleted

## 2015-06-25 ENCOUNTER — Non-Acute Institutional Stay (SKILLED_NURSING_FACILITY): Payer: Medicare Other | Admitting: Adult Health

## 2015-06-25 DIAGNOSIS — M810 Age-related osteoporosis without current pathological fracture: Secondary | ICD-10-CM | POA: Diagnosis not present

## 2015-06-25 DIAGNOSIS — I480 Paroxysmal atrial fibrillation: Secondary | ICD-10-CM

## 2015-06-25 DIAGNOSIS — K5900 Constipation, unspecified: Secondary | ICD-10-CM

## 2015-06-25 DIAGNOSIS — I10 Essential (primary) hypertension: Secondary | ICD-10-CM

## 2015-06-25 DIAGNOSIS — E785 Hyperlipidemia, unspecified: Secondary | ICD-10-CM

## 2015-06-25 DIAGNOSIS — E876 Hypokalemia: Secondary | ICD-10-CM | POA: Diagnosis not present

## 2015-06-25 DIAGNOSIS — I5022 Chronic systolic (congestive) heart failure: Secondary | ICD-10-CM

## 2015-06-25 DIAGNOSIS — I2510 Atherosclerotic heart disease of native coronary artery without angina pectoris: Secondary | ICD-10-CM

## 2015-06-25 DIAGNOSIS — E0590 Thyrotoxicosis, unspecified without thyrotoxic crisis or storm: Secondary | ICD-10-CM | POA: Diagnosis not present

## 2015-06-25 DIAGNOSIS — K219 Gastro-esophageal reflux disease without esophagitis: Secondary | ICD-10-CM | POA: Diagnosis not present

## 2015-06-25 DIAGNOSIS — F329 Major depressive disorder, single episode, unspecified: Secondary | ICD-10-CM | POA: Diagnosis not present

## 2015-06-25 DIAGNOSIS — F32A Depression, unspecified: Secondary | ICD-10-CM

## 2015-06-26 ENCOUNTER — Encounter: Payer: Self-pay | Admitting: Adult Health

## 2015-06-26 NOTE — Progress Notes (Signed)
Patient ID: [name], female   DOB: 04-Nov-1931, 82 y.o.   MRN: [id number]    DATE:    06/25/15  MRN:  [id number]  BIRTHDAY: Jun 10, 1932  Facility:  Nursing Home Location:  [facility]  Nursing Home Room Number: 1004-1  LEVEL OF CARE:  SNF [phone])  Contact Information    Name Relation Home Work Hall Daughter [phone]  [phone]   [name], [name] [phone]  [phone]   [name],[name] Daughter   [phone]       Chief Complaint  Patient presents with  . Medical Management of Chronic Issues    Chronic systolic CHF, PAF, CAD, depression, hypokalemia, hyperlipidemia, hypertension, hyperthyroidism, GERD, constipation and osteoporosis    HISTORY OF PRESENT ILLNESS:  This is an 82 year old female is being seen for a routine visit. She is now a long-term resident of [address]. She has been admitted to [facility] on 03/18/15 from [facility]. She has PMH of systolic CHF, CAD, PAF and history of CVA. She was treated in the hospital for UTI and acute systolic CHF exacerbation. She was treated with antibiotic for UTI and IV Lasix for CHF which was later on changed to po Lasix.  PAST MEDICAL HISTORY:  Past Medical History  Diagnosis Date  . Lumbar back pain   . CAD (coronary artery disease)     a. s/p prior RCA stenting;  b. 12/2014 Cath: LM nl, LAD 25p, LCX 20p/m, RCA 25ost, 20d ISR, EF 25-30%.  . Memory loss   . DJD (degenerative joint disease)   . Depression   . Essential hypertension   . CVA (cerebral infarction)   . Chronic systolic CHF (congestive heart failure) (HCC)     a. 12/2014 Echo: EF 25%, mild LVH, mod MR, sev dil LA, mildly dil RA, PASP .  Marland Kitchen NICM (nonischemic cardiomyopathy) (HCC)     a. 07/2014 EF 50%;  b. 12/2014 Echo: EF 25%.  . Presence of permanent cardiac pacemaker     a. 10/2009 s/p MDT [id number] Adapta DC PPM, Ser # [id number] H.  . Osteoporosis   . Hypercholesterolemia   . Hyperthyroidism    . Anxiety   . Vertigo   . Tachy-brady syndrome (HCC)     a. 10/2009 s/p MDT PPM.  . TIA (transient ischemic attack)   . PSVT (paroxysmal supraventricular tachycardia) (HCC)     a. 12/2009 while in hospital.     CURRENT MEDICATIONS: Reviewed    Medication List       This list is accurate as of: 06/25/15 11:59 PM.  Always use your most recent med list.               alendronate 70 MG tablet  Commonly known as:  FOSAMAX  Take 1 tablet (70 mg total) by mouth once a week. Take with a full glass of water on an empty stomach.     apixaban 5 MG Tabs tablet  Commonly known as:  ELIQUIS  Take 1 tablet (5 mg total) by mouth 2 (two) times daily.     Calcium Carbonate-Vit D-Min 600-400 MG-UNIT Tabs  Take 1 tablet by mouth 2 (two) times daily.     carvedilol 3.125 MG tablet  Commonly known as:  COREG  Take 1 tablet (3.125 mg total) by mouth 2 (two) times daily with a meal.     CENTRUM SILVER tablet  Take 1 tablet by mouth daily.     furosemide 40 MG tablet  Commonly  known as:  LASIX  Take 1 tablet (40 mg total) by mouth 2 (two) times daily.     hydrocortisone 2.5 % rectal cream  Commonly known as:  ANUSOL-HC  Place 1 application rectally 2 (two) times daily as needed for hemorrhoids or itching.     lisinopril 2.5 MG tablet  Commonly known as:  PRINIVIL,ZESTRIL  TAKE 1 TABLET BY MOUTH EVERY DAY     methimazole 10 MG tablet  Commonly known as:  TAPAZOLE  Take 10 mg by mouth 3 (three) times a week. Monday, Wednesday, and Friday     nitroGLYCERIN 0.4 MG SL tablet  Commonly known as:  NITROSTAT  Place 1 tablet (0.4 mg total) under the tongue every 5 (five) minutes as needed. For chest pain     pantoprazole 40 MG tablet  Commonly known as:  PROTONIX  Take 1 tablet (40 mg total) by mouth daily at 6 (six) AM.     polyethylene glycol packet  Commonly known as:  MIRALAX / GLYCOLAX  Take 17 g by mouth daily.     potassium chloride 10 MEQ tablet  Commonly known as:  K-DUR    Take 1 tablet (10 mEq total) by mouth daily.     sertraline 50 MG tablet  Commonly known as:  ZOLOFT  Take 1 tablet (50 mg total) by mouth daily.     simvastatin 40 MG tablet  Commonly known as:  ZOCOR  TAKE 1 TABLET BY MOUTH EVERY DAY AT 6PM     sodium chloride 0.65 % Soln nasal spray  Commonly known as:  OCEAN  Place 2 sprays into both nostrils 2 (two) times daily.     vitamin C 500 MG tablet  Commonly known as:  ASCORBIC ACID  Take 500 mg by mouth daily.         Allergies  Allergen Reactions  . Namenda [Memantine Hcl] Other (See Comments)    Dizzy  . Iodine Other (See Comments)    unknown  . Iohexol Hives     Code: HIVES, Desc: hives with nonionic contrast 7 yrs ago ([place] iv benedryl given   . Peanut-Containing Drug Products Other (See Comments)    unknown     REVIEW OF SYSTEMS:   GENERAL: no change in appetite, no fatigue, no weight changes, no fever, chills or weakness EYES: Denies change in vision, dry eyes, eye pain, itching or discharge EARS: Denies change in hearing, ringing in ears, or earache NOSE: Denies nasal congestion or epistaxis MOUTH and THROAT: Denies oral discomfort, gingival pain or bleeding, pain from teeth or hoarseness   RESPIRATORY: no cough, SOB, DOE, wheezing, hemoptysis CARDIAC: no chest pain, edema or palpitations GI: no abdominal pain, diarrhea, constipation, heart burn, nausea or vomiting GU: Denies dysuria, frequency, hematuria, incontinence, or discharge PSYCHIATRIC: Denies feeling of depression or anxiety. No report of hallucinations, insomnia, paranoia, or agitation  PHYSICAL EXAMINATION  GENERAL APPEARANCE: Well nourished. In no acute distress. Normal body habitus HEAD: Normal in size and contour. No evidence of trauma EYES: Lids open and close normally. No blepharitis, entropion or ectropion. PERRL. Conjunctivae are clear and sclerae are white. Lenses are without opacity EARS: Pinnae are normal. Patient hears normal voice  tunes of the examiner MOUTH and THROAT: Lips are without lesions. Oral mucosa is moist and without lesions. Tongue is normal in shape, size, and color and without lesions NECK: supple, trachea midline, no neck masses, no thyroid tenderness, no thyromegaly LYMPHATICS: no LAN in the neck, no supraclavicular  LAN RESPIRATORY: breathing is even & unlabored, BS CTAB CARDIAC: RRR, no murmur,no extra heart sounds, no edema, has pacemaker GI: abdomen soft, normal BS, no masses, no tenderness, no hepatomegaly, no splenomegaly EXTREMITIES:  Able to move X 4 extremities PSYCHIATRIC: Alert and oriented X 3. Affect and behavior are appropriate  LABS/RADIOLOGY: Labs reviewed: 04/14/15  cholesterol 172 HDL 52 LDL 100 triglycerides 102 TSH 0.672 03/25/15  sodium 141 potassium 3.5 glucose 154 BUN 19 creatinine 0.75 calcium 8.7 03/24/15  sodium 156 potassium 4.0 glucose 122 BUN 25 creatinine 0.77 calcium 9.6 total protein 6.7 albumin 3.56 globulin 3.1 total bilirubin 0.38 alkaline phosphatase 72 SGOT 20 SGPT 17 WBC 8.7 hemoglobin 14.9 hematocrit 48.2 MCV 88.0 platelet 280 Basic Metabolic Panel: Recent Labs Labs reviewed: Basic Metabolic Panel:  Recent Labs  98/26/41 0632 07/28/14 0838  01/03/15 0353  03/16/15 0315 03/17/15 0914 03/26/15 1010  NA 140 141  < > 139  < > 142 139 141  K 3.9 3.6  < > 3.4*  < > 4.6 3.2* 3.3*  CL 105 105  < > 93*  < > 96* 95* 100  CO2 31 29  < > 38*  < > 35* 35* 31  GLUCOSE 111* 113*  < > 130*  < > 116* 181* 96  BUN 17 13  < > 21*  < > 21* 21* 21  CREATININE 0.86 0.81  < > 1.00  < > 0.93 0.79 0.68  CALCIUM 9.5 9.4  < > 8.7*  < > 8.6* 8.4* 8.8  MG 1.9 1.9  --  2.2  --   --   --   --   < > = values in this interval not displayed. Liver Function Tests:  Recent Labs  08/25/14 1822 03/13/15 1000 03/14/15 0252  AST 16 32 28  ALT 14 22 24   ALKPHOS 47 66 66  BILITOT 0.5 0.9 0.7  PROT 6.2 6.1* 6.5  ALBUMIN 3.0* 3.3* 3.2*    Recent Labs  08/25/14 1822  LIPASE 36     CBC:  Recent Labs  12/29/14 2249  03/13/15 1000 03/14/15 0252 03/15/15 0320 03/26/15 1010  WBC 6.8  < > 7.0 9.9 8.5 9.3  NEUTROABS 4.3  --  4.6  --   --  5.1  HGB 13.4  < > 14.3 14.6 14.6 14.6  HCT 42.2  < > 45.6 46.7* 47.2* 45.2  MCV 86.1  < > 87.0 88.6 89.4 85.4  PLT 218  < > 234 233 243 320  < > = values in this interval not displayed.  Lipid Panel:  Recent Labs  07/14/14 1559 08/24/14 0705  HDL 72 45   Cardiac Enzymes:  Recent Labs  12/30/14 1925 12/31/14 0318 03/13/15 1000  TROPONINI 0.07* 0.08* 0.05*   CBG:  Recent Labs  01/06/15 2109 01/07/15 0638 01/07/15 1032  GLUCAP 170* 119* 132*    ASSESSMENT/PLAN:  Chronic systolic CHF - continue Coreg 5.830 mg 1 tab by mouth twice a day, Lasix 40 mg 1 tab by mouth twice a day and lisinopril 2.5 mg 1 tab by mouth daily  PAF - rate controlled; continue Eliquis 5 mg 1 tab twice a day and Coreg 3.125 mg 1 tab by mouth twice a day  CAD S/P RCA stent - stable; continue NTG when necessary  Depression - mood this is stable; continue Zoloft 50 mg 1 tab by mouth daily  Hyperlipidemia - continue Zocor 40 mg 1 tab by mouth every 6 p.m.  Hypertension -  continue Coreg, lisinopril and Lasix  Osteoporosis - continue Fosamax 70 mg weekly and calcium carbonate vitamin D 600-400 mg-unit 1 tab by mouth twice a day  Hypokalemia - K3.3; continue KCl 10 meq 1 tab daily  Hyperthyroidism - continue Methimazole 10 mg 1 tab by mouth 3 times/week (M-W-F)    Constipation -  continue MiraLAX 17 g by mouth daily  GERD - continue Protonix 40 mg daily    Goals of care:  Long-term care    [facility], NP [facility] [phone]

## 2015-06-26 NOTE — Progress Notes (Addendum)
Patient ID: [name], female   DOB: [birth year], 82 y.o.   MRN: [id number]    DATE:    03/19/15   MRN:  [id number]  BIRTHDAY: 16-Aug-1931  Facility:  Nursing Home Location:  [facility]  Nursing Home Room Number: 1004-1  LEVEL OF CARE:  SNF [phone])  Contact Information    Name Relation Home Work [place] Daughter [phone]  [phone]   Mertie, Haslem [phone]  [phone]   [name],[name] Daughter   [phone]       Chief Complaint  Patient presents with  . Hospitalization Follow-up    Physical deconditioning, UTI, systolic CHF, PAF, CAD, depression, hyperlipidemia, hypertension and osteoporosis    HISTORY OF PRESENT ILLNESS:  This is an 82 year old female was been admitted to [facility] on 03/18/15 from [facility] She has PMH of systolic CHF, CAD, PAF and history of CVA. She was treated in the hospital for UTI and acute systolic CHF exacerbation. She was treated with antibiotic for UTI and IV Lasix for CHF which was later on changed to po Lasix.  She has been admitted for a short-term rehabilitation.  PAST MEDICAL HISTORY:  Past Medical History  Diagnosis Date  . Lumbar back pain   . CAD (coronary artery disease)     a. s/p prior RCA stenting;  b. 12/2014 Cath: LM nl, LAD 25p, LCX 20p/m, RCA 25ost, 20d ISR, EF 25-30%.  . Memory loss   . DJD (degenerative joint disease)   . Depression   . Essential hypertension   . CVA (cerebral infarction)   . Chronic systolic CHF (congestive heart failure) (HCC)     a. 12/2014 Echo: EF 25%, mild LVH, mod MR, sev dil LA, mildly dil RA, PASP .  Marland Kitchen NICM (nonischemic cardiomyopathy) (HCC)     a. 07/2014 EF 50%;  b. 12/2014 Echo: EF 25%.  . Presence of permanent cardiac pacemaker     a. 10/2009 s/p MDT ADDRL01 Adapta DC PPM, Ser # [id number] H.  . Osteoporosis   . Hypercholesterolemia   . Hyperthyroidism   . Anxiety   . Vertigo   . Tachy-brady syndrome (HCC)     a.  10/2009 s/p MDT PPM.  . TIA (transient ischemic attack)   . PSVT (paroxysmal supraventricular tachycardia) (HCC)     a. 12/2009 while in hospital.     CURRENT MEDICATIONS: Reviewed   Patient's Medications  New Prescriptions   No medications on file  Previous Medications   ALENDRONATE (FOSAMAX) 70 MG TABLET    Take 1 tablet (70 mg total) by mouth once a week. Take with a full glass of water on an empty stomach.   APIXABAN (ELIQUIS) 5 MG TABS TABLET    Take 1 tablet (5 mg total) by mouth 2 (two) times daily.   ASCORBIC ACID (VITAMIN C) 500 MG TABLET    Take 500 mg by mouth daily.     CALCIUM CARBONATE-VIT D-MIN 600-400 MG-UNIT TABS    Take 1 tablet by mouth 2 (two) times daily.    CARVEDILOL (COREG) 3.125 MG TABLET    Take 1 tablet (3.125 mg total) by mouth 2 (two) times daily with a meal.   FUROSEMIDE (LASIX) 40 MG TABLET    Take 1 tablet (40 mg total) by mouth 2 (two) times daily.   LISINOPRIL (PRINIVIL,ZESTRIL) 2.5 MG TABLET    TAKE 1 TABLET BY MOUTH EVERY DAY   METHIMAZOLE (TAPAZOLE) 10 MG TABLET    Take 10 mg  by mouth 3 (three) times a week. Monday, Wednesday, and Friday   MULTIPLE VITAMINS-MINERALS (CENTRUM SILVER) TABLET    Take 1 tablet by mouth daily.     NITROGLYCERIN (NITROSTAT) 0.4 MG SL TABLET    Place 1 tablet (0.4 mg total) under the tongue every 5 (five) minutes as needed. For chest pain   PANTOPRAZOLE (PROTONIX) 40 MG TABLET    Take 1 tablet (40 mg total) by mouth daily at 6 (six) AM.       POTASSIUM CHLORIDE (K-DUR) 10 MEQ TABLET    Take 1 tablet (10 mEq total) by mouth daily.   SERTRALINE (ZOLOFT) 50 MG TABLET    Take 1 tablet (50 mg total) by mouth daily.   SIMVASTATIN (ZOCOR) 40 MG TABLET    TAKE 1 TABLET BY MOUTH EVERY DAY AT 6PM          AMOXICILLIN (AMOXIL) 500 MG CAPSULE    Take 1 capsule (500 mg total) by mouth every 8 (eight) hours.    Allergies  Allergen Reactions  . Namenda [Memantine Hcl] Other (See Comments)    Dizzy  . Iodine Other (See Comments)     unknown  . Iohexol Hives     Code: HIVES, Desc: hives with nonionic contrast 7 yrs ago ([place] iv benedryl given   . Peanut-Containing Drug Products Other (See Comments)    unknown     REVIEW OF SYSTEMS:  GENERAL: no change in appetite, no fatigue, no weight changes, no fever, chills or weakness EYES: Denies change in vision, dry eyes, eye pain, itching or discharge EARS: Denies change in hearing, ringing in ears, or earache NOSE: Denies nasal congestion or epistaxis MOUTH and THROAT: Denies oral discomfort, gingival pain or bleeding, pain from teeth or hoarseness   RESPIRATORY: no cough, SOB, DOE, wheezing, hemoptysis CARDIAC: no chest pain, edema or palpitations GI: no abdominal pain, diarrhea, constipation, heart burn, nausea or vomiting GU: Denies dysuria, frequency, hematuria, incontinence, or discharge PSYCHIATRIC: Denies feeling of depression or anxiety. No report of hallucinations, insomnia, paranoia, or agitation  PHYSICAL EXAMINATION  GENERAL APPEARANCE: Well nourished. In no acute distress. Normal body habitus HEAD: Normal in size and contour. No evidence of trauma EYES: Lids open and close normally. No blepharitis, entropion or ectropion. PERRL. Conjunctivae are clear and sclerae are white. Lenses are without opacity EARS: Pinnae are normal. Patient hears normal voice tunes of the examiner MOUTH and THROAT: Lips are without lesions. Oral mucosa is moist and without lesions. Tongue is normal in shape, size, and color and without lesions NECK: supple, trachea midline, no neck masses, no thyroid tenderness, no thyromegaly LYMPHATICS: no LAN in the neck, no supraclavicular LAN RESPIRATORY: breathing is even & unlabored, BS CTAB CARDIAC: RRR, no murmur,no extra heart sounds, no edema, has pacemaker GI: abdomen soft, normal BS, no masses, no tenderness, no hepatomegaly, no splenomegaly EXTREMITIES:  Able to move X 4 extremities, generalized weakness PSYCHIATRIC: Alert and  oriented X 3. Affect and behavior are appropriate  LABS/RADIOLOGY: Labs reviewed: Basic Metabolic Panel: Recent Labs  07/27/14 0632 07/28/14 0838  01/03/15 0353  03/16/15 0315 03/17/15 0914   NA 140 141  < > 139  < > 142 139   K 3.9 3.6  < > 3.4*  < > 4.6 3.2*   CL 105 105  < > 93*  < > 96* 95*   CO2 31 29  < > 38*  < > 35* 35*   GLUCOSE 111* 113*  < > 130*  < >  116* 181*   BUN 17 13  < > 21*  < > 21* 21*   CREATININE 0.86 0.81  < > 1.00  < > 0.93 0.79   CALCIUM 9.5 9.4  < > 8.7*  < > 8.6* 8.4*   MG 1.9 1.9  --  2.2  --   --   --    < > = values in this interval not displayed. Liver Function Tests:  Recent Labs  12/29/14 2249  03/13/15 1000 03/14/15 0252 03/15/15 0320   WBC 6.8  < > 7.0 9.9 8.5   NEUTROABS 4.3  --  4.6  --   --    HGB 13.4  < > 14.3 14.6 14.6   HCT 42.2  < > 45.6 46.7* 47.2*   MCV 86.1  < > 87.0 88.6 89.4   PLT 218  < > 234 233 243   < > = values in this interval not displayed.  Recent Labs  08/25/14 1822 03/13/15 1000 03/14/15 0252  AST 16 32 28  ALT 14 22 24   ALKPHOS 47 66 66  BILITOT 0.5 0.9 0.7  PROT 6.2 6.1* 6.5  ALBUMIN 3.0* 3.3* 3.2*    Recent Labs  08/25/14 1822  LIPASE 36   Lipid Panel:  Recent Labs  07/14/14 1559 08/24/14 0705  HDL 72 45   Cardiac Enzymes:  Recent Labs  12/30/14 1925 12/31/14 0318 03/13/15 1000  TROPONINI 0.07* 0.08* 0.05*    CBG:  Recent Labs  01/06/15 2109 01/07/15 0638 01/07/15 1032  GLUCAP 170* 119* 132*     ASSESSMENT/PLAN:  Physical deconditioning - for rehabilitation  UTI - continue amoxicillin 500 mg 1 capsule every 8 hours 7 days  Chronic systolic CHF - continue Coreg 3.149 mg 1 tab by mouth twice a day, Lasix 40 mg 1 tab by mouth twice a day and lisinopril 2.5 mg 1 tab by mouth daily  PAF - rate controlled; continue Eliquis 5 mg 1 tab twice a day and Coreg 3.125 mg 1 tab by mouth twice a day  CAD S/P RCA stent - stable; continue NTG when necessary  Depression - mood  this is stable; continue Zoloft 50 mg 1 tab by mouth daily  Hyperlipidemia - continue Zocor 40 mg 1 tab by mouth every 6 p.m.  Hypertension - continue Coreg, lisinopril and Lasix  Osteoporosis - continue Fosamax 70 mg weekly and calcium carbonate vitamin D 600-400 mg-unit 1 tab by mouth twice a day  Hypokalemia - K3.2; continue KCl 10 meq 1 tab daily  Hyperthyroidism - continue Methimazole 10 mg 1 tab by mouth 3 times/week (M-W-F)       Goals of care:  Short-term rehabilitation    [facility], NP [facility] [phone]

## 2015-06-30 LAB — BASIC METABOLIC PANEL
BUN: 20 mg/dL (ref 4–21)
Creatinine: 0.8 mg/dL (ref 0.5–1.1)
Glucose: 96 mg/dL
Potassium: 3.3 mmol/L — AB (ref 3.4–5.3)
SODIUM: 144 mmol/L (ref 137–147)

## 2015-07-07 ENCOUNTER — Encounter: Payer: Self-pay | Admitting: Internal Medicine

## 2015-07-07 ENCOUNTER — Ambulatory Visit (INDEPENDENT_AMBULATORY_CARE_PROVIDER_SITE_OTHER): Payer: Medicare Other | Admitting: Internal Medicine

## 2015-07-07 VITALS — BP 129/85 | HR 66 | Ht 62.0 in | Wt 167.0 lb

## 2015-07-07 DIAGNOSIS — I2510 Atherosclerotic heart disease of native coronary artery without angina pectoris: Secondary | ICD-10-CM | POA: Diagnosis not present

## 2015-07-07 DIAGNOSIS — I480 Paroxysmal atrial fibrillation: Secondary | ICD-10-CM | POA: Diagnosis not present

## 2015-07-07 DIAGNOSIS — Z950 Presence of cardiac pacemaker: Secondary | ICD-10-CM

## 2015-07-07 DIAGNOSIS — I5022 Chronic systolic (congestive) heart failure: Secondary | ICD-10-CM

## 2015-07-07 LAB — BASIC METABOLIC PANEL WITH GFR
BUN: 21 mg/dL (ref 4–21)
Creatinine: 0.7 mg/dL (ref 0.5–1.1)
Glucose: 101 mg/dL
Potassium: 3.4 mmol/L (ref 3.4–5.3)
Sodium: 142 mmol/L (ref 137–147)

## 2015-07-07 NOTE — Patient Instructions (Addendum)
Your physician recommends that you continue on your current medications as directed. Please refer to the Current Medication list given to you today.  Remote monitoring is used to monitor your Pacemaker of ICD from home. This monitoring reduces the number of office visits required to check your device to one time per year. It allows [place] to keep an eye on the functioning of your device to ensure it is working properly. You are scheduled for a device check from home on 10/06/15 . You may send your transmission at any time that day. If you have a wireless device, the transmission will be sent automatically. After your physician reviews your transmission, you will receive a postcard with your next transmission date.   Your physician wants you to follow-up in: 1 YEAR WITH DR. Ladona Ridgel.   You will receive a reminder letter in the mail two months in advance. If you don't receive a letter, please call our office to schedule the follow-up appointment.

## 2015-07-07 NOTE — Assessment & Plan Note (Signed)
She is sedentary and she is not walking at all. She is asymptomatic. She will continue daily weights and a low sodium diet.

## 2015-07-07 NOTE — Assessment & Plan Note (Signed)
Her blood pressure is 130/65. She will continue her medical therapy.

## 2015-07-07 NOTE — Assessment & Plan Note (Signed)
Her medtronic DDD PM is working normally. Will recheck in several months. 

## 2015-07-07 NOTE — Progress Notes (Signed)
HPI [name] returns today for followup. She is a pleasant 82 yo woman with a h/o stroke, symptomatic bradycardia, paroxysmal atrial fibrillation, status post pacemaker insertion. The patient has had no recurrent syncope or falls. She complains of generalized malaise. She denies palpitations. No peripheral edema. She denies dyspnea or chest pain. She is quite sedentary. She is not on anti-coagulation because of problems with falls. She was in the hospital with a UTI several months ago. She has had some problems with volume overload. Allergies  Allergen Reactions  . Namenda [Memantine Hcl] Other (See Comments)    Dizzy  . Iodine Other (See Comments)    unknown  . Iohexol Hives     Code: HIVES, Desc: hives with nonionic contrast 7 yrs ago ([place] iv benedryl given   . Peanut-Containing Drug Products Other (See Comments)    unknown     Current Outpatient Prescriptions  Medication Sig Dispense Refill  . alendronate (FOSAMAX) 70 MG tablet Take 1 tablet (70 mg total) by mouth once a week. Take with a full glass of water on an empty stomach. (Patient taking differently: Take 70 mg by mouth once a week. Take on Friday with a full glass of water on an empty stomach.) 4 tablet 5  . apixaban (ELIQUIS) 5 MG TABS tablet Take 1 tablet (5 mg total) by mouth 2 (two) times daily. 60 tablet 11  . Ascorbic Acid (VITAMIN C) 500 MG tablet Take 500 mg by mouth daily.      . Calcium Carbonate-Vit D-Min 600-400 MG-UNIT TABS Take 1 tablet by mouth 2 (two) times daily.     . carvedilol (COREG) 3.125 MG tablet Take 1 tablet (3.125 mg total) by mouth 2 (two) times daily with a meal.    . furosemide (LASIX) 40 MG tablet Take 1 tablet (40 mg total) by mouth 2 (two) times daily. 60 tablet 6  . hydrocortisone (ANUSOL-HC) 2.5 % rectal cream Place 1 application rectally 2 (two) times daily as needed for hemorrhoids or itching.    Marland Kitchen lisinopril (PRINIVIL,ZESTRIL) 2.5 MG tablet TAKE 1 TABLET BY MOUTH EVERY DAY (Patient  taking differently: TAKE 1 TABLET BY MOUTH TWICE DAILY) 30 tablet 5  . methimazole (TAPAZOLE) 10 MG tablet Take 10 mg by mouth 3 (three) times a week. Monday, Wednesday, and Friday  0  . Multiple Vitamins-Minerals (CENTRUM SILVER) tablet Take 1 tablet by mouth daily.      . nitroGLYCERIN (NITROSTAT) 0.4 MG SL tablet Place 1 tablet (0.4 mg total) under the tongue every 5 (five) minutes as needed. For chest pain 30 tablet 0  . pantoprazole (PROTONIX) 40 MG tablet Take 1 tablet (40 mg total) by mouth daily at 6 (six) AM. 30 tablet 11  . polyethylene glycol (MIRALAX / GLYCOLAX) packet Take 17 g by mouth daily.     . potassium chloride (K-DUR) 10 MEQ tablet Take 1 tablet (10 mEq total) by mouth daily. 30 tablet 1  . sertraline (ZOLOFT) 50 MG tablet Take 1 tablet (50 mg total) by mouth daily. 30 tablet 5  . simvastatin (ZOCOR) 40 MG tablet TAKE 1 TABLET BY MOUTH EVERY DAY AT 6PM 30 tablet 5  . sodium chloride (OCEAN) 0.65 % SOLN nasal spray Place 2 sprays into both nostrils 2 (two) times daily.     No current facility-administered medications for this visit.     Past Medical History  Diagnosis Date  . Lumbar back pain   . CAD (coronary artery disease)     a.  s/p prior RCA stenting;  b. 12/2014 Cath: LM nl, LAD 25p, LCX 20p/m, RCA 25ost, 20d ISR, EF 25-30%.  . Memory loss   . DJD (degenerative joint disease)   . Depression   . Essential hypertension   . CVA (cerebral infarction)   . Chronic systolic CHF (congestive heart failure) (HCC)     a. 12/2014 Echo: EF 25%, mild LVH, mod MR, sev dil LA, mildly dil RA, PASP .  Marland Kitchen NICM (nonischemic cardiomyopathy) (HCC)     a. 07/2014 EF 50%;  b. 12/2014 Echo: EF 25%.  . Presence of permanent cardiac pacemaker     a. 10/2009 s/p MDT ADDRL01 Adapta DC PPM, Ser # [id number] H.  . Osteoporosis   . Hypercholesterolemia   . Hyperthyroidism   . Anxiety   . Vertigo   . Tachy-brady syndrome (HCC)     a. 10/2009 s/p MDT PPM.  . TIA (transient ischemic attack)    . PSVT (paroxysmal supraventricular tachycardia) (HCC)     a. 12/2009 while in hospital.    ROS:   All systems reviewed and negative except as noted in the HPI.   Past Surgical History  Procedure Laterality Date  . Pacemaker insertion  2011  . Total knee arthroplasty Bilateral 2002  . Abdominal hysterectomy  1986  . Bilateral stenting  1999    to the RCA  . Cardiac catheterization  05/23/10  . Total abdominal hysterectomy w/ bilateral salpingoophorectomy    . Cardiac catheterization N/A 01/06/2015    Procedure: Left Heart Cath and Coronary Angiography;  Surgeon: Lennette Bihari, MD;  Location: [facility] INVASIVE CV LAB;  Service: Cardiovascular;  Laterality: N/A;     Family History  Problem Relation Age of Onset  . Thyroid disease Daughter   . Diabetes Other   . Hypertension Other   . Arthritis Other   . Coronary artery disease Mother 3  . Coronary artery disease Father 54  . Heart attack Father   . Heart attack Sister   . Heart attack Brother   . Hypertension Sister   . Hypertension Brother   . Stroke Brother   . Stroke Sister      Social History   Social History  . Marital Status: Married    Spouse Name: N/A  . Number of Children: 2  . Years of Education: 8   Occupational History  . retired     Programmer, applications at office   Social History Main Topics  . Smoking status: Never Smoker   . Smokeless tobacco: Never Used  . Alcohol Use: No  . Drug Use: No  . Sexual Activity: Not on file   Other Topics Concern  . Not on file   Social History Narrative   Married   Right handed   Caffeine use - occasionally      Pulse 66  Ht  (1.575 m)  Wt 167 lb (75.751 kg)  BMI 30.54 kg/m2  Physical Exam:  elderly, stable appearing 82 year old woman, NAD HEENT: Unremarkable Neck:  7 cm JVD, no thyromegally Back:  No CVA tenderness Lungs:  Clear with no wheezes, rales, or rhonchi. HEART:  Regular rate rhythm, no murmurs, no rubs, no clicks Abd:  soft, positive  bowel sounds, no organomegally, no rebound, no guarding Ext:  2 plus pulses, no edema, no cyanosis, no clubbing Skin:  No rashes no nodules Neuro:  CN II through XII intact, motor grossly intact  DEVICE  Normal device function.  See PaceArt for details.  Assess/Plan:

## 2015-07-13 LAB — CUP PACEART INCLINIC DEVICE CHECK
Battery Remaining Longevity: 124 mo
Brady Statistic AP VP Percent: 1 %
Brady Statistic AS VS Percent: 57 %
Date Time Interrogation Session: 20170118180157
Implantable Lead Location: 753860
Implantable Lead Model: 5092
Lead Channel Impedance Value: 574 Ohm
Lead Channel Pacing Threshold Amplitude: 0.75 V
Lead Channel Pacing Threshold Pulse Width: 0.4 ms
Lead Channel Setting Sensing Sensitivity: 4 mV
MDC IDC LEAD IMPLANT DT: 20110525
MDC IDC LEAD IMPLANT DT: 20110525
MDC IDC LEAD LOCATION: 753859
MDC IDC MSMT BATTERY IMPEDANCE: 228 Ohm
MDC IDC MSMT BATTERY VOLTAGE: 2.8 V
MDC IDC MSMT LEADCHNL RA IMPEDANCE VALUE: 438 Ohm
MDC IDC MSMT LEADCHNL RA SENSING INTR AMPL: 4 mV
MDC IDC SET LEADCHNL RA PACING AMPLITUDE: 2 V
MDC IDC SET LEADCHNL RV PACING AMPLITUDE: 3.5 V
MDC IDC SET LEADCHNL RV PACING PULSEWIDTH: 0.4 ms
MDC IDC STAT BRADY AP VS PERCENT: 39 %
MDC IDC STAT BRADY AS VP PERCENT: 3 %

## 2015-07-21 ENCOUNTER — Non-Acute Institutional Stay (SKILLED_NURSING_FACILITY): Payer: Medicare Other | Admitting: Adult Health

## 2015-07-21 ENCOUNTER — Encounter: Payer: Self-pay | Admitting: Adult Health

## 2015-07-21 DIAGNOSIS — M810 Age-related osteoporosis without current pathological fracture: Secondary | ICD-10-CM | POA: Diagnosis not present

## 2015-07-21 DIAGNOSIS — F329 Major depressive disorder, single episode, unspecified: Secondary | ICD-10-CM

## 2015-07-21 DIAGNOSIS — K5900 Constipation, unspecified: Secondary | ICD-10-CM | POA: Diagnosis not present

## 2015-07-21 DIAGNOSIS — I480 Paroxysmal atrial fibrillation: Secondary | ICD-10-CM

## 2015-07-21 DIAGNOSIS — E785 Hyperlipidemia, unspecified: Secondary | ICD-10-CM

## 2015-07-21 DIAGNOSIS — I5022 Chronic systolic (congestive) heart failure: Secondary | ICD-10-CM | POA: Diagnosis not present

## 2015-07-21 DIAGNOSIS — E0590 Thyrotoxicosis, unspecified without thyrotoxic crisis or storm: Secondary | ICD-10-CM

## 2015-07-21 DIAGNOSIS — K649 Unspecified hemorrhoids: Secondary | ICD-10-CM

## 2015-07-21 DIAGNOSIS — F32A Depression, unspecified: Secondary | ICD-10-CM

## 2015-07-21 DIAGNOSIS — I2510 Atherosclerotic heart disease of native coronary artery without angina pectoris: Secondary | ICD-10-CM

## 2015-07-21 DIAGNOSIS — J3489 Other specified disorders of nose and nasal sinuses: Secondary | ICD-10-CM

## 2015-07-21 DIAGNOSIS — I10 Essential (primary) hypertension: Secondary | ICD-10-CM | POA: Diagnosis not present

## 2015-07-21 DIAGNOSIS — K219 Gastro-esophageal reflux disease without esophagitis: Secondary | ICD-10-CM

## 2015-07-21 DIAGNOSIS — E876 Hypokalemia: Secondary | ICD-10-CM

## 2015-07-21 NOTE — Progress Notes (Signed)
Patient ID: [name], female   DOB: [birth year], 83 y.o.   MRN: [id number]    DATE:    07/21/15  MRN:  [id number]  BIRTHDAY: 1932-04-07  Facility:  Nursing Home Location:  [facility]  Nursing Home Room Number: 1004-1  LEVEL OF CARE:  SNF [phone])      Contact Information    Name Relation Home Work [place] Daughter [phone]  [phone]   Ahlea, Bransky [phone]  [phone]   [name],[name] Daughter   [phone]       Chief Complaint  Patient presents with  . Medical Management of Chronic Issues    HISTORY OF PRESENT ILLNESS:  This is an 82 year old female is being seen for a routine visit. She is  a long-term resident of [address]. She was recently started on 2000 mL fluid restriction. No SOB. CHF is stable. She is currently taking Coreg, Lasix and lisinopril. Her heart rate is rate controlled and currently taking a Eliquis and Coreg for atrial fibrillation. Her mood is stable and continues to take Zoloft 50 mg daily. Husband visits daily.  She has been admitted to [facility] on 03/18/15 from [facility]. She has PMH of systolic CHF, CAD, PAF and history of CVA. She was treated in the hospital for UTI and acute systolic CHF exacerbation. She was treated with antibiotic for UTI and IV Lasix for CHF which was later on changed to po Lasix.  PAST MEDICAL HISTORY:  Past Medical History  Diagnosis Date  . Lumbar back pain   . CAD (coronary artery disease)     a. s/p prior RCA stenting;  b. 12/2014 Cath: LM nl, LAD 25p, LCX 20p/m, RCA 25ost, 20d ISR, EF 25-30%.  . Memory loss   . DJD (degenerative joint disease)   . Depression   . Essential hypertension   . CVA (cerebral infarction)   . Chronic systolic CHF (congestive heart failure) (HCC)     a. 12/2014 Echo: EF 25%, mild LVH, mod MR, sev dil LA, mildly dil RA, PASP .  Marland Kitchen NICM (nonischemic cardiomyopathy) (HCC)     a. 07/2014 EF 50%;  b. 12/2014 Echo: EF  25%.  . Presence of permanent cardiac pacemaker     a. 10/2009 s/p MDT ADDRL01 Adapta DC PPM, Ser # [id number] H.  . Osteoporosis   . Hypercholesterolemia   . Hyperthyroidism   . Anxiety   . Vertigo   . Tachy-brady syndrome (HCC)     a. 10/2009 s/p MDT PPM.  . TIA (transient ischemic attack)   . PSVT (paroxysmal supraventricular tachycardia) (HCC)     a. 12/2009 while in hospital.  . PAF (paroxysmal atrial fibrillation) (HCC)   . HLD (hyperlipidemia)   . GERD (gastroesophageal reflux disease)      CURRENT MEDICATIONS: Reviewed    Medication List       This list is accurate as of: 07/21/15 11:59 PM.  Always use your most recent med list.               acetaminophen 325 MG tablet  Commonly known as:  TYLENOL  Take 650 mg by mouth every 4 (four) hours as needed for mild pain or headache.     alendronate 70 MG tablet  Commonly known as:  FOSAMAX  Take 70 mg by mouth once a week. Take with a full glass of water on an empty stomach.     apixaban 5 MG Tabs tablet  Commonly known as:  ELIQUIS  Take 1 tablet (5 mg total) by mouth 2 (two) times daily.     Calcium Carbonate-Vit D-Min 600-400 MG-UNIT Tabs  Take 1 tablet by mouth 2 (two) times daily.     carvedilol 3.125 MG tablet  Commonly known as:  COREG  Take 1 tablet (3.125 mg total) by mouth 2 (two) times daily with a meal.     CENTRUM SILVER tablet  Take 1 tablet by mouth daily.     furosemide 40 MG tablet  Commonly known as:  LASIX  Take 1 tablet (40 mg total) by mouth 2 (two) times daily.     hydrocortisone 2.5 % rectal cream  Commonly known as:  ANUSOL-HC  Place 1 application rectally 2 (two) times daily as needed for hemorrhoids or itching.     KCL-40 PO  Take 40 mEq by mouth daily.     lisinopril 2.5 MG tablet  Commonly known as:  PRINIVIL,ZESTRIL  Take 2.5 mg by mouth daily.     methimazole 10 MG tablet  Commonly known as:  TAPAZOLE  Take 10 mg by mouth 3 (three) times a week. Monday, Wednesday, and  Friday     nitroGLYCERIN 0.4 MG SL tablet  Commonly known as:  NITROSTAT  Place 1 tablet (0.4 mg total) under the tongue every 5 (five) minutes as needed. For chest pain     pantoprazole 40 MG tablet  Commonly known as:  PROTONIX  Take 1 tablet (40 mg total) by mouth daily at 6 (six) AM.     polyethylene glycol packet  Commonly known as:  MIRALAX / GLYCOLAX  Take 17 g by mouth daily.     Protein Powd  Take 1 scoop by mouth 2 (two) times daily. Procel     sertraline 50 MG tablet  Commonly known as:  ZOLOFT  Take 1 tablet (50 mg total) by mouth daily.     simvastatin 40 MG tablet  Commonly known as:  ZOCOR  TAKE 1 TABLET BY MOUTH EVERY DAY AT 6PM     sodium chloride 0.65 % Soln nasal spray  Commonly known as:  OCEAN  Place 2 sprays into both nostrils 2 (two) times daily.     vitamin C 500 MG tablet  Commonly known as:  ASCORBIC ACID  Take 500 mg by mouth daily.         Allergies  Allergen Reactions  . Namenda [Memantine Hcl] Other (See Comments)    Dizzy  . Iodine Other (See Comments)    unknown  . Iohexol Hives     Code: HIVES, Desc: hives with nonionic contrast 7 yrs ago ([place] iv benedryl given   . Peanut-Containing Drug Products Other (See Comments)    unknown     REVIEW OF SYSTEMS:   GENERAL: no change in appetite, no fatigue, no weight changes, no fever, chills or weakness EARS: Denies change in hearing, ringing in ears, or earache NOSE: Denies nasal congestion or epistaxis MOUTH and THROAT: Denies oral discomfort, gingival pain or bleeding, pain from teeth or hoarseness   RESPIRATORY: no cough, SOB, DOE, wheezing, hemoptysis CARDIAC: no chest pain, edema or palpitations GI: no abdominal pain, diarrhea, constipation, heart burn, nausea or vomiting GU: Denies dysuria, frequency, hematuria, incontinence, or discharge PSYCHIATRIC: Denies feeling of depression or anxiety. No report of hallucinations, insomnia, paranoia, or agitation  PHYSICAL  EXAMINATION  GENERAL APPEARANCE: Well nourished. In no acute distress. Normal body habitus HEAD: Normal in size and contour. No evidence of trauma EYES:  Lids open and close normally. No blepharitis, entropion or ectropion. PERRL. Conjunctivae are clear and sclerae are white. Lenses are without opacity EARS: Pinnae are normal. Patient hears normal voice tunes of the examiner MOUTH and THROAT: Lips are without lesions. Oral mucosa is moist and without lesions. Tongue is normal in shape, size, and color and without lesions NECK: supple, trachea midline, no neck masses, no thyroid tenderness, no thyromegaly LYMPHATICS: no LAN in the neck, no supraclavicular LAN RESPIRATORY: breathing is even & unlabored, BS CTAB CARDIAC: RRR, no murmur,no extra heart sounds, no edema, has pacemaker GI: abdomen soft, normal BS, no masses, no tenderness, no hepatomegaly, no splenomegaly EXTREMITIES:  Able to move X 4 extremities; BLE generalized weakness PSYCHIATRIC: Alert and oriented X 3. Affect and behavior are appropriate  LABS/RADIOLOGY: Labs reviewed: 04/14/15  cholesterol 172 HDL 52 LDL 100 triglycerides 102 TSH 0.672 03/24/15  sodium 156 potassium 4.0 glucose 122 BUN 25 creatinine 0.77 calcium 9.6 total protein 6.7 albumin 3.56 globulin 3.1 total bilirubin 0.38 alkaline phosphatase 72 SGOT 20 SGPT 17 WBC 8.7 hemoglobin 14.9 hematocrit 48.2 MCV 88.0 platelet 280 Basic Metabolic Panel:  Recent Labs  16/10/96 0353  03/16/15 0315 03/17/15 0914  03/26/15 1010 03/29/15 06/30/15 07/07/15  NA 139  < > 142 139  < > 141 145 144 142  K 3.4*  < > 4.6 3.2*  < > 3.3* 3.8 3.3* 3.4  CL 93*  < > 96* 95*  --  100  --   --   --   CO2 38*  < > 35* 35*  --  31  --   --   --   GLUCOSE 130*  < > 116* 181*  --  96  --   --   --   BUN 21*  < > 21* 21*  < > 21 19 20 21   CREATININE 1.00  < > 0.93 0.79  < > 0.68 0.8 0.8 0.7  CALCIUM 8.7*  < > 8.6* 8.4*  --  8.8  --   --   --   MG 2.2  --   --   --   --   --   --   --   --    < > = values in this interval not displayed. Liver Function Tests:  Recent Labs  08/25/14 1822 03/13/15 1000 03/14/15 0252  AST 16 32 28  ALT 14 22 24   ALKPHOS 47 66 66  BILITOT 0.5 0.9 0.7  PROT 6.2 6.1* 6.5  ALBUMIN 3.0* 3.3* 3.2*    Recent Labs  08/25/14 1822  LIPASE 36   CBC:  Recent Labs  12/29/14 2249  03/13/15 1000 03/14/15 0252 03/15/15 0320 03/24/15 03/26/15 1010  WBC 6.8  < > 7.0 9.9 8.5 8.7 9.3  NEUTROABS 4.3  --  4.6  --   --   --  5.1  HGB 13.4  < > 14.3 14.6 14.6 14.9 14.6  HCT 42.2  < > 45.6 46.7* 47.2* 48* 45.2  MCV 86.1  < > 87.0 88.6 89.4  --  85.4  PLT 218  < > 234 233 243 280 320  < > = values in this interval not displayed.  Lipid Panel:  Recent Labs  08/24/14 0705 04/14/15  HDL 45 52   Cardiac Enzymes:  Recent Labs  12/30/14 1925 12/31/14 0318 03/13/15 1000  TROPONINI 0.07* 0.08* 0.05*   CBG:  Recent Labs  01/06/15 2109 01/07/15 0638 01/07/15 1032  GLUCAP 170* 119*  132*    ASSESSMENT/PLAN:  Nasal dryness - recently started on saline nasal spray 2 sprays in each nostril twice a day  Hemorrhoids - recently started on Anusol cream 1% apply topical to external hemorrhoids twice a day when necessary  Chronic systolic CHF - continue Coreg 1.610 mg 1 tab by mouth twice a day, Lasix 40 mg 1 tab by mouth twice a day and lisinopril 2.5 mg 1 tab by mouth daily; continue 2000 mL fluid restriction; check BMP  PAF - rate controlled; continue Eliquis 5 mg 1 tab twice a day and Coreg 3.125 mg 1 tab by mouth twice a day; check CBC  CAD S/P RCA stent - stable; continue NTG when necessary  Depression - mood  is stable; continue Zoloft 50 mg 1 tab by mouth daily  Hyperlipidemia - continue Zocor 40 mg 1 tab by mouth every 6 p.m. 04/14/15  cholesterol 172 HDL 52 LDL 100 triglycerides 102 TSH 0.672  Hypertension - continue Coreg, lisinopril and Lasix  Osteoporosis - continue Fosamax 70 mg weekly and calcium carbonate vitamin D  600-400 mg-unit 1 tab by mouth twice a day  Hypokalemia - K3.4; recently increased KCl 40 meq  daily  Hyperthyroidism - continue Methimazole 10 mg 1 tab by mouth 3 times/week (M-W-F) ; tsh 0.672   Constipation -  continue MiraLAX 17 g by mouth daily  GERD - continue Protonix 40 mg daily    Goals of care:  Long-term care    [facility], NP [facility] [phone]

## 2015-07-22 LAB — BASIC METABOLIC PANEL
BUN: 20 mg/dL (ref 4–21)
Creatinine: 0.7 mg/dL (ref 0.5–1.1)
GLUCOSE: 108 mg/dL
Potassium: 3.8 mmol/L (ref 3.4–5.3)
Sodium: 143 mmol/L (ref 137–147)

## 2015-07-22 LAB — CBC AND DIFFERENTIAL
HCT: 42 % (ref 36–46)
Hemoglobin: 13.4 g/dL (ref 12.0–16.0)
Platelets: 271 10*3/uL (ref 150–399)
WBC: 9.9 10*3/mL

## 2015-08-06 ENCOUNTER — Ambulatory Visit (INDEPENDENT_AMBULATORY_CARE_PROVIDER_SITE_OTHER): Payer: Medicare Other | Admitting: Internal Medicine

## 2015-08-06 VITALS — BP 112/72 | HR 76

## 2015-08-06 DIAGNOSIS — I2510 Atherosclerotic heart disease of native coronary artery without angina pectoris: Secondary | ICD-10-CM | POA: Diagnosis not present

## 2015-08-06 DIAGNOSIS — I5022 Chronic systolic (congestive) heart failure: Secondary | ICD-10-CM | POA: Diagnosis not present

## 2015-08-06 LAB — CBC
HCT: 43.6 % (ref 36.0–46.0)
HEMOGLOBIN: 14.3 g/dL (ref 12.0–15.0)
MCH: 29 pg (ref 26.0–34.0)
MCHC: 32.8 g/dL (ref 30.0–36.0)
MCV: 88.4 fL (ref 78.0–100.0)
MPV: 10.2 fL (ref 8.6–12.4)
PLATELETS: 295 10*3/uL (ref 150–400)
RBC: 4.93 MIL/uL (ref 3.87–5.11)
RDW: 15.2 % (ref 11.5–15.5)
WBC: 9.9 10*3/uL (ref 4.0–10.5)

## 2015-08-06 LAB — BASIC METABOLIC PANEL
BUN: 22 mg/dL (ref 7–25)
CALCIUM: 9.3 mg/dL (ref 8.6–10.4)
CO2: 29 mmol/L (ref 20–31)
CREATININE: 0.7 mg/dL (ref 0.60–0.88)
Chloride: 101 mmol/L (ref 98–110)
GLUCOSE: 133 mg/dL — AB (ref 65–99)
Potassium: 3.9 mmol/L (ref 3.5–5.3)
SODIUM: 138 mmol/L (ref 135–146)

## 2015-08-06 NOTE — Patient Instructions (Signed)
.  isntcur Your physician recommends that you return for lab work in: today (BMET, CBC)  Your physician wants you to follow-up in: 6 MONTHS WITH DR. Tenny Craw.  You will receive a reminder letter in the mail two months in advance. If you don't receive a letter, please call our office to schedule the follow-up appointment.

## 2015-08-06 NOTE — Progress Notes (Signed)
Cardiology Office Note   Date:  08/06/2015   ID:  [name], DOB 01/18/1932, MRN [id number]  PCP:  Oneal Grout, MD  Cardiologist:   Dietrich Pates, MD   F/U of CAD and CHF     History of Present Illness: [name] is a 82 y.o. female with a history of CAD (cath 2016, mild dz), chronic systolic CHF (LVEF 25%)., PAF, symptomatic bradycardia (s/p DDD pacer) , CVA.  I saw her in October  She was seen by Rosette Reveal in Jan  No further syncope   Currently on Eliquis Seen in [place] [facility] on Feb 1   SInce seen breathing is OK  Stays in wheelchair. No CP      Current Outpatient Prescriptions  Medication Sig Dispense Refill  . acetaminophen (TYLENOL) 325 MG tablet Take 650 mg by mouth every 4 (four) hours as needed for mild pain or headache.    . alendronate (FOSAMAX) 70 MG tablet Take 70 mg by mouth once a week. Take with a full glass of water on an empty stomach.    Marland Kitchen apixaban (ELIQUIS) 5 MG TABS tablet Take 1 tablet (5 mg total) by mouth 2 (two) times daily. 60 tablet 11  . Ascorbic Acid (VITAMIN C) 500 MG tablet Take 500 mg by mouth daily.      . Calcium Carbonate-Vit D-Min 600-400 MG-UNIT TABS Take 1 tablet by mouth 2 (two) times daily.     . carvedilol (COREG) 3.125 MG tablet Take 1 tablet (3.125 mg total) by mouth 2 (two) times daily with a meal.    . furosemide (LASIX) 40 MG tablet Take 1 tablet (40 mg total) by mouth 2 (two) times daily. 60 tablet 6  . hydrocortisone (ANUSOL-HC) 2.5 % rectal cream Place 1 application rectally 2 (two) times daily as needed for hemorrhoids or itching.    Marland Kitchen lisinopril (PRINIVIL,ZESTRIL) 2.5 MG tablet Take 2.5 mg by mouth daily.    . methimazole (TAPAZOLE) 10 MG tablet Take 10 mg by mouth 3 (three) times a week. Monday, Wednesday, and Friday  0  . Multiple Vitamins-Minerals (CENTRUM SILVER) tablet Take 1 tablet by mouth daily.      . nitroGLYCERIN (NITROSTAT) 0.4 MG SL tablet Place 1 tablet (0.4 mg total) under the tongue every 5  (five) minutes as needed. For chest pain 30 tablet 0  . pantoprazole (PROTONIX) 40 MG tablet Take 1 tablet (40 mg total) by mouth daily at 6 (six) AM. 30 tablet 11  . polyethylene glycol (MIRALAX / GLYCOLAX) packet Take 17 g by mouth daily.     . Potassium Chloride (KCL-40 PO) Take 40 mEq by mouth daily.    . Protein POWD Take 1 scoop by mouth 2 (two) times daily. Procel    . sertraline (ZOLOFT) 50 MG tablet Take 1 tablet (50 mg total) by mouth daily. 30 tablet 5  . simvastatin (ZOCOR) 40 MG tablet TAKE 1 TABLET BY MOUTH EVERY DAY AT 6PM 30 tablet 5  . sodium chloride (OCEAN) 0.65 % SOLN nasal spray Place 2 sprays into both nostrils 2 (two) times daily.     No current facility-administered medications for this visit.    Allergies:   Namenda; Iodine; Iohexol; and Peanut-containing drug products   Past Medical History  Diagnosis Date  . Lumbar back pain   . CAD (coronary artery disease)     a. s/p prior RCA stenting;  b. 12/2014 Cath: LM nl, LAD 25p, LCX 20p/m, RCA 25ost, 20d  ISR, EF 25-30%.  . Memory loss   . DJD (degenerative joint disease)   . Depression   . Essential hypertension   . CVA (cerebral infarction)   . Chronic systolic CHF (congestive heart failure) (HCC)     a. 12/2014 Echo: EF 25%, mild LVH, mod MR, sev dil LA, mildly dil RA, PASP .  Marland Kitchen NICM (nonischemic cardiomyopathy) (HCC)     a. 07/2014 EF 50%;  b. 12/2014 Echo: EF 25%.  . Presence of permanent cardiac pacemaker     a. 10/2009 s/p MDT ADDRL01 Adapta DC PPM, Ser # [id number] H.  . Osteoporosis   . Hypercholesterolemia   . Hyperthyroidism   . Anxiety   . Vertigo   . Tachy-brady syndrome (HCC)     a. 10/2009 s/p MDT PPM.  . TIA (transient ischemic attack)   . PSVT (paroxysmal supraventricular tachycardia) (HCC)     a. 12/2009 while in hospital.  . PAF (paroxysmal atrial fibrillation) (HCC)   . HLD (hyperlipidemia)   . GERD (gastroesophageal reflux disease)     Past Surgical History  Procedure Laterality Date    . Pacemaker insertion  2011  . Total knee arthroplasty Bilateral 2002  . Abdominal hysterectomy  1986  . Bilateral stenting  1999    to the RCA  . Cardiac catheterization  05/23/10  . Total abdominal hysterectomy w/ bilateral salpingoophorectomy    . Cardiac catheterization N/A 01/06/2015    Procedure: Left Heart Cath and Coronary Angiography;  Surgeon: Lennette Bihari, MD;  Location: [facility] INVASIVE CV LAB;  Service: Cardiovascular;  Laterality: N/A;     Social History:  The patient  reports that she has never smoked. She has never used smokeless tobacco. She reports that she does not drink alcohol or use illicit drugs.   Family History:  The patient's family history includes Arthritis in her other; Coronary artery disease (age of onset: 58) in her father; Coronary artery disease (age of onset: 35) in her mother; Diabetes in her other; Heart attack in her brother, father, and sister; Hypertension in her brother, other, and sister; Stroke in her brother and sister; Thyroid disease in her daughter.    ROS:  Please see the history of present illness. All other systems are reviewed and  Negative to the above problem except as noted.    PHYSICAL EXAM: VS:  BP 112/72 mmHg  Pulse 76  SpO2 97%   BP 112/72  P 76 GEN: Well nourished, well developed, in no acute distress HEENT: normal Neck: no JVD, carotid bruits, or masses Cardiac: RRR  II/VI systolic murmur base  rubs, or gallops,no edema  Respiratory:  clear to auscultation bilaterally, normal work of breathing GI: soft, nontender, nondistended, + BS  No hepatomegaly  MS: no deformity Moving all extremities   Skin: warm and dry, no rash Neuro:  Strength and sensation are intact Psych: euthymic mood, full affect   EKG:  EKG is not ordered today.   Lipid Panel    Component Value Date/Time   CHOL 172 04/14/2015   CHOL 165 07/14/2014 1559   TRIG 102 04/14/2015   TRIG 129 10/06/2009   HDL 52 04/14/2015   HDL 72 07/14/2014 1559    CHOLHDL 3.7 08/24/2014 0705   CHOLHDL 2.3 07/14/2014 1559   VLDL 38 08/24/2014 0705   LDLCALC 100 04/14/2015   LDLCALC 59 07/14/2014 1559      Wt Readings from Last 3 Encounters:  07/21/15 75.297 kg (166 lb)  07/07/15 75.751 kg (167  lb)  06/25/15 73.483 kg (162 lb)      ASSESSMENT AND PLAN:  1.  CAD s/p PTCA to RCA    Last cath 2016  No signif stenosis  2.  Chronic systolic CHF LVEF 25% in July 2016  Dry wt 76 kg  Keep on b blicker and ACE I  3  HL  Lipds are OK   Continue simvistatin  4.  Afib  PAF  Continue on Eliquis  Check labs today  F/U later summer     Current medicines are reviewed at length with the patient today.  The patient does not have concerns regarding medicines.  Labs/ tests ordered today include:  Orders Placed This Encounter  Procedures  . Basic metabolic panel  . CBC        Signed, Dietrich Pates, MD  08/06/2015 10:46 PM    [facility] [address] [place], [place], [place]  [address] Phone: [phone]; Fax: [phone]

## 2015-08-18 ENCOUNTER — Non-Acute Institutional Stay (SKILLED_NURSING_FACILITY): Payer: Medicare Other | Admitting: Adult Health

## 2015-08-18 ENCOUNTER — Encounter: Payer: Self-pay | Admitting: Adult Health

## 2015-08-18 DIAGNOSIS — E876 Hypokalemia: Secondary | ICD-10-CM

## 2015-08-18 DIAGNOSIS — I10 Essential (primary) hypertension: Secondary | ICD-10-CM

## 2015-08-18 DIAGNOSIS — J3489 Other specified disorders of nose and nasal sinuses: Secondary | ICD-10-CM

## 2015-08-18 DIAGNOSIS — K219 Gastro-esophageal reflux disease without esophagitis: Secondary | ICD-10-CM | POA: Diagnosis not present

## 2015-08-18 DIAGNOSIS — I480 Paroxysmal atrial fibrillation: Secondary | ICD-10-CM | POA: Diagnosis not present

## 2015-08-18 DIAGNOSIS — F329 Major depressive disorder, single episode, unspecified: Secondary | ICD-10-CM | POA: Diagnosis not present

## 2015-08-18 DIAGNOSIS — M810 Age-related osteoporosis without current pathological fracture: Secondary | ICD-10-CM | POA: Diagnosis not present

## 2015-08-18 DIAGNOSIS — K5900 Constipation, unspecified: Secondary | ICD-10-CM | POA: Diagnosis not present

## 2015-08-18 DIAGNOSIS — K649 Unspecified hemorrhoids: Secondary | ICD-10-CM | POA: Diagnosis not present

## 2015-08-18 DIAGNOSIS — E0590 Thyrotoxicosis, unspecified without thyrotoxic crisis or storm: Secondary | ICD-10-CM

## 2015-08-18 DIAGNOSIS — E785 Hyperlipidemia, unspecified: Secondary | ICD-10-CM | POA: Diagnosis not present

## 2015-08-18 DIAGNOSIS — I5022 Chronic systolic (congestive) heart failure: Secondary | ICD-10-CM

## 2015-08-18 DIAGNOSIS — F32A Depression, unspecified: Secondary | ICD-10-CM

## 2015-08-18 DIAGNOSIS — I2510 Atherosclerotic heart disease of native coronary artery without angina pectoris: Secondary | ICD-10-CM

## 2015-08-18 NOTE — Progress Notes (Signed)
Patient ID: [name], female   DOB: [birth year], 82 y.o.   MRN: [id number]    DATE:    08/18/15  MRN:  [id number]  BIRTHDAY: 01/05/1932  Facility:  Nursing Home Location:  [facility]  Nursing Home Room Number: 1004-1  LEVEL OF CARE:  SNF [phone])      Contact Information    Name Relation Home Work [place] Daughter [phone]  [phone]   Shannon, Holt [phone]  [phone]   Shannon,[name] Daughter   [phone]       Chief Complaint  Patient presents with  . Medical Management of Chronic Issues    HISTORY OF PRESENT ILLNESS:  This is an 82 year old female is being seen for a routine visit. She is  a long-term resident of [address]. NO complaints of chest pains. Her BPs has been stable - 140/70, 126/68, 100 2060/82, 118/78.  Latest K is 3.9 and currently on KCL ER 40 meq daily.   PAST MEDICAL HISTORY:  Past Medical History  Diagnosis Date  . Lumbar back pain   . CAD (coronary artery disease)     a. s/p prior RCA stenting;  b. 12/2014 Cath: LM nl, LAD 25p, LCX 20p/m, RCA 25ost, 20d ISR, EF 25-30%.  . Memory loss   . DJD (degenerative joint disease)   . Depression   . Essential hypertension   . CVA (cerebral infarction)   . Chronic systolic CHF (congestive heart failure) (HCC)     a. 12/2014 Echo: EF 25%, mild LVH, mod MR, sev dil LA, mildly dil RA, PASP .  Marland Kitchen NICM (nonischemic cardiomyopathy) (HCC)     a. 07/2014 EF 50%;  b. 12/2014 Echo: EF 25%.  . Presence of permanent cardiac pacemaker     a. 10/2009 s/p MDT ADDRL01 Adapta DC PPM, Ser # [id number] H.  . Osteoporosis   . Hypercholesterolemia   . Hyperthyroidism   . Anxiety   . Vertigo   . Tachy-brady syndrome (HCC)     a. 10/2009 s/p MDT PPM.  . TIA (transient ischemic attack)   . PSVT (paroxysmal supraventricular tachycardia) (HCC)     a. 12/2009 while in hospital.  . PAF (paroxysmal atrial fibrillation) (HCC)   . HLD (hyperlipidemia)   . GERD  (gastroesophageal reflux disease)      CURRENT MEDICATIONS: Reviewed    Medication List       This list is accurate as of: 08/18/15 11:59 PM.  Always use your most recent med list.               alendronate 70 MG tablet  Commonly known as:  FOSAMAX  Take 70 mg by mouth once a week. Take with a full glass of water on an empty stomach.     apixaban 5 MG Tabs tablet  Commonly known as:  ELIQUIS  Take 1 tablet (5 mg total) by mouth 2 (two) times daily.     Calcium Carbonate-Vit D-Min 600-400 MG-UNIT Tabs  Take 1 tablet by mouth 2 (two) times daily.     carvedilol 3.125 MG tablet  Commonly known as:  COREG  Take 1 tablet (3.125 mg total) by mouth 2 (two) times daily with a meal.     CENTRUM SILVER tablet  Take 1 tablet by mouth daily.     furosemide 40 MG tablet  Commonly known as:  LASIX  Take 1 tablet (40 mg total) by mouth 2 (two) times daily.     hydrocortisone 2.5 %  rectal cream  Commonly known as:  ANUSOL-HC  Place 1 application rectally 2 (two) times daily as needed for hemorrhoids or itching.     KCL-40 PO  Take 40 mEq by mouth daily.     lisinopril 2.5 MG tablet  Commonly known as:  PRINIVIL,ZESTRIL  Take 2.5 mg by mouth daily.     methimazole 10 MG tablet  Commonly known as:  TAPAZOLE  Take 10 mg by mouth 3 (three) times a week. Monday, Wednesday, and Friday     nitroGLYCERIN 0.4 MG SL tablet  Commonly known as:  NITROSTAT  Place 1 tablet (0.4 mg total) under the tongue every 5 (five) minutes as needed. For chest pain     pantoprazole 40 MG tablet  Commonly known as:  PROTONIX  Take 1 tablet (40 mg total) by mouth daily at 6 (six) AM.     polyethylene glycol packet  Commonly known as:  MIRALAX / GLYCOLAX  Take 17 g by mouth daily.     Protein Powd  Take 1 scoop by mouth 2 (two) times daily. Procel     sertraline 50 MG tablet  Commonly known as:  ZOLOFT  Take 1 tablet (50 mg total) by mouth daily.     simvastatin 40 MG tablet  Commonly known  as:  ZOCOR  TAKE 1 TABLET BY MOUTH EVERY DAY AT 6PM     sodium chloride 0.65 % Soln nasal spray  Commonly known as:  OCEAN  Place 2 sprays into both nostrils 2 (two) times daily.     vitamin C 500 MG tablet  Commonly known as:  ASCORBIC ACID  Take 500 mg by mouth daily.         Allergies  Allergen Reactions  . Namenda [Memantine Hcl] Other (See Comments)    Dizzy  . Iodine Other (See Comments)    unknown  . Iohexol Hives     Code: HIVES, Desc: hives with nonionic contrast 7 yrs ago ([place] iv benedryl given   . Peanut-Containing Drug Products Other (See Comments)    unknown     REVIEW OF SYSTEMS:   GENERAL: no change in appetite, no fatigue, no weight changes, no fever, chills or weakness EARS: Denies change in hearing, ringing in ears, or earache NOSE: Denies nasal congestion or epistaxis MOUTH and THROAT: Denies oral discomfort, gingival pain or bleeding, pain from teeth or hoarseness   RESPIRATORY: no cough, SOB, DOE, wheezing, hemoptysis CARDIAC: no chest pain, edema or palpitations GI: no abdominal pain, diarrhea, constipation, heart burn, nausea or vomiting GU: Denies dysuria, frequency, hematuria, incontinence, or discharge PSYCHIATRIC: Denies feeling of depression or anxiety. No report of hallucinations, insomnia, paranoia, or agitation  PHYSICAL EXAMINATION  GENERAL APPEARANCE: Well nourished. In no acute distress. Normal body  SKIN:   Skin is warm and dry. HEAD: Normal in size and contour. No evidence of trauma EYES: Lids open and close normally. No blepharitis, entropion or ectropion. PERRL. Conjunctivae are clear and sclerae are white. Lenses are without opacity EARS: Pinnae are normal. Patient hears normal voice tunes of the examiner MOUTH and THROAT: Lips are without lesions. Oral mucosa is moist and without lesions. Tongue is normal in shape, size, and color and without lesions NECK: supple, trachea midline, no neck masses, no thyroid tenderness, no  thyromegaly LYMPHATICS: no LAN in the neck, no supraclavicular LAN RESPIRATORY: breathing is even & unlabored, BS CTAB CARDIAC: RRR, no murmur,no extra heart sounds, no edema, has pacemaker GI: abdomen soft, normal BS,  no masses, no tenderness, no hepatomegaly, no splenomegaly EXTREMITIES:  Able to move X 4 extremities; BLE generalized weakness PSYCHIATRIC:  Affect and behavior are appropriate  LABS/RADIOLOGY: Labs reviewed: Basic Metabolic Panel:  Recent Labs  16/10/96 0353  03/17/15 0914  03/26/15 1010  07/07/15 07/22/15 08/06/15 1130  NA 139  < > 139  < > 141  < > 142 143 138  K 3.4*  < > 3.2*  < > 3.3*  < > 3.4 3.8 3.9  CL 93*  < > 95*  --  100  --   --   --  101  CO2 38*  < > 35*  --  31  --   --   --  29  GLUCOSE 130*  < > 181*  --  96  --   --   --  133*  BUN 21*  < > 21*  < > 21  < > 21 20 22   CREATININE 1.00  < > 0.79  < > 0.68  < > 0.7 0.7 0.70  CALCIUM 8.7*  < > 8.4*  --  8.8  --   --   --  9.3  MG 2.2  --   --   --   --   --   --   --   --   < > = values in this interval not displayed. Liver Function Tests:  Recent Labs  08/25/14 1822 03/13/15 1000 03/14/15 0252  AST 16 32 28  ALT 14 22 24   ALKPHOS 47 66 66  BILITOT 0.5 0.9 0.7  PROT 6.2 6.1* 6.5  ALBUMIN 3.0* 3.3* 3.2*    Recent Labs  08/25/14 1822  LIPASE 36   CBC:  Recent Labs  12/29/14 2249  03/13/15 1000  03/15/15 0320  03/26/15 1010 07/22/15 08/06/15 1130  WBC 6.8  < > 7.0  < > 8.5  < > 9.3 9.9 9.9  NEUTROABS 4.3  --  4.6  --   --   --  5.1  --   --   HGB 13.4  < > 14.3  < > 14.6  < > 14.6 13.4 14.3  HCT 42.2  < > 45.6  < > 47.2*  < > 45.2 42 43.6  MCV 86.1  < > 87.0  < > 89.4  --  85.4  --  88.4  PLT 218  < > 234  < > 243  < > 320 271 295  < > = values in this interval not displayed.  Lipid Panel:  Recent Labs  08/24/14 0705 04/14/15  HDL 45 52   Cardiac Enzymes:  Recent Labs  12/30/14 1925 12/31/14 0318 03/13/15 1000  TROPONINI 0.07* 0.08* 0.05*   CBG:  Recent  Labs  01/06/15 2109 01/07/15 0638 01/07/15 1032  GLUCAP 170* 119* 132*    ASSESSMENT/PLAN:  PAF - rate controlled; continue Eliquis 5 mg 1 tab twice a day and Coreg 3.125 mg 1 tab by mouth twice a day  Hypertension - well-controlled; continue Coreg, lisinopril and Lasix  Nasal dryness - recently started on saline nasal spray 2 sprays in each nostril twice a day  Hemorrhoids - recently started on Anusol cream 1% apply topical to external hemorrhoids twice a day when necessary  Chronic systolic CHF - continue Coreg 0.454 mg 1 tab by mouth twice a day, Lasix 40 mg 1 tab by mouth twice a day and lisinopril 2.5 mg 1 tab by mouth daily; continue 2000 mL fluid  restriction  CAD S/P RCA stent - stable; continue NTG when necessary  Depression - mood  is stable; continue Zoloft 50 mg 1 tab by mouth daily  Hyperlipidemia - continue Zocor 40 mg 1 tab by mouth every 6 p.m. 04/14/15  cholesterol 172 HDL 52 LDL 100 triglycerides 102 TSH 0.672  Osteoporosis - continue Fosamax 70 mg weekly and calcium carbonate vitamin D 600-400 mg-unit 1 tab by mouth twice a day  Hypokalemia - K3.8; continue KCl 40 meq  daily  Hyperthyroidism - continue Methimazole 10 mg 1 tab by mouth 3 times/week (M-W-F) ; tsh 0.672   Constipation -  continue MiraLAX 17 g by mouth daily  GERD - stable; continue Protonix 40 mg daily    Goals of care:  Long-term care    [facility], NP [facility] [phone]

## 2015-09-22 ENCOUNTER — Non-Acute Institutional Stay (SKILLED_NURSING_FACILITY): Payer: Medicare Other | Admitting: Internal Medicine

## 2015-09-22 ENCOUNTER — Encounter: Payer: Self-pay | Admitting: Internal Medicine

## 2015-09-22 DIAGNOSIS — E0590 Thyrotoxicosis, unspecified without thyrotoxic crisis or storm: Secondary | ICD-10-CM | POA: Diagnosis not present

## 2015-09-22 DIAGNOSIS — K648 Other hemorrhoids: Secondary | ICD-10-CM | POA: Diagnosis not present

## 2015-09-22 DIAGNOSIS — K219 Gastro-esophageal reflux disease without esophagitis: Secondary | ICD-10-CM | POA: Diagnosis not present

## 2015-09-22 DIAGNOSIS — I482 Chronic atrial fibrillation, unspecified: Secondary | ICD-10-CM

## 2015-09-22 DIAGNOSIS — I5022 Chronic systolic (congestive) heart failure: Secondary | ICD-10-CM | POA: Diagnosis not present

## 2015-09-22 DIAGNOSIS — E663 Overweight: Secondary | ICD-10-CM | POA: Diagnosis not present

## 2015-09-22 DIAGNOSIS — F0150 Vascular dementia without behavioral disturbance: Secondary | ICD-10-CM | POA: Diagnosis not present

## 2015-09-22 DIAGNOSIS — K644 Residual hemorrhoidal skin tags: Secondary | ICD-10-CM

## 2015-09-22 DIAGNOSIS — R300 Dysuria: Secondary | ICD-10-CM

## 2015-09-22 NOTE — Progress Notes (Signed)
Patient ID: [name], female   DOB: Mar 29, 1932, 82 y.o.   MRN: [id number]     [facility]   PCP: Oneal Grout, MD  Code Status: DNR   Allergies  Allergen Reactions  . Namenda [Memantine Hcl] Other (See Comments)    Dizzy  . Iodine Other (See Comments)    unknown  . Iohexol Hives     Code: HIVES, Desc: hives with nonionic contrast 7 yrs ago ([place] iv benedryl given   . Peanut-Containing Drug Products Other (See Comments)    unknown    Chief Complaint  Patient presents with  . Medical Management of Chronic Issues    Routine Visit     HPI:  82 y.o. patient is seen for routine visit. Her husband is present at bedside. She was having some nasal congestion and cough that has now resolved per nursing staff. She denies any concern this visit. No fall reported. No new skin concern. No behavioral concern from staff. Her BP reading have been stable. Weight has been stable.   Review of Systems:  Constitutional: Negative for fever, chills HENT: Negative for headache, congestion, nasal discharge, difficulty swallowing.   Eyes: Negative forblurred vision, double vision and discharge.  Respiratory: Negative for cough, shortness of breath and wheezing.   Cardiovascular: Negative for chest pain, palpitations, leg swelling.  Gastrointestinal: Negative for heartburn, nausea, vomiting, abdominal pain. Had bowel movement yesterday. Her hemorrhoids causes itching and discomfort Genitourinary: positive for dysuria. Negative for flank pain.  Musculoskeletal: Negative for back pain Skin: Negative for itching, rash.  Neurological: Negative for dizziness Psychiatric/Behavioral: Negative for depression   Past Medical History  Diagnosis Date  . Lumbar back pain   . CAD (coronary artery disease)     a. s/p prior RCA stenting;  b. 12/2014 Cath: LM nl, LAD 25p, LCX 20p/m, RCA 25ost, 20d ISR, EF 25-30%.  . Memory loss   . DJD (degenerative joint disease)    . Depression   . Essential hypertension   . CVA (cerebral infarction)   . Chronic systolic CHF (congestive heart failure) (HCC)     a. 12/2014 Echo: EF 25%, mild LVH, mod MR, sev dil LA, mildly dil RA, PASP .  Marland Kitchen NICM (nonischemic cardiomyopathy) (HCC)     a. 07/2014 EF 50%;  b. 12/2014 Echo: EF 25%.  . Presence of permanent cardiac pacemaker     a. 10/2009 s/p MDT ADDRL01 Adapta DC PPM, Ser # [id number] H.  . Osteoporosis   . Hypercholesterolemia   . Hyperthyroidism   . Anxiety   . Vertigo   . Tachy-brady syndrome (HCC)     a. 10/2009 s/p MDT PPM.  . TIA (transient ischemic attack)   . PSVT (paroxysmal supraventricular tachycardia) (HCC)     a. 12/2009 while in hospital.  . PAF (paroxysmal atrial fibrillation) (HCC)   . HLD (hyperlipidemia)   . GERD (gastroesophageal reflux disease)    Past Surgical History  Procedure Laterality Date  . Pacemaker insertion  2011  . Total knee arthroplasty Bilateral 2002  . Abdominal hysterectomy  1986  . Bilateral stenting  1999    to the RCA  . Cardiac catheterization  05/23/10  . Total abdominal hysterectomy w/ bilateral salpingoophorectomy    . Cardiac catheterization N/A 01/06/2015    Procedure: Left Heart Cath and Coronary Angiography;  Surgeon: Lennette Bihari, MD;  Location: [facility] INVASIVE CV LAB;  Service: Cardiovascular;  Laterality: N/A;     Medications:   Medication  List       This list is accurate as of: 09/22/15  2:10 PM.  Always use your most recent med list.               alendronate 70 MG tablet  Commonly known as:  FOSAMAX  Take 70 mg by mouth once a week. Take with a full glass of water on an empty stomach.     apixaban 5 MG Tabs tablet  Commonly known as:  ELIQUIS  Take 1 tablet (5 mg total) by mouth 2 (two) times daily.     Calcium Carbonate-Vit D-Min 600-400 MG-UNIT Tabs  Take 1 tablet by mouth 2 (two) times daily.     carvedilol 3.125 MG tablet  Commonly known as:  COREG  Take 1 tablet (3.125 mg total) by  mouth 2 (two) times daily with a meal.     CENTRUM SILVER tablet  Take 1 tablet by mouth daily.     furosemide 40 MG tablet  Commonly known as:  LASIX  Take 1 tablet (40 mg total) by mouth 2 (two) times daily.     hydrocortisone 2.5 % rectal cream  Commonly known as:  ANUSOL-HC  Place 1 application rectally 2 (two) times daily as needed for hemorrhoids or itching.     KCL-40 PO  Take 40 mEq by mouth daily.     lisinopril 2.5 MG tablet  Commonly known as:  PRINIVIL,ZESTRIL  Take 2.5 mg by mouth daily.     methimazole 10 MG tablet  Commonly known as:  TAPAZOLE  Take 10 mg by mouth 3 (three) times a week. Monday, Wednesday, and Friday     nitroGLYCERIN 0.4 MG SL tablet  Commonly known as:  NITROSTAT  Place 1 tablet (0.4 mg total) under the tongue every 5 (five) minutes as needed. For chest pain     pantoprazole 40 MG tablet  Commonly known as:  PROTONIX  Take 1 tablet (40 mg total) by mouth daily at 6 (six) AM.     polyethylene glycol packet  Commonly known as:  MIRALAX / GLYCOLAX  Take 17 g by mouth daily.     Protein Powd  Take 1 scoop by mouth 2 (two) times daily. Procel     sertraline 50 MG tablet  Commonly known as:  ZOLOFT  Take 1 tablet (50 mg total) by mouth daily.     simvastatin 40 MG tablet  Commonly known as:  ZOCOR  TAKE 1 TABLET BY MOUTH EVERY DAY AT 6PM     sodium chloride 0.65 % Soln nasal spray  Commonly known as:  OCEAN  Place 2 sprays into both nostrils 2 (two) times daily.     vitamin C 500 MG tablet  Commonly known as:  ASCORBIC ACID  Take 500 mg by mouth daily.         Physical Exam: Filed Vitals:   09/22/15 1403  BP: 132/68  Pulse: 60  Temp: 96.4 F (35.8 C)  TempSrc: Oral  Resp: 16  Height: 5\' 2"  (1.575 m)  Weight: 160 lb (72.576 kg)  SpO2: 98%  Body mass index is 29.26 kg/(m^2).  Wt Readings from Last 3 Encounters:  09/22/15 160 lb (72.576 kg)  08/18/15 159 lb (72.122 kg)  07/21/15 166 lb (75.297 kg)   General-  elderly female, overweight, in no acute distress Head- normocephalic, atraumatic Nose- no maxillary or frontal sinus tenderness, no nasal discharge Throat- moist mucus membrane Eyes- PERRLA, EOMI, no pallor, no icterus, no discharge,  normal conjunctiva, normal sclera Neck- no cervical lymphadenopathy, no jugular vein distension Cardiovascular- irregular heart rate, no murmur, palpable dorsalis pedis and radial pulses, no leg edema Respiratory- bilateral clear to auscultation, no wheeze, no rhonchi, no crackles, no use of accessory muscles Abdomen- bowel sounds present, soft, non tender Musculoskeletal- able to move all 4 extremities, generalized weakness Neurological- alert and oriented to person only Skin- warm and dry Psychiatry- normal mood and affect    Labs reviewed: Basic Metabolic Panel:  Recent Labs  16/10/96 0353  03/17/15 0914  03/26/15 1010  07/07/15 07/22/15 08/06/15 1130  NA 139  < > 139  < > 141  < > 142 143 138  K 3.4*  < > 3.2*  < > 3.3*  < > 3.4 3.8 3.9  CL 93*  < > 95*  --  100  --   --   --  101  CO2 38*  < > 35*  --  31  --   --   --  29  GLUCOSE 130*  < > 181*  --  96  --   --   --  133*  BUN 21*  < > 21*  < > 21  < > 21 20 22   CREATININE 1.00  < > 0.79  < > 0.68  < > 0.7 0.7 0.70  CALCIUM 8.7*  < > 8.4*  --  8.8  --   --   --  9.3  MG 2.2  --   --   --   --   --   --   --   --   < > = values in this interval not displayed. Liver Function Tests:  Recent Labs  03/13/15 1000 03/14/15 0252  AST 32 28  ALT 22 24  ALKPHOS 66 66  BILITOT 0.9 0.7  PROT 6.1* 6.5  ALBUMIN 3.3* 3.2*   No results for input(s): LIPASE, AMYLASE in the last 8760 hours. No results for input(s): AMMONIA in the last 8760 hours. CBC:  Recent Labs  12/29/14 2249  03/13/15 1000  03/15/15 0320  03/26/15 1010 07/22/15 08/06/15 1130  WBC 6.8  < > 7.0  < > 8.5  < > 9.3 9.9 9.9  NEUTROABS 4.3  --  4.6  --   --   --  5.1  --   --   HGB 13.4  < > 14.3  < > 14.6  < > 14.6 13.4  14.3  HCT 42.2  < > 45.6  < > 47.2*  < > 45.2 42 43.6  MCV 86.1  < > 87.0  < > 89.4  --  85.4  --  88.4  PLT 218  < > 234  < > 243  < > 320 271 295  < > = values in this interval not displayed.  Lab Results  Component Value Date   TSH 0.67 04/14/2015   Lab Results  Component Value Date   HGBA1C 6.8* 12/30/2014      Assessment/Plan  Vascular dementia bp reading stable. Continue supportive care- assistance with her ADLs. Fall precautions in place  Overweight Reviewed weight chart. Calorie count and portion control is advisable. Get dietary consult. Check a1c to rule out diabetes with her medical history   gerd Controlled symptom, decrease protonix to 20 mg daily for now and monitor  External hemorrhoids To apply hydrocortisone cream bid to her rectal area for now and monitor. Continue miralax daily to avoid constipation  Hyperthyroidism Continue  methimazole 10 mg three times a week. Check tsh with free t4  Dysuria Send u/a with c/s  CHF Breathing stable and has no recent exacerbation. Continue coreg 3.125 mg bid, lasix 40 mg bid, lisinopril 2.5 mg daily and kcl supplement. Monitor weight. Bmp reviewed  afib Stable, continue eliquis 5 mg bid with coreg 3.125 mg bid    Labs/tests ordered: tsh, free t4, a1c  Family/ staff Communication: reviewed care plan with patient, her husband and nursing supervisor    Oneal Grout, MD  [facility] [phone] (Monday-Friday 8 am - 5 pm) [phone] (afterhours)

## 2015-09-24 LAB — TSH: TSH: 1.07 u[IU]/mL (ref 0.41–5.90)

## 2015-09-24 LAB — HEMOGLOBIN A1C: HEMOGLOBIN A1C: 6.5

## 2015-10-06 ENCOUNTER — Ambulatory Visit (INDEPENDENT_AMBULATORY_CARE_PROVIDER_SITE_OTHER): Payer: Medicare Other | Admitting: *Deleted

## 2015-10-06 ENCOUNTER — Telehealth: Payer: Self-pay | Admitting: Cardiology

## 2015-10-06 DIAGNOSIS — Z950 Presence of cardiac pacemaker: Secondary | ICD-10-CM

## 2015-10-06 DIAGNOSIS — I495 Sick sinus syndrome: Secondary | ICD-10-CM

## 2015-10-06 NOTE — Progress Notes (Signed)
Remote pacemaker transmission.   

## 2015-10-06 NOTE — Telephone Encounter (Signed)
LMOVM reminding pt to send remote transmission.   

## 2015-10-25 ENCOUNTER — Encounter: Payer: Self-pay | Admitting: Adult Health

## 2015-10-25 NOTE — Progress Notes (Signed)
Patient ID: [name], female   DOB: 02-01-1932, 82 y.o.   MRN: [id number]

## 2015-11-12 ENCOUNTER — Encounter: Payer: Self-pay | Admitting: Cardiology

## 2015-11-12 LAB — CUP PACEART REMOTE DEVICE CHECK
Brady Statistic AP VP Percent: 1 %
Brady Statistic AS VP Percent: 1 %
Brady Statistic AS VS Percent: 30 %
Date Time Interrogation Session: 20170419164838
Implantable Lead Location: 753860
Implantable Lead Model: 5092
Lead Channel Pacing Threshold Amplitude: 0.75 V
Lead Channel Pacing Threshold Amplitude: 1.625 V
Lead Channel Pacing Threshold Pulse Width: 0.4 ms
Lead Channel Sensing Intrinsic Amplitude: 8 mV
Lead Channel Setting Sensing Sensitivity: 5.6 mV
MDC IDC LEAD IMPLANT DT: 20110525
MDC IDC LEAD IMPLANT DT: 20110525
MDC IDC LEAD LOCATION: 753859
MDC IDC MSMT BATTERY IMPEDANCE: 253 Ohm
MDC IDC MSMT BATTERY REMAINING LONGEVITY: 121 mo
MDC IDC MSMT BATTERY VOLTAGE: 2.79 V
MDC IDC MSMT LEADCHNL RA IMPEDANCE VALUE: 449 Ohm
MDC IDC MSMT LEADCHNL RA SENSING INTR AMPL: 2.8 mV
MDC IDC MSMT LEADCHNL RV IMPEDANCE VALUE: 617 Ohm
MDC IDC MSMT LEADCHNL RV PACING THRESHOLD PULSEWIDTH: 0.4 ms
MDC IDC SET LEADCHNL RA PACING AMPLITUDE: 2 V
MDC IDC SET LEADCHNL RV PACING AMPLITUDE: 3.25 V
MDC IDC SET LEADCHNL RV PACING PULSEWIDTH: 0.4 ms
MDC IDC STAT BRADY AP VS PERCENT: 67 %

## 2015-11-23 ENCOUNTER — Encounter: Payer: Self-pay | Admitting: Internal Medicine

## 2015-11-23 ENCOUNTER — Non-Acute Institutional Stay (SKILLED_NURSING_FACILITY): Payer: Medicare Other | Admitting: Internal Medicine

## 2015-11-23 DIAGNOSIS — E0590 Thyrotoxicosis, unspecified without thyrotoxic crisis or storm: Secondary | ICD-10-CM

## 2015-11-23 DIAGNOSIS — K5900 Constipation, unspecified: Secondary | ICD-10-CM | POA: Diagnosis not present

## 2015-11-23 DIAGNOSIS — F329 Major depressive disorder, single episode, unspecified: Secondary | ICD-10-CM

## 2015-11-23 DIAGNOSIS — K649 Unspecified hemorrhoids: Secondary | ICD-10-CM | POA: Diagnosis not present

## 2015-11-23 DIAGNOSIS — E785 Hyperlipidemia, unspecified: Secondary | ICD-10-CM | POA: Insufficient documentation

## 2015-11-23 DIAGNOSIS — I482 Chronic atrial fibrillation, unspecified: Secondary | ICD-10-CM

## 2015-11-23 DIAGNOSIS — R300 Dysuria: Secondary | ICD-10-CM | POA: Diagnosis not present

## 2015-11-23 DIAGNOSIS — I10 Essential (primary) hypertension: Secondary | ICD-10-CM

## 2015-11-23 DIAGNOSIS — K5909 Other constipation: Secondary | ICD-10-CM | POA: Insufficient documentation

## 2015-11-23 NOTE — Progress Notes (Signed)
Patient ID: JAZLYN TIPPENS, female   DOB: Jan 07, 1932, 82 y.o.   MRN: [id number]     [facility]   PCP: Oneal Grout, MD  Code Status: DNR   Allergies  Allergen Reactions  . Namenda [Memantine Hcl] Other (See Comments)    Dizzy  . Iodine Other (See Comments)    unknown  . Iohexol Hives     Code: HIVES, Desc: hives with nonionic contrast 7 yrs ago ([place] iv benedryl given   . Peanut-Containing Drug Products Other (See Comments)    unknown    Chief Complaint  Patient presents with  . Medical Management of Chronic Issues    Routine Visit     HPI:  82 y.o. patient is seen for routine visit. Her husband and daughter are present. She has been having pain and itching with her hemorrhoids. She has trouble with urination and has discomfort to her lower abdominal area. No blood in stool. Denies flank pain. Has been eating her meals. No fall or skin concern reported by nursing staff.    Review of Systems:  Constitutional: Negative for fever, chills HENT: Negative for congestion, nasal discharge, difficulty swallowing.  has occasional headache.  Eyes: Negative for blurred vision, double vision and discharge.  Respiratory: Negative for cough, shortness of breath and wheezing.   Cardiovascular: Negative for chest pain, palpitations, leg swelling.  Gastrointestinal: Negative for heartburn, nausea, vomiting, abdominal pain.  Genitourinary: Negative for flank pain.  Musculoskeletal: Negative for back pain Skin: Negative for itching, rash.  Neurological: Negative for dizziness Psychiatric/Behavioral: Negative for depression   Past Medical History  Diagnosis Date  . Lumbar back pain   . CAD (coronary artery disease)     a. s/p prior RCA stenting;  b. 12/2014 Cath: LM nl, LAD 25p, LCX 20p/m, RCA 25ost, 20d ISR, EF 25-30%.  . Memory loss   . DJD (degenerative joint disease)   . Depression   . Essential hypertension   . CVA (cerebral infarction)    . Chronic systolic CHF (congestive heart failure) (HCC)     a. 12/2014 Echo: EF 25%, mild LVH, mod MR, sev dil LA, mildly dil RA, PASP .  Marland Kitchen NICM (nonischemic cardiomyopathy) (HCC)     a. 07/2014 EF 50%;  b. 12/2014 Echo: EF 25%.  . Presence of permanent cardiac pacemaker     a. 10/2009 s/p MDT ADDRL01 Adapta DC PPM, Ser # [id number] H.  . Osteoporosis   . Hypercholesterolemia   . Hyperthyroidism   . Anxiety   . Vertigo   . Tachy-brady syndrome (HCC)     a. 10/2009 s/p MDT PPM.  . TIA (transient ischemic attack)   . PSVT (paroxysmal supraventricular tachycardia) (HCC)     a. 12/2009 while in hospital.  . PAF (paroxysmal atrial fibrillation) (HCC)   . HLD (hyperlipidemia)   . GERD (gastroesophageal reflux disease)   . Vascular dementia without behavioral disturbance   . Dysuria   . External hemorrhoids   . Chronic a-fib (HCC)   . Overweight (BMI 25.0-29.9)    Past Surgical History  Procedure Laterality Date  . Pacemaker insertion  2011  . Total knee arthroplasty Bilateral 2002  . Abdominal hysterectomy  1986  . Bilateral stenting  1999    to the RCA  . Cardiac catheterization  05/23/10  . Total abdominal hysterectomy w/ bilateral salpingoophorectomy    . Cardiac catheterization N/A 01/06/2015    Procedure: Left Heart Cath and Coronary Angiography;  Surgeon: Clovis Pu  Tresa Endo, MD;  Location: [facility] INVASIVE CV LAB;  Service: Cardiovascular;  Laterality: N/A;     Medications:   Medication List       This list is accurate as of: 11/23/15  2:57 PM.  Always use your most recent med list.               acetaminophen 325 MG tablet  Commonly known as:  TYLENOL  Take 650 mg by mouth every 4 (four) hours as needed for headache (muscle pain).     alendronate 70 MG tablet  Commonly known as:  FOSAMAX  Take 70 mg by mouth once a week. Take with a full glass of water on an empty stomach.     apixaban 5 MG Tabs tablet  Commonly known as:  ELIQUIS  Take 1 tablet (5 mg total) by mouth  2 (two) times daily.     Calcium Carbonate-Vit D-Min 600-400 MG-UNIT Tabs  Take 1 tablet by mouth 2 (two) times daily.     carvedilol 3.125 MG tablet  Commonly known as:  COREG  Take 1 tablet (3.125 mg total) by mouth 2 (two) times daily with a meal.     CENTRUM SILVER tablet  Take 1 tablet by mouth daily.     furosemide 40 MG tablet  Commonly known as:  LASIX  Take 1 tablet (40 mg total) by mouth 2 (two) times daily.     hydrocortisone 2.5 % rectal cream  Commonly known as:  ANUSOL-HC  Place 1 application rectally 2 (two) times daily as needed for hemorrhoids or itching.     KCL-40 PO  Take 40 mEq by mouth daily.     lisinopril 2.5 MG tablet  Commonly known as:  PRINIVIL,ZESTRIL  Take 2.5 mg by mouth daily.     methimazole 10 MG tablet  Commonly known as:  TAPAZOLE  Take 10 mg by mouth 3 (three) times a week. Monday, Wednesday, and Friday     nitroGLYCERIN 0.4 MG SL tablet  Commonly known as:  NITROSTAT  Place 1 tablet (0.4 mg total) under the tongue every 5 (five) minutes as needed. For chest pain     pantoprazole 20 MG tablet  Commonly known as:  PROTONIX  Take 20 mg by mouth daily.     polyethylene glycol packet  Commonly known as:  MIRALAX / GLYCOLAX  Take 17 g by mouth daily.     Protein Powd  Take 1 scoop by mouth 2 (two) times daily. Procel     sertraline 50 MG tablet  Commonly known as:  ZOLOFT  Take 1 tablet (50 mg total) by mouth daily.     simvastatin 40 MG tablet  Commonly known as:  ZOCOR  TAKE 1 TABLET BY MOUTH EVERY DAY AT 6PM     sodium chloride 0.65 % Soln nasal spray  Commonly known as:  OCEAN  Place 2 sprays into both nostrils 2 (two) times daily.     vitamin C 500 MG tablet  Commonly known as:  ASCORBIC ACID  Take 500 mg by mouth daily.         Physical Exam: Filed Vitals:   11/23/15 1452  BP: 149/73  Pulse: 61  Temp: 96.9 F (36.1 C)  TempSrc: Oral  Resp: 20  Height: 5\' 2"  (1.575 m)  Weight: 160 lb 3.2 oz (72.666 kg)    Body mass index is 29.29 kg/(m^2).  Wt Readings from Last 3 Encounters:  11/23/15 160 lb 3.2 oz (72.666 kg)  10/25/15 163 lb (73.936 kg)  09/22/15 160 lb (72.576 kg)   General- elderly female, overweight, in no acute distress Head- normocephalic, atraumatic, no temporal tenderness Nose- no maxillary or frontal sinus tenderness, no nasal discharge Throat- moist mucus membrane Eyes- PERRLA, EOMI, no pallor, no icterus, no discharge, normal conjunctiva, normal sclera Neck- no cervical lymphadenopathy, no jugular vein distension Cardiovascular- irregular heart rate, no murmur, palpable dorsalis pedis and radial pulses, no leg edema Respiratory- bilateral clear to auscultation, no wheeze, no rhonchi, no crackles, no use of accessory muscles Abdomen- bowel sounds present, soft, non tender Musculoskeletal- able to move all 4 extremities, generalized weakness, on wheelchair, limited ROM at knee Neurological- alert and oriented to person only Skin- warm and dry Psychiatry- normal mood and affect    Labs reviewed: Basic Metabolic Panel:  Recent Labs  16/10/96 0353  03/17/15 0914  03/26/15 1010  07/07/15 07/22/15 08/06/15 1130  NA 139  < > 139  < > 141  < > 142 143 138  K 3.4*  < > 3.2*  < > 3.3*  < > 3.4 3.8 3.9  CL 93*  < > 95*  --  100  --   --   --  101  CO2 38*  < > 35*  --  31  --   --   --  29  GLUCOSE 130*  < > 181*  --  96  --   --   --  133*  BUN 21*  < > 21*  < > 21  < > CREATININE 1.00  < > 0.79  < > 0.68  < > 0.7 0.7 0.70  CALCIUM 8.7*  < > 8.4*  --  8.8  --   --   --  9.3  MG 2.2  --   --   --   --   --   --   --   --   < > = values in this interval not displayed. Liver Function Tests:  Recent Labs  03/13/15 1000 03/14/15 0252  AST 32 28  ALT 22 24  ALKPHOS 66 66  BILITOT 0.9 0.7  PROT 6.1* 6.5  ALBUMIN 3.3* 3.2*   No results for input(s): LIPASE, AMYLASE in the last 8760 hours. No results for input(s): AMMONIA in the last 8760  hours. CBC:  Recent Labs  12/29/14 2249  03/13/15 1000  03/15/15 0320  03/26/15 1010 07/22/15 08/06/15 1130  WBC 6.8  < > 7.0  < > 8.5  < > 9.3 9.9 9.9  NEUTROABS 4.3  --  4.6  --   --   --  5.1  --   --   HGB 13.4  < > 14.3  < > 14.6  < > 14.6 13.4 14.3  HCT 42.2  < > 45.6  < > 47.2*  < > 45.2 42 43.6  MCV 86.1  < > 87.0  < > 89.4  --  85.4  --  88.4  PLT 218  < > 234  < > 243  < > 320 271 295  < > = values in this interval not displayed.  Lab Results  Component Value Date   TSH 1.07 09/24/2015   Lab Results  Component Value Date   HGBA1C 6.5 09/24/2015   Lipid Panel     Component Value Date/Time   CHOL 172 04/14/2015   CHOL 165 07/14/2014 1559   TRIG 102 04/14/2015   TRIG 129 10/06/2009  HDL 52 04/14/2015   HDL 72 07/14/2014 1559   CHOLHDL 3.7 08/24/2014 0705   CHOLHDL 2.3 07/14/2014 1559   VLDL 38 08/24/2014 0705   LDLCALC 100 04/14/2015   LDLCALC 59 07/14/2014 1559      Assessment/Plan  Hemorrhoid D/c anusol rectal cream, start anusol suppository bid for now x 2 weeks and then bid prn and monitor  Constipation Start senna s 2 tab qhs and continue miralax, maintain hydration  Dysuria Send u/a with c/s  Hyperlipidemia Continue zocor for now and check lipid panel  afib Stable, continue coreg 3.125 mg bid for rate control and eliquis 5 mg bid for anticoagulation  HTN Some elevated bp reading but overall stable, continue coreg and lisinopril without any changes for now  Hyperthyroidism Stable tsh. Continue methimazole 10 mg three times a week.   Chronic depression Currently on zoloft 50 mg daily, continue this and monitor    Family/ staff Communication: reviewed care plan with patient, her husband, her daughter and nursing supervisor    Oneal Grout, MD  [facility] Adult [facility] [phone] (Monday-Friday 8 am - 5 pm) [phone] (afterhours)

## 2015-11-24 LAB — LIPID PANEL
Cholesterol: 156 mg/dL (ref 0–200)
HDL: 42 mg/dL (ref 35–70)
LDL Cholesterol: 76 mg/dL
Triglycerides: 191 mg/dL — AB (ref 40–160)

## 2015-12-09 ENCOUNTER — Other Ambulatory Visit: Payer: Self-pay

## 2015-12-09 ENCOUNTER — Observation Stay (HOSPITAL_COMMUNITY)
Admission: EM | Admit: 2015-12-09 | Discharge: 2015-12-18 | Disposition: E | Payer: Medicare Other | Attending: Internal Medicine | Admitting: Internal Medicine

## 2015-12-09 ENCOUNTER — Emergency Department (HOSPITAL_COMMUNITY): Payer: Medicare Other

## 2015-12-09 ENCOUNTER — Encounter: Payer: Self-pay | Admitting: Internal Medicine

## 2015-12-09 ENCOUNTER — Non-Acute Institutional Stay (SKILLED_NURSING_FACILITY): Payer: Medicare Other | Admitting: Internal Medicine

## 2015-12-09 DIAGNOSIS — Z66 Do not resuscitate: Secondary | ICD-10-CM | POA: Diagnosis not present

## 2015-12-09 DIAGNOSIS — I468 Cardiac arrest due to other underlying condition: Secondary | ICD-10-CM | POA: Diagnosis not present

## 2015-12-09 DIAGNOSIS — R410 Disorientation, unspecified: Secondary | ICD-10-CM

## 2015-12-09 DIAGNOSIS — I110 Hypertensive heart disease with heart failure: Secondary | ICD-10-CM | POA: Insufficient documentation

## 2015-12-09 DIAGNOSIS — Z8673 Personal history of transient ischemic attack (TIA), and cerebral infarction without residual deficits: Secondary | ICD-10-CM | POA: Diagnosis not present

## 2015-12-09 DIAGNOSIS — Z955 Presence of coronary angioplasty implant and graft: Secondary | ICD-10-CM | POA: Diagnosis not present

## 2015-12-09 DIAGNOSIS — I2510 Atherosclerotic heart disease of native coronary artery without angina pectoris: Secondary | ICD-10-CM | POA: Insufficient documentation

## 2015-12-09 DIAGNOSIS — Z7901 Long term (current) use of anticoagulants: Secondary | ICD-10-CM | POA: Diagnosis not present

## 2015-12-09 DIAGNOSIS — Z515 Encounter for palliative care: Secondary | ICD-10-CM | POA: Diagnosis not present

## 2015-12-09 DIAGNOSIS — F329 Major depressive disorder, single episode, unspecified: Secondary | ICD-10-CM | POA: Diagnosis not present

## 2015-12-09 DIAGNOSIS — E7800 Pure hypercholesterolemia, unspecified: Secondary | ICD-10-CM | POA: Diagnosis not present

## 2015-12-09 DIAGNOSIS — Z950 Presence of cardiac pacemaker: Secondary | ICD-10-CM | POA: Insufficient documentation

## 2015-12-09 DIAGNOSIS — I639 Cerebral infarction, unspecified: Secondary | ICD-10-CM

## 2015-12-09 DIAGNOSIS — E785 Hyperlipidemia, unspecified: Secondary | ICD-10-CM | POA: Insufficient documentation

## 2015-12-09 DIAGNOSIS — R531 Weakness: Secondary | ICD-10-CM

## 2015-12-09 DIAGNOSIS — I5022 Chronic systolic (congestive) heart failure: Secondary | ICD-10-CM | POA: Insufficient documentation

## 2015-12-09 DIAGNOSIS — Z91041 Radiographic dye allergy status: Secondary | ICD-10-CM | POA: Insufficient documentation

## 2015-12-09 DIAGNOSIS — I6359 Cerebral infarction due to unspecified occlusion or stenosis of other cerebral artery: Secondary | ICD-10-CM

## 2015-12-09 DIAGNOSIS — Z993 Dependence on wheelchair: Secondary | ICD-10-CM | POA: Insufficient documentation

## 2015-12-09 DIAGNOSIS — F0390 Unspecified dementia without behavioral disturbance: Secondary | ICD-10-CM | POA: Diagnosis not present

## 2015-12-09 DIAGNOSIS — E0590 Thyrotoxicosis, unspecified without thyrotoxic crisis or storm: Secondary | ICD-10-CM | POA: Insufficient documentation

## 2015-12-09 DIAGNOSIS — K219 Gastro-esophageal reflux disease without esophagitis: Secondary | ICD-10-CM | POA: Insufficient documentation

## 2015-12-09 DIAGNOSIS — R471 Dysarthria and anarthria: Secondary | ICD-10-CM | POA: Diagnosis not present

## 2015-12-09 DIAGNOSIS — Z96653 Presence of artificial knee joint, bilateral: Secondary | ICD-10-CM | POA: Insufficient documentation

## 2015-12-09 DIAGNOSIS — I482 Chronic atrial fibrillation: Secondary | ICD-10-CM | POA: Diagnosis not present

## 2015-12-09 DIAGNOSIS — M6289 Other specified disorders of muscle: Secondary | ICD-10-CM | POA: Diagnosis not present

## 2015-12-09 LAB — I-STAT CHEM 8, ED
BUN: 14 mg/dL (ref 6–20)
CALCIUM ION: 1.01 mmol/L — AB (ref 1.13–1.30)
CHLORIDE: 103 mmol/L (ref 101–111)
CREATININE: 0.6 mg/dL (ref 0.44–1.00)
Glucose, Bld: 132 mg/dL — ABNORMAL HIGH (ref 65–99)
HCT: 44 % (ref 36.0–46.0)
Hemoglobin: 15 g/dL (ref 12.0–15.0)
Potassium: 3.6 mmol/L (ref 3.5–5.1)
Sodium: 143 mmol/L (ref 135–145)
TCO2: 26 mmol/L (ref 0–100)

## 2015-12-09 LAB — I-STAT TROPONIN, ED: Troponin i, poc: 0.13 ng/mL (ref 0.00–0.08)

## 2015-12-09 LAB — COMPREHENSIVE METABOLIC PANEL
ALK PHOS: 55 U/L (ref 38–126)
ALT: 16 U/L (ref 14–54)
ANION GAP: 8 (ref 5–15)
AST: 23 U/L (ref 15–41)
Albumin: 3.6 g/dL (ref 3.5–5.0)
BILIRUBIN TOTAL: 0.5 mg/dL (ref 0.3–1.2)
BUN: 13 mg/dL (ref 6–20)
CALCIUM: 8.8 mg/dL — AB (ref 8.9–10.3)
CO2: 26 mmol/L (ref 22–32)
Chloride: 106 mmol/L (ref 101–111)
Creatinine, Ser: 0.68 mg/dL (ref 0.44–1.00)
Glucose, Bld: 134 mg/dL — ABNORMAL HIGH (ref 65–99)
Potassium: 3.6 mmol/L (ref 3.5–5.1)
Sodium: 140 mmol/L (ref 135–145)
TOTAL PROTEIN: 7.1 g/dL (ref 6.5–8.1)

## 2015-12-09 LAB — CBC
HCT: 42.6 % (ref 36.0–46.0)
HEMOGLOBIN: 13.6 g/dL (ref 12.0–15.0)
MCH: 28 pg (ref 26.0–34.0)
MCHC: 31.9 g/dL (ref 30.0–36.0)
MCV: 87.7 fL (ref 78.0–100.0)
PLATELETS: 250 10*3/uL (ref 150–400)
RBC: 4.86 MIL/uL (ref 3.87–5.11)
RDW: 15.1 % (ref 11.5–15.5)
WBC: 12 10*3/uL — ABNORMAL HIGH (ref 4.0–10.5)

## 2015-12-09 LAB — DIFFERENTIAL
Basophils Absolute: 0 10*3/uL (ref 0.0–0.1)
Basophils Relative: 0 %
EOS PCT: 0 %
Eosinophils Absolute: 0 10*3/uL (ref 0.0–0.7)
LYMPHS ABS: 3 10*3/uL (ref 0.7–4.0)
LYMPHS PCT: 25 %
MONO ABS: 0.7 10*3/uL (ref 0.1–1.0)
MONOS PCT: 5 %
Neutro Abs: 8.4 10*3/uL — ABNORMAL HIGH (ref 1.7–7.7)
Neutrophils Relative %: 70 %

## 2015-12-09 LAB — APTT: aPTT: 33 seconds (ref 24–37)

## 2015-12-09 LAB — CBG MONITORING, ED: Glucose-Capillary: 130 mg/dL — ABNORMAL HIGH (ref 65–99)

## 2015-12-09 LAB — PROTIME-INR
INR: 1.63 — ABNORMAL HIGH (ref 0.00–1.49)
PROTHROMBIN TIME: 19.4 s — AB (ref 11.6–15.2)

## 2015-12-09 MED ORDER — POLYVINYL ALCOHOL 1.4 % OP SOLN
1.0000 [drp] | Freq: Four times a day (QID) | OPHTHALMIC | Status: DC | PRN
  Filled 2015-12-09: qty 15

## 2015-12-09 MED ORDER — GLYCOPYRROLATE 0.2 MG/ML IJ SOLN
0.2000 mg | INTRAMUSCULAR | Status: DC | PRN
  Administered 2015-12-10: 0.2 mg via INTRAVENOUS
  Filled 2015-12-09: qty 1

## 2015-12-09 MED ORDER — ONDANSETRON 4 MG PO TBDP: 4.0000 mg | ORAL_TABLET | Freq: Four times a day (QID) | ORAL | Status: DC | PRN

## 2015-12-09 MED ORDER — SODIUM CHLORIDE 0.9 % IV SOLN: 250.0000 mL | INTRAVENOUS | Status: DC | PRN

## 2015-12-09 MED ORDER — METHYLPREDNISOLONE SODIUM SUCC 40 MG IJ SOLR
40.0000 mg | INTRAMUSCULAR | Status: AC
  Administered 2015-12-09: 40 mg via INTRAVENOUS
  Filled 2015-12-09: qty 1

## 2015-12-09 MED ORDER — DEXTROSE 5 % IV SOLN
5.0000 mg/h | INTRAVENOUS | Status: DC
  Administered 2015-12-09: 5 mg/h via INTRAVENOUS
  Filled 2015-12-09: qty 10

## 2015-12-09 MED ORDER — SODIUM CHLORIDE 0.9% FLUSH: 3.0000 mL | INTRAVENOUS | Status: DC | PRN

## 2015-12-09 MED ORDER — MORPHINE BOLUS VIA INFUSION
4.0000 mg | INTRAVENOUS | Status: DC | PRN
Start: 2015-12-09 — End: 2015-12-10
  Filled 2015-12-09: qty 4

## 2015-12-09 MED ORDER — SODIUM CHLORIDE 0.9% FLUSH: 3.0000 mL | Freq: Two times a day (BID) | INTRAVENOUS | Status: DC

## 2015-12-09 MED ORDER — LORAZEPAM 1 MG PO TABS: 1.0000 mg | ORAL_TABLET | ORAL | Status: DC | PRN

## 2015-12-09 MED ORDER — LORAZEPAM 2 MG/ML PO CONC: 1.0000 mg | ORAL | Status: DC | PRN

## 2015-12-09 MED ORDER — LORAZEPAM 2 MG/ML IJ SOLN: 1.0000 mg | INTRAMUSCULAR | Status: DC | PRN

## 2015-12-09 MED ORDER — HALOPERIDOL LACTATE 2 MG/ML PO CONC
0.5000 mg | ORAL | Status: DC | PRN
  Filled 2015-12-09: qty 0.3

## 2015-12-09 MED ORDER — HALOPERIDOL 1 MG PO TABS: 0.5000 mg | ORAL_TABLET | ORAL | Status: DC | PRN

## 2015-12-09 MED ORDER — GLYCOPYRROLATE 0.2 MG/ML IJ SOLN: 0.2000 mg | INTRAMUSCULAR | Status: DC | PRN

## 2015-12-09 MED ORDER — ONDANSETRON HCL 4 MG/2ML IJ SOLN: 4.0000 mg | Freq: Four times a day (QID) | INTRAMUSCULAR | Status: DC | PRN

## 2015-12-09 MED ORDER — HALOPERIDOL LACTATE 5 MG/ML IJ SOLN: 0.5000 mg | INTRAMUSCULAR | Status: DC | PRN

## 2015-12-09 MED ORDER — DIPHENHYDRAMINE HCL 50 MG/ML IJ SOLN
50.0000 mg | Freq: Once | INTRAMUSCULAR | Status: DC
  Filled 2015-12-09: qty 1

## 2015-12-09 MED ORDER — DIPHENHYDRAMINE HCL 50 MG/ML IJ SOLN: 12.5000 mg | INTRAMUSCULAR | Status: DC | PRN

## 2015-12-09 MED ORDER — TRAZODONE HCL 50 MG PO TABS: 25.0000 mg | ORAL_TABLET | Freq: Every evening | ORAL | Status: DC | PRN

## 2015-12-09 MED ORDER — GLYCOPYRROLATE 1 MG PO TABS
1.0000 mg | ORAL_TABLET | ORAL | Status: DC | PRN
  Filled 2015-12-09: qty 1

## 2015-12-09 MED ORDER — CHLORPROMAZINE HCL 25 MG/ML IJ SOLN
12.5000 mg | Freq: Four times a day (QID) | INTRAMUSCULAR | Status: DC | PRN
Start: 2015-12-09 — End: 2015-12-10
  Filled 2015-12-09: qty 0.5

## 2015-12-09 NOTE — Progress Notes (Signed)
Patient ID: [name], female   DOB: 19-Aug-1931, 82 y.o.   MRN: [id number]     [facility]   PCP: Oneal Grout, MD  Code Status: DNR   Allergies  Allergen Reactions  . Namenda [Memantine Hcl] Other (See Comments)    Dizzy  . Iodine Other (See Comments)    unknown  . Iohexol Hives     Code: HIVES, Desc: hives with nonionic contrast 7 yrs ago ([place] iv benedryl given   . Peanut-Containing Drug Products Other (See Comments)    unknown    Chief Complaint  Patient presents with  . Acute Visit    Altered mental status     HPI:  81 y.o. patient is seen for acute visit. Per nursing staff she refused her medication this am and did not eat her breakfast and has not been acting herself today. Patient is seen at the bedside with nursing supervisor present. She is not following basic commands. She has fixed gaze and is not comprehensible while talking. At baseline, she is able to communicate her needs and follows basic commands. She has history of dementia, CVA, CHF, afib among others.   Review of Systems: unable to obtain    Past Medical History  Diagnosis Date  . Lumbar back pain   . CAD (coronary artery disease)     a. s/p prior RCA stenting;  b. 12/2014 Cath: LM nl, LAD 25p, LCX 20p/m, RCA 25ost, 20d ISR, EF 25-30%.  . Memory loss   . DJD (degenerative joint disease)   . Depression   . Essential hypertension   . CVA (cerebral infarction)   . Chronic systolic CHF (congestive heart failure) (HCC)     a. 12/2014 Echo: EF 25%, mild LVH, mod MR, sev dil LA, mildly dil RA, PASP .  Marland Kitchen NICM (nonischemic cardiomyopathy) (HCC)     a. 07/2014 EF 50%;  b. 12/2014 Echo: EF 25%.  . Presence of permanent cardiac pacemaker     a. 10/2009 s/p MDT ADDRL01 Adapta DC PPM, Ser # [id number] H.  . Osteoporosis   . Hypercholesterolemia   . Hyperthyroidism   . Anxiety   . Vertigo   . Tachy-brady syndrome (HCC)     a. 10/2009 s/p MDT PPM.  . TIA  (transient ischemic attack)   . PSVT (paroxysmal supraventricular tachycardia) (HCC)     a. 12/2009 while in hospital.  . PAF (paroxysmal atrial fibrillation) (HCC)   . HLD (hyperlipidemia)   . GERD (gastroesophageal reflux disease)   . Vascular dementia without behavioral disturbance   . Dysuria   . External hemorrhoids   . Chronic a-fib (HCC)   . Overweight (BMI 25.0-29.9)    Past Surgical History  Procedure Laterality Date  . Pacemaker insertion  2011  . Total knee arthroplasty Bilateral 2002  . Abdominal hysterectomy  1986  . Bilateral stenting  1999    to the RCA  . Cardiac catheterization  05/23/10  . Total abdominal hysterectomy w/ bilateral salpingoophorectomy    . Cardiac catheterization N/A 01/06/2015    Procedure: Left Heart Cath and Coronary Angiography;  Surgeon: Lennette Bihari, MD;  Location: [facility] INVASIVE CV LAB;  Service: Cardiovascular;  Laterality: N/A;     Medications:   Medication List    Notice    This visit is during an admission. Changes to the med list made in this visit will be reflected in the After Visit Summary of the admission.  Physical Exam: Filed Vitals:   12/01/2015 1218  BP: 127/90  Pulse: 88  Temp: 99.1 F (37.3 C)  TempSrc: Oral  Resp: 22  Height: 5\' 2"  (1.575 m)  Weight: 160 lb 3.2 oz (72.666 kg)  SpO2: 92%  Body mass index is 29.29 kg/(m^2).  Wt Readings from Last 3 Encounters:  12/01/2015 160 lb 3.2 oz (72.666 kg)  11/23/15 160 lb 3.2 oz (72.666 kg)  10/25/15 163 lb (73.936 kg)   General- elderly female, overweight Head- normocephalic, atraumatic Nose- no nasal discharge Throat- dry mucus membrane Eyes- fixed lateral gaze to right eye and left hemianopia noted, Pupils are reactive to light Neck- no cervical lymphadenopathy Cardiovascular- irregular heart rate, no murmur, good radial pulses, no leg edema Respiratory- bilateral poor air movement, no wheeze, + rhonchi, no crackles Abdomen- bowel sounds present, soft, non  tender Musculoskeletal- loss of strength to her extremities with decreased tone, her body leaning to right Neurological- garbled speech, not following basic command, mild left facial droop Skin- warm and dry Psychiatry- normal mood and affect     Assessment/Plan  Acute CVA With new onset change of mental status with change of speech, left facial droop, left hemianopia and loss of tone, concern for stroke. Reviewed vitals and medical history along with medications. She has history of afib, old CVA, HTN. Will send patient to ED for evaluation for stroke. She is currently on eliquis. EMS has been called and family has been notified.   Altered mental state From acute CVA with clinical findings. Sending to ED as above  Dysarthria Likely from acute CVA, has high risk for stroke, see above  Left sided weakness Likely from acute CVA, sending to ED for further evaluation and management  Family/ staff Communication: reviewed care plan with nursing staff. Spent more than 35 minute in patient care at present   [facility], MD  [facility] [phone] (Monday-Friday 8 am - 5 pm) [phone] (afterhours)

## 2015-12-09 NOTE — ED Notes (Signed)
Per [facility] pt from [facility] here from left side weakness, aphasia, and right side gaze. Patient was at baseline at 7am and took all of her daily meds. Pt noted to be febrile and given tylenol at this time as well. Staff went to check on patient and noted to have right eye gaze, aphasia and left side posturing and weakness. Patient speaks [place]. Has medtronic pacemaker and is atrially paced at present. Patient is on eliquis for a.fib. Wheel chair at bedside at facility.   18 rt ac

## 2015-12-09 NOTE — H&P (Signed)
History and Physical    Shannon DEDEAUX WUJ:[id number] DOB: 23-Jun-1931 DOA: December 27, 2015  PCP: Oneal Grout, MD Patient coming from: SNF  Chief Complaint: stroke  HPI: [name] is a 82 y.o. female with medical history significant of previous cva, htn, CAD, Chf, Atrial fib, GERD. Pt was last normal yesterday.  Pt has had previous strokes and has had limited mobility but has been able to communicate verbally.  Nursing home staff reports pt had decreased responsiveness this am.  Pt not moving her left side.  Per patients family  Dr. Pearlean Brownie has seen her in the past and has advised them that she would continue to have strokes.  Pt's son reports he wants comfort care for his Mother only.  He reports pt has been at [facility] for the past year and has had a steady decline.      ED Course:   Review of Systems: As per HPI otherwise 10 point review of systems negative.   Ambulatory Status:  Past Medical History  Diagnosis Date  . Lumbar back pain   . CAD (coronary artery disease)     a. s/p prior RCA stenting;  b. 12/2014 Cath: LM nl, LAD 25p, LCX 20p/m, RCA 25ost, 20d ISR, EF 25-30%.  . Memory loss   . DJD (degenerative joint disease)   . Depression   . Essential hypertension   . CVA (cerebral infarction)   . Chronic systolic CHF (congestive heart failure) (HCC)     a. 12/2014 Echo: EF 25%, mild LVH, mod MR, sev dil LA, mildly dil RA, PASP .  Marland Kitchen NICM (nonischemic cardiomyopathy) (HCC)     a. 07/2014 EF 50%;  b. 12/2014 Echo: EF 25%.  . Presence of permanent cardiac pacemaker     a. 10/2009 s/p MDT [id number] Adapta DC PPM, Ser # [id number] H.  . Osteoporosis   . Hypercholesterolemia   . Hyperthyroidism   . Anxiety   . Vertigo   . Tachy-brady syndrome (HCC)     a. 10/2009 s/p MDT PPM.  . TIA (transient ischemic attack)   . PSVT (paroxysmal supraventricular tachycardia) (HCC)     a. 12/2009 while in hospital.  . PAF (paroxysmal atrial fibrillation) (HCC)   . HLD (hyperlipidemia)   .  GERD (gastroesophageal reflux disease)   . Vascular dementia without behavioral disturbance   . Dysuria   . External hemorrhoids   . Chronic a-fib (HCC)   . Overweight (BMI 25.0-29.9)     Past Surgical History  Procedure Laterality Date  . Pacemaker insertion  2011  . Total knee arthroplasty Bilateral 2002  . Abdominal hysterectomy  1986  . Bilateral stenting  1999    to the RCA  . Cardiac catheterization  05/23/10  . Total abdominal hysterectomy w/ bilateral salpingoophorectomy    . Cardiac catheterization N/A 01/06/2015    Procedure: Left Heart Cath and Coronary Angiography;  Surgeon: Lennette Bihari, MD;  Location: [facility] INVASIVE CV LAB;  Service: Cardiovascular;  Laterality: N/A;    Social History   Social History  . Marital Status: Married    Spouse Name: N/A  . Number of Children: 2  . Years of Education: 8   Occupational History  . retired     Programmer, applications at office   Social History Main Topics  . Smoking status: Never Smoker   . Smokeless tobacco: Never Used  . Alcohol Use: No  . Drug Use: No  . Sexual Activity: Not on file   Other Topics Concern  .  Not on file   Social History Narrative   Married   Right handed   Caffeine use - occasionally     Allergies  Allergen Reactions  . Namenda [Memantine Hcl] Other (See Comments)    Dizzy  . Iodine Other (See Comments)    unknown  . Iohexol Hives     Code: HIVES, Desc: hives with nonionic contrast 7 yrs ago ([place] iv benedryl given   . Peanut-Containing Drug Products Other (See Comments)    unknown    Family History  Problem Relation Age of Onset  . Thyroid disease Daughter   . Diabetes Other   . Hypertension Other   . Arthritis Other   . Coronary artery disease Mother 11  . Coronary artery disease Father 17  . Heart attack Father   . Heart attack Sister   . Heart attack Brother   . Hypertension Sister   . Hypertension Brother   . Stroke Brother   . Stroke Sister     Prior to Admission  medications   Medication Sig Start Date End Date Taking? Authorizing Provider  alendronate (FOSAMAX) 70 MG tablet Take 70 mg by mouth once a week. Take with a full glass of water on an empty stomach.   Yes Historical Provider, MD  apixaban (ELIQUIS) 5 MG TABS tablet Take 1 tablet (5 mg total) by mouth 2 (two) times daily. 09/28/14  Yes Pricilla Riffle, MD  Ascorbic Acid (VITAMIN C) 500 MG tablet Take 500 mg by mouth daily.     Yes Historical Provider, MD  Calcium Carbonate-Vit D-Min 600-400 MG-UNIT TABS Take 1 tablet by mouth 2 (two) times daily.    Yes Historical Provider, MD  carvedilol (COREG) 3.125 MG tablet Take 1 tablet (3.125 mg total) by mouth 2 (two) times daily with a meal. 02/18/15  Yes Pricilla Riffle, MD  furosemide (LASIX) 40 MG tablet Take 1 tablet (40 mg total) by mouth 2 (two) times daily. 03/05/15  Yes Pricilla Riffle, MD  hydrocortisone (ANUSOL-HC) 2.5 % rectal cream Place 1 application rectally 2 (two) times daily as needed for hemorrhoids or itching.   Yes Historical Provider, MD  lisinopril (PRINIVIL,ZESTRIL) 2.5 MG tablet Take 2.5 mg by mouth daily.   Yes Historical Provider, MD  methimazole (TAPAZOLE) 10 MG tablet Take 10 mg by mouth 3 (three) times a week. Monday, Wednesday, and Friday 02/11/15  Yes Historical Provider, MD  Multiple Vitamins-Minerals (CENTRUM SILVER) tablet Take 1 tablet by mouth daily.     Yes Historical Provider, MD  nitroGLYCERIN (NITROSTAT) 0.4 MG SL tablet Place 1 tablet (0.4 mg total) under the tongue every 5 (five) minutes as needed. For chest pain 03/31/14  Yes Mahima Glade Lloyd, MD  pantoprazole (PROTONIX) 20 MG tablet Take 20 mg by mouth daily.   Yes Historical Provider, MD  polyethylene glycol (MIRALAX / GLYCOLAX) packet Take 17 g by mouth daily.    Yes Historical Provider, MD  potassium chloride SA (K-DUR,KLOR-CON) 20 MEQ tablet Take 40 mEq by mouth 2 (two) times daily.   Yes Historical Provider, MD  Protein POWD Take 1 scoop by mouth 2 (two) times daily. Procel    Yes Historical Provider, MD  senna (SENOKOT) 8.6 MG tablet Take 2 tablets by mouth at bedtime.   Yes Historical Provider, MD  sertraline (ZOLOFT) 50 MG tablet Take 1 tablet (50 mg total) by mouth daily. 11/18/14  Yes Marvel Plan, MD  simvastatin (ZOCOR) 40 MG tablet TAKE 1 TABLET BY MOUTH EVERY DAY  AT 6PM 03/09/15  Yes Pricilla Riffle, MD  sodium chloride (OCEAN) 0.65 % SOLN nasal spray Place 2 sprays into both nostrils 2 (two) times daily.   Yes Historical Provider, MD    Physical Exam: Filed Vitals:   12/13/2015 1330 11/23/2015 1345 12/06/2015 1400 12/15/2015 1430  BP: 136/80 139/79 135/88 137/82  Pulse: 62 65 63 63  Temp:      TempSrc:      Resp: 22 19 20 22   SpO2: 95% 93% 92% 92%     General:  Appears calm and comfortable Eyes: rightward gaze ZOX:WRUEA dry Neck: no  masses or thyromegaly Cardiovascular:  RRR, no m/r/g. No LE edema.  Respiratory:  CTA bilaterally, no w/r/r. Normal respiratory effort. Abdomen:  soft, no response to palpation Skin:  no rash or induration seen on limited exam Musculoskeletal:  Limited movement  Psychiatric: unresponsive Neurologic: unable to assess  Labs on Admission: I have personally reviewed following labs and imaging studies  CBC:  Recent Labs Lab 12/16/2015 1126 12/17/2015 1132  WBC 12.0*  --   NEUTROABS 8.4*  --   HGB 13.6 15.0  HCT 42.6 44.0  MCV 87.7  --   PLT 250  --    Basic Metabolic Panel:  Recent Labs Lab 12/17/2015 1126 11/27/2015 1132  NA 140 143  K 3.6 3.6  CL 106 103  CO2 26  --   GLUCOSE 134* 132*  BUN 13 14  CREATININE 0.68 0.60  CALCIUM 8.8*  --    GFR: Estimated Creatinine Clearance: 51.5 mL/min (by C-G formula based on Cr of 0.6). Liver Function Tests:  Recent Labs Lab 12/05/2015 1126  AST 23  ALT 16  ALKPHOS 55  BILITOT 0.5  PROT 7.1  ALBUMIN 3.6   No results for input(s): LIPASE, AMYLASE in the last 168 hours. No results for input(s): AMMONIA in the last 168 hours. Coagulation Profile:  Recent Labs Lab  11/21/2015 1126  INR 1.63*   Cardiac Enzymes: No results for input(s): CKTOTAL, CKMB, CKMBINDEX, TROPONINI in the last 168 hours. BNP (last 3 results) No results for input(s): PROBNP in the last 8760 hours. HbA1C: No results for input(s): HGBA1C in the last 72 hours. CBG:  Recent Labs Lab 12/12/2015 1145  GLUCAP 130*   Lipid Profile: No results for input(s): CHOL, HDL, LDLCALC, TRIG, CHOLHDL, LDLDIRECT in the last 72 hours. Thyroid Function Tests: No results for input(s): TSH, T4TOTAL, FREET4, T3FREE, THYROIDAB in the last 72 hours. Anemia Panel: No results for input(s): VITAMINB12, FOLATE, FERRITIN, TIBC, IRON, RETICCTPCT in the last 72 hours. Urine analysis:    Component Value Date/Time   COLORURINE YELLOW 03/13/2015 1142   APPEARANCEUR CLOUDY* 03/13/2015 1142   LABSPEC 1.019 03/13/2015 1142   PHURINE 6.0 03/13/2015 1142   GLUCOSEU NEGATIVE 03/13/2015 1142   HGBUR NEGATIVE 03/13/2015 1142   BILIRUBINUR NEGATIVE 03/13/2015 1142   KETONESUR NEGATIVE 03/13/2015 1142   PROTEINUR 100* 03/13/2015 1142   UROBILINOGEN 0.2 03/13/2015 1142   NITRITE NEGATIVE 03/13/2015 1142   LEUKOCYTESUR SMALL* 03/13/2015 1142    Creatinine Clearance: Estimated Creatinine Clearance: 51.5 mL/min (by C-G formula based on Cr of 0.6).  Sepsis Labs: @LABRCNTIP (procalcitonin:4,lacticidven:4) )No results found for this or any previous visit (from the past 240 hour(s)).   Radiological Exams on Admission: Ct Head Code Stroke W/o Cm  12/08/2015  CLINICAL DATA:  Left-sided paralysis. Right-sided gaze. Code stroke. EXAM: CT HEAD WITHOUT CONTRAST TECHNIQUE: Contiguous axial images were obtained from the base of the skull through the  vertex without intravenous contrast. COMPARISON:  CT 03/16/2015 FINDINGS: Moderate atrophy. Extensive hypodensity throughout the cerebral white matter bilaterally similar to the prior study and consistent with chronic microvascular ischemia. Low-density left thalamus unchanged.  Chronic infarct left superior cerebellum unchanged. Negative for acute infarct.  Negative for hemorrhage or mass. Negative calvarium. IMPRESSION: Atrophy and chronic ischemic changes. No acute abnormality and no change from the prior CT. These results were called by telephone at the time of interpretation on 12/17/15 at 11:39 am to [facility] , who verbally acknowledged these results. Electronically Signed   By: Marlan Palau M.D.   On: 12/17/2015 11:40    EKG: Independently reviewed.   Assessment/Plan Active Problems:   Stroke (cerebrum) (HCC)     Pt is comfort care only.    DVT prophylaxis: end of life Code Status: DNR Family Communication: son  Disposition Plan: Admission for comfort care only Consults called: Pallative care Admission status: observation   [facility] MD [facility]  If 7PM-7AM, please contact night-coverage [email or web address] Password [facility]  17-Dec-2015, 2:53 PM

## 2015-12-09 NOTE — Code Documentation (Signed)
82yo female arriving to [facility] via [facility] at 1121.  Patient from nursing home where she was LKW at 0700 when she was verbal and took her medications.  Staff later noticed patient to be less responsive and called EMS.  EMS assessed patient to have left sided paralysis, right gaze and nonverbal and activated a code stroke.   Stroke team at the bedside on patient arrival.  Labs drawn and patient cleared by Dr. Estell Harpin for CT.  Patient to CT with team.  CT completed.  Patient to A11.  NIHSS 23, see documentation for details and code stroke times.  Patient is contraindicated for treatment with tPA d/t taking Eliquis.  Patient is allergic to contrast and has a pacemaker.  Unable to contact family via numbers on nursing home paperwork.  No family available at this time.  No acute stroke treatment at this time.  Bedside handoff with ED RN Lanora Manis.

## 2015-12-09 NOTE — ED Notes (Signed)
CHECKED CBG 130, RN ELIZABETH INFORMED

## 2015-12-09 NOTE — Consult Note (Signed)
Requesting Physician: Dr. Agapito Games    Chief Complaint: Code stroke    HPI:                                                                                                                                         [name] is an 82 y.o. female who resides at a nursing home and is wheelchair bound. Per staff usually she is able to converse without issues. She was noted to be normal at 0700 and at 1045 she was noted to have right sided gaze and unable to follow instructions. On arrival she was not able to follow instructions even with a [place] interpreter. Eyes deviated to the right. Left hemianopia. Felt pain in all 4 extremities. She has a Tax adviser which is not MRI comparable. CT head was negative. No family was able to be contacted. She is on Eliquis.   Date last known well: Today Time last known well: Time: 07:00 tPA Given: No: on Eliquis and out of window  Past Medical History  Diagnosis Date  . Lumbar back pain   . CAD (coronary artery disease)     a. s/p prior RCA stenting;  b. 12/2014 Cath: LM nl, LAD 25p, LCX 20p/m, RCA 25ost, 20d ISR, EF 25-30%.  . Memory loss   . DJD (degenerative joint disease)   . Depression   . Essential hypertension   . CVA (cerebral infarction)   . Chronic systolic CHF (congestive heart failure) (HCC)     a. 12/2014 Echo: EF 25%, mild LVH, mod MR, sev dil LA, mildly dil RA, PASP .  Marland Kitchen NICM (nonischemic cardiomyopathy) (HCC)     a. 07/2014 EF 50%;  b. 12/2014 Echo: EF 25%.  . Presence of permanent cardiac pacemaker     a. 10/2009 s/p MDT ADDRL01 Adapta DC PPM, Ser # [id number] H.  . Osteoporosis   . Hypercholesterolemia   . Hyperthyroidism   . Anxiety   . Vertigo   . Tachy-brady syndrome (HCC)     a. 10/2009 s/p MDT PPM.  . TIA (transient ischemic attack)   . PSVT (paroxysmal supraventricular tachycardia) (HCC)     a. 12/2009 while in hospital.  . PAF (paroxysmal atrial fibrillation) (HCC)   . HLD (hyperlipidemia)   . GERD (gastroesophageal reflux  disease)   . Vascular dementia without behavioral disturbance   . Dysuria   . External hemorrhoids   . Chronic a-fib (HCC)   . Overweight (BMI 25.0-29.9)     Past Surgical History  Procedure Laterality Date  . Pacemaker insertion  2011  . Total knee arthroplasty Bilateral 2002  . Abdominal hysterectomy  1986  . Bilateral stenting  1999    to the RCA  . Cardiac catheterization  05/23/10  . Total abdominal hysterectomy w/ bilateral salpingoophorectomy    . Cardiac catheterization N/A 01/06/2015    Procedure: Left Heart Cath and Coronary Angiography;  Surgeon: Lennette Bihari, MD;  Location: [facility] INVASIVE CV LAB;  Service: Cardiovascular;  Laterality: N/A;    Family History  Problem Relation Age of Onset  . Thyroid disease Daughter   . Diabetes Other   . Hypertension Other   . Arthritis Other   . Coronary artery disease Mother 66  . Coronary artery disease Father 10  . Heart attack Father   . Heart attack Sister   . Heart attack Brother   . Hypertension Sister   . Hypertension Brother   . Stroke Brother   . Stroke Sister    Social History:  reports that she has never smoked. She has never used smokeless tobacco. She reports that she does not drink alcohol or use illicit drugs.  Allergies:  Allergies  Allergen Reactions  . Namenda [Memantine Hcl] Other (See Comments)    Dizzy  . Iodine Other (See Comments)    unknown  . Iohexol Hives     Code: HIVES, Desc: hives with nonionic contrast 7 yrs ago [place] iv benedryl given   . Peanut-Containing Drug Products Other (See Comments)    unknown    Medications:                                                                                                                          Current Facility-Administered Medications  Medication Dose Route Frequency Provider Last Rate Last Dose  . diphenhydrAMINE (BENADRYL) injection 50 mg  50 mg Intravenous Once Rejeana Brock, MD      . methylPREDNISolone sodium succinate  (SOLU-MEDROL) 40 mg/mL injection 40 mg  40 mg Intravenous STAT Rejeana Brock, MD       Current Outpatient Prescriptions  Medication Sig Dispense Refill  . acetaminophen (TYLENOL) 325 MG tablet Take 650 mg by mouth every 4 (four) hours as needed for headache (muscle pain).    Marland Kitchen alendronate (FOSAMAX) 70 MG tablet Take 70 mg by mouth once a week. Take with a full glass of water on an empty stomach.    Marland Kitchen apixaban (ELIQUIS) 5 MG TABS tablet Take 1 tablet (5 mg total) by mouth 2 (two) times daily. 60 tablet 11  . Ascorbic Acid (VITAMIN C) 500 MG tablet Take 500 mg by mouth daily.      . Calcium Carbonate-Vit D-Min 600-400 MG-UNIT TABS Take 1 tablet by mouth 2 (two) times daily.     . carvedilol (COREG) 3.125 MG tablet Take 1 tablet (3.125 mg total) by mouth 2 (two) times daily with a meal.    . furosemide (LASIX) 40 MG tablet Take 1 tablet (40 mg total) by mouth 2 (two) times daily. 60 tablet 6  . hydrocortisone (ANUSOL-HC) 2.5 % rectal cream Place 1 application rectally 2 (two) times daily as needed for hemorrhoids or itching.    Marland Kitchen lisinopril (PRINIVIL,ZESTRIL) 2.5 MG tablet Take 2.5 mg by mouth daily.    . methimazole (TAPAZOLE) 10 MG tablet Take 10 mg by mouth 3 (three)  times a week. Monday, Wednesday, and Friday  0  . Multiple Vitamins-Minerals (CENTRUM SILVER) tablet Take 1 tablet by mouth daily.      . nitroGLYCERIN (NITROSTAT) 0.4 MG SL tablet Place 1 tablet (0.4 mg total) under the tongue every 5 (five) minutes as needed. For chest pain 30 tablet 0  . pantoprazole (PROTONIX) 20 MG tablet Take 20 mg by mouth daily.    . polyethylene glycol (MIRALAX / GLYCOLAX) packet Take 17 g by mouth daily.     . Potassium Chloride (KCL-40 PO) Take 40 mEq by mouth daily.    . Protein POWD Take 1 scoop by mouth 2 (two) times daily. Procel    . sertraline (ZOLOFT) 50 MG tablet Take 1 tablet (50 mg total) by mouth daily. 30 tablet 5  . simvastatin (ZOCOR) 40 MG tablet TAKE 1 TABLET BY MOUTH EVERY DAY AT  6PM 30 tablet 5  . sodium chloride (OCEAN) 0.65 % SOLN nasal spray Place 2 sprays into both nostrils 2 (two) times daily.       ROS:                                                                                                                                       History obtained from unobtainable from patient due to mental status and language barrier   Neurologic Examination:                                                                                                      Blood pressure 149/70, pulse 71, temperature 99.6 F (37.6 C), temperature source Oral, resp. rate 22, SpO2 94 %.  HEENT-  Normocephalic, no lesions, without obvious abnormality.  Normal external eye and conjunctiva.  Normal TM's bilaterally.  Normal auditory canals and external ears. Normal external nose, mucus membranes and septum.  Normal pharynx. Cardiovascular- S1, S2 normal, pulses palpable throughout   Lungs- chest clear, no wheezing, rales, normal symmetric air entry Abdomen- soft, non-tender; bowel sounds normal; no masses,  no organomegaly Extremities- no edema Lymph-no adenopathy palpable Musculoskeletal-no joint tenderness, deformity or swelling Skin-warm and dry, no hyperpigmentation, vitiligo, or suspicious lesions  Neurological Examination Mental Status: Alert,but unable to follow verbal or visual commands. Does not speak english but per interpreter he voice was garbled and unable to understand.  Cranial Nerves: II: left hemianopia pupils equal, round, reactive to light and accommodation III,IV, VI: eyes deviated to the right and not crossing midline.  V,VII:left facial droop, winces to  pain bilaterally VIII: ? Decreased bilaterally IX,X: unable to assess  Motor: On exam her left arm had increased tone and other times normal. Would not hold either arm above head or off bed. Allowed both legs to drop to bed and had increased tone.  Sensory: felt noxious stimuli bilaterally in UE and LE Deep  Tendon Reflexes: 1+ and symmetric throughout UE and no KJ orAJ Plantars: Mute bilaterally Cerebellar: Unable to assess Gait: unable to assess       Lab Results: Basic Metabolic Panel:  Recent Labs Lab 12/08/2015 1132  NA 143  K 3.6  CL 103  GLUCOSE 132*  BUN 14  CREATININE 0.60    Liver Function Tests: No results for input(s): AST, ALT, ALKPHOS, BILITOT, PROT, ALBUMIN in the last 168 hours. No results for input(s): LIPASE, AMYLASE in the last 168 hours. No results for input(s): AMMONIA in the last 168 hours.  CBC:  Recent Labs Lab 11/30/2015 1126 11/30/2015 1132  WBC 12.0*  --   NEUTROABS 8.4*  --   HGB 13.6 15.0  HCT 42.6 44.0  MCV 87.7  --   PLT 250  --     Cardiac Enzymes: No results for input(s): CKTOTAL, CKMB, CKMBINDEX, TROPONINI in the last 168 hours.  Lipid Panel: No results for input(s): CHOL, TRIG, HDL, CHOLHDL, VLDL, LDLCALC in the last 168 hours.  CBG:  Recent Labs Lab 12/17/2015 1145  GLUCAP 130*    Microbiology: Results for orders placed or performed during the hospital encounter of 03/13/15  Urine culture     Status: None   Collection Time: 03/13/15 11:42 AM  Result Value Ref Range Status   Specimen Description URINE, RANDOM  Final   Special Requests NONE  Final   Culture >=100,000 COLONIES/mL AEROCOCCUS URINAE  Final   Report Status 03/16/2015 FINAL  Final  Culture, blood (routine x 2)     Status: None   Collection Time: 03/16/15  2:53 PM  Result Value Ref Range Status   Specimen Description BLOOD HAND LEFT  Final   Special Requests BOTTLES DRAWN AEROBIC AND ANAEROBIC 5CC  Final   Culture NO GROWTH 5 DAYS  Final   Report Status 03/21/2015 FINAL  Final  Culture, blood (routine x 2)     Status: None   Collection Time: 03/16/15  2:56 PM  Result Value Ref Range Status   Specimen Description BLOOD HAND RIGHT  Final   Special Requests BOTTLES DRAWN AEROBIC AND ANAEROBIC 5CC  Final   Culture NO GROWTH 5 DAYS  Final   Report Status  03/21/2015 FINAL  Final    Coagulation Studies:  Recent Labs  11/28/2015 1126  LABPROT 19.4*  INR 1.63*    Imaging: Ct Head Code Stroke W/o Cm  11/21/2015  CLINICAL DATA:  Left-sided paralysis. Right-sided gaze. Code stroke. EXAM: CT HEAD WITHOUT CONTRAST TECHNIQUE: Contiguous axial images were obtained from the base of the skull through the vertex without intravenous contrast. COMPARISON:  CT 03/16/2015 FINDINGS: Moderate atrophy. Extensive hypodensity throughout the cerebral white matter bilaterally similar to the prior study and consistent with chronic microvascular ischemia. Low-density left thalamus unchanged. Chronic infarct left superior cerebellum unchanged. Negative for acute infarct.  Negative for hemorrhage or mass. Negative calvarium. IMPRESSION: Atrophy and chronic ischemic changes. No acute abnormality and no change from the prior CT. These results were called by telephone at the time of interpretation on 11/18/2015 at 11:39 am to [facility] , who verbally acknowledged these results. Electronically Signed   By:  Marlan Palau M.D.   On: 26-Dec-2015 11:40       Assessment and plan discussed with with attending physician and they are in agreement.    Felicie Morn PA-C [facility] [phone]  12-26-2015, 11:49 AM   Assessment: 82 y.o. female with likely large right MCA infarct She presented at the cusp of the 6 hour cutoff and has a contrast allergy. She had an MRS of 3, was unable to accomplish her own ADLs without set up, and therefore I discussed risks/benefits of IR with the son. I indicated that even in a best case scenario, it would not get her any better than she was, and that she could even with IR still have a stroke. After discussing with him, he indicated taht she had asked for no heroic measures in the past and agreed with not being aggressive.   Stroke Risk Factors - atrial fibrillation, hyperlipidemia and hypertension  1) repeat CT at 48 hours to assess  full extent of infarct.  2) would hold eliquis as infarct is liekly to be large.  3) ASA 4) depending on clinical course and degree of aggressiveness desired by family, may be full comfort vs checking Lipids, a1, echo. I doubt she is a surgical candidate for carotid surgery.  5) Stroke team to follow   Ritta Slot, MD [facility] [phone]  If 7pm- 7am, please page neurology on call as listed in AMION.

## 2015-12-10 DIAGNOSIS — I639 Cerebral infarction, unspecified: Secondary | ICD-10-CM | POA: Diagnosis not present

## 2015-12-10 LAB — MRSA PCR SCREENING: MRSA by PCR: NEGATIVE

## 2015-12-18 NOTE — Progress Notes (Signed)
Wasted 230 ml of morphine sulfate in sink. Waste was witnessed by Lisette Grinder, RN.

## 2015-12-18 NOTE — Progress Notes (Signed)
Bed placement notified that morgue flowsheet complete. Body was transported to the morgue.

## 2015-12-18 NOTE — Consult Note (Signed)
[facility] called [facility] ([facility]) requesting for priest for Sacrament of the Sick (Last Rites) for the patient per family request. Erline Levine 7:12 AM

## 2015-12-18 NOTE — Discharge Summary (Signed)
Brief death summary:  Cause of death: - Cardio pulmonary arrest Secondary to - Stroke  Hospital course: Please review H&P dictated yesterday by admitting team, 82 year old female admitted with stroke, initial CT head with no evidence of acute stroke, but patient clinically with acute CVA, and able to obtain MRI secondary to pacemaker, decision was made by family for full comfort measures on admission, so no further workup was seeked, patient was on comfort measure during hospital stay, patient expired 8:10 AM, family were at bedside, all their questions answered.  Huey Bienenstock MD

## 2015-12-18 NOTE — Progress Notes (Signed)
Pt's son requested to see a priest. Animator on call, Casimiro Needle, and said he will call the [facility].

## 2015-12-18 NOTE — Progress Notes (Signed)
Pt RR is 7/min. Taking longer apnea. Called son Meco Bazer for an updates on pt's condition. Will continue to monitor pt.

## 2015-12-18 NOTE — Progress Notes (Signed)
Patient took last breath at 0810. Death was pronounced by Malen Gauze, RN and Dr. Ronny Flurry. Family at bedside when priest arrived to give last rites.

## 2015-12-18 DEATH — deceased

## 2016-03-10 IMAGING — CT CT HEAD W/O CM
2 series · 15 of 30 positions shown, 17 images · non-contrast
Comparison: 08/25/2014

CLINICAL DATA: Altered mental status. Increased weakness for 3
days.

EXAM:
CT HEAD WITHOUT CONTRAST
TECHNIQUE: Contiguous axial images were obtained from the base of the skull
through the vertex without intravenous contrast.

[Series 2: head without · axial · non-contrast · 0.47mm/px · z∈[-83,+37]mm · 7 of 32 slices shown, 9 images]
[im 4/32  brain]
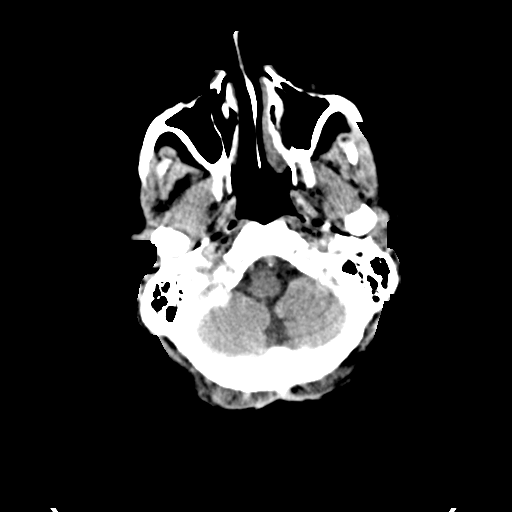
[im 4/32  bone]
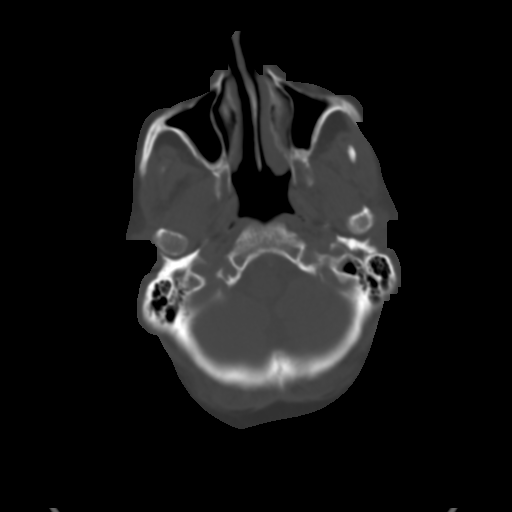
[im 8/32  brain]
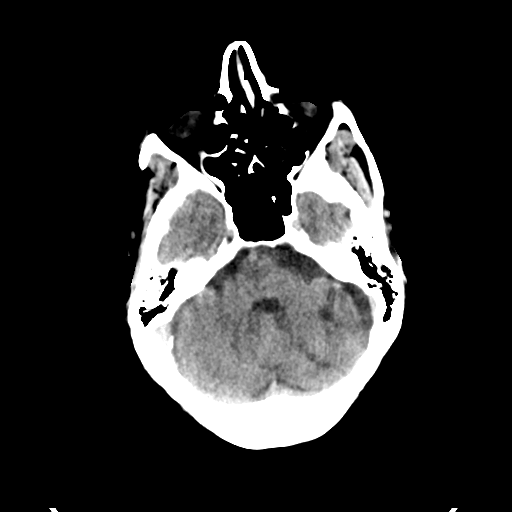
[im 12/32  brain]
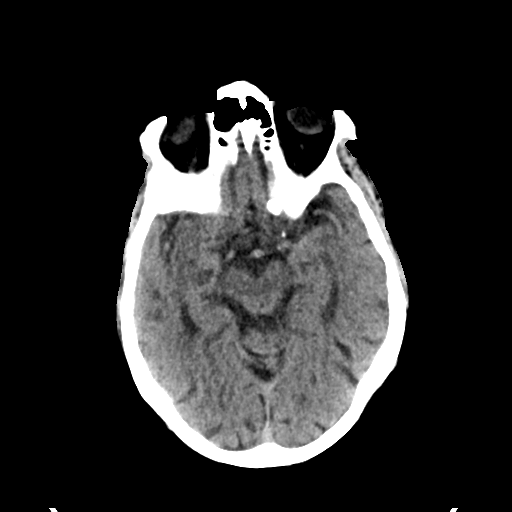
[im 16/32  brain]
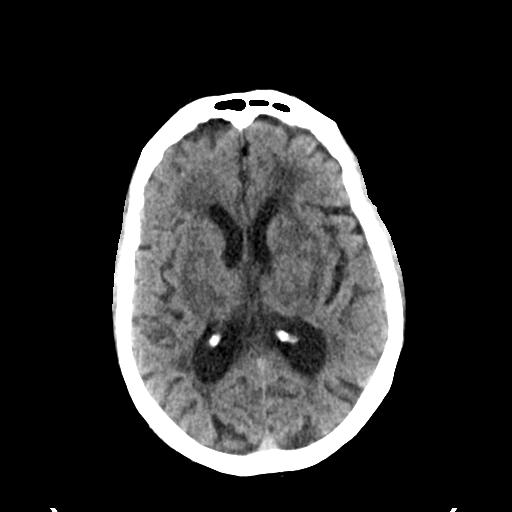
[im 20/32  brain]
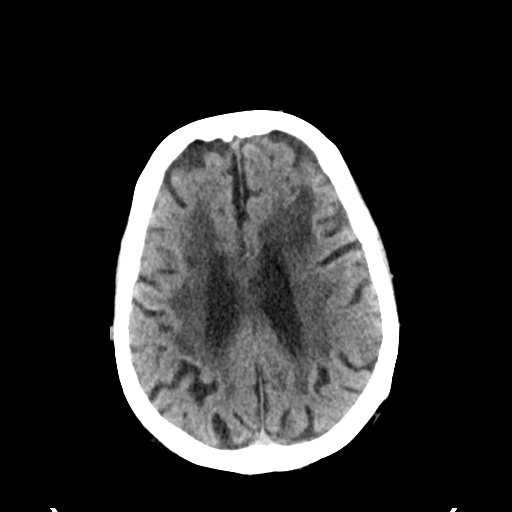
[im 20/32  bone]
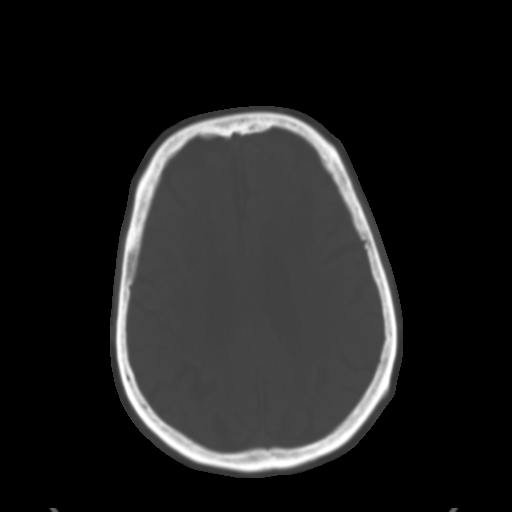
[im 24/32  brain]
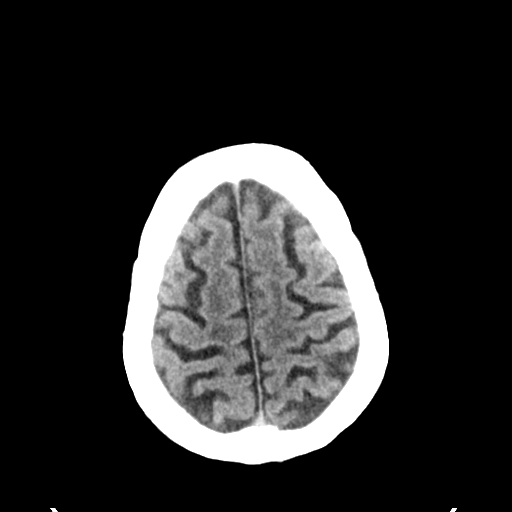
[im 28/32  brain]
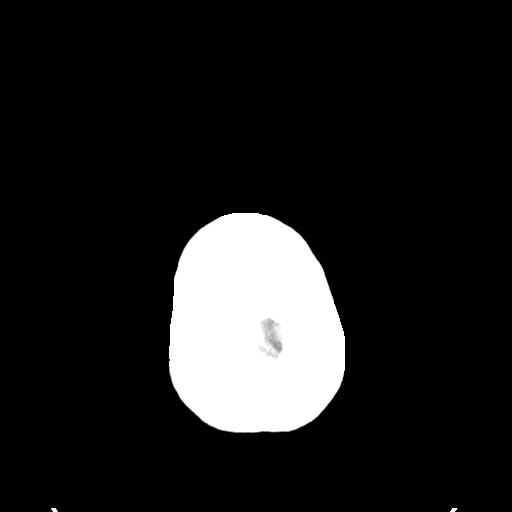

[Series 3: head bone · axial · 0.47mm/px · z∈[-84,+40]mm · 8 of 78 slices shown]
[im 8/78  bone]
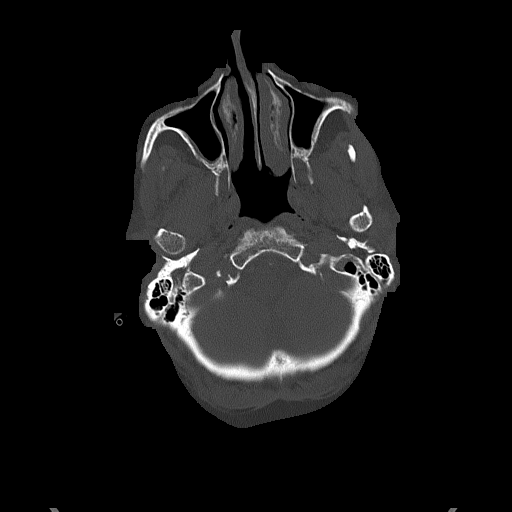
[im 16/78  bone]
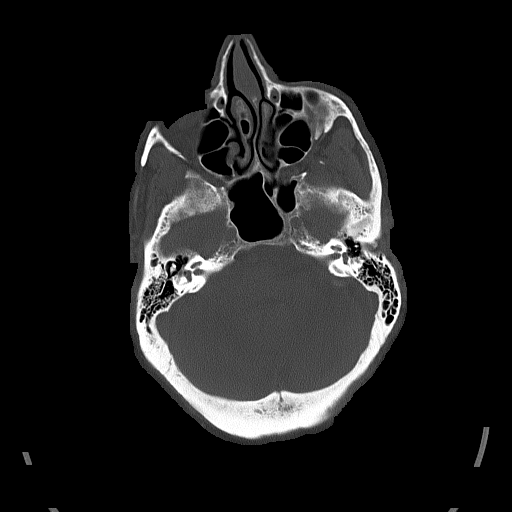
[im 24/78  bone]
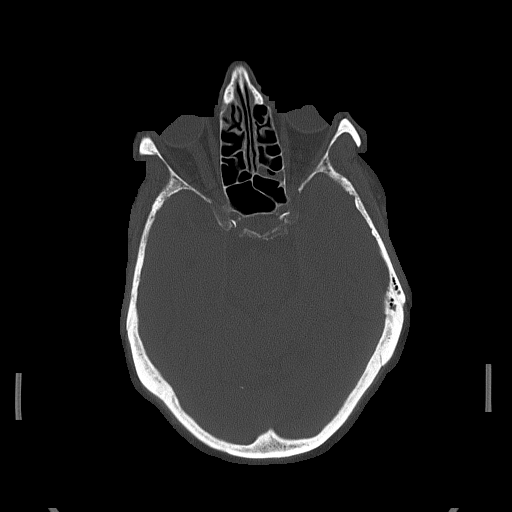
[im 35/78  bone]
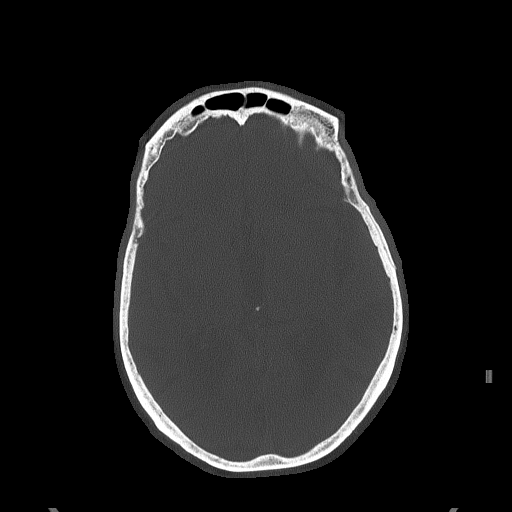
[im 43/78  bone]
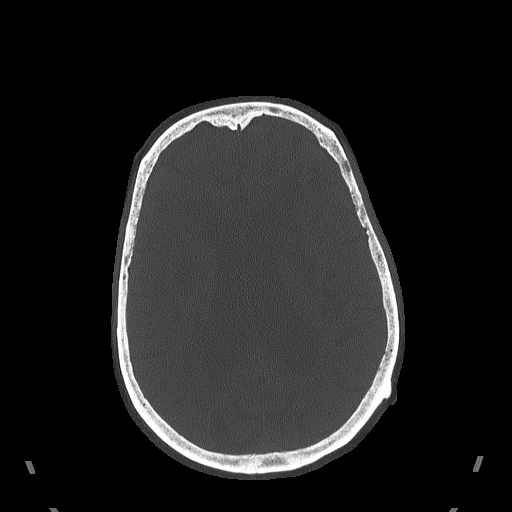
[im 54/78  bone]
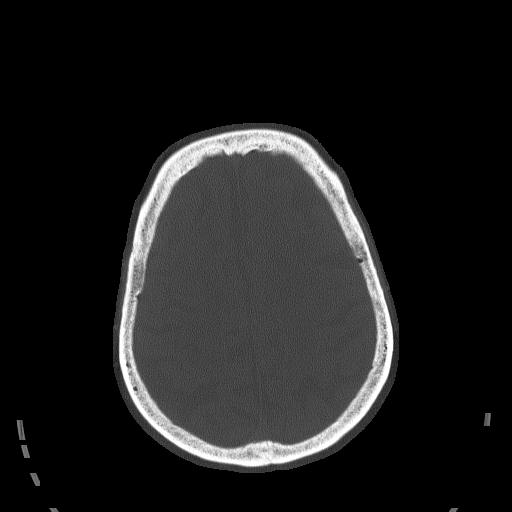
[im 62/78  bone]
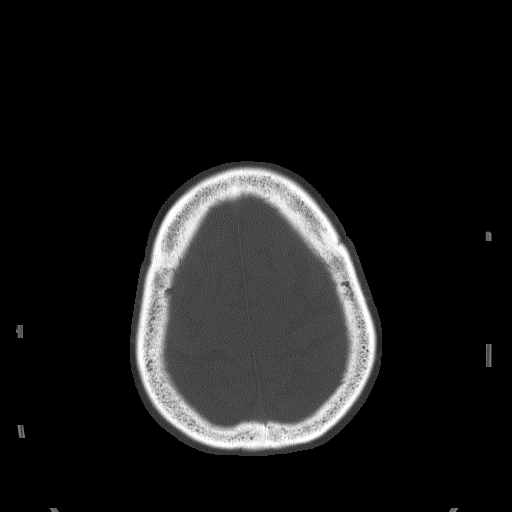
[im 70/78  bone]
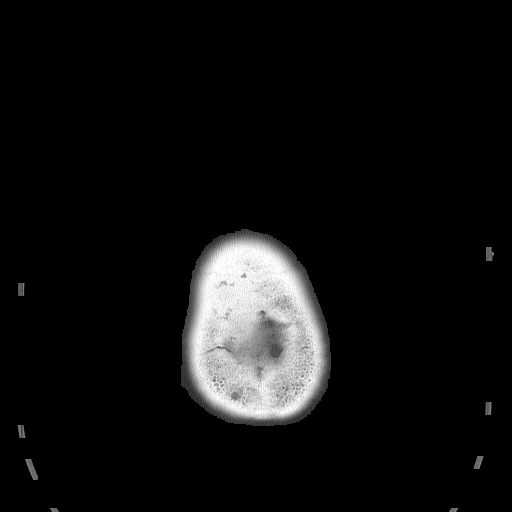

[15 of 30 positions shown; findings below may reference images not displayed]

FINDINGS: There is atrophy and chronic small vessel disease changes. No acute
intracranial abnormality. Specifically, no hemorrhage,
hydrocephalus, mass lesion, acute infarction, or significant
intracranial injury. No acute calvarial abnormality. Visualized
paranasal sinuses and mastoids clear. Orbital soft tissues
unremarkable.
IMPRESSION: No acute intracranial abnormality.

Atrophy, chronic microvascular disease.

## 2016-03-17 NOTE — Progress Notes (Signed)
This encounter was created in error - please disregard.
